# Patient Record
Sex: Female | Born: 1935 | Race: White | Hispanic: No | Smoking: Former smoker
Health system: Southern US, Community
[De-identification: ages and names within clinical notes are randomized; demographics above are authoritative.]

## PROBLEM LIST (undated history)

## (undated) DIAGNOSIS — K469 Unspecified abdominal hernia without obstruction or gangrene: Secondary | ICD-10-CM

## (undated) DIAGNOSIS — E785 Hyperlipidemia, unspecified: Secondary | ICD-10-CM

## (undated) DIAGNOSIS — Z8679 Personal history of other diseases of the circulatory system: Secondary | ICD-10-CM

## (undated) DIAGNOSIS — R0600 Dyspnea, unspecified: Secondary | ICD-10-CM

## (undated) DIAGNOSIS — D649 Anemia, unspecified: Secondary | ICD-10-CM

## (undated) DIAGNOSIS — M810 Age-related osteoporosis without current pathological fracture: Secondary | ICD-10-CM

## (undated) DIAGNOSIS — K219 Gastro-esophageal reflux disease without esophagitis: Secondary | ICD-10-CM

## (undated) DIAGNOSIS — M069 Rheumatoid arthritis, unspecified: Secondary | ICD-10-CM

## (undated) DIAGNOSIS — R269 Unspecified abnormalities of gait and mobility: Secondary | ICD-10-CM

## (undated) DIAGNOSIS — N3020 Other chronic cystitis without hematuria: Secondary | ICD-10-CM

## (undated) DIAGNOSIS — F17200 Nicotine dependence, unspecified, uncomplicated: Secondary | ICD-10-CM

## (undated) DIAGNOSIS — R251 Tremor, unspecified: Secondary | ICD-10-CM

## (undated) DIAGNOSIS — M25529 Pain in unspecified elbow: Secondary | ICD-10-CM

## (undated) DIAGNOSIS — G609 Hereditary and idiopathic neuropathy, unspecified: Secondary | ICD-10-CM

## (undated) DIAGNOSIS — M1990 Unspecified osteoarthritis, unspecified site: Secondary | ICD-10-CM

## (undated) DIAGNOSIS — M21371 Foot drop, right foot: Secondary | ICD-10-CM

## (undated) DIAGNOSIS — M549 Dorsalgia, unspecified: Secondary | ICD-10-CM

## (undated) DIAGNOSIS — I10 Essential (primary) hypertension: Secondary | ICD-10-CM

## (undated) DIAGNOSIS — J449 Chronic obstructive pulmonary disease, unspecified: Secondary | ICD-10-CM

## (undated) DIAGNOSIS — G309 Alzheimer's disease, unspecified: Secondary | ICD-10-CM

## (undated) DIAGNOSIS — G252 Other specified forms of tremor: Principal | ICD-10-CM

## (undated) DIAGNOSIS — I739 Peripheral vascular disease, unspecified: Secondary | ICD-10-CM

## (undated) DIAGNOSIS — J4489 Other specified chronic obstructive pulmonary disease: Secondary | ICD-10-CM

## (undated) DIAGNOSIS — E559 Vitamin D deficiency, unspecified: Secondary | ICD-10-CM

## (undated) DIAGNOSIS — R209 Unspecified disturbances of skin sensation: Secondary | ICD-10-CM

## (undated) DIAGNOSIS — F0280 Dementia in other diseases classified elsewhere without behavioral disturbance: Secondary | ICD-10-CM

## (undated) DIAGNOSIS — R6889 Other general symptoms and signs: Secondary | ICD-10-CM

## (undated) DIAGNOSIS — F419 Anxiety disorder, unspecified: Secondary | ICD-10-CM

## (undated) DIAGNOSIS — G250 Essential tremor: Secondary | ICD-10-CM

## (undated) DIAGNOSIS — F329 Major depressive disorder, single episode, unspecified: Secondary | ICD-10-CM

## (undated) DIAGNOSIS — K649 Unspecified hemorrhoids: Secondary | ICD-10-CM

## (undated) DIAGNOSIS — F32A Depression, unspecified: Secondary | ICD-10-CM

## (undated) DIAGNOSIS — G8929 Other chronic pain: Secondary | ICD-10-CM

## (undated) DIAGNOSIS — M21372 Foot drop, left foot: Secondary | ICD-10-CM

## (undated) HISTORY — DX: Rheumatoid arthritis, unspecified: M06.9

## (undated) HISTORY — DX: Pain in unspecified elbow: M25.529

## (undated) HISTORY — DX: Dorsalgia, unspecified: M54.9

## (undated) HISTORY — DX: Dementia in other diseases classified elsewhere without behavioral disturbance: F02.80

## (undated) HISTORY — PX: TONSILLECTOMY: SUR1361

## (undated) HISTORY — PX: APPENDECTOMY: SHX54

## (undated) HISTORY — DX: Other chronic cystitis without hematuria: N30.20

## (undated) HISTORY — DX: Chronic obstructive pulmonary disease, unspecified: J44.9

## (undated) HISTORY — DX: Unspecified disturbances of skin sensation: R20.9

## (undated) HISTORY — DX: Unspecified abnormalities of gait and mobility: R26.9

## (undated) HISTORY — DX: Foot drop, right foot: M21.371

## (undated) HISTORY — DX: Gastro-esophageal reflux disease without esophagitis: K21.9

## (undated) HISTORY — DX: Unspecified abdominal hernia without obstruction or gangrene: K46.9

## (undated) HISTORY — DX: Other specified forms of tremor: G25.2

## (undated) HISTORY — DX: Nicotine dependence, unspecified, uncomplicated: F17.200

## (undated) HISTORY — DX: Dyspnea, unspecified: R06.00

## (undated) HISTORY — DX: Other general symptoms and signs: R68.89

## (undated) HISTORY — DX: Age-related osteoporosis without current pathological fracture: M81.0

## (undated) HISTORY — DX: Alzheimer's disease, unspecified: G30.9

## (undated) HISTORY — DX: Unspecified osteoarthritis, unspecified site: M19.90

## (undated) HISTORY — PX: INGUINAL HERNIA REPAIR: SUR1180

## (undated) HISTORY — DX: Essential tremor: G25.0

## (undated) HISTORY — DX: Tremor, unspecified: R25.1

## (undated) HISTORY — DX: Foot drop, left foot: M21.372

## (undated) HISTORY — DX: Hereditary and idiopathic neuropathy, unspecified: G60.9

## (undated) HISTORY — DX: Major depressive disorder, single episode, unspecified: F32.9

## (undated) HISTORY — DX: Unspecified hemorrhoids: K64.9

## (undated) HISTORY — PX: BACK SURGERY: SHX140

## (undated) HISTORY — DX: Hyperlipidemia, unspecified: E78.5

## (undated) HISTORY — DX: Personal history of other diseases of the circulatory system: Z86.79

## (undated) HISTORY — PX: OTHER SURGICAL HISTORY: SHX169

## (undated) HISTORY — PX: CATARACT EXTRACTION: SUR2

## (undated) HISTORY — DX: Other specified chronic obstructive pulmonary disease: J44.89

## (undated) HISTORY — DX: Depression, unspecified: F32.A

## (undated) HISTORY — DX: Essential (primary) hypertension: I10

## (undated) HISTORY — DX: Anemia, unspecified: D64.9

## (undated) HISTORY — DX: Anxiety disorder, unspecified: F41.9

## (undated) HISTORY — DX: Other chronic pain: G89.29

## (undated) HISTORY — DX: Vitamin D deficiency, unspecified: E55.9

## (undated) HISTORY — DX: Peripheral vascular disease, unspecified: I73.9

---

## 1998-05-02 ENCOUNTER — Other Ambulatory Visit: Admission: RE | Admit: 1998-05-02 | Discharge: 1998-05-02 | Payer: Self-pay | Admitting: Obstetrics & Gynecology

## 1999-04-11 ENCOUNTER — Other Ambulatory Visit: Admission: RE | Admit: 1999-04-11 | Discharge: 1999-04-11 | Payer: Self-pay | Admitting: Obstetrics & Gynecology

## 1999-08-07 ENCOUNTER — Ambulatory Visit (HOSPITAL_COMMUNITY): Admission: RE | Admit: 1999-08-07 | Discharge: 1999-08-07 | Payer: Self-pay | Admitting: Gastroenterology

## 2000-04-21 ENCOUNTER — Other Ambulatory Visit: Admission: RE | Admit: 2000-04-21 | Discharge: 2000-04-21 | Payer: Self-pay | Admitting: Obstetrics & Gynecology

## 2002-01-08 ENCOUNTER — Encounter: Payer: Self-pay | Admitting: Family Medicine

## 2002-01-08 ENCOUNTER — Encounter: Admission: RE | Admit: 2002-01-08 | Discharge: 2002-01-08 | Payer: Self-pay | Admitting: Family Medicine

## 2002-04-22 ENCOUNTER — Other Ambulatory Visit: Admission: RE | Admit: 2002-04-22 | Discharge: 2002-04-22 | Payer: Self-pay | Admitting: Obstetrics & Gynecology

## 2002-10-26 ENCOUNTER — Encounter: Payer: Self-pay | Admitting: General Surgery

## 2002-10-29 ENCOUNTER — Inpatient Hospital Stay (HOSPITAL_COMMUNITY): Admission: RE | Admit: 2002-10-29 | Discharge: 2002-10-30 | Payer: Self-pay | Admitting: General Surgery

## 2003-10-11 ENCOUNTER — Ambulatory Visit: Admission: RE | Admit: 2003-10-11 | Discharge: 2003-10-11 | Payer: Self-pay

## 2004-05-02 ENCOUNTER — Encounter: Admission: RE | Admit: 2004-05-02 | Discharge: 2004-06-12 | Payer: Self-pay | Admitting: Family Medicine

## 2004-05-11 ENCOUNTER — Other Ambulatory Visit: Admission: RE | Admit: 2004-05-11 | Discharge: 2004-05-11 | Payer: Self-pay | Admitting: Obstetrics & Gynecology

## 2004-06-20 ENCOUNTER — Encounter: Admission: RE | Admit: 2004-06-20 | Discharge: 2004-06-20 | Payer: Self-pay | Admitting: Family Medicine

## 2006-12-28 ENCOUNTER — Other Ambulatory Visit: Admission: RE | Admit: 2006-12-28 | Discharge: 2006-12-28 | Payer: Self-pay | Admitting: Obstetrics and Gynecology

## 2007-07-14 ENCOUNTER — Inpatient Hospital Stay (HOSPITAL_COMMUNITY): Admission: AD | Admit: 2007-07-14 | Discharge: 2007-07-17 | Payer: Self-pay | Admitting: Internal Medicine

## 2007-07-15 ENCOUNTER — Encounter (INDEPENDENT_AMBULATORY_CARE_PROVIDER_SITE_OTHER): Payer: Self-pay | Admitting: Internal Medicine

## 2007-07-15 ENCOUNTER — Ambulatory Visit: Payer: Self-pay | Admitting: Surgery

## 2008-10-26 ENCOUNTER — Encounter: Admission: RE | Admit: 2008-10-26 | Discharge: 2008-10-26 | Payer: Self-pay | Admitting: Family Medicine

## 2008-12-02 ENCOUNTER — Encounter: Admission: RE | Admit: 2008-12-02 | Discharge: 2008-12-02 | Payer: Self-pay | Admitting: Rheumatology

## 2009-06-06 ENCOUNTER — Other Ambulatory Visit: Admission: RE | Admit: 2009-06-06 | Discharge: 2009-06-06 | Payer: Self-pay | Admitting: Obstetrics and Gynecology

## 2009-10-11 ENCOUNTER — Encounter: Payer: Self-pay | Admitting: Internal Medicine

## 2009-11-29 ENCOUNTER — Encounter: Payer: Self-pay | Admitting: Internal Medicine

## 2009-12-12 ENCOUNTER — Encounter: Payer: Self-pay | Admitting: Internal Medicine

## 2010-02-20 ENCOUNTER — Encounter: Payer: Self-pay | Admitting: Internal Medicine

## 2010-03-27 ENCOUNTER — Ambulatory Visit
Admission: RE | Admit: 2010-03-27 | Discharge: 2010-03-27 | Payer: Self-pay | Source: Home / Self Care | Attending: Internal Medicine | Admitting: Internal Medicine

## 2010-03-27 ENCOUNTER — Other Ambulatory Visit: Payer: Self-pay | Admitting: Internal Medicine

## 2010-03-27 DIAGNOSIS — R0602 Shortness of breath: Secondary | ICD-10-CM | POA: Insufficient documentation

## 2010-03-27 LAB — CBC WITH DIFFERENTIAL/PLATELET
Basophils Absolute: 0.1 10*3/uL (ref 0.0–0.1)
Basophils Relative: 0.7 % (ref 0.0–3.0)
Eosinophils Absolute: 0.2 10*3/uL (ref 0.0–0.7)
Eosinophils Relative: 2.4 % (ref 0.0–5.0)
HCT: 41.1 % (ref 36.0–46.0)
Hemoglobin: 14 g/dL (ref 12.0–15.0)
Lymphocytes Relative: 24.5 % (ref 12.0–46.0)
Lymphs Abs: 1.7 10*3/uL (ref 0.7–4.0)
MCHC: 34.1 g/dL (ref 30.0–36.0)
MCV: 91.1 fl (ref 78.0–100.0)
Monocytes Absolute: 0.7 10*3/uL (ref 0.1–1.0)
Monocytes Relative: 9.7 % (ref 3.0–12.0)
Neutro Abs: 4.3 10*3/uL (ref 1.4–7.7)
Neutrophils Relative %: 62.7 % (ref 43.0–77.0)
Platelets: 247 10*3/uL (ref 150.0–400.0)
RBC: 4.52 Mil/uL (ref 3.87–5.11)
RDW: 15.7 % — ABNORMAL HIGH (ref 11.5–14.6)
WBC: 6.8 10*3/uL (ref 4.5–10.5)

## 2010-03-30 ENCOUNTER — Ambulatory Visit
Admission: RE | Admit: 2010-03-30 | Discharge: 2010-03-30 | Payer: Self-pay | Source: Home / Self Care | Attending: Internal Medicine | Admitting: Internal Medicine

## 2010-03-30 ENCOUNTER — Encounter: Payer: Self-pay | Admitting: Internal Medicine

## 2010-03-30 DIAGNOSIS — I2789 Other specified pulmonary heart diseases: Secondary | ICD-10-CM | POA: Insufficient documentation

## 2010-04-05 ENCOUNTER — Telehealth (INDEPENDENT_AMBULATORY_CARE_PROVIDER_SITE_OTHER): Payer: Self-pay | Admitting: *Deleted

## 2010-04-10 ENCOUNTER — Telehealth (INDEPENDENT_AMBULATORY_CARE_PROVIDER_SITE_OTHER): Payer: Self-pay | Admitting: *Deleted

## 2010-04-19 NOTE — Miscellaneous (Signed)
Summary: Orders Update pft charges  Clinical Lists Changes  Orders: Added new Service order of Carbon Monoxide diffusing w/capacity (94720) - Signed Added new Service order of Lung Volumes (94240) - Signed Added new Service order of Spirometry (Pre & Post) (94060) - Signed 

## 2010-04-19 NOTE — Assessment & Plan Note (Signed)
Summary: discuss pft and cxr results//jrc   Visit Type:  Follow-up Copy to:  Dr Juluis Rainier Primary Provider/Referring Provider:  Dr. Juluis Rainier, [employer]  CC:  Pt here to discuss PFT results. .  History of Present Illness: March 27, 2010  IOV for this 77 year old lady. Heavy passive smoker till 2007. c/o dyspnea x since fall 2011. Insidious onset. Progressive initially but last 2-3 months been stable. Dyspnea brought on by "hurrying" or "rush to do anything". For example if she walks brisker than normal. In the office she felt dyspneic after walking 155 feet x 2 laps - she did not desaturate but got tachycardidc to tthe 120s (sinus by pulse).Marland Kitchen Dyspnea relieved by rest. Dyspnea is associated some fain wheeze early in the morning. It is also associated  with "rapid heart beat". She noticed rapid heart beat in Nov 2011.Marland KitchenDyspnea is also associatd with loss of 6 inches of height due to age-osteoprosis. Height was  5'4" but now only 4'8". Dyspnea is rated as mild - moderate. Denies associated chest pain, hemoptysis, weight loss, cough, runny nose, change in edema (known to have varicose veins), orthopnea, paroxysmal nocturnal dyspnea, nausea, vomit, diarrhea.     Review of outside records show that she had ekg reported as sinus rhythm. She  has seen Dr. Eldridge Dace of [facility] and echo reportedly showed some pulmonary hypertension reportedly. She [facility] had [facility] in 2004 at [facility] and per report it was normal. Outside records report a more recent normal CXR of unclear date. Pertinent las she had were BNP 88 in sept 2011 adn normal TSH and creatinine in July 2011. Of note, per echart in 2009 admitted with hgb 6.5gm% due to GI blood loss. Discharge hgb was 9s gm%. States she has not had repepat hgb since then but outside records show hgb 13.8gm% in July 2011; this pre-dated dyspnea onset. REC: CBC, CXR nd FULL PFT   March 30, 2010: here to reviewe test results. No interim new  complatins. Stil dyspneic. No change. Hgb is 14gm%. CXR shows HIATAL HERNIA and SEVERE KYPHOSIS with resultant small lung volumes and left diaphragm elevation. PFTs today are normal   Preventive Screening-Counseling & Management  Alcohol-Tobacco     Smoking Status: never     Passive Smoke Exposure: yes  Current Medications (verified): 1)  Tylenol Arthritis Pain 650 Mg Cr-Tabs (Acetaminophen) .... Take 1 Tablet By Mouth Once A Day 2)  Osteo Bi-Flex Adv Joint Shield  Tabs (Misc Natural Products) .... 2 Tablets Once Daily 3)  Prilosec 20 Mg Cpdr (Omeprazole) .... Take 1 Tablet By Mouth Once A Day 4)  Multivitamins  Tabs (Multiple Vitamin) .... Take 1 Tablet By Mouth Once A Day 5)  Vitamin D 1000 Unit Tabs (Cholecalciferol) .... Take 1 Tablet By Mouth Once A Day 6)  Fish Oil 1000 Mg Caps (Omega-3 Fatty Acids) .... Take 1 Tablet By Mouth Once A Day 7)  Caltrate 600+d Plus 600-400 Mg-Unit Tabs (Calcium Carbonate-Vit D-Min) .... Take 1 Tablet By Mouth Two Times A Day 8)  Hydrocortisone 2.5 % Crea (Hydrocortisone) .... Apply Once A Day 9)  Voltaren 1 % Gel (Diclofenac Sodium) .... As Directed Twice Daily As Needed 10)  Estrace 0.1 Mg/gm Crea (Estradiol) .... One Applicator Monday, Wed, Friday 11)  Hydrochlorothiazide 12.5 Mg Caps (Hydrochlorothiazide) .... Take 1 Tablet By Mouth Once A Day 12)  Zoloft 50 Mg Tabs (Sertraline Hcl) .... Take 1 Tablet By Mouth Once A Day 13)  Vicodin 5-500  Mg Tabs (Hydrocodone-Acetaminophen) .... One Tablet As Needed For Pain Every 6 Hrs  Allergies (verified): 1)  ! * Novocaine  Past History:  Past medical, surgical, family and social histories (including risk factors) reviewed, and no changes noted (except as noted below).  Past Medical History: Reviewed history from 03/27/2010 and no changes required. Hypertension Osteoarthritis Osteoporosis Depression GERD Hyperlipidemia Anemia Anxiety Peripheral vascular disease Hemorrhoids Cerebrovascular  disease esophageal reflux hernia Normal CXR by report 10/26/2002 Varicose vein  - August 2010 doppler negative for both SVT and DVT  Past Surgical History: Reviewed history from 03/26/2010 and no changes required. Appendectomy Total Abdominal Hysterectomy 1995 back surgery x2 has titanium rods in back, 2003, 2007 Right breast lumpectomy-benign 1964, 1975 hernia repair-2004  Past Pulmonary History:  Pulmonary History: DATE OF ADMISSION:  07/14/2007   DATE OF DISCHARGE:  07/17/2007     1. Severe iron-deficiency anemia likely secondary to large hiatal      hernia and Cameron erosions. admit hgb 6.5gm%. DC hgb 9s gm%  2. Right carotid bruit.   3. Hypertension.   4. Diverticulosis.   5. Depression.   6. Osteoporosis.   7. Hyperlipidemia.   8. Degenerative joint disease.   9. Ventral hernia status post repair August 2004.   10.Hemangioma.   11.Chronic back pain.   12.External hemorrhoids.      Family History: Reviewed history from 03/27/2010 and no changes required. Father-deceased 59 yrs MI Mother-deceased Leukemia Brother-1  COPD. Died 01/22/11Sister-1 deceased brain tumor  Social History: Reviewed history from 03/27/2010 and no changes required. Patient never smoked.  Pt lives alone widowed Pt has 3 children, 2 boys and one daughter  - Vivia Budge, [place] ; [place] and [place] Lives in retirement home -- independent living in an apartment  Review of Systems      See HPI       The patient complains of dyspnea on exertion.  The patient denies anorexia, fever, weight loss, weight gain, vision loss, decreased hearing, hoarseness, chest pain, syncope, peripheral edema, prolonged cough, headaches, hemoptysis, abdominal pain, melena, hematochezia, severe indigestion/heartburn, hematuria, incontinence, genital sores, muscle weakness, suspicious skin lesions, transient blindness, difficulty walking, depression, unusual weight change, abnormal bleeding, enlarged lymph  nodes, angioedema, breast masses, and testicular masses.    Vital Signs:  Patient profile:   77 year old female Height:      58.5 inches O2 Sat:      94 % on Room air Temp:     98.2 degrees F oral Pulse rate:   104 / minute BP sitting:   136 / 90  (right arm) Cuff size:   regular  Vitals Entered By: Carron Curie CMA (March 30, 2010 1:24 PM)  O2 Flow:  Room air CC: Pt here to discuss PFT results.  Comments Medications reviewed with patient Carron Curie CMA  March 30, 2010 1:25 PM Daytime phone number verified with patient.    Physical Exam  General:  short,  Head:  normocephalic and atraumatic Eyes:  PERRLA/EOM intact; conjunctiva and sclera clear Ears:  TMs intact and clear with normal canals Nose:  no deformity, discharge, inflammation, or lesions Mouth:  no deformity or lesions Neck:  no masses, thyromegaly, or abnormal cervical nodes Chest Wall:  kyphosis.   Lungs:  clear bilaterally to auscultation and percussion Heart:  regular rate and rhythm, S1, S2 without murmurs, rubs, gallops, or clicks Abdomen:  bowel sounds positive; abdomen soft and non-tender without masses, or organomegaly Msk:  no deformity or  scoliosis noted with normal posture osteoporotic Pulses:  pulses normal Extremities:  no clubbing, cyanosis, edema, or deformity noted Neurologic:  CN II-XII grossly intact with normal reflexes, coordination, muscle strength and tone Skin:  intact without lesions or rashes Cervical Nodes:  no significant adenopathy Axillary Nodes:  no significant adenopathy Psych:  alert and cooperative; normal mood and affect; normal attention span and concentration   MISC. Report  Procedure date:  03/30/2010  Findings:       Hgb is 14gm%. CXR shows HIATAL HERNIA and SEVERE KYPHOSIS with resultant small lung volumes and left diaphragm elevation. PFTs today are normal   Impression & Recommendations:  Problem # 1:  DYSPNEA (ICD-786.05) Assessment  Unchanged PFTs are normal. Walking desaturatin test is normal. Hgb is normal. CXR shows severe kyphosis and hiatral hernia. ECHO shows secondary puml htn. I suspect therefore dyspnea is due to deconditioning, kyphosis, and possible secondary pulmonary hypertension. THere is no specific Rx intervention for this. I strongly advised pulmonary rehab. She has accepted referral but will want to talk to rehab people before she decides to go through rehab or accept dyspnea. She will see me again in 3 months to report progres Orders: Est. Patient Level III (16109) Pulmonary Referral (Pulmonary)  Patient Instructions: 1)  please discuss with rehab when they call you 2)  if you can attend the program there would be great 3)  your shortness of breath is due to loss of height and deconditioning  4)  please d/w Dr. Zachery Dauer about medications for osteoporosis 5)  pleaes return in 3 months to report progress 6)  if any problems before next visit, come sooner

## 2010-04-19 NOTE — Progress Notes (Signed)
Summary: calling about referral for therapy  Phone Note Call from Patient Call back at Home Phone [phone]   Caller: Patient Call For: Dr. Marchelle Gearing Summary of Call: Patient was here on the 13th and our office was going to set up an appointment for therapy and she has not heard back yet. She received a letter from [facility] inquiring on how things went but she hasnt heard back from our office and doesnt know if it has been scheduled. Patient can be reached at [phone] Initial call taken by: Vedia Coffer,  April 10, 2010 9:38 AM  Follow-up for Phone Call        Spoke with pt.  She states that she has not heard anything from pulmonary rehab at [facility].  I advised that we faxed referal and pt records to them on 03/30/10.  I called and spoke with [facility] in the rehab dept and she states that she is unsure about this and will have Jamesetta So, who takes care of referals give the pt a call this pm.  Pt aware. Follow-up by: Vernie Murders,  April 10, 2010 10:10 AM

## 2010-04-19 NOTE — Letter (Signed)
Summary: [name] at [facility] at [facility]   Imported By: Lester [place] 04/02/2010 10:03:55  _____________________________________________________________________  External Attachment:    Type:   Image     Comment:   External Document

## 2010-04-19 NOTE — Letter (Signed)
Summary: Everette Rank MD/[facility]  Everette Rank MD/[facility]   Imported By: Lester [place] 04/02/2010 10:05:19  _____________________________________________________________________  External Attachment:    Type:   Image     Comment:   External Document

## 2010-04-19 NOTE — Assessment & Plan Note (Signed)
Summary: dysnea//jwr   Visit Type:  Initial Consult Copy to:  Dr Juluis Rainier Primary Provider/Referring Provider:  Dr. Juluis Rainier, [employer]  CC:   Consult for SOB with exertion x 1 year. Marland Kitchen  History of Present Illness: March 27, 2010  IOV for this 77 year old lady. Heavy passive smoker till Aug 13, 2005. c/o dyspnea x since fall Aug 13, 2009. Insidious onset. Progressive initially but last 2-3 months been stable. Dyspnea brought on by "hurrying" or "rush to do anything". For example if she walks brisker than normal. In the office she felt dyspneic after walking 155 feet x 2 laps - she did not desaturate but got tachycardidc to tthe 120s (sinus by pulse).Marland Kitchen Dyspnea relieved by rest. Dyspnea is associated some fain wheeze early in the morning. It is also associated  with "rapid heart beat". She noticed rapid heart beat in Nov 2011.Marland KitchenDyspnea is also associatd with loss of 6 inches of height due to age-osteoprosis. Height was  5'4" but now only 4'8". Dyspnea is rated as mild - moderate. Denies associated chest pain, hemoptysis, weight loss, cough, runny nose, change in edema (known to have varicose veins), orthopnea, paroxysmal nocturnal dyspnea, nausea, vomit, diarrhea.     Review of outside records show that she had ekg reported as sinus rhythm. She  has seen Dr. Eldridge Dace of [facility] and echo reportedly showed some pulmonary hypertension reportedly. She last had normal CXR in 2002-08-14 at [facility] and per report it was normal. Outside records report a more recent normal CXR of unclear date. Pertinent las she had were BNP 88 in sept 08/13/2009 adn normal TSH and creatinine in July 2011. Of note, per echart in 08/14/07 admitted with hgb 6.5gm% due to GI blood loss. Discharge hgb was 9s gm%. States she has not had repepat hgb since then but outside records show hgb 13.8gm% in July 2011; this pre-dated dyspnea onset.   Preventive Screening-Counseling & Management  Alcohol-Tobacco     Smoking Status: never     Passive  Smoke Exposure: yes  Comments: husband smoked. She was married to him for 62 years. He died in 08-13-2005.  Her father also smoked. She was exposed to his passive smoke 0 to age 84 years.   Current Medications (verified): 1)  Tylenol Arthritis Pain 650 Mg Cr-Tabs (Acetaminophen) .... Take 1 Tablet By Mouth Once A Day 2)  Osteo Bi-Flex Adv Joint Shield  Tabs (Misc Natural Products) .... 2 Tablets Once Daily 3)  Prilosec 20 Mg Cpdr (Omeprazole) .... Take 1 Tablet By Mouth Once A Day 4)  Multivitamins  Tabs (Multiple Vitamin) .... Take 1 Tablet By Mouth Once A Day 5)  Vitamin D 1000 Unit Tabs (Cholecalciferol) .... Take 1 Tablet By Mouth Once A Day 6)  Fish Oil 1000 Mg Caps (Omega-3 Fatty Acids) .... Take 1 Tablet By Mouth Once A Day 7)  Caltrate 600+d Plus 600-400 Mg-Unit Tabs (Calcium Carbonate-Vit D-Min) .... Take 1 Tablet By Mouth Two Times A Day 8)  Hydrocortisone 2.5 % Crea (Hydrocortisone) .... Apply Once A Day 9)  Voltaren 1 % Gel (Diclofenac Sodium) .... As Directed Twice Daily As Needed 10)  Estrace 0.1 Mg/gm Crea (Estradiol) .... One Applicator Monday, Wed, Friday 11)  Hydrochlorothiazide 12.5 Mg Caps (Hydrochlorothiazide) .... Take 1 Tablet By Mouth Once A Day 12)  Zoloft 50 Mg Tabs (Sertraline Hcl) .... Take 1 Tablet By Mouth Once A Day 13)  Vicodin 5-500 Mg Tabs (Hydrocodone-Acetaminophen) .... One Tablet As Needed For Pain Every 6 Hrs  Allergies (verified): 1)  ! * Novocaine  Past History:  Past Medical History: Hypertension Osteoarthritis Osteoporosis Depression GERD Hyperlipidemia Anemia Anxiety Peripheral vascular disease Hemorrhoids Cerebrovascular disease esophageal reflux hernia Normal CXR by report 10/26/2002 Varicose vein  - August 2010 doppler negative for both SVT and DVT  Past Pulmonary History:  Pulmonary History: DATE OF ADMISSION:  07/14/2007   DATE OF DISCHARGE:  07/17/2007     1. Severe iron-deficiency anemia likely secondary to large hiatal       hernia and Cameron erosions. admit hgb 6.5gm%. DC hgb 9s gm%  2. Right carotid bruit.   3. Hypertension.   4. Diverticulosis.   5. Depression.   6. Osteoporosis.   7. Hyperlipidemia.   8. Degenerative joint disease.   9. Ventral hernia status post repair August 2004.   10.Hemangioma.   11.Chronic back pain.   12.External hemorrhoids.      Family History: Father-deceased 59 yrs MI Mother-deceased Leukemia Brother-1  COPD. Died 01/20/11Sister-1 deceased brain tumor  Social History: Patient never smoked.  Pt lives alone widowed Pt has 3 children, 2 boys and one daughter  - Vivia Budge, [place] ; [place] and [place] Lives in retirement home -- independent living in an apartmentPassive Smoke Exposure:  yes  Review of Systems       The patient complains of shortness of breath with activity.  The patient denies shortness of breath at rest, productive cough, non-productive cough, coughing up blood, chest pain, irregular heartbeats, acid heartburn, indigestion, loss of appetite, weight change, abdominal pain, difficulty swallowing, sore throat, tooth/dental problems, headaches, nasal congestion/difficulty breathing through nose, sneezing, itching, ear ache, anxiety, depression, hand/feet swelling, joint stiffness or pain, rash, change in color of mucus, and fever.    Vital Signs:  Patient profile:   77 year old female Height:      58.5 inches Weight:      135.50 pounds BMI:     27.94 O2 Sat:      95 % on Room air Temp:     98.6 degrees F oral Pulse rate:   117 / minute BP sitting:   118 / 62  (right arm) Cuff size:   regular  Vitals Entered By: Carron Curie CMA (March 27, 2010 1:35 PM)  O2 Flow:  Room air CC:  Consult for SOB with exertion x 1 year.  Comments Medications reviewed with patient Carron Curie CMA  March 27, 2010 1:37 PM Daytime phone number verified with patient.    Physical Exam  General:  short,  Head:  normocephalic and atraumatic Eyes:   PERRLA/EOM intact; conjunctiva and sclera clear Ears:  TMs intact and clear with normal canals Nose:  no deformity, discharge, inflammation, or lesions Mouth:  no deformity or lesions Neck:  no masses, thyromegaly, or abnormal cervical nodes Chest Wall:  kyphosis.   Lungs:  clear bilaterally to auscultation and percussion Heart:  regular rate and rhythm, S1, S2 without murmurs, rubs, gallops, or clicks Abdomen:  bowel sounds positive; abdomen soft and non-tender without masses, or organomegaly Msk:  no deformity or scoliosis noted with normal posture osteoporotic Pulses:  pulses normal Extremities:  no clubbing, cyanosis, edema, or deformity noted Neurologic:  CN II-XII grossly intact with normal reflexes, coordination, muscle strength and tone Skin:  intact without lesions or rashes Cervical Nodes:  no significant adenopathy Axillary Nodes:  no significant adenopathy Psych:  alert and cooperative; normal mood and affect; normal attention span and concentration   MISC. Report  Procedure  date:  03/27/2010  Findings:      Outside records report a more recent normal CXR of unclear date. Pertinent las she had were BNP 88 in sept 2011 adn normal TSH and creatinine in July 2011. Of note, per echart in 2009 admitted with hgb 6.5gm% due to GI blood loss. Discharge hgb was 9s gm%. States she has not had repepat hgb since then but outside records show hgb 13.8gm% in July 2011; this pre-dated dyspnea onset.   Impression & Recommendations:  Problem # 1:  DYSPNEA (ICD-786.05) Assessment New Unclear why she is dyspneic. DDx includes loss of height (6 inches) due to osteoprosis, passive smoking related intrinsic lung disease like COPD, secondary pulmonary hypertension due to the above (as seen in ECHO), deconditioninng or Iron Def anemia (though last hgb pre-dating dyspnea was normal). The sinus tachycardia can be reflective of the above etiologies.   PLAN Get full PFT Get CXR recheck  hgb reassess after above via phone and decide next step  Orders: TLB-CBC Platelet - w/Differential (85025-CBCD) T-2 View CXR (71020TC) Pulmonary Referral (Pulmonary) Consultation Level V (42595)  Patient Instructions: 1)  unclear why you are short of breath 2)  it is possible you are short of breath due to loss of height 3)  I would recommend 4)   - CXR today because we do not know when last one ws 5)   - hemoglobin check today 6)   - full breathing test 7)  I will call you with results of these tests and then discuss next step   Immunization History:  Influenza Immunization History:    Influenza:  historical (01/16/2010)  Pneumovax Immunization History:    Pneumovax:  historical (01/21/2006)  Appended Document: dysnea//jwr Ambulatory Pulse Oximetry  Resting; HR__99___    02 Sat__96___  Lap1 (185 feet)   HR__125___   02 Sat__95___ Lap2 (185 feet)   HR__112___   02 Sat_94____    Lap3 (185 feet)   HR___126__   02 Sat__94___  __x_Test Completed without Difficulty ___Test Stopped due to:   Appended Document: dysnea//jwr Dr Zachery Dauer, Patient is wanting to know about specific Rx for osteoporosis. Have suggested she talk to yo uabout it. I can be reachd [phone]

## 2010-04-19 NOTE — Progress Notes (Signed)
Summary: re: pt's height  Phone Note From Other Clinic   Caller: brittany w/ dr Zachery Dauer Call For: [facility] Summary of Call: re: pt's height. pt had a bone density in april 2011. her height is 58.7". (the 48" was a typo apparently).  pt diagnosed w/ osteopenia per brittany. if anything else is needed call [phone] Initial call taken by: Tivis Ringer, CNA,  April 05, 2010 4:17 PM

## 2010-04-19 NOTE — Letter (Signed)
Summary: [name] at [facility] at [facility]   Imported By: Lester [place] 04/02/2010 10:02:50  _____________________________________________________________________  External Attachment:    Type:   Image     Comment:   External Document

## 2010-04-19 NOTE — Letter (Signed)
Summary: [facility]ier [facility] MD[facility]   Imported By: Lester [place] 04/02/2010 10:07:17  _____________________________________________________________________  External Attachment:    Type:   Image     Comment:   External Document

## 2010-05-29 ENCOUNTER — Encounter (HOSPITAL_COMMUNITY): Payer: Medicare Other | Attending: Internal Medicine

## 2010-05-29 DIAGNOSIS — I10 Essential (primary) hypertension: Secondary | ICD-10-CM | POA: Insufficient documentation

## 2010-05-29 DIAGNOSIS — I739 Peripheral vascular disease, unspecified: Secondary | ICD-10-CM | POA: Insufficient documentation

## 2010-05-29 DIAGNOSIS — R0609 Other forms of dyspnea: Secondary | ICD-10-CM | POA: Insufficient documentation

## 2010-05-29 DIAGNOSIS — R0989 Other specified symptoms and signs involving the circulatory and respiratory systems: Secondary | ICD-10-CM | POA: Insufficient documentation

## 2010-05-29 DIAGNOSIS — Z5189 Encounter for other specified aftercare: Secondary | ICD-10-CM | POA: Insufficient documentation

## 2010-05-29 DIAGNOSIS — E785 Hyperlipidemia, unspecified: Secondary | ICD-10-CM | POA: Insufficient documentation

## 2010-05-29 DIAGNOSIS — K219 Gastro-esophageal reflux disease without esophagitis: Secondary | ICD-10-CM | POA: Insufficient documentation

## 2010-06-05 ENCOUNTER — Encounter (HOSPITAL_COMMUNITY): Payer: Medicare Other

## 2010-06-07 ENCOUNTER — Encounter (HOSPITAL_COMMUNITY): Payer: Medicare Other

## 2010-06-12 ENCOUNTER — Encounter (HOSPITAL_COMMUNITY): Payer: Medicare Other

## 2010-06-14 ENCOUNTER — Encounter (HOSPITAL_COMMUNITY): Payer: Medicare Other

## 2010-06-19 ENCOUNTER — Encounter (HOSPITAL_COMMUNITY): Payer: Medicare Other | Attending: Internal Medicine

## 2010-06-19 DIAGNOSIS — I739 Peripheral vascular disease, unspecified: Secondary | ICD-10-CM | POA: Insufficient documentation

## 2010-06-19 DIAGNOSIS — I10 Essential (primary) hypertension: Secondary | ICD-10-CM | POA: Insufficient documentation

## 2010-06-19 DIAGNOSIS — Z5189 Encounter for other specified aftercare: Secondary | ICD-10-CM | POA: Insufficient documentation

## 2010-06-19 DIAGNOSIS — E785 Hyperlipidemia, unspecified: Secondary | ICD-10-CM | POA: Insufficient documentation

## 2010-06-19 DIAGNOSIS — R0989 Other specified symptoms and signs involving the circulatory and respiratory systems: Secondary | ICD-10-CM | POA: Insufficient documentation

## 2010-06-19 DIAGNOSIS — K219 Gastro-esophageal reflux disease without esophagitis: Secondary | ICD-10-CM | POA: Insufficient documentation

## 2010-06-19 DIAGNOSIS — R0609 Other forms of dyspnea: Secondary | ICD-10-CM | POA: Insufficient documentation

## 2010-06-21 ENCOUNTER — Encounter (HOSPITAL_COMMUNITY): Payer: Medicare Other

## 2010-06-26 ENCOUNTER — Encounter (HOSPITAL_COMMUNITY): Payer: Medicare Other

## 2010-06-28 ENCOUNTER — Encounter (HOSPITAL_COMMUNITY): Payer: Medicare Other

## 2010-07-03 ENCOUNTER — Encounter (HOSPITAL_COMMUNITY): Payer: Medicare Other

## 2010-07-05 ENCOUNTER — Encounter (HOSPITAL_COMMUNITY): Payer: Medicare Other

## 2010-07-10 ENCOUNTER — Encounter (HOSPITAL_COMMUNITY): Payer: Medicare Other

## 2010-07-12 ENCOUNTER — Encounter (HOSPITAL_COMMUNITY): Payer: Medicare Other

## 2010-07-17 ENCOUNTER — Encounter (HOSPITAL_COMMUNITY): Payer: Medicare Other

## 2010-07-19 ENCOUNTER — Encounter (HOSPITAL_COMMUNITY): Payer: Medicare Other

## 2010-07-24 ENCOUNTER — Encounter (HOSPITAL_COMMUNITY): Payer: Medicare Other

## 2010-07-26 ENCOUNTER — Encounter (HOSPITAL_COMMUNITY): Payer: Medicare Other

## 2010-07-31 ENCOUNTER — Encounter (HOSPITAL_COMMUNITY): Payer: Medicare Other

## 2010-07-31 NOTE — Consult Note (Signed)
[name], [name] NO.:  [id number]   MEDICAL RECORD NO.:  [id number]          PATIENT TYPE:  INP   LOCATION:  1436                         FACILITY:  [facility]   PHYSICIAN:  Graylin Shiver, M.D.   DATE OF BIRTH:  1925/01/19   DATE OF CONSULTATION:  DATE OF DISCHARGE:                                 CONSULTATION   We were asked to see [name] today in consultation for anemia by Dr.  Ramiro Harvest.   HISTORY OF PRESENT ILLNESS:  This is a very pleasant 77 year old female  who has a history of anemia many years ago.  She was admitted today with  a hemoglobin of 6.5 and an MCV value of 64.  She reports occasional  problems with constipation and with bright red blood on her toilet  tissue.  She is irritated by one external hemorrhoid now.  She denies  black stools.  She denies anorexia, weight loss.  Denies vomiting.  She  does tell me that she has regular heartburn that is somewhat better on  Prilosec.  She has been taking 4 Advil per day for increased back pain  for a while now.   The patient's PCP is Dr. Henrine Screws.  Her GI physician is Dr. Carman Ching.   PAST MEDICAL HISTORY:  Hypertension, hyperlipidemia, osteoporosis,  degenerative joint disease, varicose veins, depression, a liver cyst -  hemangioma?  Ventral hernia.   SURGERIES:  Hysterectomy, back surgery, appendectomy, ventral hernia  repair.   The patient has had 2 colonoscopies, the first in 2001 and the second in  2008 with Dr. Carman Ching.  Her second colonoscopy revealed sigmoid  diverticulosis and moderate-sized internal hemorrhoids.   CURRENT MEDICATIONS:  Include HCTZ, Zoloft, Vivelle, Vicodin, Robaxin,  Advil, Calcitrate, glucosamine, Prilosec, and a multivitamin.   ALLERGIES:  She has an allergy to NOVOCAIN.   FAMILY HISTORY:  Significant for ulcers in her mother.  Negative for  colon cancer.   SOCIAL HISTORY:  Negative for alcohol, tobacco and drug use.   REVIEW OF  SYSTEMS:  Positive for fatigue, dyspnea on exertion and  chronic back pain.   PHYSICAL EXAMINATION:  GENERAL:  She is alert and oriented, in no  apparent distress.  VITAL SIGNS:  Her temperature is 98.6, pulse 74, respirations 18, blood  pressure 143/74.  CARDIOVASCULAR:  Cardiovascular system has a regular rate and rhythm.  LUNGS:  Clear to auscultation anteriorly.  ABDOMEN:  Soft, nontender, nondistended with good bowel sounds.   On labs, she had a hemoglobin after 2 units of 9.3, hematocrit 24.9.  Yesterday she had a PT of 14.6, INR 1.1, BUN 19, creatinine 0.87.  Her  LFTs are normal.  Her iron is less than 10.  TIBC could not be  calculated.  Again, she is guaiac-positive.   ASSESSMENT:  Dr. Herbert Moors has seen and examined the patient,  collected a history and reviewed her chart.  His impression is that she  has heme-positive anemia, likely secondary to NSAID use.  She also has  hemorrhoids.   PLAN:  1. Upper endoscopy in  the a.m. to search for source of anemia.  Doubt      colonoscopy will be necessary.  2. PPI therapy.  3. Ongoing MiraLax stool softener.  4. Hemorrhoid suppositories and creams.      Stephani Police, PA    ______________________________  Graylin Shiver, M.D.    MLY/MEDQ  D:  07/15/2007  T:  07/15/2007  Job:  [id number]   cc:   Fayrene Fearing L. Malon Kindle., M.D.  Fax: [phone]   Graylin Shiver, M.D.  Fax: [phone]   Chales Salmon. Abigail Miyamoto, M.D.  Fax: [phone]

## 2010-07-31 NOTE — Op Note (Signed)
[name], [name] NO.:  [id number]   MEDICAL RECORD NO.:  [id number]          PATIENT TYPE:  INP   LOCATION:  1436                         FACILITY:  [facility]   PHYSICIAN:  Petra Kuba, M.D.    DATE OF BIRTH:  [birth year]   DATE OF PROCEDURE:  07/16/2007  DATE OF DISCHARGE:                               OPERATIVE REPORT   PROCEDURE:  EGD.   INDICATIONS:  Guaiac-positive iron deficiency, negative colonoscopy last  year by Dr. Randa Evens.   Consent was signed after the risks, benefits, methods and options were  thoroughly discussed by myself prior to the procedure, and yesterday by  Dr. Evette Cristal and Algis Downs, PA.   MEDICATIONS USED:  1. Fentanyl 50 mcg.  2. Versed 4 mg.   PROCEDURE:  The video endoscope was inserted by direct vision.  The  proximal and mid esophagus was normal. She had a large hiatal hernia,  just distal to that probably some linear erosions -- compatible with  some Sheria Lang illusions.  The scope passed through a normal antrum,  normal pylorus and into a normal duodenal bulb; and then around the  celiac to a normal second portion of the duodenum.  No blood was seen  distally.  The scope was withdrawn back to the bulb, and a good look  down ruled out abnormalities or malformations.  The scope was withdrawn  back into the stomach and retroflexed.  High in the cardia the large  hiatal hernia was confirmed, with probable Sheria Lang illusions were  confirmed.  The fundus, angularis, lesser and greater curve on  retroflexion was normal.  Otherwise, straight visualization of the  stomach did not reveal any additional findings.  There were no signs of  active bleeding or other procedure.  Air was suctioned and the scope was  slowly withdrawn.  Again, a good look at the hiatal hernia pouch and the  proximal and mid esophagus were normal.  The scope was removed.  The  patient tolerated the procedure well.  There was no obvious immediate   complication.   ENDOSCOPIC DIAGNOSES:  1. 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  2. No blood seen.  3. Otherwise normal EGD.   PLAN:  Add iron.  Minimize aspirin and nonsteroidals.  Probable b.i.d.  pump inhibitors.   FOLLOWUP:  James L. Edwards, M.D., in one month or p.r.n. to recheck  guaiac, CBC and decide any other workup and plans.   I think it is okay to hold off for now, since the patient is stable and  no worrisome symptoms, and a colonoscopy last year that was normal.           ______________________________  Petra Kuba, M.D.     MEM/MEDQ  D:  07/16/2007  T:  07/16/2007  Job:  [id number]   cc:   Ramiro Harvest, MD   Illa Level, MD   Llana Aliment. Malon Kindle., M.D.  Fax: [phone]

## 2010-07-31 NOTE — H&P (Signed)
[name], [name] NO.:  [id number]   MEDICAL RECORD NO.:  [id number]          PATIENT TYPE:  INP   LOCATION:  1436                         FACILITY:  [facility]   PHYSICIAN:  Hollice Espy, M.D.DATE OF BIRTH:  Jan 05, 1925   DATE OF ADMISSION:  07/14/2007  DATE OF DISCHARGE:                              HISTORY & PHYSICAL   PRIMARY CARE PHYSICIAN:  Chales Salmon. Abigail Miyamoto, M.D.   CHIEF COMPLAINT:  Anemia.   HISTORY OF PRESENT ILLNESS:  The patient is a very pleasant 77 year old  white female with past medical history of mild hypertension, arthritis  and depression who last had blood work done about a year ago.  She has  no previous history of anemia and blood work done a year ago was fine.  When Dr. Abigail Miyamoto evaluated her for her annual exam, he had blood work  done and he noted a right carotid bruit, but more concerning her blood  work came back for a severely low hemoglobin at 6.8.  The patient had no  previous history of bleeding or anemia.  He sent her back to reconfirm  labs today, and when this was confirmed he called myself on behalf of  the hospitalists to arrange for direct admission and further evaluation  as well as treatment with blood transfusion.  When the patient arrived  to the floor, she was doing well.  She denied any headaches, vision  changes.  No dysphagia, no chest pain, palpitations.  She did complain  of some increased dyspnea on exertion as of late but no wheezing or  coughing.  No abdominal pain.  No hematuria or dysuria.  She says  occasionally she has problems with constipation and some loose stool but  no severe diarrhea.  She denies any focal extremity numbness, weakness  or pain although she has noticed some lower extremity swelling in the  last couple of months.  In addition she has noted occasionally some  bright red blood per stool but not severe in the last couple of weeks  but noted no dark black tarry stools.  She has had no  hemoptysis or  hematemesis either.   REVIEW OF SYSTEMS:  Otherwise negative.   PAST MEDICAL HISTORY:  Includes hypertension, hyperlipidemia, history of  hiatal hernia, DJD, osteoporosis, varicose veins, hyperlipidemia,  depression.   MEDICATIONS:  The patient is on HCTZ 12.5, Zoloft 25, Vivelle patch  changed on Wednesdays and Saturdays, Vicodin 5/500 p.o. q.6 h p.r.n.,  Robaxin twice a day, Advil p.r.n., she takes rarely, Caltrate b.i.d.,  glucosamine b.i.d., multivitamin daily and Prilosec 20 mg daily.   ALLERGIES:  NOVOCAIN.   SOCIAL HISTORY:  No tobacco, alcohol or drug use.   FAMILY HISTORY:  Noncontributory.   PHYSICAL EXAMINATION:  VITAL SIGNS ON ADMISSION:  Temperature 98.4,  heart rate 100, blood pressure 167/79, respirations 22, O2 sat 97% on  room air.  GENERAL:  The patient is alert and oriented x 3, in no apparent  distress.  HEENT:  Normocephalic, atraumatic.  Her mucous membranes are moist.  She  has a right carotid bruit.  HEART:  Regular rate and rhythm.  S1, S2.  LUNGS:  Clear to auscultation bilaterally.  ABDOMEN:  Soft, nontender, nondistended.  Positive bowel sounds.  EXTREMITIES:  Show no clubbing, cyanosis.  Legs have about a 1 to 2+  pitting edema from the knees down.   LABORATORY WORK:  I have ordered a repeat CBC and CMET and coags, which  are pending.  In the meantime, the lab work from the office notes a  white count of 5.5 and H&H of 6.8 and 23.4 with an MCV of 65, platelet  count of 417.   ASSESSMENT/PLAN:  1. Microcytic anemia.  We will check iron studies and transfuse the      patient 2 units packed red blood cells.  It is of concern although      she has had a colonoscopy in the last year.  We will try to obtain      that report to confirm if indeed it was a clean colonoscopy as the      only way we can truly rule out malignancy.  If she has indeed had a      colonoscopy, we will check iron stores and replace, also look for      possible  other GI bleeding cause although again with her signs,      especially with her lower extremity swelling, progressive shortness      of breath, it appears to be more chronic in nature.  2. Right carotid bruit.  We will check carotid Dopplers.  3. Hypertension, currently stable.  4. Depression, holding her p.o. meds until we have ruled out a GI      bleed.      Hollice Espy, M.D.  Electronically Signed     SKK/MEDQ  D:  07/14/2007  T:  07/14/2007  Job:  [id number]   cc:   Chales Salmon. Abigail Miyamoto, M.D.  Fax: [phone]

## 2010-08-02 ENCOUNTER — Encounter (HOSPITAL_COMMUNITY): Payer: Medicare Other

## 2010-08-03 NOTE — Op Note (Signed)
NAME:  [name], [name]                          ACCOUNT NO.:  [id number]   MEDICAL RECORD NO.:  [id number]                   PATIENT TYPE:  INP   LOCATION:  0468                                 FACILITY:  [facility]   PHYSICIAN:  Timothy E. Earlene Plater, M.D.              DATE OF BIRTH:  [birth year]   DATE OF PROCEDURE:  10/29/2002  DATE OF DISCHARGE:                                 OPERATIVE REPORT   PREOPERATIVE DIAGNOSIS:  Primary ventral hernia.   POSTOPERATIVE DIAGNOSIS:  Primary ventral hernia.   PROCEDURE:  Laparoscopic repair of primary ventral hernia right lower  quadrant abdominal wall.   SURGEON:  Timothy E. Earlene Plater, M.D.   ANESTHESIA:  General.   INDICATIONS FOR PROCEDURE:  [name] has had a ventral hernia enlarging  over the past several years and because of the increasing size and symptoms,  she wishes to have it repaired. This has been carefully discussed and  explained and she is in agreement. She was evaluated by anesthesia,  laboratory data noted, identified and the permit signed.   DESCRIPTION OF PROCEDURE:  The patient was taken to the operating room,  placed supine, general endotracheal anesthesia administered. The left arm  was tucked, the right arm was outstretched, a Foley catheter inserted, PAS  hose in place. The abdomen was prepped and draped in the usual fashion. I  made an incision in the left lower quadrant, identified the fascia, grasped  the fascia, made a nick in the fascia and then introduced the Optiview  trocar and scope under direct vision into the abdominal cavity without  complications. The abdomen was insufflated, the Optiview removed and the  scope introduced. The abdomen insufflated nicely. There were some adhesions  to the left lower quadrant, simple omental adhesions. There was a distinct  approximately 4 x 6 cm hernia defect in the right lower quadrant. I placed  two 5 mm working trocars in the left upper quadrant. I took down the  adhesions  with the cautery and there were no complications. Then from the  right side of the table, I was able to palpate and draw the hernia defect on  the skin and then allowed for a 2 cm extension on all sides of the hernia  which was roughly a horizontal ovoid. A piece of Parietex composite mesh was  opened and then cut to fit the exact size of the patch for repair, this was  laid against the skin, carefully marked. Then eight sutures of #1 Novofil  were placed in the mesh with the appropriate side oriented and the  appropriate shape and lay of the mesh oriented. The mesh was then wet,  rolled and placed into the abdominal cavity through the 10 mm port. The mesh  was unrolled, properly oriented and then each of the two tails of the eight  sutures were pulled through the abdominal wall at the appropriately marked  places on the abdominal skin. When these were all accomplished and  everything was ascertained to be in correct orientation, the mesh was pulled  to the abdominal wall and the eight sutures were tied, cut and tucked under  the skin. The mesh lay nicely with a few wrinkles. Using the tacker then the  mesh was straightened out somewhat and the tacker was used to finish  approximating the mesh to the anterior abdominal wall. When this was  accomplished, the mesh looked fine and it was smooth against the anterior  abdominal wall. The area of adhesions taken down were noted and it was  without complication. The omentum was scooted over to the right side of the  abdomen. Then I removed the scope and with the abdomen still insufflated, I  repaired the fascial defect in the 10 mm site with a #0 Vicryl. Then we  allowed the air to escape from the abdomen slowly and the abdominal  cavity lay flat. The procedure was complete, the counts were correct, the  incisions were closed with 3-0 Monocryl and Steri-Strips applied to all  puncture marks and incision. Final counts correct, she tolerated it well  and  was awakened and taken to the recovery room in good condition.                                               Timothy E. Earlene Plater, M.D.    TED/MEDQ  D:  10/29/2002  T:  10/29/2002  Job:  [id number]   cc:   Chales Salmon. Abigail Miyamoto, M.D.  [address]  [place]  [place] [address]  Fax: [phone]

## 2010-08-03 NOTE — Op Note (Signed)
[place]  Patient:    [name], [name]                       MRN: [id number] Proc. Date: 08/07/99 Adm. Date:  [id number] Disc. Date: [id number] Attending:  Orland Mustard CC:         Chales Salmon. Abigail Miyamoto, M.D.                           Operative Report  PROCEDURE:  Colonoscopy.  MEDICATIONS: 1. Fentanyl 75 mcg. 2. Versed 7 mg IV.  INDICATION:  Persistent right-sided abdominal pain with diverticula on barium enema and an abnormal cecum was not well seen with a question of something possibly in the cecum.  DESCRIPTION OF PROCEDURE:  Procedure had been explained to the patient and consent obtained.  With the patient in the left lateral decubitus position, the Olympus pediatric video colonoscope was inserted ______ under direct visualization.  We were able to advance to the cecum using abdominal pressure. The terminal ileum was entered for just a short distance.  I could not advance far but it appeared normal.  The cecum was also endoscopically normal.  The patient was very well prepped.  We withdrew the scope in the cecum, and ascending colon, hepatic flexure, transverse colon, splenic flexure, descending, and sigmoid colon seen well.  Mild to moderate diverticulosis in the sigmoid colon.  No stricture and no difficulty passing.  No polyps seen throughout.  Scope was withdrawn in the rectum.  The rectum essentially was normal and no polyps or other lesions.  The patient tolerated the procedure well and maintained on low flow oxygen and pulse oximeter throughout the procedure with no obvious problem.  ASSESSMENT: 1. Abnormal barium enema but no abnormality clearly seen in the cecum or    terminal ileum on colonoscopy. 2. Moderate diverticulosis.  PLAN:  Will start her on a high fiber diet, diverticulosis information.  See back in the office in four to six weeks. DD:  08/07/99 TD:  08/11/99 Job: [id number]

## 2010-08-03 NOTE — Discharge Summary (Signed)
NAMECHERLYNN, [name] NO.:  [id number]   MEDICAL RECORD NO.:  [id number]          PATIENT TYPE:  INP   LOCATION:  1436                         FACILITY:  [facility]   PHYSICIAN:  Brooke Harvest, MD    DATE OF BIRTH:  12-05-1924   DATE OF ADMISSION:  07/14/2007  DATE OF DISCHARGE:  07/17/2007                               DISCHARGE SUMMARY   PRIMARY CARE PHYSICIAN:  [name]. [name], M.D.   DISCHARGE DIAGNOSIS:  1. Severe iron-deficiency anemia likely secondary to large hiatal      hernia and Cameron erosions.  2. Right carotid bruit.  3. Hypertension.  4. Diverticulosis.  5. Depression.  6. Osteoporosis.  7. Hyperlipidemia.  8. Degenerative joint disease.  9. Ventral hernia status post repair August 2004.  10.Hemangioma.  11.Chronic back pain.  12.External hemorrhoids.   DISCHARGE MEDICATIONS:  1. Iron sulfate 325 mg p.o. t.i.d.  2. Anusol HC suppositories q.a.m. and nightly.  3. Prilosec 40 mg p.o. b.i.d.  4. HCTZ 12.5 mg p.o. daily.  5. Zoloft 25 mg p.o. daily.  6. Vivelle  patch change or Wednesday and Saturday, as previously      taken.  7. Vicodin 5/500 p.o. q.4 h. p.r.n.  8. Robaxin b.i.d..  9. Caltrate b.i.d., as previously taken.  10.Glucosamine b.i.d., as previously taken.  11.Multivitamin daily.   DISPOSITION:  On followup the patient will be discharged home to follow  up with Dr. Randa Campbell in 2-4 weeks.  The patient is to minimize NSAID use  and will be started on iron sulfate and Prilosec twice daily.   CONSULTATIONS DONE:  1. A gastroenterology consult was done.  The patient was seen in      consultation by Dr. Evette Campbell on July 15, 2007.   PROCEDURES PERFORMED:  1. An upper endoscopy was performed on July 16, 2007 that showed a      large hiatal hernia with probable Sheria Lang erosions in the stomach      just underneath the hiatal hernia.  No blood seen, otherwise normal      EGD.  2. Bilateral carotid Dopplers were obtained which  showed vertebral      artery flow antegrade, bilaterally.  No significant right ICA      stenosis.  No significant left ICA stenosis.  3. Also the patient was status post 2 units packed red blood cell      transfusion on July 14, 2007.   BRIEF ADMISSION HISTORY AND PHYSICAL:  [name] is a pleasant  77 year old white female with past medical history of mild hypertension,  arthritis, and depression who last had blood work done about a year ago.  The patient has had no previous history of anemia, and blood work done a  year ago was okay.  When Dr. Abigail Campbell evaluated the patient for annual  exam, he had blood work blood work done and noted a right carotid bruit,  but more concerning, her blood work came back persistently low  hemoglobin at 6.8.  The patient had no previous history of bleeding or  anemia.  He sent her  back to reconfirm labs on the day of admission, and  was confirmed and called the admitting physician, on behalf of the  hospitalist, to arrange for direct admission; and further evaluation as  well as treatment with a blood transfusion.  When the patient arrived to  the floor she was doing okay.  She denied any headaches, no visual  changes, no dysplasia, no chest pain, nor palpitations.  She did  complain of some increased shortness of breath on exertion as of late;  but no wheezing or coughing, no abdominal pain, no hematuria, no  dysuria.  States occasionally she had problems with constipation with  some loose stools, but no severe diarrhea.  Denied any focal extremity  numbness, weakness, or pain; although she had noted some lower extremity  swelling the last couple months.  In addition she had noted,  occasionally, some bright red blood per stool but not severe in the last  couple weeks; and noted no dark black tarry stools.  The patient has had  no hemoptysis or hematemesis either.   PHYSICAL EXAM:  VITAL SIGNS:  On admission temperature 98.4, pulse of  100,  blood pressure 167/79, respirations 20, saturating 97% on room air.  GENERAL:  The patient was alert and oriented x3.  No apparent distress.  HEENT:  Normocephalic, atraumatic.  Moist mucous membranes.  NECK:  Right carotid bruit.  CARDIOVASCULAR:  Regular rate and rhythm, S1-S2.  RESPIRATORY:  Lungs were clear to auscultation bilaterally.  ABDOMEN:  Soft, nontender, nondistended, positive bowel sounds.  EXTREMITIES:  No clubbing or cyanosis, 1+ to 2+ pitting edema of the  lower extremities bilaterally from the knees down.   HOSPITAL COURSE:  Problem #1:  Severe iron-deficiency anemia.  Repeat  hemoglobin was done and showed a CBC with hemoglobin of 6.5.  The  patient was typed-and-crossed and transfused 2 units of packed red blood  cells.  Post transfusion the CBC obtained showed a hemoglobin of up to  9.3, an anemia panel was also obtained which showed that the patient had  an iron level of less than 10. and a TIBC was not calculated secondary  to that, and a ferritin of 2.  The patient's symptoms improved during  the hospitalization.  A GI consult was obtained.  The patient was seen  in consultation per Dr. Evette Campbell.  Upper endoscopy was done with results as  stated above, and it was felt the patient's severe iron-deficiency  anemia was likely secondary to a large hiatal hernia and Cameron  erosions.  The patient was placed on PPI twice daily, and iron  replacement was started.  It was felt that the patient had had a recent  colonoscopy which was negative, and as such, did not need a colonoscopy  during the hospitalization; and will be followed as an outpatient per  Dr. Randa Campbell in approximately 2-4 weeks post discharge to be reassessed,  and if a colonoscopy is needed, it will be done at that point in time.  The patient was told to minimize NSAID use.  The patient's diet was  advanced from clear liquids to a full diet.  The patient tolerated her  diet well, and by day of discharge the  patient was in stable and  improved condition.   Problem #2:  Right carotid bruit.  Carotid Dopplers were obtained to  assess the patient's right carotid bruit which showed no significant ICA  stenosis.   Problem #3:  Hypertension.  On initial presentation due to the  patient's  low hemoglobin, the patient's blood pressure medications were held.  After the patient had an endoscopy done.  The patient's blood pressure  medications were restarted,. and the patient will be discharged home on  her blood pressure medications.   Problem #4:  Depression.  The patient's Zoloft was held on initial  admission, but was restarted during the hospitalization.  The patient  showed no signs or symptoms of suicidal ideation or homicidal ideation.  The patient will be discharged home in stable and improved condition.   The rest of the patient's chronic medical issues were stable throughout  the hospitalization.  The patient was maintained on her regular home  medications, and the patient was discharged in stable and improved  condition.   On day of discharge vital signs temperature 98.6, pulse of 89,  respirations 20, blood pressure 141/73, saturating 96% on room air.   DISCHARGE LABS:  CBC:  White count 5.9, hemoglobin 10.0, hematocrit  32.1, platelet count 324.  A focal occult blood test was negative.  A  BMET on day of discharge:  Sodium 141, potassium 4.2, chloride 108,  bicarb 30, BUN 11, creatinine 0.80, glucose of 109, and a calcium of  8.9.  It was a pleasure taking care of [name].      Brooke Harvest, MD  Electronically Signed     DT/MEDQ  D:  08/13/2007  T:  08/13/2007  Job:  [id number]   cc:   [name]. [name], M.D.  Fax: [phone]   Llana Aliment. Malon Kindle., M.D.  Fax: [phone]

## 2010-08-07 ENCOUNTER — Encounter (HOSPITAL_COMMUNITY): Payer: Medicare Other

## 2010-08-09 ENCOUNTER — Encounter (HOSPITAL_COMMUNITY): Payer: Medicare Other

## 2010-08-14 ENCOUNTER — Encounter (HOSPITAL_COMMUNITY): Payer: Medicare Other

## 2010-08-16 ENCOUNTER — Encounter (HOSPITAL_COMMUNITY): Payer: Medicare Other

## 2010-08-21 ENCOUNTER — Encounter (HOSPITAL_COMMUNITY): Payer: Medicare Other

## 2010-08-23 ENCOUNTER — Encounter (HOSPITAL_COMMUNITY): Payer: Medicare Other

## 2010-08-28 ENCOUNTER — Encounter (HOSPITAL_COMMUNITY): Payer: Medicare Other

## 2010-08-30 ENCOUNTER — Encounter (HOSPITAL_COMMUNITY): Payer: Medicare Other

## 2010-09-04 ENCOUNTER — Encounter (HOSPITAL_COMMUNITY): Payer: Medicare Other

## 2010-09-06 ENCOUNTER — Encounter (HOSPITAL_COMMUNITY): Payer: Medicare Other

## 2010-09-11 ENCOUNTER — Encounter (HOSPITAL_COMMUNITY): Payer: Medicare Other

## 2010-09-13 ENCOUNTER — Encounter (HOSPITAL_COMMUNITY): Payer: Medicare Other

## 2010-12-11 LAB — CBC
HCT: 21.9 — ABNORMAL LOW
HCT: 30.6 — ABNORMAL LOW
Hemoglobin: 6.5 — CL
Hemoglobin: 9.6 — ABNORMAL LOW
Hemoglobin: 9.7 — ABNORMAL LOW
MCHC: 29.7 — ABNORMAL LOW
MCHC: 31.4
MCV: 64.4 — ABNORMAL LOW
Platelets: 389
RBC: 3.4 — ABNORMAL LOW
RBC: 4.33
RDW: 24.4 — ABNORMAL HIGH
RDW: 24.7 — ABNORMAL HIGH
WBC: 6.1

## 2010-12-11 LAB — IRON AND TIBC: UIBC: 385

## 2010-12-11 LAB — COMPREHENSIVE METABOLIC PANEL
ALT: 11
CO2: 29
Calcium: 8.9
GFR calc non Af Amer: >60
Glucose, Bld: 127 — ABNORMAL HIGH
Sodium: 141

## 2010-12-11 LAB — CROSSMATCH: ABO/RH(D): A POS

## 2010-12-11 LAB — BASIC METABOLIC PANEL
CO2: 30
GFR calc non Af Amer: >60
Glucose, Bld: 109 — ABNORMAL HIGH
Potassium: 4.2
Sodium: 141

## 2010-12-11 LAB — HEMOGLOBIN AND HEMATOCRIT, BLOOD
HCT: 29.4 — ABNORMAL LOW
Hemoglobin: 9.3 — ABNORMAL LOW

## 2010-12-11 LAB — PROTIME-INR: Prothrombin Time: 14.6

## 2010-12-12 LAB — CBC
HCT: 32.1 — ABNORMAL LOW
MCHC: 31
MCV: 72.1 — ABNORMAL LOW
RBC: 4.45

## 2010-12-25 ENCOUNTER — Ambulatory Visit: Payer: BC Managed Care – PPO | Admitting: Physical Therapy

## 2011-01-07 ENCOUNTER — Other Ambulatory Visit: Payer: Self-pay | Admitting: Neurology

## 2011-01-07 DIAGNOSIS — M47812 Spondylosis without myelopathy or radiculopathy, cervical region: Secondary | ICD-10-CM

## 2011-01-07 DIAGNOSIS — R269 Unspecified abnormalities of gait and mobility: Secondary | ICD-10-CM

## 2011-01-11 ENCOUNTER — Ambulatory Visit
Admission: RE | Admit: 2011-01-11 | Discharge: 2011-01-11 | Disposition: A | Discharge status: Home / Self Care | Payer: Medicare Other | Source: Ambulatory Visit | Attending: Neurology | Admitting: Neurology

## 2011-01-11 DIAGNOSIS — M47812 Spondylosis without myelopathy or radiculopathy, cervical region: Secondary | ICD-10-CM

## 2011-01-11 DIAGNOSIS — R269 Unspecified abnormalities of gait and mobility: Secondary | ICD-10-CM

## 2011-01-18 ENCOUNTER — Other Ambulatory Visit: Payer: Self-pay | Admitting: Neurology

## 2011-01-18 DIAGNOSIS — R269 Unspecified abnormalities of gait and mobility: Secondary | ICD-10-CM

## 2011-01-21 ENCOUNTER — Ambulatory Visit
Admission: RE | Admit: 2011-01-21 | Discharge: 2011-01-21 | Disposition: A | Discharge status: Home / Self Care | Payer: Medicare Other | Source: Ambulatory Visit | Attending: Neurology | Admitting: Neurology

## 2011-01-21 DIAGNOSIS — R269 Unspecified abnormalities of gait and mobility: Secondary | ICD-10-CM

## 2011-03-25 ENCOUNTER — Ambulatory Visit: Payer: Medicare Other | Admitting: Physical Therapy

## 2011-03-26 ENCOUNTER — Ambulatory Visit: Payer: Medicare Other | Attending: Family Medicine | Admitting: Physical Therapy

## 2011-03-26 DIAGNOSIS — R269 Unspecified abnormalities of gait and mobility: Secondary | ICD-10-CM | POA: Insufficient documentation

## 2011-03-26 DIAGNOSIS — IMO0001 Reserved for inherently not codable concepts without codable children: Secondary | ICD-10-CM | POA: Diagnosis not present

## 2011-04-05 ENCOUNTER — Ambulatory Visit: Payer: Medicare Other | Admitting: Physical Therapy

## 2011-04-09 ENCOUNTER — Other Ambulatory Visit: Payer: Self-pay | Admitting: Neurology

## 2011-04-09 ENCOUNTER — Ambulatory Visit: Payer: Medicare Other | Admitting: Physical Therapy

## 2011-04-09 ENCOUNTER — Ambulatory Visit
Admission: RE | Admit: 2011-04-09 | Discharge: 2011-04-09 | Disposition: A | Discharge status: Home / Self Care | Payer: Medicare Other | Source: Ambulatory Visit | Attending: Neurology | Admitting: Neurology

## 2011-04-09 DIAGNOSIS — M47817 Spondylosis without myelopathy or radiculopathy, lumbosacral region: Secondary | ICD-10-CM | POA: Diagnosis not present

## 2011-04-09 DIAGNOSIS — M8448XA Pathological fracture, other site, initial encounter for fracture: Secondary | ICD-10-CM | POA: Diagnosis not present

## 2011-04-09 DIAGNOSIS — M545 Low back pain: Secondary | ICD-10-CM

## 2011-04-09 DIAGNOSIS — R269 Unspecified abnormalities of gait and mobility: Secondary | ICD-10-CM | POA: Diagnosis not present

## 2011-04-09 DIAGNOSIS — IMO0001 Reserved for inherently not codable concepts without codable children: Secondary | ICD-10-CM | POA: Diagnosis not present

## 2011-04-10 DIAGNOSIS — M069 Rheumatoid arthritis, unspecified: Secondary | ICD-10-CM | POA: Diagnosis not present

## 2011-04-10 DIAGNOSIS — I10 Essential (primary) hypertension: Secondary | ICD-10-CM | POA: Diagnosis not present

## 2011-04-10 DIAGNOSIS — F411 Generalized anxiety disorder: Secondary | ICD-10-CM | POA: Diagnosis not present

## 2011-04-11 ENCOUNTER — Ambulatory Visit: Payer: Medicare Other | Admitting: Physical Therapy

## 2011-04-11 DIAGNOSIS — R269 Unspecified abnormalities of gait and mobility: Secondary | ICD-10-CM | POA: Diagnosis not present

## 2011-04-11 DIAGNOSIS — IMO0001 Reserved for inherently not codable concepts without codable children: Secondary | ICD-10-CM | POA: Diagnosis not present

## 2011-04-15 DIAGNOSIS — Z79899 Other long term (current) drug therapy: Secondary | ICD-10-CM | POA: Diagnosis not present

## 2011-04-15 DIAGNOSIS — M1990 Unspecified osteoarthritis, unspecified site: Secondary | ICD-10-CM | POA: Diagnosis not present

## 2011-04-15 DIAGNOSIS — M2550 Pain in unspecified joint: Secondary | ICD-10-CM | POA: Diagnosis not present

## 2011-04-17 ENCOUNTER — Ambulatory Visit: Payer: Medicare Other | Admitting: Physical Therapy

## 2011-04-18 ENCOUNTER — Ambulatory Visit: Payer: Medicare Other | Admitting: Physical Therapy

## 2011-04-18 DIAGNOSIS — R269 Unspecified abnormalities of gait and mobility: Secondary | ICD-10-CM | POA: Diagnosis not present

## 2011-04-18 DIAGNOSIS — IMO0001 Reserved for inherently not codable concepts without codable children: Secondary | ICD-10-CM | POA: Diagnosis not present

## 2011-04-19 ENCOUNTER — Ambulatory Visit: Payer: Medicare Other | Admitting: Physical Therapy

## 2011-04-23 ENCOUNTER — Ambulatory Visit: Payer: Medicare Other | Admitting: Physical Therapy

## 2011-04-23 ENCOUNTER — Ambulatory Visit: Payer: Medicare Other | Attending: Family Medicine | Admitting: Physical Therapy

## 2011-04-23 DIAGNOSIS — IMO0001 Reserved for inherently not codable concepts without codable children: Secondary | ICD-10-CM | POA: Diagnosis not present

## 2011-04-23 DIAGNOSIS — R269 Unspecified abnormalities of gait and mobility: Secondary | ICD-10-CM | POA: Insufficient documentation

## 2011-04-25 ENCOUNTER — Ambulatory Visit: Payer: Medicare Other | Admitting: Physical Therapy

## 2011-04-30 ENCOUNTER — Ambulatory Visit: Payer: Medicare Other | Admitting: Physical Therapy

## 2011-05-02 ENCOUNTER — Encounter: Payer: Medicare Other | Admitting: Physical Therapy

## 2011-05-02 ENCOUNTER — Ambulatory Visit: Payer: Medicare Other | Admitting: Physical Therapy

## 2011-05-03 ENCOUNTER — Other Ambulatory Visit (HOSPITAL_COMMUNITY)
Admission: RE | Admit: 2011-05-03 | Discharge: 2011-05-03 | Disposition: A | Discharge status: Home / Self Care | Payer: Medicare Other | Source: Ambulatory Visit | Attending: Obstetrics and Gynecology | Admitting: Obstetrics and Gynecology

## 2011-05-03 ENCOUNTER — Other Ambulatory Visit: Payer: Self-pay | Admitting: Obstetrics and Gynecology

## 2011-05-03 DIAGNOSIS — N393 Stress incontinence (female) (male): Secondary | ICD-10-CM | POA: Diagnosis not present

## 2011-05-03 DIAGNOSIS — N8111 Cystocele, midline: Secondary | ICD-10-CM | POA: Diagnosis not present

## 2011-05-03 DIAGNOSIS — Z01419 Encounter for gynecological examination (general) (routine) without abnormal findings: Secondary | ICD-10-CM | POA: Diagnosis not present

## 2011-05-03 DIAGNOSIS — R350 Frequency of micturition: Secondary | ICD-10-CM | POA: Diagnosis not present

## 2011-05-03 DIAGNOSIS — Z124 Encounter for screening for malignant neoplasm of cervix: Secondary | ICD-10-CM | POA: Insufficient documentation

## 2011-05-03 DIAGNOSIS — N952 Postmenopausal atrophic vaginitis: Secondary | ICD-10-CM | POA: Diagnosis not present

## 2011-05-07 ENCOUNTER — Ambulatory Visit: Payer: Medicare Other | Admitting: Physical Therapy

## 2011-05-09 ENCOUNTER — Encounter: Payer: Medicare Other | Admitting: Physical Therapy

## 2011-05-14 ENCOUNTER — Ambulatory Visit: Payer: Medicare Other | Admitting: Physical Therapy

## 2011-05-16 ENCOUNTER — Encounter: Payer: Medicare Other | Admitting: Physical Therapy

## 2011-05-17 DIAGNOSIS — R300 Dysuria: Secondary | ICD-10-CM | POA: Diagnosis not present

## 2011-05-21 ENCOUNTER — Encounter: Payer: Medicare Other | Admitting: Physical Therapy

## 2011-05-22 DIAGNOSIS — F329 Major depressive disorder, single episode, unspecified: Secondary | ICD-10-CM | POA: Diagnosis not present

## 2011-05-22 DIAGNOSIS — N390 Urinary tract infection, site not specified: Secondary | ICD-10-CM | POA: Diagnosis not present

## 2011-05-23 ENCOUNTER — Encounter: Payer: Medicare Other | Admitting: Physical Therapy

## 2011-05-28 DIAGNOSIS — N390 Urinary tract infection, site not specified: Secondary | ICD-10-CM | POA: Diagnosis not present

## 2011-07-17 DIAGNOSIS — H52209 Unspecified astigmatism, unspecified eye: Secondary | ICD-10-CM | POA: Diagnosis not present

## 2011-07-17 DIAGNOSIS — Z961 Presence of intraocular lens: Secondary | ICD-10-CM | POA: Diagnosis not present

## 2011-07-18 DIAGNOSIS — M2550 Pain in unspecified joint: Secondary | ICD-10-CM | POA: Diagnosis not present

## 2011-07-18 DIAGNOSIS — Z79899 Other long term (current) drug therapy: Secondary | ICD-10-CM | POA: Diagnosis not present

## 2011-07-18 DIAGNOSIS — M1990 Unspecified osteoarthritis, unspecified site: Secondary | ICD-10-CM | POA: Diagnosis not present

## 2011-07-24 DIAGNOSIS — D649 Anemia, unspecified: Secondary | ICD-10-CM | POA: Diagnosis not present

## 2011-08-06 DIAGNOSIS — D509 Iron deficiency anemia, unspecified: Secondary | ICD-10-CM | POA: Diagnosis not present

## 2011-08-06 DIAGNOSIS — Z1211 Encounter for screening for malignant neoplasm of colon: Secondary | ICD-10-CM | POA: Diagnosis not present

## 2011-08-13 DIAGNOSIS — D509 Iron deficiency anemia, unspecified: Secondary | ICD-10-CM | POA: Diagnosis not present

## 2011-08-21 DIAGNOSIS — L280 Lichen simplex chronicus: Secondary | ICD-10-CM | POA: Diagnosis not present

## 2011-08-21 DIAGNOSIS — L570 Actinic keratosis: Secondary | ICD-10-CM | POA: Diagnosis not present

## 2011-08-21 DIAGNOSIS — D235 Other benign neoplasm of skin of trunk: Secondary | ICD-10-CM | POA: Diagnosis not present

## 2011-09-18 DIAGNOSIS — N3020 Other chronic cystitis without hematuria: Secondary | ICD-10-CM | POA: Diagnosis not present

## 2011-10-08 ENCOUNTER — Other Ambulatory Visit: Payer: Self-pay | Admitting: Gastroenterology

## 2011-10-08 DIAGNOSIS — R1310 Dysphagia, unspecified: Secondary | ICD-10-CM

## 2011-10-08 DIAGNOSIS — D509 Iron deficiency anemia, unspecified: Secondary | ICD-10-CM | POA: Diagnosis not present

## 2011-10-17 ENCOUNTER — Ambulatory Visit
Admission: RE | Admit: 2011-10-17 | Discharge: 2011-10-17 | Disposition: A | Discharge status: Home / Self Care | Payer: Medicare Other | Source: Ambulatory Visit | Attending: Gastroenterology | Admitting: Gastroenterology

## 2011-10-17 DIAGNOSIS — K449 Diaphragmatic hernia without obstruction or gangrene: Secondary | ICD-10-CM | POA: Diagnosis not present

## 2011-10-17 DIAGNOSIS — R1310 Dysphagia, unspecified: Secondary | ICD-10-CM | POA: Diagnosis not present

## 2011-10-28 DIAGNOSIS — K219 Gastro-esophageal reflux disease without esophagitis: Secondary | ICD-10-CM | POA: Diagnosis not present

## 2011-10-28 DIAGNOSIS — K224 Dyskinesia of esophagus: Secondary | ICD-10-CM | POA: Diagnosis not present

## 2011-11-27 DIAGNOSIS — D699 Hemorrhagic condition, unspecified: Secondary | ICD-10-CM | POA: Diagnosis not present

## 2011-12-03 DIAGNOSIS — K224 Dyskinesia of esophagus: Secondary | ICD-10-CM | POA: Diagnosis not present

## 2011-12-03 DIAGNOSIS — K449 Diaphragmatic hernia without obstruction or gangrene: Secondary | ICD-10-CM | POA: Diagnosis not present

## 2011-12-11 DIAGNOSIS — N814 Uterovaginal prolapse, unspecified: Secondary | ICD-10-CM | POA: Diagnosis not present

## 2011-12-11 DIAGNOSIS — R0989 Other specified symptoms and signs involving the circulatory and respiratory systems: Secondary | ICD-10-CM | POA: Diagnosis not present

## 2011-12-11 DIAGNOSIS — K219 Gastro-esophageal reflux disease without esophagitis: Secondary | ICD-10-CM | POA: Diagnosis not present

## 2011-12-11 DIAGNOSIS — M4800 Spinal stenosis, site unspecified: Secondary | ICD-10-CM | POA: Diagnosis not present

## 2011-12-11 DIAGNOSIS — I10 Essential (primary) hypertension: Secondary | ICD-10-CM | POA: Diagnosis not present

## 2011-12-11 DIAGNOSIS — J984 Other disorders of lung: Secondary | ICD-10-CM | POA: Diagnosis not present

## 2011-12-11 DIAGNOSIS — IMO0002 Reserved for concepts with insufficient information to code with codable children: Secondary | ICD-10-CM | POA: Diagnosis not present

## 2011-12-11 DIAGNOSIS — R262 Difficulty in walking, not elsewhere classified: Secondary | ICD-10-CM | POA: Diagnosis not present

## 2011-12-13 DIAGNOSIS — K219 Gastro-esophageal reflux disease without esophagitis: Secondary | ICD-10-CM | POA: Diagnosis not present

## 2011-12-13 DIAGNOSIS — R0989 Other specified symptoms and signs involving the circulatory and respiratory systems: Secondary | ICD-10-CM | POA: Diagnosis not present

## 2011-12-13 DIAGNOSIS — J984 Other disorders of lung: Secondary | ICD-10-CM | POA: Diagnosis not present

## 2011-12-13 DIAGNOSIS — N814 Uterovaginal prolapse, unspecified: Secondary | ICD-10-CM | POA: Diagnosis not present

## 2011-12-13 DIAGNOSIS — I10 Essential (primary) hypertension: Secondary | ICD-10-CM | POA: Diagnosis not present

## 2011-12-13 DIAGNOSIS — R262 Difficulty in walking, not elsewhere classified: Secondary | ICD-10-CM | POA: Diagnosis not present

## 2011-12-17 DIAGNOSIS — J984 Other disorders of lung: Secondary | ICD-10-CM | POA: Diagnosis not present

## 2011-12-17 DIAGNOSIS — M4800 Spinal stenosis, site unspecified: Secondary | ICD-10-CM | POA: Diagnosis not present

## 2011-12-17 DIAGNOSIS — K219 Gastro-esophageal reflux disease without esophagitis: Secondary | ICD-10-CM | POA: Diagnosis not present

## 2011-12-17 DIAGNOSIS — R262 Difficulty in walking, not elsewhere classified: Secondary | ICD-10-CM | POA: Diagnosis not present

## 2011-12-17 DIAGNOSIS — I10 Essential (primary) hypertension: Secondary | ICD-10-CM | POA: Diagnosis not present

## 2011-12-17 DIAGNOSIS — R0989 Other specified symptoms and signs involving the circulatory and respiratory systems: Secondary | ICD-10-CM | POA: Diagnosis not present

## 2011-12-17 DIAGNOSIS — M549 Dorsalgia, unspecified: Secondary | ICD-10-CM | POA: Diagnosis not present

## 2011-12-17 DIAGNOSIS — IMO0002 Reserved for concepts with insufficient information to code with codable children: Secondary | ICD-10-CM | POA: Diagnosis not present

## 2011-12-17 DIAGNOSIS — N814 Uterovaginal prolapse, unspecified: Secondary | ICD-10-CM | POA: Diagnosis not present

## 2011-12-18 DIAGNOSIS — R0989 Other specified symptoms and signs involving the circulatory and respiratory systems: Secondary | ICD-10-CM | POA: Diagnosis not present

## 2011-12-18 DIAGNOSIS — N814 Uterovaginal prolapse, unspecified: Secondary | ICD-10-CM | POA: Diagnosis not present

## 2011-12-18 DIAGNOSIS — K219 Gastro-esophageal reflux disease without esophagitis: Secondary | ICD-10-CM | POA: Diagnosis not present

## 2011-12-18 DIAGNOSIS — R262 Difficulty in walking, not elsewhere classified: Secondary | ICD-10-CM | POA: Diagnosis not present

## 2011-12-18 DIAGNOSIS — J984 Other disorders of lung: Secondary | ICD-10-CM | POA: Diagnosis not present

## 2011-12-18 DIAGNOSIS — I10 Essential (primary) hypertension: Secondary | ICD-10-CM | POA: Diagnosis not present

## 2011-12-20 DIAGNOSIS — L570 Actinic keratosis: Secondary | ICD-10-CM | POA: Diagnosis not present

## 2011-12-20 DIAGNOSIS — L259 Unspecified contact dermatitis, unspecified cause: Secondary | ICD-10-CM | POA: Diagnosis not present

## 2011-12-24 DIAGNOSIS — N814 Uterovaginal prolapse, unspecified: Secondary | ICD-10-CM | POA: Diagnosis not present

## 2011-12-24 DIAGNOSIS — J984 Other disorders of lung: Secondary | ICD-10-CM | POA: Diagnosis not present

## 2011-12-24 DIAGNOSIS — R262 Difficulty in walking, not elsewhere classified: Secondary | ICD-10-CM | POA: Diagnosis not present

## 2011-12-24 DIAGNOSIS — K219 Gastro-esophageal reflux disease without esophagitis: Secondary | ICD-10-CM | POA: Diagnosis not present

## 2011-12-24 DIAGNOSIS — I10 Essential (primary) hypertension: Secondary | ICD-10-CM | POA: Diagnosis not present

## 2011-12-24 DIAGNOSIS — R0989 Other specified symptoms and signs involving the circulatory and respiratory systems: Secondary | ICD-10-CM | POA: Diagnosis not present

## 2011-12-25 DIAGNOSIS — K219 Gastro-esophageal reflux disease without esophagitis: Secondary | ICD-10-CM | POA: Diagnosis not present

## 2011-12-25 DIAGNOSIS — J984 Other disorders of lung: Secondary | ICD-10-CM | POA: Diagnosis not present

## 2011-12-25 DIAGNOSIS — R262 Difficulty in walking, not elsewhere classified: Secondary | ICD-10-CM | POA: Diagnosis not present

## 2011-12-25 DIAGNOSIS — I10 Essential (primary) hypertension: Secondary | ICD-10-CM | POA: Diagnosis not present

## 2011-12-25 DIAGNOSIS — N814 Uterovaginal prolapse, unspecified: Secondary | ICD-10-CM | POA: Diagnosis not present

## 2011-12-25 DIAGNOSIS — R0609 Other forms of dyspnea: Secondary | ICD-10-CM | POA: Diagnosis not present

## 2011-12-27 DIAGNOSIS — R0609 Other forms of dyspnea: Secondary | ICD-10-CM | POA: Diagnosis not present

## 2011-12-27 DIAGNOSIS — R0989 Other specified symptoms and signs involving the circulatory and respiratory systems: Secondary | ICD-10-CM | POA: Diagnosis not present

## 2011-12-27 DIAGNOSIS — I10 Essential (primary) hypertension: Secondary | ICD-10-CM | POA: Diagnosis not present

## 2011-12-27 DIAGNOSIS — R262 Difficulty in walking, not elsewhere classified: Secondary | ICD-10-CM | POA: Diagnosis not present

## 2011-12-27 DIAGNOSIS — K219 Gastro-esophageal reflux disease without esophagitis: Secondary | ICD-10-CM | POA: Diagnosis not present

## 2011-12-27 DIAGNOSIS — J984 Other disorders of lung: Secondary | ICD-10-CM | POA: Diagnosis not present

## 2011-12-27 DIAGNOSIS — N814 Uterovaginal prolapse, unspecified: Secondary | ICD-10-CM | POA: Diagnosis not present

## 2011-12-30 DIAGNOSIS — J984 Other disorders of lung: Secondary | ICD-10-CM | POA: Diagnosis not present

## 2011-12-30 DIAGNOSIS — R262 Difficulty in walking, not elsewhere classified: Secondary | ICD-10-CM | POA: Diagnosis not present

## 2011-12-30 DIAGNOSIS — R0609 Other forms of dyspnea: Secondary | ICD-10-CM | POA: Diagnosis not present

## 2011-12-30 DIAGNOSIS — N814 Uterovaginal prolapse, unspecified: Secondary | ICD-10-CM | POA: Diagnosis not present

## 2011-12-30 DIAGNOSIS — I10 Essential (primary) hypertension: Secondary | ICD-10-CM | POA: Diagnosis not present

## 2011-12-30 DIAGNOSIS — K219 Gastro-esophageal reflux disease without esophagitis: Secondary | ICD-10-CM | POA: Diagnosis not present

## 2012-01-01 DIAGNOSIS — Z23 Encounter for immunization: Secondary | ICD-10-CM | POA: Diagnosis not present

## 2012-01-01 DIAGNOSIS — I10 Essential (primary) hypertension: Secondary | ICD-10-CM | POA: Diagnosis not present

## 2012-01-01 DIAGNOSIS — F411 Generalized anxiety disorder: Secondary | ICD-10-CM | POA: Diagnosis not present

## 2012-01-01 DIAGNOSIS — K219 Gastro-esophageal reflux disease without esophagitis: Secondary | ICD-10-CM | POA: Diagnosis not present

## 2012-01-01 DIAGNOSIS — N951 Menopausal and female climacteric states: Secondary | ICD-10-CM | POA: Diagnosis not present

## 2012-01-01 DIAGNOSIS — R262 Difficulty in walking, not elsewhere classified: Secondary | ICD-10-CM | POA: Diagnosis not present

## 2012-01-01 DIAGNOSIS — J984 Other disorders of lung: Secondary | ICD-10-CM | POA: Diagnosis not present

## 2012-01-01 DIAGNOSIS — R238 Other skin changes: Secondary | ICD-10-CM | POA: Diagnosis not present

## 2012-01-01 DIAGNOSIS — R0609 Other forms of dyspnea: Secondary | ICD-10-CM | POA: Diagnosis not present

## 2012-01-01 DIAGNOSIS — N814 Uterovaginal prolapse, unspecified: Secondary | ICD-10-CM | POA: Diagnosis not present

## 2012-01-02 DIAGNOSIS — J984 Other disorders of lung: Secondary | ICD-10-CM | POA: Diagnosis not present

## 2012-01-02 DIAGNOSIS — I10 Essential (primary) hypertension: Secondary | ICD-10-CM | POA: Diagnosis not present

## 2012-01-02 DIAGNOSIS — R262 Difficulty in walking, not elsewhere classified: Secondary | ICD-10-CM | POA: Diagnosis not present

## 2012-01-02 DIAGNOSIS — N814 Uterovaginal prolapse, unspecified: Secondary | ICD-10-CM | POA: Diagnosis not present

## 2012-01-02 DIAGNOSIS — K219 Gastro-esophageal reflux disease without esophagitis: Secondary | ICD-10-CM | POA: Diagnosis not present

## 2012-01-02 DIAGNOSIS — R0989 Other specified symptoms and signs involving the circulatory and respiratory systems: Secondary | ICD-10-CM | POA: Diagnosis not present

## 2012-01-06 DIAGNOSIS — R262 Difficulty in walking, not elsewhere classified: Secondary | ICD-10-CM | POA: Diagnosis not present

## 2012-01-06 DIAGNOSIS — I10 Essential (primary) hypertension: Secondary | ICD-10-CM | POA: Diagnosis not present

## 2012-01-06 DIAGNOSIS — J984 Other disorders of lung: Secondary | ICD-10-CM | POA: Diagnosis not present

## 2012-01-06 DIAGNOSIS — R0989 Other specified symptoms and signs involving the circulatory and respiratory systems: Secondary | ICD-10-CM | POA: Diagnosis not present

## 2012-01-06 DIAGNOSIS — N814 Uterovaginal prolapse, unspecified: Secondary | ICD-10-CM | POA: Diagnosis not present

## 2012-01-06 DIAGNOSIS — K219 Gastro-esophageal reflux disease without esophagitis: Secondary | ICD-10-CM | POA: Diagnosis not present

## 2012-01-08 DIAGNOSIS — K219 Gastro-esophageal reflux disease without esophagitis: Secondary | ICD-10-CM | POA: Diagnosis not present

## 2012-01-08 DIAGNOSIS — R0609 Other forms of dyspnea: Secondary | ICD-10-CM | POA: Diagnosis not present

## 2012-01-08 DIAGNOSIS — J984 Other disorders of lung: Secondary | ICD-10-CM | POA: Diagnosis not present

## 2012-01-08 DIAGNOSIS — I10 Essential (primary) hypertension: Secondary | ICD-10-CM | POA: Diagnosis not present

## 2012-01-08 DIAGNOSIS — R262 Difficulty in walking, not elsewhere classified: Secondary | ICD-10-CM | POA: Diagnosis not present

## 2012-01-08 DIAGNOSIS — N814 Uterovaginal prolapse, unspecified: Secondary | ICD-10-CM | POA: Diagnosis not present

## 2012-01-10 DIAGNOSIS — I10 Essential (primary) hypertension: Secondary | ICD-10-CM | POA: Diagnosis not present

## 2012-01-10 DIAGNOSIS — N814 Uterovaginal prolapse, unspecified: Secondary | ICD-10-CM | POA: Diagnosis not present

## 2012-01-10 DIAGNOSIS — R262 Difficulty in walking, not elsewhere classified: Secondary | ICD-10-CM | POA: Diagnosis not present

## 2012-01-10 DIAGNOSIS — K219 Gastro-esophageal reflux disease without esophagitis: Secondary | ICD-10-CM | POA: Diagnosis not present

## 2012-01-10 DIAGNOSIS — J984 Other disorders of lung: Secondary | ICD-10-CM | POA: Diagnosis not present

## 2012-01-10 DIAGNOSIS — R0609 Other forms of dyspnea: Secondary | ICD-10-CM | POA: Diagnosis not present

## 2012-01-13 DIAGNOSIS — R0989 Other specified symptoms and signs involving the circulatory and respiratory systems: Secondary | ICD-10-CM | POA: Diagnosis not present

## 2012-01-13 DIAGNOSIS — N814 Uterovaginal prolapse, unspecified: Secondary | ICD-10-CM | POA: Diagnosis not present

## 2012-01-13 DIAGNOSIS — R262 Difficulty in walking, not elsewhere classified: Secondary | ICD-10-CM | POA: Diagnosis not present

## 2012-01-13 DIAGNOSIS — J984 Other disorders of lung: Secondary | ICD-10-CM | POA: Diagnosis not present

## 2012-01-13 DIAGNOSIS — K219 Gastro-esophageal reflux disease without esophagitis: Secondary | ICD-10-CM | POA: Diagnosis not present

## 2012-01-13 DIAGNOSIS — I10 Essential (primary) hypertension: Secondary | ICD-10-CM | POA: Diagnosis not present

## 2012-01-15 DIAGNOSIS — K219 Gastro-esophageal reflux disease without esophagitis: Secondary | ICD-10-CM | POA: Diagnosis not present

## 2012-01-15 DIAGNOSIS — J984 Other disorders of lung: Secondary | ICD-10-CM | POA: Diagnosis not present

## 2012-01-15 DIAGNOSIS — I10 Essential (primary) hypertension: Secondary | ICD-10-CM | POA: Diagnosis not present

## 2012-01-15 DIAGNOSIS — N814 Uterovaginal prolapse, unspecified: Secondary | ICD-10-CM | POA: Diagnosis not present

## 2012-01-15 DIAGNOSIS — R0989 Other specified symptoms and signs involving the circulatory and respiratory systems: Secondary | ICD-10-CM | POA: Diagnosis not present

## 2012-01-15 DIAGNOSIS — R262 Difficulty in walking, not elsewhere classified: Secondary | ICD-10-CM | POA: Diagnosis not present

## 2012-01-17 ENCOUNTER — Other Ambulatory Visit: Payer: Self-pay | Admitting: Dermatology

## 2012-01-17 DIAGNOSIS — D0460 Carcinoma in situ of skin of unspecified upper limb, including shoulder: Secondary | ICD-10-CM | POA: Diagnosis not present

## 2012-01-17 DIAGNOSIS — A499 Bacterial infection, unspecified: Secondary | ICD-10-CM | POA: Diagnosis not present

## 2012-01-17 DIAGNOSIS — L259 Unspecified contact dermatitis, unspecified cause: Secondary | ICD-10-CM | POA: Diagnosis not present

## 2012-01-17 DIAGNOSIS — C44621 Squamous cell carcinoma of skin of unspecified upper limb, including shoulder: Secondary | ICD-10-CM | POA: Diagnosis not present

## 2012-01-21 DIAGNOSIS — N3020 Other chronic cystitis without hematuria: Secondary | ICD-10-CM | POA: Diagnosis not present

## 2012-01-23 DIAGNOSIS — Z79899 Other long term (current) drug therapy: Secondary | ICD-10-CM | POA: Diagnosis not present

## 2012-01-23 DIAGNOSIS — M1990 Unspecified osteoarthritis, unspecified site: Secondary | ICD-10-CM | POA: Diagnosis not present

## 2012-01-23 DIAGNOSIS — M2550 Pain in unspecified joint: Secondary | ICD-10-CM | POA: Diagnosis not present

## 2012-01-27 DIAGNOSIS — D0460 Carcinoma in situ of skin of unspecified upper limb, including shoulder: Secondary | ICD-10-CM | POA: Diagnosis not present

## 2012-01-28 DIAGNOSIS — F411 Generalized anxiety disorder: Secondary | ICD-10-CM | POA: Diagnosis not present

## 2012-02-06 DIAGNOSIS — L259 Unspecified contact dermatitis, unspecified cause: Secondary | ICD-10-CM | POA: Diagnosis not present

## 2012-03-02 DIAGNOSIS — L570 Actinic keratosis: Secondary | ICD-10-CM | POA: Diagnosis not present

## 2012-03-02 DIAGNOSIS — L259 Unspecified contact dermatitis, unspecified cause: Secondary | ICD-10-CM | POA: Diagnosis not present

## 2012-03-04 DIAGNOSIS — F329 Major depressive disorder, single episode, unspecified: Secondary | ICD-10-CM | POA: Diagnosis not present

## 2012-03-04 DIAGNOSIS — D500 Iron deficiency anemia secondary to blood loss (chronic): Secondary | ICD-10-CM | POA: Diagnosis not present

## 2012-03-04 DIAGNOSIS — R259 Unspecified abnormal involuntary movements: Secondary | ICD-10-CM | POA: Diagnosis not present

## 2012-03-04 DIAGNOSIS — E785 Hyperlipidemia, unspecified: Secondary | ICD-10-CM | POA: Diagnosis not present

## 2012-03-04 DIAGNOSIS — Z79899 Other long term (current) drug therapy: Secondary | ICD-10-CM | POA: Diagnosis not present

## 2012-03-04 DIAGNOSIS — I10 Essential (primary) hypertension: Secondary | ICD-10-CM | POA: Diagnosis not present

## 2012-03-30 DIAGNOSIS — R0602 Shortness of breath: Secondary | ICD-10-CM | POA: Diagnosis not present

## 2012-04-03 DIAGNOSIS — R0602 Shortness of breath: Secondary | ICD-10-CM | POA: Diagnosis not present

## 2012-04-21 ENCOUNTER — Other Ambulatory Visit (HOSPITAL_COMMUNITY): Payer: Self-pay | Admitting: Interventional Cardiology

## 2012-04-21 DIAGNOSIS — R0602 Shortness of breath: Secondary | ICD-10-CM

## 2012-04-30 ENCOUNTER — Encounter (HOSPITAL_COMMUNITY): Payer: Medicare Other

## 2012-05-01 ENCOUNTER — Encounter (HOSPITAL_COMMUNITY): Payer: Medicare Other

## 2012-05-04 DIAGNOSIS — N8111 Cystocele, midline: Secondary | ICD-10-CM | POA: Diagnosis not present

## 2012-05-05 ENCOUNTER — Ambulatory Visit (HOSPITAL_COMMUNITY)
Admission: RE | Admit: 2012-05-05 | Discharge: 2012-05-05 | Disposition: A | Discharge status: Home / Self Care | Payer: Medicare Other | Source: Ambulatory Visit | Attending: Interventional Cardiology | Admitting: Interventional Cardiology

## 2012-05-05 DIAGNOSIS — R0602 Shortness of breath: Secondary | ICD-10-CM | POA: Diagnosis not present

## 2012-05-05 MED ORDER — ALBUTEROL SULFATE (5 MG/ML) 0.5% IN NEBU
2.5000 mg | INHALATION_SOLUTION | Freq: Once | RESPIRATORY_TRACT | Status: AC
  Administered 2012-05-05: 2.5 mg via RESPIRATORY_TRACT

## 2012-05-07 DIAGNOSIS — L570 Actinic keratosis: Secondary | ICD-10-CM | POA: Diagnosis not present

## 2012-06-24 DIAGNOSIS — R238 Other skin changes: Secondary | ICD-10-CM | POA: Diagnosis not present

## 2012-06-24 DIAGNOSIS — R5383 Other fatigue: Secondary | ICD-10-CM | POA: Diagnosis not present

## 2012-06-24 DIAGNOSIS — R5381 Other malaise: Secondary | ICD-10-CM | POA: Diagnosis not present

## 2012-07-07 DIAGNOSIS — R350 Frequency of micturition: Secondary | ICD-10-CM | POA: Diagnosis not present

## 2012-07-07 DIAGNOSIS — F411 Generalized anxiety disorder: Secondary | ICD-10-CM | POA: Diagnosis not present

## 2012-07-07 DIAGNOSIS — N390 Urinary tract infection, site not specified: Secondary | ICD-10-CM | POA: Diagnosis not present

## 2012-07-29 DIAGNOSIS — Z961 Presence of intraocular lens: Secondary | ICD-10-CM | POA: Diagnosis not present

## 2012-07-29 DIAGNOSIS — H52209 Unspecified astigmatism, unspecified eye: Secondary | ICD-10-CM | POA: Diagnosis not present

## 2012-08-05 DIAGNOSIS — M2550 Pain in unspecified joint: Secondary | ICD-10-CM | POA: Diagnosis not present

## 2012-08-05 DIAGNOSIS — R233 Spontaneous ecchymoses: Secondary | ICD-10-CM | POA: Diagnosis not present

## 2012-08-05 DIAGNOSIS — M1990 Unspecified osteoarthritis, unspecified site: Secondary | ICD-10-CM | POA: Diagnosis not present

## 2012-08-21 DIAGNOSIS — R293 Abnormal posture: Secondary | ICD-10-CM | POA: Diagnosis not present

## 2012-08-24 DIAGNOSIS — R293 Abnormal posture: Secondary | ICD-10-CM | POA: Diagnosis not present

## 2012-08-26 DIAGNOSIS — R293 Abnormal posture: Secondary | ICD-10-CM | POA: Diagnosis not present

## 2012-08-28 DIAGNOSIS — R293 Abnormal posture: Secondary | ICD-10-CM | POA: Diagnosis not present

## 2012-08-31 DIAGNOSIS — L570 Actinic keratosis: Secondary | ICD-10-CM | POA: Diagnosis not present

## 2012-08-31 DIAGNOSIS — L578 Other skin changes due to chronic exposure to nonionizing radiation: Secondary | ICD-10-CM | POA: Diagnosis not present

## 2012-08-31 DIAGNOSIS — R293 Abnormal posture: Secondary | ICD-10-CM | POA: Diagnosis not present

## 2012-09-02 DIAGNOSIS — R293 Abnormal posture: Secondary | ICD-10-CM | POA: Diagnosis not present

## 2012-09-04 DIAGNOSIS — R293 Abnormal posture: Secondary | ICD-10-CM | POA: Diagnosis not present

## 2012-09-07 DIAGNOSIS — R293 Abnormal posture: Secondary | ICD-10-CM | POA: Diagnosis not present

## 2012-09-09 DIAGNOSIS — R293 Abnormal posture: Secondary | ICD-10-CM | POA: Diagnosis not present

## 2012-09-10 DIAGNOSIS — F411 Generalized anxiety disorder: Secondary | ICD-10-CM | POA: Diagnosis not present

## 2012-09-11 DIAGNOSIS — R293 Abnormal posture: Secondary | ICD-10-CM | POA: Diagnosis not present

## 2012-09-14 DIAGNOSIS — R293 Abnormal posture: Secondary | ICD-10-CM | POA: Diagnosis not present

## 2012-09-16 DIAGNOSIS — M4800 Spinal stenosis, site unspecified: Secondary | ICD-10-CM | POA: Diagnosis not present

## 2012-09-16 DIAGNOSIS — N814 Uterovaginal prolapse, unspecified: Secondary | ICD-10-CM | POA: Diagnosis not present

## 2012-09-16 DIAGNOSIS — IMO0002 Reserved for concepts with insufficient information to code with codable children: Secondary | ICD-10-CM | POA: Diagnosis not present

## 2012-09-16 DIAGNOSIS — F411 Generalized anxiety disorder: Secondary | ICD-10-CM | POA: Diagnosis not present

## 2012-09-16 DIAGNOSIS — I10 Essential (primary) hypertension: Secondary | ICD-10-CM | POA: Diagnosis not present

## 2012-09-16 DIAGNOSIS — M6281 Muscle weakness (generalized): Secondary | ICD-10-CM | POA: Diagnosis not present

## 2012-09-16 DIAGNOSIS — K219 Gastro-esophageal reflux disease without esophagitis: Secondary | ICD-10-CM | POA: Diagnosis not present

## 2012-09-16 DIAGNOSIS — J984 Other disorders of lung: Secondary | ICD-10-CM | POA: Diagnosis not present

## 2012-09-16 DIAGNOSIS — R293 Abnormal posture: Secondary | ICD-10-CM | POA: Diagnosis not present

## 2012-09-21 DIAGNOSIS — M6281 Muscle weakness (generalized): Secondary | ICD-10-CM | POA: Diagnosis not present

## 2012-09-21 DIAGNOSIS — K219 Gastro-esophageal reflux disease without esophagitis: Secondary | ICD-10-CM | POA: Diagnosis not present

## 2012-09-21 DIAGNOSIS — I10 Essential (primary) hypertension: Secondary | ICD-10-CM | POA: Diagnosis not present

## 2012-09-21 DIAGNOSIS — J984 Other disorders of lung: Secondary | ICD-10-CM | POA: Diagnosis not present

## 2012-09-21 DIAGNOSIS — R293 Abnormal posture: Secondary | ICD-10-CM | POA: Diagnosis not present

## 2012-09-21 DIAGNOSIS — N814 Uterovaginal prolapse, unspecified: Secondary | ICD-10-CM | POA: Diagnosis not present

## 2012-09-23 DIAGNOSIS — R293 Abnormal posture: Secondary | ICD-10-CM | POA: Diagnosis not present

## 2012-09-23 DIAGNOSIS — M6281 Muscle weakness (generalized): Secondary | ICD-10-CM | POA: Diagnosis not present

## 2012-09-23 DIAGNOSIS — J984 Other disorders of lung: Secondary | ICD-10-CM | POA: Diagnosis not present

## 2012-09-23 DIAGNOSIS — N814 Uterovaginal prolapse, unspecified: Secondary | ICD-10-CM | POA: Diagnosis not present

## 2012-09-23 DIAGNOSIS — K219 Gastro-esophageal reflux disease without esophagitis: Secondary | ICD-10-CM | POA: Diagnosis not present

## 2012-09-23 DIAGNOSIS — I10 Essential (primary) hypertension: Secondary | ICD-10-CM | POA: Diagnosis not present

## 2012-09-25 DIAGNOSIS — M6281 Muscle weakness (generalized): Secondary | ICD-10-CM | POA: Diagnosis not present

## 2012-09-25 DIAGNOSIS — N814 Uterovaginal prolapse, unspecified: Secondary | ICD-10-CM | POA: Diagnosis not present

## 2012-09-25 DIAGNOSIS — J984 Other disorders of lung: Secondary | ICD-10-CM | POA: Diagnosis not present

## 2012-09-25 DIAGNOSIS — I10 Essential (primary) hypertension: Secondary | ICD-10-CM | POA: Diagnosis not present

## 2012-09-25 DIAGNOSIS — K219 Gastro-esophageal reflux disease without esophagitis: Secondary | ICD-10-CM | POA: Diagnosis not present

## 2012-09-25 DIAGNOSIS — R293 Abnormal posture: Secondary | ICD-10-CM | POA: Diagnosis not present

## 2012-09-28 DIAGNOSIS — R293 Abnormal posture: Secondary | ICD-10-CM | POA: Diagnosis not present

## 2012-09-28 DIAGNOSIS — M6281 Muscle weakness (generalized): Secondary | ICD-10-CM | POA: Diagnosis not present

## 2012-09-28 DIAGNOSIS — I10 Essential (primary) hypertension: Secondary | ICD-10-CM | POA: Diagnosis not present

## 2012-09-28 DIAGNOSIS — K219 Gastro-esophageal reflux disease without esophagitis: Secondary | ICD-10-CM | POA: Diagnosis not present

## 2012-09-28 DIAGNOSIS — J984 Other disorders of lung: Secondary | ICD-10-CM | POA: Diagnosis not present

## 2012-09-28 DIAGNOSIS — N814 Uterovaginal prolapse, unspecified: Secondary | ICD-10-CM | POA: Diagnosis not present

## 2012-09-30 DIAGNOSIS — J984 Other disorders of lung: Secondary | ICD-10-CM | POA: Diagnosis not present

## 2012-09-30 DIAGNOSIS — R293 Abnormal posture: Secondary | ICD-10-CM | POA: Diagnosis not present

## 2012-09-30 DIAGNOSIS — K219 Gastro-esophageal reflux disease without esophagitis: Secondary | ICD-10-CM | POA: Diagnosis not present

## 2012-09-30 DIAGNOSIS — N814 Uterovaginal prolapse, unspecified: Secondary | ICD-10-CM | POA: Diagnosis not present

## 2012-09-30 DIAGNOSIS — I10 Essential (primary) hypertension: Secondary | ICD-10-CM | POA: Diagnosis not present

## 2012-09-30 DIAGNOSIS — M6281 Muscle weakness (generalized): Secondary | ICD-10-CM | POA: Diagnosis not present

## 2012-10-02 DIAGNOSIS — M6281 Muscle weakness (generalized): Secondary | ICD-10-CM | POA: Diagnosis not present

## 2012-10-02 DIAGNOSIS — K219 Gastro-esophageal reflux disease without esophagitis: Secondary | ICD-10-CM | POA: Diagnosis not present

## 2012-10-02 DIAGNOSIS — J984 Other disorders of lung: Secondary | ICD-10-CM | POA: Diagnosis not present

## 2012-10-02 DIAGNOSIS — R293 Abnormal posture: Secondary | ICD-10-CM | POA: Diagnosis not present

## 2012-10-02 DIAGNOSIS — N814 Uterovaginal prolapse, unspecified: Secondary | ICD-10-CM | POA: Diagnosis not present

## 2012-10-02 DIAGNOSIS — I10 Essential (primary) hypertension: Secondary | ICD-10-CM | POA: Diagnosis not present

## 2012-10-05 DIAGNOSIS — I10 Essential (primary) hypertension: Secondary | ICD-10-CM | POA: Diagnosis not present

## 2012-10-05 DIAGNOSIS — R293 Abnormal posture: Secondary | ICD-10-CM | POA: Diagnosis not present

## 2012-10-05 DIAGNOSIS — K219 Gastro-esophageal reflux disease without esophagitis: Secondary | ICD-10-CM | POA: Diagnosis not present

## 2012-10-05 DIAGNOSIS — M6281 Muscle weakness (generalized): Secondary | ICD-10-CM | POA: Diagnosis not present

## 2012-10-05 DIAGNOSIS — J984 Other disorders of lung: Secondary | ICD-10-CM | POA: Diagnosis not present

## 2012-10-05 DIAGNOSIS — N814 Uterovaginal prolapse, unspecified: Secondary | ICD-10-CM | POA: Diagnosis not present

## 2012-10-07 DIAGNOSIS — R293 Abnormal posture: Secondary | ICD-10-CM | POA: Diagnosis not present

## 2012-10-07 DIAGNOSIS — M6281 Muscle weakness (generalized): Secondary | ICD-10-CM | POA: Diagnosis not present

## 2012-10-07 DIAGNOSIS — J984 Other disorders of lung: Secondary | ICD-10-CM | POA: Diagnosis not present

## 2012-10-07 DIAGNOSIS — I10 Essential (primary) hypertension: Secondary | ICD-10-CM | POA: Diagnosis not present

## 2012-10-07 DIAGNOSIS — K219 Gastro-esophageal reflux disease without esophagitis: Secondary | ICD-10-CM | POA: Diagnosis not present

## 2012-10-07 DIAGNOSIS — N814 Uterovaginal prolapse, unspecified: Secondary | ICD-10-CM | POA: Diagnosis not present

## 2012-10-09 DIAGNOSIS — K219 Gastro-esophageal reflux disease without esophagitis: Secondary | ICD-10-CM | POA: Diagnosis not present

## 2012-10-09 DIAGNOSIS — M6281 Muscle weakness (generalized): Secondary | ICD-10-CM | POA: Diagnosis not present

## 2012-10-09 DIAGNOSIS — I10 Essential (primary) hypertension: Secondary | ICD-10-CM | POA: Diagnosis not present

## 2012-10-09 DIAGNOSIS — J984 Other disorders of lung: Secondary | ICD-10-CM | POA: Diagnosis not present

## 2012-10-09 DIAGNOSIS — R293 Abnormal posture: Secondary | ICD-10-CM | POA: Diagnosis not present

## 2012-10-09 DIAGNOSIS — N814 Uterovaginal prolapse, unspecified: Secondary | ICD-10-CM | POA: Diagnosis not present

## 2012-10-12 DIAGNOSIS — K219 Gastro-esophageal reflux disease without esophagitis: Secondary | ICD-10-CM | POA: Diagnosis not present

## 2012-10-12 DIAGNOSIS — I10 Essential (primary) hypertension: Secondary | ICD-10-CM | POA: Diagnosis not present

## 2012-10-12 DIAGNOSIS — M6281 Muscle weakness (generalized): Secondary | ICD-10-CM | POA: Diagnosis not present

## 2012-10-12 DIAGNOSIS — R293 Abnormal posture: Secondary | ICD-10-CM | POA: Diagnosis not present

## 2012-10-12 DIAGNOSIS — J984 Other disorders of lung: Secondary | ICD-10-CM | POA: Diagnosis not present

## 2012-10-12 DIAGNOSIS — N814 Uterovaginal prolapse, unspecified: Secondary | ICD-10-CM | POA: Diagnosis not present

## 2012-10-13 DIAGNOSIS — R293 Abnormal posture: Secondary | ICD-10-CM | POA: Diagnosis not present

## 2012-10-13 DIAGNOSIS — I10 Essential (primary) hypertension: Secondary | ICD-10-CM | POA: Diagnosis not present

## 2012-10-13 DIAGNOSIS — N814 Uterovaginal prolapse, unspecified: Secondary | ICD-10-CM | POA: Diagnosis not present

## 2012-10-13 DIAGNOSIS — J984 Other disorders of lung: Secondary | ICD-10-CM | POA: Diagnosis not present

## 2012-10-13 DIAGNOSIS — M6281 Muscle weakness (generalized): Secondary | ICD-10-CM | POA: Diagnosis not present

## 2012-10-13 DIAGNOSIS — K219 Gastro-esophageal reflux disease without esophagitis: Secondary | ICD-10-CM | POA: Diagnosis not present

## 2012-10-14 DIAGNOSIS — K219 Gastro-esophageal reflux disease without esophagitis: Secondary | ICD-10-CM | POA: Diagnosis not present

## 2012-10-14 DIAGNOSIS — M6281 Muscle weakness (generalized): Secondary | ICD-10-CM | POA: Diagnosis not present

## 2012-10-14 DIAGNOSIS — R293 Abnormal posture: Secondary | ICD-10-CM | POA: Diagnosis not present

## 2012-10-14 DIAGNOSIS — J984 Other disorders of lung: Secondary | ICD-10-CM | POA: Diagnosis not present

## 2012-10-14 DIAGNOSIS — I10 Essential (primary) hypertension: Secondary | ICD-10-CM | POA: Diagnosis not present

## 2012-10-14 DIAGNOSIS — N814 Uterovaginal prolapse, unspecified: Secondary | ICD-10-CM | POA: Diagnosis not present

## 2012-10-28 DIAGNOSIS — R279 Unspecified lack of coordination: Secondary | ICD-10-CM | POA: Diagnosis not present

## 2012-10-28 DIAGNOSIS — R0609 Other forms of dyspnea: Secondary | ICD-10-CM | POA: Diagnosis not present

## 2012-10-28 DIAGNOSIS — R269 Unspecified abnormalities of gait and mobility: Secondary | ICD-10-CM | POA: Diagnosis not present

## 2012-10-28 DIAGNOSIS — R29818 Other symptoms and signs involving the nervous system: Secondary | ICD-10-CM | POA: Diagnosis not present

## 2012-10-30 DIAGNOSIS — R29818 Other symptoms and signs involving the nervous system: Secondary | ICD-10-CM | POA: Diagnosis not present

## 2012-10-30 DIAGNOSIS — R269 Unspecified abnormalities of gait and mobility: Secondary | ICD-10-CM | POA: Diagnosis not present

## 2012-10-30 DIAGNOSIS — R0989 Other specified symptoms and signs involving the circulatory and respiratory systems: Secondary | ICD-10-CM | POA: Diagnosis not present

## 2012-10-30 DIAGNOSIS — R279 Unspecified lack of coordination: Secondary | ICD-10-CM | POA: Diagnosis not present

## 2012-11-02 DIAGNOSIS — R0609 Other forms of dyspnea: Secondary | ICD-10-CM | POA: Diagnosis not present

## 2012-11-02 DIAGNOSIS — R29818 Other symptoms and signs involving the nervous system: Secondary | ICD-10-CM | POA: Diagnosis not present

## 2012-11-02 DIAGNOSIS — R269 Unspecified abnormalities of gait and mobility: Secondary | ICD-10-CM | POA: Diagnosis not present

## 2012-11-02 DIAGNOSIS — R279 Unspecified lack of coordination: Secondary | ICD-10-CM | POA: Diagnosis not present

## 2012-11-04 DIAGNOSIS — R269 Unspecified abnormalities of gait and mobility: Secondary | ICD-10-CM | POA: Diagnosis not present

## 2012-11-04 DIAGNOSIS — R279 Unspecified lack of coordination: Secondary | ICD-10-CM | POA: Diagnosis not present

## 2012-11-04 DIAGNOSIS — R0609 Other forms of dyspnea: Secondary | ICD-10-CM | POA: Diagnosis not present

## 2012-11-04 DIAGNOSIS — R29818 Other symptoms and signs involving the nervous system: Secondary | ICD-10-CM | POA: Diagnosis not present

## 2012-11-06 DIAGNOSIS — R29818 Other symptoms and signs involving the nervous system: Secondary | ICD-10-CM | POA: Diagnosis not present

## 2012-11-06 DIAGNOSIS — R269 Unspecified abnormalities of gait and mobility: Secondary | ICD-10-CM | POA: Diagnosis not present

## 2012-11-06 DIAGNOSIS — R279 Unspecified lack of coordination: Secondary | ICD-10-CM | POA: Diagnosis not present

## 2012-11-06 DIAGNOSIS — R0609 Other forms of dyspnea: Secondary | ICD-10-CM | POA: Diagnosis not present

## 2012-11-09 DIAGNOSIS — R29818 Other symptoms and signs involving the nervous system: Secondary | ICD-10-CM | POA: Diagnosis not present

## 2012-11-09 DIAGNOSIS — R279 Unspecified lack of coordination: Secondary | ICD-10-CM | POA: Diagnosis not present

## 2012-11-09 DIAGNOSIS — R269 Unspecified abnormalities of gait and mobility: Secondary | ICD-10-CM | POA: Diagnosis not present

## 2012-11-09 DIAGNOSIS — R0609 Other forms of dyspnea: Secondary | ICD-10-CM | POA: Diagnosis not present

## 2012-11-11 DIAGNOSIS — R0609 Other forms of dyspnea: Secondary | ICD-10-CM | POA: Diagnosis not present

## 2012-11-11 DIAGNOSIS — R269 Unspecified abnormalities of gait and mobility: Secondary | ICD-10-CM | POA: Diagnosis not present

## 2012-11-11 DIAGNOSIS — R29818 Other symptoms and signs involving the nervous system: Secondary | ICD-10-CM | POA: Diagnosis not present

## 2012-11-11 DIAGNOSIS — R279 Unspecified lack of coordination: Secondary | ICD-10-CM | POA: Diagnosis not present

## 2012-11-12 DIAGNOSIS — E785 Hyperlipidemia, unspecified: Secondary | ICD-10-CM | POA: Diagnosis not present

## 2012-11-12 DIAGNOSIS — F411 Generalized anxiety disorder: Secondary | ICD-10-CM | POA: Diagnosis not present

## 2012-11-12 DIAGNOSIS — R259 Unspecified abnormal involuntary movements: Secondary | ICD-10-CM | POA: Diagnosis not present

## 2012-11-12 DIAGNOSIS — R5381 Other malaise: Secondary | ICD-10-CM | POA: Diagnosis not present

## 2012-11-13 DIAGNOSIS — R269 Unspecified abnormalities of gait and mobility: Secondary | ICD-10-CM | POA: Diagnosis not present

## 2012-11-13 DIAGNOSIS — R279 Unspecified lack of coordination: Secondary | ICD-10-CM | POA: Diagnosis not present

## 2012-11-13 DIAGNOSIS — R29818 Other symptoms and signs involving the nervous system: Secondary | ICD-10-CM | POA: Diagnosis not present

## 2012-11-13 DIAGNOSIS — R0609 Other forms of dyspnea: Secondary | ICD-10-CM | POA: Diagnosis not present

## 2012-12-10 ENCOUNTER — Encounter: Payer: Self-pay | Admitting: Neurology

## 2012-12-11 ENCOUNTER — Ambulatory Visit (INDEPENDENT_AMBULATORY_CARE_PROVIDER_SITE_OTHER): Payer: Medicare Other | Admitting: Neurology

## 2012-12-11 ENCOUNTER — Encounter: Payer: Self-pay | Admitting: Neurology

## 2012-12-11 VITALS — BP 153/83 | HR 66 | Ht 59.0 in | Wt 119.0 lb

## 2012-12-11 DIAGNOSIS — G250 Essential tremor: Secondary | ICD-10-CM

## 2012-12-11 DIAGNOSIS — G252 Other specified forms of tremor: Secondary | ICD-10-CM

## 2012-12-11 HISTORY — DX: Essential tremor: G25.2

## 2012-12-11 HISTORY — DX: Essential tremor: G25.0

## 2012-12-11 MED ORDER — PROPRANOLOL HCL 10 MG PO TABS: 10.0000 mg | ORAL_TABLET | Freq: Two times a day (BID) | ORAL | Status: DC

## 2012-12-11 NOTE — Progress Notes (Signed)
Reason for visit: Tremor  [name] is a 79 y.o. female  History of present illness:  [name] is an 79 year old right-handed white female who has been seen through this office previously for a gait disorder. The patient continues to have some issues with balance, but she notes that if she uses a cane, she can get around fairly well. The patient does not report any episodes of falling. The patient has a moderate level small vessel disease by MRI of the brain done approximately 2 years ago. Within the last 6 months, the patient has noted onset of tremors affecting both arms equally. The tremors affect her mainly when she is doing something with her hands, not at rest. The patient has had difficulty with handwriting, but she denies any significant issues with trying to feed herself at this point. The patient indicates that she also feels quite nervous and jittery. Thyroid studies have been done recently and were unremarkable. The patient was placed on Celexa, but she indicates that this medication has not helped her at all. The patient is sent to this office for further evaluation. The patient reports no new numbness or weakness of the extremities, no significant change in balance, or problems with dizziness.  Past Medical History  Diagnosis Date  . Pain in joint, upper arm   . Disturbance of skin sensation   . Other abnormal clinical finding   . Disturbance of skin sensation   . Unspecified vitamin D deficiency   . Tobacco use disorder   . Chronic airway obstruction, not elsewhere classified   . Hypertension   . GERD (gastroesophageal reflux disease)   . Anemia   . Chronic back pain   . Chronic cystitis   . Hernia   . Depression   . Anxiety   . Essential and other specified forms of tremor 12/11/2012  . Osteoporosis   . Degenerative arthritis   . History of cerebrovascular disease     Moderate level small vessel disease  . Gait disorder   . Dyslipidemia   . Hemorrhoids      Past Surgical History  Procedure Laterality Date  . Back surgery Bilateral     X2  . Appendectomy    . Cataract extraction Bilateral   . Bladder resuspension procedure    . Tonsillectomy    . Inguinal hernia repair Bilateral     Family History  Problem Relation Age of Onset  . Heart Problems Mother   . Diabetes Father   . Heart attack Father   . Cancer Sister     Brain tumor  . Emphysema Brother     Social history:  reports that she has never smoked. She has never used smokeless tobacco. She reports that she does not drink alcohol or use illicit drugs.  Medications:  Current Outpatient Prescriptions on File Prior to Visit  Medication Sig Dispense Refill  . aspirin EC 81 MG tablet Take 81 mg by mouth daily.      . Calcium Carbonate-Vitamin D (CALTRATE 600+D) 600-400 MG-UNIT per tablet Take 1 tablet by mouth daily.      . cholecalciferol (VITAMIN D) 1000 UNITS tablet Take 1,000 Units by mouth daily.      . citalopram (CELEXA) 10 MG tablet Take 20 mg by mouth daily. Take 1 and 1/2 daily      . diclofenac sodium (VOLTAREN) 1 % GEL Apply 4 g topically 4 (four) times daily.      . Misc Natural Products (OSTEO  BI-FLEX JOINT SHIELD) TABS Take 1 tablet by mouth daily.      . Multiple Vitamin (MULTIVITAMIN) tablet Take 1 tablet by mouth daily.      Marland Kitchen omeprazole (PRILOSEC OTC) 20 MG tablet Take 20 mg by mouth daily.       No current facility-administered medications on file prior to visit.      Allergies  Allergen Reactions  . Limbrel [Flavocoxid]     Unknown effects  . Sertraline Anxiety    ROS:  Out of a complete 14 system review of symptoms, the patient complains only of the following symptoms, and all other reviewed systems are negative.  Joint pain Weakness Dizziness, tremor Anxiety Decreased energy  Blood pressure 153/83, pulse 66, height 4\' 11"  (1.499 m), weight 119 lb (53.978 kg).  Physical Exam  General: The patient is alert and cooperative at the time  of the examination.  Head: Pupils are equal, round, and reactive to light. Discs are flat bilaterally.  Neck: The neck is supple, no carotid bruits are noted.  Respiratory: The respiratory examination is clear.  Cardiovascular: The cardiovascular examination reveals a regular rate and rhythm, no obvious murmurs or rubs are noted.  Skin: Extremities are without significant edema.  Neurologic Exam  Mental status:  Cranial nerves: Facial symmetry is present. There is good sensation of the face to pinprick and soft touch bilaterally. The strength of the facial muscles and the muscles to head turning and shoulder shrug are normal bilaterally. Speech is well enunciated, no aphasia or dysarthria is noted. Extraocular movements are full. Visual fields are full.  Motor: The motor testing reveals 5 over 5 strength of all 4 extremities. Good symmetric motor tone is noted throughout.  Sensory: Sensory testing is intact to pinprick, soft touch, vibration sensation, and position sense on all 4 extremities. No evidence of extinction is noted.  Coordination: Cerebellar testing reveals good finger-nose-finger and heel-to-shin bilaterally. An intention tremor seen with finger-nose-finger bilaterally.  Gait and station: Gait is slightly wide-based, unsteady. The patient uses a cane for ambulation. Tandem gait was not attempted. Romberg is negative. No drift is seen.  Reflexes: Deep tendon reflexes are symmetric, but are depressed bilaterally. Toes are downgoing bilaterally.   Assessment/Plan:  1. Essential tremor  2. Anxiety  3. Gait disorder  4. Small vessel disease, moderate level  The patient appears to have an essential tremor. The patient reports a lot of anxiety which may be exacerbating the tremor as well. The patient will be given a trial on low-dose propranolol to see this helps. The patient will contact me if she has side effects on this medication. The patient will otherwise followup  in 4 months.  Brooke Palau MD 12/11/2012 7:52 PM  [facility] [address] [place], [place] [address]  Phone [phone] Fax [phone]

## 2012-12-11 NOTE — Patient Instructions (Signed)
We will start propranolol for the tremor. Watch for dizziness, fatigue or depression.

## 2012-12-17 DIAGNOSIS — Z23 Encounter for immunization: Secondary | ICD-10-CM | POA: Diagnosis not present

## 2012-12-31 DIAGNOSIS — L570 Actinic keratosis: Secondary | ICD-10-CM | POA: Diagnosis not present

## 2013-01-07 DIAGNOSIS — F411 Generalized anxiety disorder: Secondary | ICD-10-CM | POA: Diagnosis not present

## 2013-01-07 DIAGNOSIS — G250 Essential tremor: Secondary | ICD-10-CM | POA: Diagnosis not present

## 2013-01-07 DIAGNOSIS — M129 Arthropathy, unspecified: Secondary | ICD-10-CM | POA: Diagnosis not present

## 2013-02-08 DIAGNOSIS — W19XXXA Unspecified fall, initial encounter: Secondary | ICD-10-CM | POA: Diagnosis not present

## 2013-02-08 DIAGNOSIS — M2550 Pain in unspecified joint: Secondary | ICD-10-CM | POA: Diagnosis not present

## 2013-02-08 DIAGNOSIS — M1990 Unspecified osteoarthritis, unspecified site: Secondary | ICD-10-CM | POA: Diagnosis not present

## 2013-02-09 ENCOUNTER — Other Ambulatory Visit: Payer: Self-pay | Admitting: Neurology

## 2013-02-22 ENCOUNTER — Ambulatory Visit
Admission: RE | Admit: 2013-02-22 | Discharge: 2013-02-22 | Disposition: A | Discharge status: Home / Self Care | Payer: Medicare Other | Source: Ambulatory Visit | Attending: Family Medicine | Admitting: Family Medicine

## 2013-02-22 ENCOUNTER — Other Ambulatory Visit: Payer: Self-pay | Admitting: Family Medicine

## 2013-02-22 DIAGNOSIS — M546 Pain in thoracic spine: Secondary | ICD-10-CM | POA: Diagnosis not present

## 2013-02-22 DIAGNOSIS — R42 Dizziness and giddiness: Secondary | ICD-10-CM | POA: Diagnosis not present

## 2013-02-22 DIAGNOSIS — S0990XA Unspecified injury of head, initial encounter: Secondary | ICD-10-CM

## 2013-02-22 DIAGNOSIS — M549 Dorsalgia, unspecified: Secondary | ICD-10-CM

## 2013-02-22 DIAGNOSIS — IMO0002 Reserved for concepts with insufficient information to code with codable children: Secondary | ICD-10-CM | POA: Diagnosis not present

## 2013-02-22 DIAGNOSIS — R29818 Other symptoms and signs involving the nervous system: Secondary | ICD-10-CM | POA: Diagnosis not present

## 2013-02-22 DIAGNOSIS — W19XXXA Unspecified fall, initial encounter: Secondary | ICD-10-CM | POA: Diagnosis not present

## 2013-02-22 DIAGNOSIS — R5381 Other malaise: Secondary | ICD-10-CM | POA: Diagnosis not present

## 2013-03-01 DIAGNOSIS — N814 Uterovaginal prolapse, unspecified: Secondary | ICD-10-CM | POA: Diagnosis not present

## 2013-03-01 DIAGNOSIS — M4800 Spinal stenosis, site unspecified: Secondary | ICD-10-CM | POA: Diagnosis not present

## 2013-03-01 DIAGNOSIS — IMO0002 Reserved for concepts with insufficient information to code with codable children: Secondary | ICD-10-CM | POA: Diagnosis not present

## 2013-03-01 DIAGNOSIS — R262 Difficulty in walking, not elsewhere classified: Secondary | ICD-10-CM | POA: Diagnosis not present

## 2013-03-01 DIAGNOSIS — R0609 Other forms of dyspnea: Secondary | ICD-10-CM | POA: Diagnosis not present

## 2013-03-01 DIAGNOSIS — K219 Gastro-esophageal reflux disease without esophagitis: Secondary | ICD-10-CM | POA: Diagnosis not present

## 2013-03-01 DIAGNOSIS — J984 Other disorders of lung: Secondary | ICD-10-CM | POA: Diagnosis not present

## 2013-03-01 DIAGNOSIS — I10 Essential (primary) hypertension: Secondary | ICD-10-CM | POA: Diagnosis not present

## 2013-03-05 DIAGNOSIS — J984 Other disorders of lung: Secondary | ICD-10-CM | POA: Diagnosis not present

## 2013-03-05 DIAGNOSIS — R262 Difficulty in walking, not elsewhere classified: Secondary | ICD-10-CM | POA: Diagnosis not present

## 2013-03-05 DIAGNOSIS — R0609 Other forms of dyspnea: Secondary | ICD-10-CM | POA: Diagnosis not present

## 2013-03-05 DIAGNOSIS — I10 Essential (primary) hypertension: Secondary | ICD-10-CM | POA: Diagnosis not present

## 2013-03-05 DIAGNOSIS — K219 Gastro-esophageal reflux disease without esophagitis: Secondary | ICD-10-CM | POA: Diagnosis not present

## 2013-03-05 DIAGNOSIS — N814 Uterovaginal prolapse, unspecified: Secondary | ICD-10-CM | POA: Diagnosis not present

## 2013-03-09 DIAGNOSIS — J984 Other disorders of lung: Secondary | ICD-10-CM | POA: Diagnosis not present

## 2013-03-09 DIAGNOSIS — R0609 Other forms of dyspnea: Secondary | ICD-10-CM | POA: Diagnosis not present

## 2013-03-09 DIAGNOSIS — I10 Essential (primary) hypertension: Secondary | ICD-10-CM | POA: Diagnosis not present

## 2013-03-09 DIAGNOSIS — N814 Uterovaginal prolapse, unspecified: Secondary | ICD-10-CM | POA: Diagnosis not present

## 2013-03-09 DIAGNOSIS — K219 Gastro-esophageal reflux disease without esophagitis: Secondary | ICD-10-CM | POA: Diagnosis not present

## 2013-03-09 DIAGNOSIS — R262 Difficulty in walking, not elsewhere classified: Secondary | ICD-10-CM | POA: Diagnosis not present

## 2013-03-23 DIAGNOSIS — M4800 Spinal stenosis, site unspecified: Secondary | ICD-10-CM | POA: Diagnosis not present

## 2013-03-23 DIAGNOSIS — K219 Gastro-esophageal reflux disease without esophagitis: Secondary | ICD-10-CM | POA: Diagnosis not present

## 2013-03-23 DIAGNOSIS — M6281 Muscle weakness (generalized): Secondary | ICD-10-CM | POA: Diagnosis not present

## 2013-03-23 DIAGNOSIS — I10 Essential (primary) hypertension: Secondary | ICD-10-CM | POA: Diagnosis not present

## 2013-03-23 DIAGNOSIS — N814 Uterovaginal prolapse, unspecified: Secondary | ICD-10-CM | POA: Diagnosis not present

## 2013-03-23 DIAGNOSIS — IMO0002 Reserved for concepts with insufficient information to code with codable children: Secondary | ICD-10-CM | POA: Diagnosis not present

## 2013-03-23 DIAGNOSIS — R262 Difficulty in walking, not elsewhere classified: Secondary | ICD-10-CM | POA: Diagnosis not present

## 2013-03-23 DIAGNOSIS — R279 Unspecified lack of coordination: Secondary | ICD-10-CM | POA: Diagnosis not present

## 2013-04-01 DIAGNOSIS — H02409 Unspecified ptosis of unspecified eyelid: Secondary | ICD-10-CM | POA: Diagnosis not present

## 2013-04-01 DIAGNOSIS — R42 Dizziness and giddiness: Secondary | ICD-10-CM | POA: Diagnosis not present

## 2013-04-06 ENCOUNTER — Ambulatory Visit: Payer: Medicare Other | Admitting: Nurse Practitioner

## 2013-04-06 ENCOUNTER — Telehealth: Payer: Self-pay | Admitting: Nurse Practitioner

## 2013-04-06 NOTE — Telephone Encounter (Signed)
No show for scheduled appt 

## 2013-04-13 ENCOUNTER — Ambulatory Visit: Payer: BLUE CROSS/BLUE SHIELD | Admitting: Nurse Practitioner

## 2013-04-19 DIAGNOSIS — J984 Other disorders of lung: Secondary | ICD-10-CM | POA: Diagnosis not present

## 2013-04-19 DIAGNOSIS — N814 Uterovaginal prolapse, unspecified: Secondary | ICD-10-CM | POA: Diagnosis not present

## 2013-04-19 DIAGNOSIS — R0609 Other forms of dyspnea: Secondary | ICD-10-CM | POA: Diagnosis not present

## 2013-04-19 DIAGNOSIS — R262 Difficulty in walking, not elsewhere classified: Secondary | ICD-10-CM | POA: Diagnosis not present

## 2013-04-19 DIAGNOSIS — IMO0002 Reserved for concepts with insufficient information to code with codable children: Secondary | ICD-10-CM | POA: Diagnosis not present

## 2013-04-19 DIAGNOSIS — I10 Essential (primary) hypertension: Secondary | ICD-10-CM | POA: Diagnosis not present

## 2013-04-19 DIAGNOSIS — K219 Gastro-esophageal reflux disease without esophagitis: Secondary | ICD-10-CM | POA: Diagnosis not present

## 2013-04-19 DIAGNOSIS — M4800 Spinal stenosis, site unspecified: Secondary | ICD-10-CM | POA: Diagnosis not present

## 2013-04-20 DIAGNOSIS — H6590 Unspecified nonsuppurative otitis media, unspecified ear: Secondary | ICD-10-CM | POA: Diagnosis not present

## 2013-04-20 DIAGNOSIS — R42 Dizziness and giddiness: Secondary | ICD-10-CM | POA: Diagnosis not present

## 2013-04-22 DIAGNOSIS — I10 Essential (primary) hypertension: Secondary | ICD-10-CM | POA: Diagnosis not present

## 2013-04-22 DIAGNOSIS — R262 Difficulty in walking, not elsewhere classified: Secondary | ICD-10-CM | POA: Diagnosis not present

## 2013-04-22 DIAGNOSIS — N814 Uterovaginal prolapse, unspecified: Secondary | ICD-10-CM | POA: Diagnosis not present

## 2013-04-22 DIAGNOSIS — K219 Gastro-esophageal reflux disease without esophagitis: Secondary | ICD-10-CM | POA: Diagnosis not present

## 2013-04-22 DIAGNOSIS — R0989 Other specified symptoms and signs involving the circulatory and respiratory systems: Secondary | ICD-10-CM | POA: Diagnosis not present

## 2013-04-22 DIAGNOSIS — J984 Other disorders of lung: Secondary | ICD-10-CM | POA: Diagnosis not present

## 2013-04-22 DIAGNOSIS — R0609 Other forms of dyspnea: Secondary | ICD-10-CM | POA: Diagnosis not present

## 2013-04-27 DIAGNOSIS — R262 Difficulty in walking, not elsewhere classified: Secondary | ICD-10-CM | POA: Diagnosis not present

## 2013-04-27 DIAGNOSIS — K219 Gastro-esophageal reflux disease without esophagitis: Secondary | ICD-10-CM | POA: Diagnosis not present

## 2013-04-27 DIAGNOSIS — I10 Essential (primary) hypertension: Secondary | ICD-10-CM | POA: Diagnosis not present

## 2013-04-27 DIAGNOSIS — J984 Other disorders of lung: Secondary | ICD-10-CM | POA: Diagnosis not present

## 2013-04-27 DIAGNOSIS — N814 Uterovaginal prolapse, unspecified: Secondary | ICD-10-CM | POA: Diagnosis not present

## 2013-04-27 DIAGNOSIS — R0609 Other forms of dyspnea: Secondary | ICD-10-CM | POA: Diagnosis not present

## 2013-05-01 ENCOUNTER — Other Ambulatory Visit: Payer: Self-pay | Admitting: Neurology

## 2013-05-02 NOTE — Telephone Encounter (Signed)
Patient cancelled last appt and no showed the previous appt scheduled

## 2013-05-07 DIAGNOSIS — K219 Gastro-esophageal reflux disease without esophagitis: Secondary | ICD-10-CM | POA: Diagnosis not present

## 2013-05-07 DIAGNOSIS — R262 Difficulty in walking, not elsewhere classified: Secondary | ICD-10-CM | POA: Diagnosis not present

## 2013-05-07 DIAGNOSIS — N814 Uterovaginal prolapse, unspecified: Secondary | ICD-10-CM | POA: Diagnosis not present

## 2013-05-07 DIAGNOSIS — I10 Essential (primary) hypertension: Secondary | ICD-10-CM | POA: Diagnosis not present

## 2013-05-07 DIAGNOSIS — J984 Other disorders of lung: Secondary | ICD-10-CM | POA: Diagnosis not present

## 2013-05-07 DIAGNOSIS — R0609 Other forms of dyspnea: Secondary | ICD-10-CM | POA: Diagnosis not present

## 2013-05-11 DIAGNOSIS — K219 Gastro-esophageal reflux disease without esophagitis: Secondary | ICD-10-CM | POA: Diagnosis not present

## 2013-05-11 DIAGNOSIS — N814 Uterovaginal prolapse, unspecified: Secondary | ICD-10-CM | POA: Diagnosis not present

## 2013-05-11 DIAGNOSIS — R0609 Other forms of dyspnea: Secondary | ICD-10-CM | POA: Diagnosis not present

## 2013-05-11 DIAGNOSIS — I10 Essential (primary) hypertension: Secondary | ICD-10-CM | POA: Diagnosis not present

## 2013-05-11 DIAGNOSIS — R262 Difficulty in walking, not elsewhere classified: Secondary | ICD-10-CM | POA: Diagnosis not present

## 2013-05-11 DIAGNOSIS — J984 Other disorders of lung: Secondary | ICD-10-CM | POA: Diagnosis not present

## 2013-05-13 DIAGNOSIS — K219 Gastro-esophageal reflux disease without esophagitis: Secondary | ICD-10-CM | POA: Diagnosis not present

## 2013-05-13 DIAGNOSIS — I10 Essential (primary) hypertension: Secondary | ICD-10-CM | POA: Diagnosis not present

## 2013-05-13 DIAGNOSIS — J984 Other disorders of lung: Secondary | ICD-10-CM | POA: Diagnosis not present

## 2013-05-13 DIAGNOSIS — R0609 Other forms of dyspnea: Secondary | ICD-10-CM | POA: Diagnosis not present

## 2013-05-13 DIAGNOSIS — R0989 Other specified symptoms and signs involving the circulatory and respiratory systems: Secondary | ICD-10-CM | POA: Diagnosis not present

## 2013-05-13 DIAGNOSIS — N814 Uterovaginal prolapse, unspecified: Secondary | ICD-10-CM | POA: Diagnosis not present

## 2013-05-13 DIAGNOSIS — R262 Difficulty in walking, not elsewhere classified: Secondary | ICD-10-CM | POA: Diagnosis not present

## 2013-05-17 ENCOUNTER — Encounter: Payer: Self-pay | Admitting: General Surgery

## 2013-05-18 DIAGNOSIS — I10 Essential (primary) hypertension: Secondary | ICD-10-CM | POA: Diagnosis not present

## 2013-05-18 DIAGNOSIS — IMO0002 Reserved for concepts with insufficient information to code with codable children: Secondary | ICD-10-CM | POA: Diagnosis not present

## 2013-05-18 DIAGNOSIS — R0609 Other forms of dyspnea: Secondary | ICD-10-CM | POA: Diagnosis not present

## 2013-05-18 DIAGNOSIS — M4800 Spinal stenosis, site unspecified: Secondary | ICD-10-CM | POA: Diagnosis not present

## 2013-05-18 DIAGNOSIS — R262 Difficulty in walking, not elsewhere classified: Secondary | ICD-10-CM | POA: Diagnosis not present

## 2013-05-18 DIAGNOSIS — N814 Uterovaginal prolapse, unspecified: Secondary | ICD-10-CM | POA: Diagnosis not present

## 2013-05-18 DIAGNOSIS — R0989 Other specified symptoms and signs involving the circulatory and respiratory systems: Secondary | ICD-10-CM | POA: Diagnosis not present

## 2013-05-18 DIAGNOSIS — K219 Gastro-esophageal reflux disease without esophagitis: Secondary | ICD-10-CM | POA: Diagnosis not present

## 2013-05-18 DIAGNOSIS — J984 Other disorders of lung: Secondary | ICD-10-CM | POA: Diagnosis not present

## 2013-05-20 DIAGNOSIS — K219 Gastro-esophageal reflux disease without esophagitis: Secondary | ICD-10-CM | POA: Diagnosis not present

## 2013-05-20 DIAGNOSIS — R262 Difficulty in walking, not elsewhere classified: Secondary | ICD-10-CM | POA: Diagnosis not present

## 2013-05-20 DIAGNOSIS — R0609 Other forms of dyspnea: Secondary | ICD-10-CM | POA: Diagnosis not present

## 2013-05-20 DIAGNOSIS — J984 Other disorders of lung: Secondary | ICD-10-CM | POA: Diagnosis not present

## 2013-05-20 DIAGNOSIS — I10 Essential (primary) hypertension: Secondary | ICD-10-CM | POA: Diagnosis not present

## 2013-05-20 DIAGNOSIS — N814 Uterovaginal prolapse, unspecified: Secondary | ICD-10-CM | POA: Diagnosis not present

## 2013-07-30 DIAGNOSIS — Z961 Presence of intraocular lens: Secondary | ICD-10-CM | POA: Diagnosis not present

## 2013-07-30 DIAGNOSIS — H52209 Unspecified astigmatism, unspecified eye: Secondary | ICD-10-CM | POA: Diagnosis not present

## 2013-08-02 DIAGNOSIS — W19XXXA Unspecified fall, initial encounter: Secondary | ICD-10-CM | POA: Diagnosis not present

## 2013-08-02 DIAGNOSIS — M546 Pain in thoracic spine: Secondary | ICD-10-CM | POA: Diagnosis not present

## 2013-08-03 ENCOUNTER — Ambulatory Visit
Admission: RE | Admit: 2013-08-03 | Discharge: 2013-08-03 | Disposition: A | Discharge status: Home / Self Care | Payer: Medicare Other | Source: Ambulatory Visit | Attending: Family Medicine | Admitting: Family Medicine

## 2013-08-03 ENCOUNTER — Other Ambulatory Visit: Payer: Self-pay | Admitting: Family Medicine

## 2013-08-03 DIAGNOSIS — W19XXXA Unspecified fall, initial encounter: Secondary | ICD-10-CM

## 2013-08-03 DIAGNOSIS — R52 Pain, unspecified: Secondary | ICD-10-CM

## 2013-08-03 DIAGNOSIS — IMO0002 Reserved for concepts with insufficient information to code with codable children: Secondary | ICD-10-CM | POA: Diagnosis not present

## 2013-08-11 DIAGNOSIS — M1990 Unspecified osteoarthritis, unspecified site: Secondary | ICD-10-CM | POA: Diagnosis not present

## 2013-08-11 DIAGNOSIS — M2550 Pain in unspecified joint: Secondary | ICD-10-CM | POA: Diagnosis not present

## 2013-08-11 DIAGNOSIS — M064 Inflammatory polyarthropathy: Secondary | ICD-10-CM | POA: Diagnosis not present

## 2013-09-07 DIAGNOSIS — Z9181 History of falling: Secondary | ICD-10-CM | POA: Diagnosis not present

## 2013-09-07 DIAGNOSIS — S0990XA Unspecified injury of head, initial encounter: Secondary | ICD-10-CM | POA: Diagnosis not present

## 2013-09-07 DIAGNOSIS — R42 Dizziness and giddiness: Secondary | ICD-10-CM | POA: Diagnosis not present

## 2013-09-08 ENCOUNTER — Ambulatory Visit
Admission: RE | Admit: 2013-09-08 | Discharge: 2013-09-08 | Disposition: A | Discharge status: Home / Self Care | Payer: Medicare Other | Source: Ambulatory Visit | Attending: Family Medicine | Admitting: Family Medicine

## 2013-09-08 ENCOUNTER — Other Ambulatory Visit: Payer: Self-pay | Admitting: Family Medicine

## 2013-09-08 DIAGNOSIS — R42 Dizziness and giddiness: Secondary | ICD-10-CM | POA: Diagnosis not present

## 2013-09-08 DIAGNOSIS — R51 Headache: Secondary | ICD-10-CM | POA: Diagnosis not present

## 2013-09-13 DIAGNOSIS — R0609 Other forms of dyspnea: Secondary | ICD-10-CM | POA: Diagnosis not present

## 2013-09-13 DIAGNOSIS — I10 Essential (primary) hypertension: Secondary | ICD-10-CM | POA: Diagnosis not present

## 2013-09-13 DIAGNOSIS — M4800 Spinal stenosis, site unspecified: Secondary | ICD-10-CM | POA: Diagnosis not present

## 2013-09-13 DIAGNOSIS — K219 Gastro-esophageal reflux disease without esophagitis: Secondary | ICD-10-CM | POA: Diagnosis not present

## 2013-09-13 DIAGNOSIS — R262 Difficulty in walking, not elsewhere classified: Secondary | ICD-10-CM | POA: Diagnosis not present

## 2013-09-13 DIAGNOSIS — N814 Uterovaginal prolapse, unspecified: Secondary | ICD-10-CM | POA: Diagnosis not present

## 2013-09-13 DIAGNOSIS — IMO0002 Reserved for concepts with insufficient information to code with codable children: Secondary | ICD-10-CM | POA: Diagnosis not present

## 2013-09-13 DIAGNOSIS — R0989 Other specified symptoms and signs involving the circulatory and respiratory systems: Secondary | ICD-10-CM | POA: Diagnosis not present

## 2013-09-13 DIAGNOSIS — J984 Other disorders of lung: Secondary | ICD-10-CM | POA: Diagnosis not present

## 2013-09-15 DIAGNOSIS — Z9181 History of falling: Secondary | ICD-10-CM | POA: Diagnosis not present

## 2013-09-15 DIAGNOSIS — N814 Uterovaginal prolapse, unspecified: Secondary | ICD-10-CM | POA: Diagnosis not present

## 2013-09-15 DIAGNOSIS — K219 Gastro-esophageal reflux disease without esophagitis: Secondary | ICD-10-CM | POA: Diagnosis not present

## 2013-09-15 DIAGNOSIS — M4800 Spinal stenosis, site unspecified: Secondary | ICD-10-CM | POA: Diagnosis not present

## 2013-09-15 DIAGNOSIS — M6281 Muscle weakness (generalized): Secondary | ICD-10-CM | POA: Diagnosis not present

## 2013-09-15 DIAGNOSIS — IMO0002 Reserved for concepts with insufficient information to code with codable children: Secondary | ICD-10-CM | POA: Diagnosis not present

## 2013-09-15 DIAGNOSIS — I10 Essential (primary) hypertension: Secondary | ICD-10-CM | POA: Diagnosis not present

## 2013-09-15 DIAGNOSIS — R262 Difficulty in walking, not elsewhere classified: Secondary | ICD-10-CM | POA: Diagnosis not present

## 2013-09-29 DIAGNOSIS — M6281 Muscle weakness (generalized): Secondary | ICD-10-CM | POA: Diagnosis not present

## 2013-09-29 DIAGNOSIS — I10 Essential (primary) hypertension: Secondary | ICD-10-CM | POA: Diagnosis not present

## 2013-09-29 DIAGNOSIS — Z9181 History of falling: Secondary | ICD-10-CM | POA: Diagnosis not present

## 2013-09-29 DIAGNOSIS — N814 Uterovaginal prolapse, unspecified: Secondary | ICD-10-CM | POA: Diagnosis not present

## 2013-09-29 DIAGNOSIS — R262 Difficulty in walking, not elsewhere classified: Secondary | ICD-10-CM | POA: Diagnosis not present

## 2013-09-29 DIAGNOSIS — K219 Gastro-esophageal reflux disease without esophagitis: Secondary | ICD-10-CM | POA: Diagnosis not present

## 2013-10-04 DIAGNOSIS — N814 Uterovaginal prolapse, unspecified: Secondary | ICD-10-CM | POA: Diagnosis not present

## 2013-10-04 DIAGNOSIS — R262 Difficulty in walking, not elsewhere classified: Secondary | ICD-10-CM | POA: Diagnosis not present

## 2013-10-04 DIAGNOSIS — Z9181 History of falling: Secondary | ICD-10-CM | POA: Diagnosis not present

## 2013-10-04 DIAGNOSIS — I10 Essential (primary) hypertension: Secondary | ICD-10-CM | POA: Diagnosis not present

## 2013-10-04 DIAGNOSIS — K219 Gastro-esophageal reflux disease without esophagitis: Secondary | ICD-10-CM | POA: Diagnosis not present

## 2013-10-04 DIAGNOSIS — M6281 Muscle weakness (generalized): Secondary | ICD-10-CM | POA: Diagnosis not present

## 2013-10-05 DIAGNOSIS — I10 Essential (primary) hypertension: Secondary | ICD-10-CM | POA: Diagnosis not present

## 2013-10-05 DIAGNOSIS — F411 Generalized anxiety disorder: Secondary | ICD-10-CM | POA: Diagnosis not present

## 2013-10-05 DIAGNOSIS — Z9181 History of falling: Secondary | ICD-10-CM | POA: Diagnosis not present

## 2013-10-05 DIAGNOSIS — M2550 Pain in unspecified joint: Secondary | ICD-10-CM | POA: Diagnosis not present

## 2013-10-05 DIAGNOSIS — F329 Major depressive disorder, single episode, unspecified: Secondary | ICD-10-CM | POA: Diagnosis not present

## 2013-10-05 DIAGNOSIS — F3289 Other specified depressive episodes: Secondary | ICD-10-CM | POA: Diagnosis not present

## 2013-10-06 DIAGNOSIS — I10 Essential (primary) hypertension: Secondary | ICD-10-CM | POA: Diagnosis not present

## 2013-10-06 DIAGNOSIS — Z9181 History of falling: Secondary | ICD-10-CM | POA: Diagnosis not present

## 2013-10-06 DIAGNOSIS — N814 Uterovaginal prolapse, unspecified: Secondary | ICD-10-CM | POA: Diagnosis not present

## 2013-10-06 DIAGNOSIS — M6281 Muscle weakness (generalized): Secondary | ICD-10-CM | POA: Diagnosis not present

## 2013-10-06 DIAGNOSIS — R262 Difficulty in walking, not elsewhere classified: Secondary | ICD-10-CM | POA: Diagnosis not present

## 2013-10-06 DIAGNOSIS — K219 Gastro-esophageal reflux disease without esophagitis: Secondary | ICD-10-CM | POA: Diagnosis not present

## 2013-10-11 DIAGNOSIS — M6281 Muscle weakness (generalized): Secondary | ICD-10-CM | POA: Diagnosis not present

## 2013-10-11 DIAGNOSIS — N814 Uterovaginal prolapse, unspecified: Secondary | ICD-10-CM | POA: Diagnosis not present

## 2013-10-11 DIAGNOSIS — K219 Gastro-esophageal reflux disease without esophagitis: Secondary | ICD-10-CM | POA: Diagnosis not present

## 2013-10-11 DIAGNOSIS — Z9181 History of falling: Secondary | ICD-10-CM | POA: Diagnosis not present

## 2013-10-11 DIAGNOSIS — R262 Difficulty in walking, not elsewhere classified: Secondary | ICD-10-CM | POA: Diagnosis not present

## 2013-10-11 DIAGNOSIS — I10 Essential (primary) hypertension: Secondary | ICD-10-CM | POA: Diagnosis not present

## 2013-10-13 DIAGNOSIS — R262 Difficulty in walking, not elsewhere classified: Secondary | ICD-10-CM | POA: Diagnosis not present

## 2013-10-13 DIAGNOSIS — I10 Essential (primary) hypertension: Secondary | ICD-10-CM | POA: Diagnosis not present

## 2013-10-13 DIAGNOSIS — M6281 Muscle weakness (generalized): Secondary | ICD-10-CM | POA: Diagnosis not present

## 2013-10-13 DIAGNOSIS — N814 Uterovaginal prolapse, unspecified: Secondary | ICD-10-CM | POA: Diagnosis not present

## 2013-10-13 DIAGNOSIS — K219 Gastro-esophageal reflux disease without esophagitis: Secondary | ICD-10-CM | POA: Diagnosis not present

## 2013-10-13 DIAGNOSIS — Z9181 History of falling: Secondary | ICD-10-CM | POA: Diagnosis not present

## 2013-10-15 DIAGNOSIS — N814 Uterovaginal prolapse, unspecified: Secondary | ICD-10-CM | POA: Diagnosis not present

## 2013-10-15 DIAGNOSIS — R262 Difficulty in walking, not elsewhere classified: Secondary | ICD-10-CM | POA: Diagnosis not present

## 2013-10-15 DIAGNOSIS — M6281 Muscle weakness (generalized): Secondary | ICD-10-CM | POA: Diagnosis not present

## 2013-10-15 DIAGNOSIS — I10 Essential (primary) hypertension: Secondary | ICD-10-CM | POA: Diagnosis not present

## 2013-10-15 DIAGNOSIS — Z9181 History of falling: Secondary | ICD-10-CM | POA: Diagnosis not present

## 2013-10-15 DIAGNOSIS — K219 Gastro-esophageal reflux disease without esophagitis: Secondary | ICD-10-CM | POA: Diagnosis not present

## 2013-10-18 DIAGNOSIS — R262 Difficulty in walking, not elsewhere classified: Secondary | ICD-10-CM | POA: Diagnosis not present

## 2013-10-18 DIAGNOSIS — N814 Uterovaginal prolapse, unspecified: Secondary | ICD-10-CM | POA: Diagnosis not present

## 2013-10-18 DIAGNOSIS — R0989 Other specified symptoms and signs involving the circulatory and respiratory systems: Secondary | ICD-10-CM | POA: Diagnosis not present

## 2013-10-18 DIAGNOSIS — K219 Gastro-esophageal reflux disease without esophagitis: Secondary | ICD-10-CM | POA: Diagnosis not present

## 2013-10-18 DIAGNOSIS — IMO0002 Reserved for concepts with insufficient information to code with codable children: Secondary | ICD-10-CM | POA: Diagnosis not present

## 2013-10-18 DIAGNOSIS — M4800 Spinal stenosis, site unspecified: Secondary | ICD-10-CM | POA: Diagnosis not present

## 2013-10-18 DIAGNOSIS — J984 Other disorders of lung: Secondary | ICD-10-CM | POA: Diagnosis not present

## 2013-10-18 DIAGNOSIS — I10 Essential (primary) hypertension: Secondary | ICD-10-CM | POA: Diagnosis not present

## 2013-10-18 DIAGNOSIS — R0609 Other forms of dyspnea: Secondary | ICD-10-CM | POA: Diagnosis not present

## 2013-10-20 DIAGNOSIS — J984 Other disorders of lung: Secondary | ICD-10-CM | POA: Diagnosis not present

## 2013-10-20 DIAGNOSIS — R262 Difficulty in walking, not elsewhere classified: Secondary | ICD-10-CM | POA: Diagnosis not present

## 2013-10-20 DIAGNOSIS — N814 Uterovaginal prolapse, unspecified: Secondary | ICD-10-CM | POA: Diagnosis not present

## 2013-10-20 DIAGNOSIS — R0609 Other forms of dyspnea: Secondary | ICD-10-CM | POA: Diagnosis not present

## 2013-10-20 DIAGNOSIS — K219 Gastro-esophageal reflux disease without esophagitis: Secondary | ICD-10-CM | POA: Diagnosis not present

## 2013-10-20 DIAGNOSIS — I10 Essential (primary) hypertension: Secondary | ICD-10-CM | POA: Diagnosis not present

## 2013-10-22 DIAGNOSIS — J984 Other disorders of lung: Secondary | ICD-10-CM | POA: Diagnosis not present

## 2013-10-22 DIAGNOSIS — K219 Gastro-esophageal reflux disease without esophagitis: Secondary | ICD-10-CM | POA: Diagnosis not present

## 2013-10-22 DIAGNOSIS — R0609 Other forms of dyspnea: Secondary | ICD-10-CM | POA: Diagnosis not present

## 2013-10-22 DIAGNOSIS — N814 Uterovaginal prolapse, unspecified: Secondary | ICD-10-CM | POA: Diagnosis not present

## 2013-10-22 DIAGNOSIS — I10 Essential (primary) hypertension: Secondary | ICD-10-CM | POA: Diagnosis not present

## 2013-10-22 DIAGNOSIS — R262 Difficulty in walking, not elsewhere classified: Secondary | ICD-10-CM | POA: Diagnosis not present

## 2013-10-25 DIAGNOSIS — J984 Other disorders of lung: Secondary | ICD-10-CM | POA: Diagnosis not present

## 2013-10-25 DIAGNOSIS — R0609 Other forms of dyspnea: Secondary | ICD-10-CM | POA: Diagnosis not present

## 2013-10-25 DIAGNOSIS — N814 Uterovaginal prolapse, unspecified: Secondary | ICD-10-CM | POA: Diagnosis not present

## 2013-10-25 DIAGNOSIS — R0989 Other specified symptoms and signs involving the circulatory and respiratory systems: Secondary | ICD-10-CM | POA: Diagnosis not present

## 2013-10-25 DIAGNOSIS — K219 Gastro-esophageal reflux disease without esophagitis: Secondary | ICD-10-CM | POA: Diagnosis not present

## 2013-10-25 DIAGNOSIS — R262 Difficulty in walking, not elsewhere classified: Secondary | ICD-10-CM | POA: Diagnosis not present

## 2013-10-25 DIAGNOSIS — I10 Essential (primary) hypertension: Secondary | ICD-10-CM | POA: Diagnosis not present

## 2013-10-27 DIAGNOSIS — R0989 Other specified symptoms and signs involving the circulatory and respiratory systems: Secondary | ICD-10-CM | POA: Diagnosis not present

## 2013-10-27 DIAGNOSIS — N814 Uterovaginal prolapse, unspecified: Secondary | ICD-10-CM | POA: Diagnosis not present

## 2013-10-27 DIAGNOSIS — R0609 Other forms of dyspnea: Secondary | ICD-10-CM | POA: Diagnosis not present

## 2013-10-27 DIAGNOSIS — K219 Gastro-esophageal reflux disease without esophagitis: Secondary | ICD-10-CM | POA: Diagnosis not present

## 2013-10-27 DIAGNOSIS — R262 Difficulty in walking, not elsewhere classified: Secondary | ICD-10-CM | POA: Diagnosis not present

## 2013-10-27 DIAGNOSIS — J984 Other disorders of lung: Secondary | ICD-10-CM | POA: Diagnosis not present

## 2013-10-27 DIAGNOSIS — I10 Essential (primary) hypertension: Secondary | ICD-10-CM | POA: Diagnosis not present

## 2013-10-29 DIAGNOSIS — R262 Difficulty in walking, not elsewhere classified: Secondary | ICD-10-CM | POA: Diagnosis not present

## 2013-10-29 DIAGNOSIS — J984 Other disorders of lung: Secondary | ICD-10-CM | POA: Diagnosis not present

## 2013-10-29 DIAGNOSIS — R0609 Other forms of dyspnea: Secondary | ICD-10-CM | POA: Diagnosis not present

## 2013-10-29 DIAGNOSIS — N814 Uterovaginal prolapse, unspecified: Secondary | ICD-10-CM | POA: Diagnosis not present

## 2013-10-29 DIAGNOSIS — I10 Essential (primary) hypertension: Secondary | ICD-10-CM | POA: Diagnosis not present

## 2013-10-29 DIAGNOSIS — K219 Gastro-esophageal reflux disease without esophagitis: Secondary | ICD-10-CM | POA: Diagnosis not present

## 2013-11-02 DIAGNOSIS — R0609 Other forms of dyspnea: Secondary | ICD-10-CM | POA: Diagnosis not present

## 2013-11-02 DIAGNOSIS — J984 Other disorders of lung: Secondary | ICD-10-CM | POA: Diagnosis not present

## 2013-11-02 DIAGNOSIS — N814 Uterovaginal prolapse, unspecified: Secondary | ICD-10-CM | POA: Diagnosis not present

## 2013-11-02 DIAGNOSIS — K219 Gastro-esophageal reflux disease without esophagitis: Secondary | ICD-10-CM | POA: Diagnosis not present

## 2013-11-02 DIAGNOSIS — I10 Essential (primary) hypertension: Secondary | ICD-10-CM | POA: Diagnosis not present

## 2013-11-02 DIAGNOSIS — R262 Difficulty in walking, not elsewhere classified: Secondary | ICD-10-CM | POA: Diagnosis not present

## 2013-11-02 DIAGNOSIS — R0989 Other specified symptoms and signs involving the circulatory and respiratory systems: Secondary | ICD-10-CM | POA: Diagnosis not present

## 2013-11-03 DIAGNOSIS — R0989 Other specified symptoms and signs involving the circulatory and respiratory systems: Secondary | ICD-10-CM | POA: Diagnosis not present

## 2013-11-03 DIAGNOSIS — R262 Difficulty in walking, not elsewhere classified: Secondary | ICD-10-CM | POA: Diagnosis not present

## 2013-11-03 DIAGNOSIS — N814 Uterovaginal prolapse, unspecified: Secondary | ICD-10-CM | POA: Diagnosis not present

## 2013-11-03 DIAGNOSIS — R0609 Other forms of dyspnea: Secondary | ICD-10-CM | POA: Diagnosis not present

## 2013-11-03 DIAGNOSIS — J984 Other disorders of lung: Secondary | ICD-10-CM | POA: Diagnosis not present

## 2013-11-03 DIAGNOSIS — I10 Essential (primary) hypertension: Secondary | ICD-10-CM | POA: Diagnosis not present

## 2013-11-03 DIAGNOSIS — K219 Gastro-esophageal reflux disease without esophagitis: Secondary | ICD-10-CM | POA: Diagnosis not present

## 2013-11-05 DIAGNOSIS — R262 Difficulty in walking, not elsewhere classified: Secondary | ICD-10-CM | POA: Diagnosis not present

## 2013-11-05 DIAGNOSIS — J984 Other disorders of lung: Secondary | ICD-10-CM | POA: Diagnosis not present

## 2013-11-05 DIAGNOSIS — I10 Essential (primary) hypertension: Secondary | ICD-10-CM | POA: Diagnosis not present

## 2013-11-05 DIAGNOSIS — R0609 Other forms of dyspnea: Secondary | ICD-10-CM | POA: Diagnosis not present

## 2013-11-05 DIAGNOSIS — K219 Gastro-esophageal reflux disease without esophagitis: Secondary | ICD-10-CM | POA: Diagnosis not present

## 2013-11-05 DIAGNOSIS — N814 Uterovaginal prolapse, unspecified: Secondary | ICD-10-CM | POA: Diagnosis not present

## 2013-11-08 DIAGNOSIS — R262 Difficulty in walking, not elsewhere classified: Secondary | ICD-10-CM | POA: Diagnosis not present

## 2013-11-08 DIAGNOSIS — J984 Other disorders of lung: Secondary | ICD-10-CM | POA: Diagnosis not present

## 2013-11-08 DIAGNOSIS — N814 Uterovaginal prolapse, unspecified: Secondary | ICD-10-CM | POA: Diagnosis not present

## 2013-11-08 DIAGNOSIS — I10 Essential (primary) hypertension: Secondary | ICD-10-CM | POA: Diagnosis not present

## 2013-11-08 DIAGNOSIS — K219 Gastro-esophageal reflux disease without esophagitis: Secondary | ICD-10-CM | POA: Diagnosis not present

## 2013-11-08 DIAGNOSIS — R0609 Other forms of dyspnea: Secondary | ICD-10-CM | POA: Diagnosis not present

## 2013-11-08 DIAGNOSIS — R0989 Other specified symptoms and signs involving the circulatory and respiratory systems: Secondary | ICD-10-CM | POA: Diagnosis not present

## 2013-11-10 DIAGNOSIS — K219 Gastro-esophageal reflux disease without esophagitis: Secondary | ICD-10-CM | POA: Diagnosis not present

## 2013-11-10 DIAGNOSIS — N814 Uterovaginal prolapse, unspecified: Secondary | ICD-10-CM | POA: Diagnosis not present

## 2013-11-10 DIAGNOSIS — R0989 Other specified symptoms and signs involving the circulatory and respiratory systems: Secondary | ICD-10-CM | POA: Diagnosis not present

## 2013-11-10 DIAGNOSIS — I10 Essential (primary) hypertension: Secondary | ICD-10-CM | POA: Diagnosis not present

## 2013-11-10 DIAGNOSIS — R0609 Other forms of dyspnea: Secondary | ICD-10-CM | POA: Diagnosis not present

## 2013-11-10 DIAGNOSIS — J984 Other disorders of lung: Secondary | ICD-10-CM | POA: Diagnosis not present

## 2013-11-10 DIAGNOSIS — R262 Difficulty in walking, not elsewhere classified: Secondary | ICD-10-CM | POA: Diagnosis not present

## 2013-12-01 DIAGNOSIS — Z9181 History of falling: Secondary | ICD-10-CM | POA: Diagnosis not present

## 2013-12-01 DIAGNOSIS — R51 Headache: Secondary | ICD-10-CM | POA: Diagnosis not present

## 2013-12-01 DIAGNOSIS — F411 Generalized anxiety disorder: Secondary | ICD-10-CM | POA: Diagnosis not present

## 2013-12-03 ENCOUNTER — Institutional Professional Consult (permissible substitution): Payer: BLUE CROSS/BLUE SHIELD | Admitting: Neurology

## 2013-12-03 ENCOUNTER — Telehealth: Payer: Self-pay | Admitting: Neurology

## 2013-12-03 NOTE — Telephone Encounter (Signed)
This patient did not show for an appointment today, apparently got lost, called after her appointment time.

## 2013-12-09 DIAGNOSIS — K219 Gastro-esophageal reflux disease without esophagitis: Secondary | ICD-10-CM | POA: Diagnosis not present

## 2013-12-09 DIAGNOSIS — M4800 Spinal stenosis, site unspecified: Secondary | ICD-10-CM | POA: Diagnosis not present

## 2013-12-09 DIAGNOSIS — J984 Other disorders of lung: Secondary | ICD-10-CM | POA: Diagnosis not present

## 2013-12-09 DIAGNOSIS — R0609 Other forms of dyspnea: Secondary | ICD-10-CM | POA: Diagnosis not present

## 2013-12-09 DIAGNOSIS — I10 Essential (primary) hypertension: Secondary | ICD-10-CM | POA: Diagnosis not present

## 2013-12-09 DIAGNOSIS — IMO0002 Reserved for concepts with insufficient information to code with codable children: Secondary | ICD-10-CM | POA: Diagnosis not present

## 2013-12-09 DIAGNOSIS — R262 Difficulty in walking, not elsewhere classified: Secondary | ICD-10-CM | POA: Diagnosis not present

## 2013-12-09 DIAGNOSIS — N814 Uterovaginal prolapse, unspecified: Secondary | ICD-10-CM | POA: Diagnosis not present

## 2013-12-14 DIAGNOSIS — K219 Gastro-esophageal reflux disease without esophagitis: Secondary | ICD-10-CM | POA: Diagnosis not present

## 2013-12-14 DIAGNOSIS — R262 Difficulty in walking, not elsewhere classified: Secondary | ICD-10-CM | POA: Diagnosis not present

## 2013-12-14 DIAGNOSIS — I10 Essential (primary) hypertension: Secondary | ICD-10-CM | POA: Diagnosis not present

## 2013-12-14 DIAGNOSIS — N814 Uterovaginal prolapse, unspecified: Secondary | ICD-10-CM | POA: Diagnosis not present

## 2013-12-14 DIAGNOSIS — J984 Other disorders of lung: Secondary | ICD-10-CM | POA: Diagnosis not present

## 2013-12-14 DIAGNOSIS — R0609 Other forms of dyspnea: Secondary | ICD-10-CM | POA: Diagnosis not present

## 2013-12-16 DIAGNOSIS — R41841 Cognitive communication deficit: Secondary | ICD-10-CM | POA: Diagnosis not present

## 2013-12-20 DIAGNOSIS — R41841 Cognitive communication deficit: Secondary | ICD-10-CM | POA: Diagnosis not present

## 2013-12-21 ENCOUNTER — Ambulatory Visit (INDEPENDENT_AMBULATORY_CARE_PROVIDER_SITE_OTHER): Payer: Medicare Other | Admitting: Neurology

## 2013-12-21 ENCOUNTER — Encounter: Payer: Self-pay | Admitting: Neurology

## 2013-12-21 ENCOUNTER — Encounter (INDEPENDENT_AMBULATORY_CARE_PROVIDER_SITE_OTHER): Payer: Self-pay

## 2013-12-21 VITALS — BP 141/81 | HR 106 | Ht 62.0 in | Wt 117.0 lb

## 2013-12-21 DIAGNOSIS — E538 Deficiency of other specified B group vitamins: Secondary | ICD-10-CM

## 2013-12-21 DIAGNOSIS — R5381 Other malaise: Secondary | ICD-10-CM | POA: Diagnosis not present

## 2013-12-21 DIAGNOSIS — R269 Unspecified abnormalities of gait and mobility: Secondary | ICD-10-CM

## 2013-12-21 DIAGNOSIS — G252 Other specified forms of tremor: Principal | ICD-10-CM

## 2013-12-21 DIAGNOSIS — R251 Tremor, unspecified: Secondary | ICD-10-CM

## 2013-12-21 DIAGNOSIS — M21372 Foot drop, left foot: Secondary | ICD-10-CM

## 2013-12-21 DIAGNOSIS — R5383 Other fatigue: Secondary | ICD-10-CM | POA: Diagnosis not present

## 2013-12-21 DIAGNOSIS — M21371 Foot drop, right foot: Secondary | ICD-10-CM

## 2013-12-21 DIAGNOSIS — G250 Essential tremor: Secondary | ICD-10-CM

## 2013-12-21 HISTORY — DX: Foot drop, right foot: M21.371

## 2013-12-21 NOTE — Patient Instructions (Signed)

## 2013-12-21 NOTE — Progress Notes (Signed)
Reason for visit: Gait disorder  [name] is a 80 y.o. female  History of present illness:  [name] is an 80 year old right-handed white female with a history of an essential tremor and a gait disorder. The patient has had issues with her balance for number of years, but she indicates that this problem has gradually worsened over time. The patient has fallen on 2 occasions, one in December of 2014, and then again in June of 2015. CT scan evaluations of the brain showed no acute changes. The patient has not report any numbness of the hands or feet. The patient uses a cane for ambulation, but she has fallen even while using a cane. The patient indicates that she will have a tendency to fall backwards. The patient struck her head on the last fall, but she did not lose consciousness. The patient denies any blackouts with the falls. She has had some dizziness since the last fall in June of 2015. She denies any neck pain or low back pain or pain down arms or legs. She currently is in physical therapy for her balance. She is sent to this office for an evaluation.  Past Medical History  Diagnosis Date  . Pain in joint, upper arm   . Disturbance of skin sensation   . Other abnormal clinical finding   . Disturbance of skin sensation   . Unspecified vitamin D deficiency   . Tobacco use disorder   . Chronic airway obstruction, not elsewhere classified   . Hypertension   . GERD (gastroesophageal reflux disease)   . Anemia     iron deficient  . Chronic back pain   . Chronic cystitis   . Hernia   . Depression   . Anxiety   . Essential and other specified forms of tremor 12/11/2012  . Osteoporosis   . Degenerative arthritis   . History of cerebrovascular disease     Moderate level small vessel disease  . Gait disorder   . Dyslipidemia   . Osteoarthritis     right knee injection per Dr Trudie Reed  . Hyperlipidemia     mild  . Peripheral vascular disease   . Hemorrhoids   . Esophageal  reflux   . Dyspnea     Dr Chase Caller  . Rheumatoid arthritis     Dr Trudie Reed  . Cystitis, chronic     Dr Gaynelle Arabian  . Tremor     and gait disorder--Dr Jannifer Franklin  . Foot drop, bilateral 12/21/2013    Past Surgical History  Procedure Laterality Date  . Back surgery Bilateral     X2  . Appendectomy    . Cataract extraction Bilateral   . Bladder resuspension procedure    . Tonsillectomy    . Inguinal hernia repair Bilateral     Family History  Problem Relation Age of Onset  . Heart Problems Mother   . Diabetes Father   . Heart attack Father   . Cancer Sister     Brain tumor  . Emphysema Brother     Social history:  reports that she has never smoked. She has never used smokeless tobacco. She reports that she does not drink alcohol or use illicit drugs.  Medications:  Current Outpatient Prescriptions on File Prior to Visit  Medication Sig Dispense Refill  . acetaminophen (TYLENOL) 650 MG CR tablet Take 650 mg by mouth 2 (two) times daily.      . Calcium Carbonate-Vitamin D (CALTRATE 600+D) 600-400 MG-UNIT per tablet  Take 1 tablet by mouth daily.      . cholecalciferol (VITAMIN D) 1000 UNITS tablet Take 1,000 Units by mouth daily.      . citalopram (CELEXA) 10 MG tablet Take 10 mg by mouth daily.       . diclofenac sodium (VOLTAREN) 1 % GEL Apply 4 g topically 4 (four) times daily.      . ferrous sulfate 325 (65 FE) MG tablet Take 325 mg by mouth daily with breakfast.      . fish oil-omega-3 fatty acids 1000 MG capsule Take 2 g by mouth daily.      . hydrochlorothiazide (MICROZIDE) 12.5 MG capsule Take 12.5 mg by mouth daily.      Marland Kitchen HYDROcodone-acetaminophen (NORCO/VICODIN) 5-325 MG per tablet Take 1 tablet by mouth every 12 (twelve) hours as needed for pain.      . Misc Natural Products (OSTEO BI-FLEX JOINT SHIELD) TABS Take 1 tablet by mouth daily.      . Multiple Vitamin (MULTIVITAMIN) tablet Take 1 tablet by mouth daily.      Marland Kitchen omeprazole (PRILOSEC OTC) 20 MG tablet Take 20 mg  by mouth daily.       No current facility-administered medications on file prior to visit.      Allergies  Allergen Reactions  . Novocain [Procaine]     Passed  out  . Limbrel [Flavocoxid]     Dizziness   . Sertraline Anxiety    ROS:  Out of a complete 14 system review of symptoms, the patient complains only of the following symptoms, and all other reviewed systems are negative.  Fatigue Easy bruising Feeling hot Skin sensitivity Dizziness Decreased energy, gait instability  Blood pressure 141/81, pulse 106, height 5\' 2"  (1.575 m), weight 117 lb (53.071 kg).  Physical Exam  General: The patient is alert and cooperative at the time of the examination.  Eyes: Pupils are equal, round, and reactive to light. Discs are flat bilaterally.  Neck: The neck is supple, no carotid bruits are noted.  Respiratory: The respiratory examination is clear.  Cardiovascular: The cardiovascular examination reveals a regular rate and rhythm, no obvious murmurs or rubs are noted.  Skin: Extremities are without significant edema.  Neurologic Exam  Mental status: The patient is alert and oriented x 3 at the time of the examination. The patient has apparent normal recent and remote memory, with an apparently normal attention span and concentration ability.  Cranial nerves: Facial symmetry is present. There is good sensation of the face to pinprick and soft touch bilaterally. The strength of the facial muscles and the muscles to head turning and shoulder shrug are normal bilaterally. Speech is well enunciated, no aphasia or dysarthria is noted. Extraocular movements are full. Visual fields are full. The tongue is midline, and the patient has symmetric elevation of the soft palate. No obvious hearing deficits are noted.  Motor: The motor testing reveals 5 over 5 strength of all 4 extremities, with exception of bilateral foot drops. Good symmetric motor tone is noted throughout.  Sensory:  Sensory testing is intact to pinprick, soft touch, vibration sensation, and position sense on the upper extremities. With the lower extremities, there is a stocking pattern pinprick sensory deficit one half way up the legs bilaterally. The patient has good symmetry with vibration sensation in the feet, but she has prominent impairment of position sense in both feet. No evidence of extinction is noted.  Coordination: Cerebellar testing reveals good finger-nose-finger and heel-to-shin bilaterally.  Gait  and station: Gait is slightly wide-based, the patient uses a cane for ambulation. Gait is slightly unsteady. Tandem gait was not attempted. Romberg is negative. No drift is seen.  Reflexes: Deep tendon reflexes are symmetric and normal bilaterally, and the ankle jerk reflexes are well-maintained bilaterally. Toes are downgoing bilaterally.     CT head 09/08/2013:  IMPRESSION:  No acute intracranial abnormality. No acute traumatic injury  identified. Stable non contrast CT appearance of the brain since  2014.    Assessment/Plan:  1. Benign essential tremor  2. Gait instability  The patient does have an essential tremor. Individuals with essential tremors oftentimes do report balance issues. This patient however, appears to have significant impairment of position sense in both feet, and there are bilateral foot drops. The ankle jerk reflexes however, are well-maintained. We will check nerve conduction studies of both legs and one arm, and EMG evaluation of at least one leg. If no evidence of a neuropathy or a lumbosacral radiculopathy is seen, we may consider evaluation of the cervical spine to exclude a low-grade cervical myelopathy. The patient is in physical therapy for gait training. We will see the patient back for the EMG evaluation. Blood work will be done today.  Jill Alexanders MD 12/21/2013 7:44 PM  [facility] [address] [place], [place]  [address]  Phone [phone] Fax [phone]

## 2013-12-22 DIAGNOSIS — R41841 Cognitive communication deficit: Secondary | ICD-10-CM | POA: Diagnosis not present

## 2013-12-23 LAB — VITAMIN B12: VITAMIN B 12: 736 pg/mL (ref 211–946)

## 2013-12-23 LAB — TSH: TSH: 1.04 u[IU]/mL (ref 0.450–4.500)

## 2013-12-23 LAB — COPPER, SERUM: COPPER: 105 ug/dL (ref 72–166)

## 2013-12-23 LAB — RPR: SYPHILIS RPR SCR: NONREACTIVE

## 2013-12-23 NOTE — Progress Notes (Signed)
Quick Note:  Spoke to patient and relayed lab results unremarkable, per Dr. Jannifer Franklin. ______

## 2013-12-27 DIAGNOSIS — R41841 Cognitive communication deficit: Secondary | ICD-10-CM | POA: Diagnosis not present

## 2013-12-28 ENCOUNTER — Ambulatory Visit (INDEPENDENT_AMBULATORY_CARE_PROVIDER_SITE_OTHER): Payer: Medicare Other | Admitting: Neurology

## 2013-12-28 ENCOUNTER — Encounter (INDEPENDENT_AMBULATORY_CARE_PROVIDER_SITE_OTHER): Payer: Self-pay

## 2013-12-28 DIAGNOSIS — M21371 Foot drop, right foot: Secondary | ICD-10-CM

## 2013-12-28 DIAGNOSIS — G609 Hereditary and idiopathic neuropathy, unspecified: Secondary | ICD-10-CM | POA: Diagnosis not present

## 2013-12-28 DIAGNOSIS — R269 Unspecified abnormalities of gait and mobility: Secondary | ICD-10-CM

## 2013-12-28 DIAGNOSIS — M21372 Foot drop, left foot: Secondary | ICD-10-CM

## 2013-12-28 DIAGNOSIS — Z0289 Encounter for other administrative examinations: Secondary | ICD-10-CM

## 2013-12-28 NOTE — Progress Notes (Signed)
[name] is an 80 year old patient with a history of a gait disorder, essential tremor, prior lumbosacral spine surgery, and a history of bilateral foot drops. The patient is being evaluate an overlying peripheral neuropathy as well.  Nerve conduction studies done on both lower extremities and the right upper extremity confirm the presence of a primarily axonal peripheral neuropathy of moderate severity. EMG of the right lower extremity shows distal chronic and acute denervation consistent with the diagnosis of peripheral neuropathy with an overlying chronic L5 and possibly S1 radiculopathy as well.  The patient likely has a multifactorial gait disorder associated with moderate cerebrovascular disease, syndrome of essential tremor, prior lumbosacral spine surgery and chronic radiculopathies, and a peripheral neuropathy. The patient will be sent for further blood work. She will followup in about 3 months.

## 2013-12-28 NOTE — Procedures (Signed)
     HISTORY:  [name] is an 80 year old patient with a history of an essential tremor and a progressive gait disorder. The patient has a history of lumbosacral spine surgery on 2 occasions. She ambulates with a cane. The patient is being evaluated for possible peripheral neuropathy as a contributing factor to her gait disorder.  NERVE CONDUCTION STUDIES:  Nerve conduction studies were performed on right upper extremity. The distal motor latencies and motor amplitudes for the median and ulnar nerves were within normal limits. The F wave latencies and nerve conduction velocities for these nerves were also normal. The sensory latencies for the median and ulnar nerves were normal.  Nerve conduction studies were performed on both lower extremities. No response was seen for the right peroneal nerve, with a normal distal motor latency and low motor amplitude for the left peroneal nerve. The distal motor latencies for the posterior tibial nerves were normal, with a borderline normal motor amplitude on the left, normal on the right. Nerve conduction velocities for the left peroneal nerve and for the posterior tibial nerves bilaterally were normal. The H reflex latencies were absent bilaterally, and the peroneal sensory latencies were absent bilaterally.  EMG STUDIES:  EMG study was performed on the right lower extremity:  The tibialis anterior muscle reveals 2 to 8K motor units with moderately decreased recruitment. 2+ fibrillations and positive waves were seen. The peroneus tertius muscle reveals no voluntary motor units with no recruitment. No fibrillations or positive waves were seen. The medial gastrocnemius muscle reveals up to 10K motor units with decreased recruitment. 2+ fibrillations and positive waves were seen. The vastus lateralis muscle reveals 2 to 8K motor units with decreased recruitment. No fibrillations or positive waves were seen. The iliopsoas muscle reveals 2 to 4K motor units  with full recruitment. No fibrillations or positive waves were seen. Complex repetitive discharges were seen. The biceps femoris muscle (long head) reveals 2 to 8K motor units with decreased recruitment. No fibrillations or positive waves were seen. The lumbosacral paraspinal muscles were tested at 3 levels, and revealed no abnormalities of insertional activity at all 3 levels tested. There was fair relaxation.   IMPRESSION:  Nerve conduction studies done on the right upper extremity and both lower extremities shows evidence of a peripheral neuropathy of moderate severity, primarily axonal. EMG evaluation of the right lower extremity shows primarily distal chronic and acute denervation consistent with the diagnosis of peripheral neuropathy, likely with an overlying primarily chronic L5 and possibly S1 radiculopathy as well.  Jill Alexanders MD 12/28/2013 5:02 PM  [facility] [address] [place], [place] [address]  Phone [phone] Fax [phone]

## 2013-12-29 ENCOUNTER — Telehealth: Payer: Self-pay | Admitting: Neurology

## 2013-12-29 DIAGNOSIS — R41841 Cognitive communication deficit: Secondary | ICD-10-CM | POA: Diagnosis not present

## 2013-12-29 NOTE — Telephone Encounter (Signed)
Patient calling to request NCV/EMG results, please return call and advise.

## 2013-12-29 NOTE — Telephone Encounter (Signed)
I called the patient. We had already discussed the results of the study yesterday, I left a message indicating the results showing evidence of a peripheral neuropathy. This is a likely contributing factor to her walking problems.   IMPRESSION:  Nerve conduction studies done on the right upper extremity and both lower extremities shows evidence of a peripheral neuropathy of moderate severity, primarily axonal. EMG evaluation of the right lower extremity shows primarily distal chronic and acute denervation consistent with the diagnosis of peripheral neuropathy, likely with an overlying primarily chronic L5 and possibly S1 radiculopathy as well.

## 2013-12-30 NOTE — Telephone Encounter (Signed)
Patient calling to state that she hasn't heard back about her results, told patient that Dr. Jannifer Franklin had discussed it with her and had left her a message, patient states she never received the message. Please return call and advise.

## 2013-12-30 NOTE — Telephone Encounter (Signed)
I finally contacted the patient. I discussed the results of the nerve conduction studies and EMG. The patient is in physical therapy for balance training. This issue it is not curable, needs to be managed with behavior that improves safety.

## 2013-12-31 DIAGNOSIS — R41841 Cognitive communication deficit: Secondary | ICD-10-CM | POA: Diagnosis not present

## 2014-01-03 DIAGNOSIS — R41841 Cognitive communication deficit: Secondary | ICD-10-CM | POA: Diagnosis not present

## 2014-01-05 DIAGNOSIS — R41841 Cognitive communication deficit: Secondary | ICD-10-CM | POA: Diagnosis not present

## 2014-01-07 DIAGNOSIS — R41841 Cognitive communication deficit: Secondary | ICD-10-CM | POA: Diagnosis not present

## 2014-01-10 DIAGNOSIS — Z23 Encounter for immunization: Secondary | ICD-10-CM | POA: Diagnosis not present

## 2014-01-10 DIAGNOSIS — R2681 Unsteadiness on feet: Secondary | ICD-10-CM | POA: Diagnosis not present

## 2014-01-10 DIAGNOSIS — H04129 Dry eye syndrome of unspecified lacrimal gland: Secondary | ICD-10-CM | POA: Diagnosis not present

## 2014-01-10 DIAGNOSIS — G629 Polyneuropathy, unspecified: Secondary | ICD-10-CM | POA: Diagnosis not present

## 2014-01-10 DIAGNOSIS — R296 Repeated falls: Secondary | ICD-10-CM | POA: Diagnosis not present

## 2014-01-12 DIAGNOSIS — R41841 Cognitive communication deficit: Secondary | ICD-10-CM | POA: Diagnosis not present

## 2014-01-17 DIAGNOSIS — R41841 Cognitive communication deficit: Secondary | ICD-10-CM | POA: Diagnosis not present

## 2014-01-19 DIAGNOSIS — R41841 Cognitive communication deficit: Secondary | ICD-10-CM | POA: Diagnosis not present

## 2014-01-21 DIAGNOSIS — R41841 Cognitive communication deficit: Secondary | ICD-10-CM | POA: Diagnosis not present

## 2014-02-01 DIAGNOSIS — H25042 Posterior subcapsular polar age-related cataract, left eye: Secondary | ICD-10-CM | POA: Diagnosis not present

## 2014-02-01 DIAGNOSIS — H11823 Conjunctivochalasis, bilateral: Secondary | ICD-10-CM | POA: Diagnosis not present

## 2014-02-02 ENCOUNTER — Encounter: Payer: Self-pay | Admitting: Neurology

## 2014-02-08 ENCOUNTER — Encounter: Payer: Self-pay | Admitting: Neurology

## 2014-02-17 DIAGNOSIS — M79672 Pain in left foot: Secondary | ICD-10-CM | POA: Diagnosis not present

## 2014-02-17 DIAGNOSIS — M1712 Unilateral primary osteoarthritis, left knee: Secondary | ICD-10-CM | POA: Diagnosis not present

## 2014-02-17 DIAGNOSIS — M79671 Pain in right foot: Secondary | ICD-10-CM | POA: Diagnosis not present

## 2014-02-17 DIAGNOSIS — M19072 Primary osteoarthritis, left ankle and foot: Secondary | ICD-10-CM | POA: Diagnosis not present

## 2014-02-17 DIAGNOSIS — M159 Polyosteoarthritis, unspecified: Secondary | ICD-10-CM | POA: Diagnosis not present

## 2014-02-17 DIAGNOSIS — M1711 Unilateral primary osteoarthritis, right knee: Secondary | ICD-10-CM | POA: Diagnosis not present

## 2014-02-17 DIAGNOSIS — M064 Inflammatory polyarthropathy: Secondary | ICD-10-CM | POA: Diagnosis not present

## 2014-02-17 DIAGNOSIS — M2550 Pain in unspecified joint: Secondary | ICD-10-CM | POA: Diagnosis not present

## 2014-02-17 DIAGNOSIS — M19071 Primary osteoarthritis, right ankle and foot: Secondary | ICD-10-CM | POA: Diagnosis not present

## 2014-02-21 ENCOUNTER — Emergency Department (HOSPITAL_COMMUNITY): Payer: Medicare Other

## 2014-02-21 ENCOUNTER — Encounter (HOSPITAL_COMMUNITY): Payer: Self-pay | Admitting: Emergency Medicine

## 2014-02-21 ENCOUNTER — Inpatient Hospital Stay (HOSPITAL_COMMUNITY)
Admission: EM | Admit: 2014-02-21 | Discharge: 2014-02-24 | DRG: 481 | Disposition: A | Discharge status: Skilled Nursing Facility | Payer: Medicare Other | Attending: Internal Medicine | Admitting: Internal Medicine

## 2014-02-21 DIAGNOSIS — M21251 Flexion deformity, right hip: Secondary | ICD-10-CM | POA: Diagnosis not present

## 2014-02-21 DIAGNOSIS — M25551 Pain in right hip: Secondary | ICD-10-CM | POA: Diagnosis present

## 2014-02-21 DIAGNOSIS — K219 Gastro-esophageal reflux disease without esophagitis: Secondary | ICD-10-CM | POA: Diagnosis present

## 2014-02-21 DIAGNOSIS — Z9841 Cataract extraction status, right eye: Secondary | ICD-10-CM | POA: Diagnosis not present

## 2014-02-21 DIAGNOSIS — R42 Dizziness and giddiness: Secondary | ICD-10-CM | POA: Diagnosis not present

## 2014-02-21 DIAGNOSIS — J449 Chronic obstructive pulmonary disease, unspecified: Secondary | ICD-10-CM | POA: Diagnosis present

## 2014-02-21 DIAGNOSIS — S7290XA Unspecified fracture of unspecified femur, initial encounter for closed fracture: Secondary | ICD-10-CM

## 2014-02-21 DIAGNOSIS — M5134 Other intervertebral disc degeneration, thoracic region: Secondary | ICD-10-CM | POA: Diagnosis not present

## 2014-02-21 DIAGNOSIS — R41841 Cognitive communication deficit: Secondary | ICD-10-CM | POA: Diagnosis not present

## 2014-02-21 DIAGNOSIS — S0990XA Unspecified injury of head, initial encounter: Secondary | ICD-10-CM | POA: Diagnosis present

## 2014-02-21 DIAGNOSIS — S72001A Fracture of unspecified part of neck of right femur, initial encounter for closed fracture: Secondary | ICD-10-CM | POA: Diagnosis not present

## 2014-02-21 DIAGNOSIS — I679 Cerebrovascular disease, unspecified: Secondary | ICD-10-CM | POA: Diagnosis present

## 2014-02-21 DIAGNOSIS — Z79899 Other long term (current) drug therapy: Secondary | ICD-10-CM | POA: Diagnosis not present

## 2014-02-21 DIAGNOSIS — S72141A Displaced intertrochanteric fracture of right femur, initial encounter for closed fracture: Secondary | ICD-10-CM

## 2014-02-21 DIAGNOSIS — F339 Major depressive disorder, recurrent, unspecified: Secondary | ICD-10-CM | POA: Diagnosis present

## 2014-02-21 DIAGNOSIS — S7221XA Displaced subtrochanteric fracture of right femur, initial encounter for closed fracture: Secondary | ICD-10-CM | POA: Diagnosis not present

## 2014-02-21 DIAGNOSIS — F17200 Nicotine dependence, unspecified, uncomplicated: Secondary | ICD-10-CM | POA: Diagnosis present

## 2014-02-21 DIAGNOSIS — R52 Pain, unspecified: Secondary | ICD-10-CM | POA: Diagnosis not present

## 2014-02-21 DIAGNOSIS — E785 Hyperlipidemia, unspecified: Secondary | ICD-10-CM | POA: Diagnosis present

## 2014-02-21 DIAGNOSIS — D649 Anemia, unspecified: Secondary | ICD-10-CM | POA: Diagnosis not present

## 2014-02-21 DIAGNOSIS — M069 Rheumatoid arthritis, unspecified: Secondary | ICD-10-CM | POA: Diagnosis present

## 2014-02-21 DIAGNOSIS — R2681 Unsteadiness on feet: Secondary | ICD-10-CM | POA: Diagnosis not present

## 2014-02-21 DIAGNOSIS — D62 Acute posthemorrhagic anemia: Secondary | ICD-10-CM | POA: Diagnosis not present

## 2014-02-21 DIAGNOSIS — E876 Hypokalemia: Secondary | ICD-10-CM | POA: Diagnosis not present

## 2014-02-21 DIAGNOSIS — F419 Anxiety disorder, unspecified: Secondary | ICD-10-CM | POA: Diagnosis present

## 2014-02-21 DIAGNOSIS — Y92099 Unspecified place in other non-institutional residence as the place of occurrence of the external cause: Secondary | ICD-10-CM

## 2014-02-21 DIAGNOSIS — W11XXXA Fall on and from ladder, initial encounter: Secondary | ICD-10-CM | POA: Diagnosis present

## 2014-02-21 DIAGNOSIS — Z0389 Encounter for observation for other suspected diseases and conditions ruled out: Secondary | ICD-10-CM | POA: Diagnosis not present

## 2014-02-21 DIAGNOSIS — S299XXA Unspecified injury of thorax, initial encounter: Secondary | ICD-10-CM | POA: Diagnosis not present

## 2014-02-21 DIAGNOSIS — F329 Major depressive disorder, single episode, unspecified: Secondary | ICD-10-CM | POA: Diagnosis present

## 2014-02-21 DIAGNOSIS — S098XXA Other specified injuries of head, initial encounter: Secondary | ICD-10-CM | POA: Diagnosis not present

## 2014-02-21 DIAGNOSIS — J984 Other disorders of lung: Secondary | ICD-10-CM | POA: Diagnosis not present

## 2014-02-21 DIAGNOSIS — E559 Vitamin D deficiency, unspecified: Secondary | ICD-10-CM | POA: Diagnosis present

## 2014-02-21 DIAGNOSIS — R269 Unspecified abnormalities of gait and mobility: Secondary | ICD-10-CM | POA: Diagnosis not present

## 2014-02-21 DIAGNOSIS — S4991XA Unspecified injury of right shoulder and upper arm, initial encounter: Secondary | ICD-10-CM | POA: Diagnosis not present

## 2014-02-21 DIAGNOSIS — M4800 Spinal stenosis, site unspecified: Secondary | ICD-10-CM | POA: Diagnosis not present

## 2014-02-21 DIAGNOSIS — W19XXXA Unspecified fall, initial encounter: Secondary | ICD-10-CM | POA: Diagnosis not present

## 2014-02-21 DIAGNOSIS — Z9842 Cataract extraction status, left eye: Secondary | ICD-10-CM

## 2014-02-21 DIAGNOSIS — Z79891 Long term (current) use of opiate analgesic: Secondary | ICD-10-CM

## 2014-02-21 DIAGNOSIS — Z4889 Encounter for other specified surgical aftercare: Secondary | ICD-10-CM | POA: Diagnosis not present

## 2014-02-21 DIAGNOSIS — M810 Age-related osteoporosis without current pathological fracture: Secondary | ICD-10-CM | POA: Diagnosis present

## 2014-02-21 DIAGNOSIS — Y92009 Unspecified place in unspecified non-institutional (private) residence as the place of occurrence of the external cause: Secondary | ICD-10-CM | POA: Diagnosis not present

## 2014-02-21 DIAGNOSIS — Y93D9 Activity, other involving arts and handcrafts: Secondary | ICD-10-CM | POA: Diagnosis not present

## 2014-02-21 DIAGNOSIS — I10 Essential (primary) hypertension: Secondary | ICD-10-CM | POA: Diagnosis present

## 2014-02-21 DIAGNOSIS — R278 Other lack of coordination: Secondary | ICD-10-CM | POA: Diagnosis not present

## 2014-02-21 DIAGNOSIS — M1990 Unspecified osteoarthritis, unspecified site: Secondary | ICD-10-CM | POA: Diagnosis present

## 2014-02-21 DIAGNOSIS — S42101A Fracture of unspecified part of scapula, right shoulder, initial encounter for closed fracture: Secondary | ICD-10-CM | POA: Diagnosis not present

## 2014-02-21 DIAGNOSIS — M6281 Muscle weakness (generalized): Secondary | ICD-10-CM | POA: Diagnosis not present

## 2014-02-21 LAB — CBC WITH DIFFERENTIAL/PLATELET
Basophils Absolute: 0 10*3/uL (ref 0.0–0.1)
Basophils Relative: 0 % (ref 0–1)
EOS ABS: 0.1 10*3/uL (ref 0.0–0.7)
Eosinophils Relative: 1 % (ref 0–5)
HCT: 42.9 % (ref 36.0–46.0)
HEMOGLOBIN: 14 g/dL (ref 12.0–15.0)
Lymphocytes Relative: 17 % (ref 12–46)
Lymphs Abs: 1.3 10*3/uL (ref 0.7–4.0)
MCH: 33.6 pg (ref 26.0–34.0)
MCHC: 32.6 g/dL (ref 30.0–36.0)
MCV: 102.9 fL — ABNORMAL HIGH (ref 78.0–100.0)
MONO ABS: 0.7 10*3/uL (ref 0.1–1.0)
Monocytes Relative: 9 % (ref 3–12)
NEUTROS PCT: 73 % (ref 43–77)
Neutro Abs: 5.6 10*3/uL (ref 1.7–7.7)
Platelets: 209 10*3/uL (ref 150–400)
RBC: 4.17 MIL/uL (ref 3.87–5.11)
RDW: 13 % (ref 11.5–15.5)
WBC: 7.6 10*3/uL (ref 4.0–10.5)

## 2014-02-21 LAB — BASIC METABOLIC PANEL
Anion gap: 11 (ref 5–15)
BUN: 22 mg/dL (ref 6–23)
CALCIUM: 9.6 mg/dL (ref 8.4–10.5)
CO2: 29 mEq/L (ref 19–32)
CREATININE: 0.95 mg/dL (ref 0.50–1.10)
Chloride: 101 mEq/L (ref 96–112)
GFR calc non Af Amer: 52 mL/min — ABNORMAL LOW (ref >90)
GFR, EST AFRICAN AMERICAN: 61 mL/min — AB (ref >90)
Glucose, Bld: 128 mg/dL — ABNORMAL HIGH (ref 70–99)
Potassium: 3.8 mEq/L (ref 3.7–5.3)
Sodium: 141 mEq/L (ref 137–147)

## 2014-02-21 LAB — TYPE AND SCREEN
ABO/RH(D): A POS
Antibody Screen: NEGATIVE

## 2014-02-21 LAB — PROTIME-INR
INR: 1.12 (ref 0.00–1.49)
Prothrombin Time: 14.6 seconds (ref 11.6–15.2)

## 2014-02-21 MED ORDER — MORPHINE SULFATE 2 MG/ML IJ SOLN: 2.0000 mg | INTRAMUSCULAR | Status: DC | PRN

## 2014-02-21 MED ORDER — SODIUM CHLORIDE 0.9 % IJ SOLN
3.0000 mL | Freq: Two times a day (BID) | INTRAMUSCULAR | Status: DC
  Administered 2014-02-21: 3 mL via INTRAVENOUS

## 2014-02-21 MED ORDER — DOCUSATE SODIUM 100 MG PO CAPS
100.0000 mg | ORAL_CAPSULE | Freq: Two times a day (BID) | ORAL | Status: DC
  Administered 2014-02-21 – 2014-02-22 (×2): 100 mg via ORAL
  Filled 2014-02-21 (×3): qty 1

## 2014-02-21 MED ORDER — ACETAMINOPHEN 650 MG RE SUPP: 650.0000 mg | Freq: Four times a day (QID) | RECTAL | Status: DC | PRN

## 2014-02-21 MED ORDER — ONDANSETRON HCL 4 MG/2ML IJ SOLN: 4.0000 mg | Freq: Four times a day (QID) | INTRAMUSCULAR | Status: DC | PRN

## 2014-02-21 MED ORDER — ONDANSETRON HCL 4 MG PO TABS: 4.0000 mg | ORAL_TABLET | Freq: Four times a day (QID) | ORAL | Status: DC | PRN

## 2014-02-21 MED ORDER — ALUM & MAG HYDROXIDE-SIMETH 200-200-20 MG/5ML PO SUSP: 30.0000 mL | Freq: Four times a day (QID) | ORAL | Status: DC | PRN

## 2014-02-21 MED ORDER — SODIUM CHLORIDE 0.9 % IV SOLN
INTRAVENOUS | Status: DC
  Administered 2014-02-21: 23:00:00 via INTRAVENOUS

## 2014-02-21 MED ORDER — OXYCODONE HCL 5 MG PO TABS
5.0000 mg | ORAL_TABLET | ORAL | Status: DC | PRN
  Administered 2014-02-22: 5 mg via ORAL
  Filled 2014-02-21: qty 1

## 2014-02-21 MED ORDER — ACETAMINOPHEN 325 MG PO TABS
650.0000 mg | ORAL_TABLET | Freq: Four times a day (QID) | ORAL | Status: DC | PRN
  Administered 2014-02-22: 650 mg via ORAL
  Filled 2014-02-21: qty 2

## 2014-02-21 MED ORDER — CITALOPRAM HYDROBROMIDE 10 MG PO TABS
10.0000 mg | ORAL_TABLET | Freq: Every day | ORAL | Status: DC
  Administered 2014-02-21 – 2014-02-24 (×4): 10 mg via ORAL
  Filled 2014-02-21 (×4): qty 1

## 2014-02-21 MED ORDER — FENTANYL CITRATE 0.05 MG/ML IJ SOLN
50.0000 ug | INTRAMUSCULAR | Status: AC | PRN
  Administered 2014-02-21 (×3): 50 ug via INTRAVENOUS
  Filled 2014-02-21 (×3): qty 2

## 2014-02-21 NOTE — H&P (Addendum)
[facility] History and Physical  [name] HQI:[id number] DOB: [birth year] DOA: 02/21/2014  Referring physician:  PCP: Gerrit Heck, MD   Chief Complaint: Right hip pain/status post fall  HPI: [name] is a 80 y.o. female with a history of gait disorder, currently being seen by Dr. Jannifer Franklin of neurology with previous workups including MRI of brain performed on 01/21/2011 that showed moderate small vessel disease, gastroesophageal reflux disease, he was transferred to the emergency department from her independent living facility after sustaining a fall earlier in the evening. Patient reporting being her usual state health, was at home hanging a Christmas wreath losing her balance and falling backwards. There was no loss of consciousness. She experienced immediate pain to her right hip and was unable to bear weight. She activated life alert system with staff members responding and calling EMS. She denied any symptoms prior to the event and attributes her fall to losing her balance. Imaging studies performed in the emergency department revealed a comminuted intertrochanteric fracture with extension into the proximal femoral shaft. Dr Mardelle Matte of orthopedic surgery was consulted who recommended that patient be transferred to [facility] likely to undergo ORIF in a.m.                                                                                     Review of Systems:  Constitutional:  No weight loss, night sweats, Fevers, chills, fatigue.  HEENT:  No headaches, Difficulty swallowing,Tooth/dental problems,Sore throat,  No sneezing, itching, ear ache, nasal congestion, post nasal drip,  Cardio-vascular:  No chest pain, Orthopnea, PND, swelling in lower extremities, anasarca, dizziness, palpitations  GI:  No heartburn, indigestion, abdominal pain, nausea, vomiting, diarrhea, change in bowel habits, loss of appetite  Resp:  No shortness of breath with exertion or at  rest. No excess mucus, no productive cough, No non-productive cough, No coughing up of blood.No change in color of mucus.No wheezing.No chest wall deformity  Skin:  no rash or lesions.  GU:  no dysuria, change in color of urine, no urgency or frequency. No flank pain.  Musculoskeletal:  Positive for right hip pain. No decreased range of motion. No back pain.  Psych:  No change in mood or affect. No depression or anxiety. No memory loss.   Past Medical History  Diagnosis Date  . Pain in joint, upper arm   . Disturbance of skin sensation   . Other abnormal clinical finding   . Disturbance of skin sensation   . Unspecified vitamin D deficiency   . Tobacco use disorder   . Chronic airway obstruction, not elsewhere classified   . Hypertension   . GERD (gastroesophageal reflux disease)   . Anemia     iron deficient  . Chronic back pain   . Chronic cystitis   . Hernia   . Depression   . Anxiety   . Essential and other specified forms of tremor 12/11/2012  . Osteoporosis   . Degenerative arthritis   . History of cerebrovascular disease     Moderate level small vessel disease  . Gait disorder   . Dyslipidemia   . Osteoarthritis     right  knee injection per Dr Trudie Reed  . Hyperlipidemia     mild  . Peripheral vascular disease   . Hemorrhoids   . Esophageal reflux   . Dyspnea     Dr Chase Caller  . Rheumatoid arthritis     Dr Trudie Reed  . Cystitis, chronic     Dr Gaynelle Arabian  . Tremor     and gait disorder--Dr Jannifer Franklin  . Foot drop, bilateral 12/21/2013   Past Surgical History  Procedure Laterality Date  . Back surgery Bilateral     X2  . Appendectomy    . Cataract extraction Bilateral   . Bladder resuspension procedure    . Tonsillectomy    . Inguinal hernia repair Bilateral    Social History:  reports that she has never smoked. She has never used smokeless tobacco. She reports that she does not drink alcohol or use illicit drugs.  Allergies  Allergen Reactions  . Novocain  [Procaine]     Passed  out  . Limbrel [Flavocoxid]     Dizziness   . Sertraline Anxiety    Family History  Problem Relation Age of Onset  . Heart Problems Mother   . Diabetes Father   . Heart attack Father   . Cancer Sister     Brain tumor  . Emphysema Brother      Prior to Admission medications   Medication Sig Start Date End Date Taking? Authorizing Provider  acetaminophen (TYLENOL) 650 MG CR tablet Take 650 mg by mouth 2 (two) times daily.   Yes Historical Provider, MD  Calcium Carbonate-Vitamin D (CALTRATE 600+D) 600-400 MG-UNIT per tablet Take 1 tablet by mouth daily.   Yes Historical Provider, MD  cholecalciferol (VITAMIN D) 1000 UNITS tablet Take 1,000 Units by mouth daily.   Yes Historical Provider, MD  diclofenac sodium (VOLTAREN) 1 % GEL Apply 4 g topically 4 (four) times daily.   Yes Historical Provider, MD  fish oil-omega-3 fatty acids 1000 MG capsule Take 2 g by mouth daily.   Yes Historical Provider, MD  hydrochlorothiazide (MICROZIDE) 12.5 MG capsule Take 12.5 mg by mouth daily.   Yes Historical Provider, MD  HYDROcodone-acetaminophen (NORCO/VICODIN) 5-325 MG per tablet Take 1 tablet by mouth every 12 (twelve) hours as needed for pain.   Yes Historical Provider, MD  Misc Natural Products (OSTEO BI-FLEX JOINT SHIELD) TABS Take 1 tablet by mouth daily.   Yes Historical Provider, MD  Multiple Vitamin (MULTIVITAMIN) tablet Take 1 tablet by mouth daily.   Yes Historical Provider, MD  citalopram (CELEXA) 10 MG tablet Take 10 mg by mouth daily.     Historical Provider, MD  ferrous sulfate 325 (65 FE) MG tablet Take 325 mg by mouth daily with breakfast.    Historical Provider, MD  omeprazole (PRILOSEC OTC) 20 MG tablet Take 20 mg by mouth daily.    Historical Provider, MD   Physical Exam: Filed Vitals:   02/21/14 1705 02/21/14 1707 02/21/14 1742 02/21/14 1839  BP:  172/81 143/65 149/75  Pulse:  90 82 86  Temp:  98.2 F (36.8 C)    TempSrc:  Oral    Resp:  20 22 22     SpO2: 97% 96% 98% 96%    Wt Readings from Last 3 Encounters:  12/21/13 53.071 kg (117 lb)  12/11/12 53.978 kg (119 lb)  10/27/12 72.122 kg (159 lb)    General:  Appears calm in no acute distress, she is awake, alert, oriented 3 Eyes: PERRL, normal lids, irises & conjunctiva ENT:  grossly normal hearing, lips & tongue Neck: no LAD, masses or thyromegaly Cardiovascular: RRR, no m/r/g. No LE edema. Telemetry: SR, no arrhythmias  Respiratory: CTA bilaterally, no w/r/r. Normal respiratory effort. Abdomen: soft, ntnd Skin: no rash or induration seen on limited exam Musculoskeletal: Her right lower extremity is externally rotated, she is unable to move this extremity, there is pain with passive and active movement. No cyanosis clubbing or edema Psychiatric: grossly normal mood and affect, speech fluent and appropriate Neurologic: grossly non-focal.          Labs on Admission:  Basic Metabolic Panel:  Recent Labs Lab 02/21/14 1734  NA 141  K 3.8  CL 101  CO2 29  GLUCOSE 128*  BUN 22  CREATININE 0.95  CALCIUM 9.6   Liver Function Tests: No results for input(s): AST, ALT, ALKPHOS, BILITOT, PROT, ALBUMIN in the last 168 hours. No results for input(s): LIPASE, AMYLASE in the last 168 hours. No results for input(s): AMMONIA in the last 168 hours. CBC:  Recent Labs Lab 02/21/14 1734  WBC 7.6  NEUTROABS 5.6  HGB 14.0  HCT 42.9  MCV 102.9*  PLT 209   Cardiac Enzymes: No results for input(s): CKTOTAL, CKMB, CKMBINDEX, TROPONINI in the last 168 hours.  BNP (last 3 results) No results for input(s): PROBNP in the last 8760 hours. CBG: No results for input(s): GLUCAP in the last 168 hours.  Radiological Exams on Admission: Dg Chest 1 View  02/21/2014   CLINICAL DATA:  Fall.  Deformity to right hip.  EXAM: CHEST - 1 VIEW  COMPARISON:  03/27/2010  FINDINGS: Normal heart size. No pleural effusion or edema. Lung volumes are low. The patient is status post hardware fusion of  the lower thoracic and lumbar spine. There is a an age indeterminate deformity involving the scapula. This is a new abnormality when compared with previous exams.  IMPRESSION: 1. No active cardiopulmonary abnormalities. 2. Age indeterminate scapular fracture. Recommend dedicated images of the right shoulder and scapula.   Electronically Signed   By: Kerby Moors M.D.   On: 02/21/2014 18:48   Dg Hip Complete Right  02/21/2014   CLINICAL DATA:  Fall from standing position on to right hip  EXAM: RIGHT HIP - COMPLETE 2+ VIEW  COMPARISON:  None.  FINDINGS: There are changes consistent with a comminuted intratrochanteric fracture with extension into the proximal femoral shaft. Impaction and angulation is noted at the fracture site. The femoral head is well seated. The pelvic ring is intact. Postsurgical changes are noted in the lumbar spine as well as in the anterior abdominal wall.  IMPRESSION: Comminuted intertrochanteric fracture with extension into the proximal femoral shaft.   Electronically Signed   By: Inez Catalina M.D.   On: 02/21/2014 18:52   Dg Femur Right  02/21/2014   CLINICAL DATA:  Per EMS pt from [facility] hanging wreath on door resulting in fall and deformity to right hip with severe right hip pain.  EXAM: RIGHT FEMUR - 2 VIEW  COMPARISON:  None.  FINDINGS: There is comminuted displaced fracture of the proximal right femur. Fracture extends from the intertrochanteric portion to the proximal shaft. There is a butterfly fragment of the proximal shaft, displaced 19 mm medial to the distal shaft fracture component. There is a separate fracture the lesser trochanter, which is displaced more medially. The distal shaft fracture component is also displaced anteriorly in relation to the proximal shaft fracture component, by 18 mm. There is mild varus angulation between  the shaft and femoral neck.  No other fractures. Right hip joint is normally spaced and aligned. Knee joint is normally aligned.   IMPRESSION: Comminuted, displaced fracture of the proximal right femur as described. No dislocation.   Electronically Signed   By: Lajean Manes M.D.   On: 02/21/2014 18:48   Ct Head Wo Contrast  02/21/2014   CLINICAL DATA:  Gait disturbance.  Fall.  Dizziness.  EXAM: CT HEAD WITHOUT CONTRAST  TECHNIQUE: Contiguous axial images were obtained from the base of the skull through the vertex without intravenous contrast.  COMPARISON:  09/08/2013  FINDINGS: Low attenuation throughout the subcortical and periventricular white matter identified compatible with small vessel ischemic disease. Left occipital lobe encephalomalacia appears similar to previous exam. There is no evidence for acute brain infarct, hemorrhage or mass. The paranasal sinuses are clear. The mastoid air cells are also clear. The skull is intact.  IMPRESSION: 1. No acute intracranial abnormalities. 2. Advanced, chronic microvascular ischemic disease.   Electronically Signed   By: Kerby Moors M.D.   On: 02/21/2014 18:30    EKG: Independently reviewed. Sinus rhythm  Assessment/Plan Principal Problem:   Fracture, intertrochanteric, right femur Active Problems:   Gait disorder   Fall at home   Head injury   Depression   1. Right intertrochanteric hip fracture. Patient having a fall this evening which was likely mechanical. She reported being her usual state of health, was hanging a Christmas wreath on the door losing her balance falling backwards and injuring her right hip. She denied chest pain, shortness breath, dizziness palpitations or any other symptoms prior to this event. Plain film done in the emergency department revealed a comminuted displaced fracture of the proximal right femur. Dr.Landau of orthopedic surgery was consulted from the emergency department, recommended a transfer to [facility] where she will likely undergo ORIF tomorrow. Patient does not appear to have active cardiopulmonary issues, blood work performed in the  emergency department unremarkable, EKG showing sinus rhythm. She is highly functional, able to perform activities of daily living independently. I spoke with Dr Alcario Drought at [facility] who agreed to accept patient in transfer.   2. Pain management. Will provide as needed oxycodone for pain, as needed IV morphine 2 mg every 4 hours for severe breakthrough pain. Place patient on bowel regimen 3. Major depression. Stable, continue SSRI 4. DVT prophylaxis. SCDs    Code Status: Full code for now. She wishes to speak to her son further regarding CODE STATUS DVT Prophylaxis: SCDs Family Communication:  Disposition Plan: Will arrange for transfer to [facility], plan for ORIF in a.m., anticipate she'll require greater than 2 night hospitalization  Time spent: 65 min  Kelvin Cellar [facility] Pager [phone]

## 2014-02-21 NOTE — ED Notes (Signed)
Awake. Verbally responsive. A/O x4. Resp even and unlabored. No audible adventitious breath sounds noted. ABC's intact. SR on monitor at 99bpm. Noted deformity of rt femur with outer rotation of rt leg. (+)PMS, CRT brisk. Pt given bedpan and void moderate amt.

## 2014-02-21 NOTE — ED Notes (Signed)
[facility] arrived to transport pt to [facility]. 

## 2014-02-21 NOTE — ED Notes (Signed)
Rotation and shortening of right hip. Moderate right pedal pulse.

## 2014-02-21 NOTE — ED Provider Notes (Signed)
CSN: [id number]     Arrival date & time 02/21/14  1705 History   First MD Initiated Contact with Patient 02/21/14 1714     Chief Complaint  Patient presents with  . Fall  . Hip Pain      HPI Patient reports that she fell from a step stool this afternoon while putting up Christmas decorating.  She fell backwards and injured her right hip.  She also reports she hit her head.  No loss conscious.  Denies neck pain.  No weakness of her arms or legs.  She reports moderate to severe pain in the right hip with range of motion.  Presents with shortening and external rotation of her right lower extremity.  No recent illness.  No fevers or chills.  Denies abdominal pain.  No shortness of breath.   Past Medical History  Diagnosis Date  . Pain in joint, upper arm   . Disturbance of skin sensation   . Other abnormal clinical finding   . Disturbance of skin sensation   . Unspecified vitamin D deficiency   . Tobacco use disorder   . Chronic airway obstruction, not elsewhere classified   . Hypertension   . GERD (gastroesophageal reflux disease)   . Anemia     iron deficient  . Chronic back pain   . Chronic cystitis   . Hernia   . Depression   . Anxiety   . Essential and other specified forms of tremor 12/11/2012  . Osteoporosis   . Degenerative arthritis   . History of cerebrovascular disease     Moderate level small vessel disease  . Gait disorder   . Dyslipidemia   . Osteoarthritis     right knee injection per Dr Trudie Reed  . Hyperlipidemia     mild  . Peripheral vascular disease   . Hemorrhoids   . Esophageal reflux   . Dyspnea     Dr Chase Caller  . Rheumatoid arthritis     Dr Trudie Reed  . Cystitis, chronic     Dr Gaynelle Arabian  . Tremor     and gait disorder--Dr Jannifer Franklin  . Foot drop, bilateral 12/21/2013   Past Surgical History  Procedure Laterality Date  . Back surgery Bilateral     X2  . Appendectomy    . Cataract extraction Bilateral   . Bladder resuspension procedure    .  Tonsillectomy    . Inguinal hernia repair Bilateral    Family History  Problem Relation Age of Onset  . Heart Problems Mother   . Diabetes Father   . Heart attack Father   . Cancer Sister     Brain tumor  . Emphysema Brother    History  Substance Use Topics  . Smoking status: Never Smoker   . Smokeless tobacco: Never Used  . Alcohol Use: No   OB History    No data available     Review of Systems  All other systems reviewed and are negative.     Allergies  Novocain; Limbrel; and Sertraline  Home Medications   Prior to Admission medications   Medication Sig Start Date End Date Taking? Authorizing Provider  acetaminophen (TYLENOL) 650 MG CR tablet Take 650 mg by mouth 2 (two) times daily.   Yes Historical Provider, MD  Calcium Carbonate-Vitamin D (CALTRATE 600+D) 600-400 MG-UNIT per tablet Take 1 tablet by mouth daily.   Yes Historical Provider, MD  cholecalciferol (VITAMIN D) 1000 UNITS tablet Take 1,000 Units by mouth daily.   Yes  Historical Provider, MD  diclofenac sodium (VOLTAREN) 1 % GEL Apply 4 g topically 4 (four) times daily.   Yes Historical Provider, MD  fish oil-omega-3 fatty acids 1000 MG capsule Take 2 g by mouth daily.   Yes Historical Provider, MD  hydrochlorothiazide (MICROZIDE) 12.5 MG capsule Take 12.5 mg by mouth daily.   Yes Historical Provider, MD  HYDROcodone-acetaminophen (NORCO/VICODIN) 5-325 MG per tablet Take 1 tablet by mouth every 12 (twelve) hours as needed for pain.   Yes Historical Provider, MD  Misc Natural Products (OSTEO BI-FLEX JOINT SHIELD) TABS Take 1 tablet by mouth daily.   Yes Historical Provider, MD  Multiple Vitamin (MULTIVITAMIN) tablet Take 1 tablet by mouth daily.   Yes Historical Provider, MD  citalopram (CELEXA) 10 MG tablet Take 10 mg by mouth daily.     Historical Provider, MD  ferrous sulfate 325 (65 FE) MG tablet Take 325 mg by mouth daily with breakfast.    Historical Provider, MD  omeprazole (PRILOSEC OTC) 20 MG tablet  Take 20 mg by mouth daily.    Historical Provider, MD   BP 149/75 mmHg  Pulse 86  Temp(Src) 98.2 F (36.8 C) (Oral)  Resp 22  SpO2 96% Physical Exam  Constitutional: She is oriented to person, place, and time. She appears well-developed and well-nourished. No distress.  HENT:  Head: Normocephalic and atraumatic.  Eyes: EOM are normal.  Neck: Normal range of motion.  Cardiovascular: Normal rate, regular rhythm and normal heart sounds.   Pulmonary/Chest: Effort normal and breath sounds normal.  Abdominal: Soft. She exhibits no distension. There is no tenderness.  Musculoskeletal: Normal range of motion.  Pain with range of motion the right hip.  Patient's right lower extremity with shortening and external rotation.  Neurological: She is alert and oriented to person, place, and time.  Skin: Skin is warm and dry.  Psychiatric: She has a normal mood and affect. Judgment normal.  Nursing note and vitals reviewed.   ED Course  Procedures (including critical care time) Labs Review Labs Reviewed  BASIC METABOLIC PANEL - Abnormal; Notable for the following:    Glucose, Bld 128 (*)    GFR calc non Af Amer 52 (*)    GFR calc Af Amer 61 (*)    All other components within normal limits  CBC WITH DIFFERENTIAL - Abnormal; Notable for the following:    MCV 102.9 (*)    All other components within normal limits  PROTIME-INR  TYPE AND SCREEN    Imaging Review Dg Chest 1 View  02/21/2014   CLINICAL DATA:  Fall.  Deformity to right hip.  EXAM: CHEST - 1 VIEW  COMPARISON:  03/27/2010  FINDINGS: Normal heart size. No pleural effusion or edema. Lung volumes are low. The patient is status post hardware fusion of the lower thoracic and lumbar spine. There is a an age indeterminate deformity involving the scapula. This is a new abnormality when compared with previous exams.  IMPRESSION: 1. No active cardiopulmonary abnormalities. 2. Age indeterminate scapular fracture. Recommend dedicated images of  the right shoulder and scapula.   Electronically Signed   By: Kerby Moors M.D.   On: 02/21/2014 18:48   Dg Scapula Right  02/21/2014   CLINICAL DATA:  Fall from standing position on to right shoulder  EXAM: RIGHT SCAPULA - 2+ VIEWS  COMPARISON:  None.  FINDINGS: No acute fracture or dislocation is noted. The humeral head appears high-riding which may be related to an underlying rotator cuff injury. Additionally  increased sclerosis is noted in the midportion of the scapula. When compare with the recent shoulder film it appears there is a chronic fracture through the base of the scapular spine with nonunion. No other focal abnormality is seen.  IMPRESSION: No acute fracture noted.  There are changes consistent with an old fracture of the scapular spine.   Electronically Signed   By: Inez Catalina M.D.   On: 02/21/2014 19:46   Dg Shoulder Right  02/21/2014   CLINICAL DATA:  Per EMS pt from [facility] hanging wreath on door resulting in fall and deformity to right hip. 150 mcg of fentanyl given en route. Pt also having right shoulder and scapular pain from fall.  EXAM: RIGHT SHOULDER - 2+ VIEW  COMPARISON:  None.  FINDINGS: There is a fracture of the scapular spine which has associated sclerosis. This has the appearance of a subacute or chronic fracture. Fracture may also involve the base of the coracoid process, which is suggested on the Y scapular view.  No other evidence of a fracture. AC joint and glenohumeral joint are normally aligned. There is narrowing of the subacromial space consistent with a chronic full-thickness rotator cuff tear.  Bones are diffusely demineralized.  IMPRESSION: 1. Scapular fracture, which is across the scapular spine. This may also involve the base of the coracoid process. There is sclerosis along the fracture margins of the fracture scapular spine suggesting that this is chronic or subacute. 2. No other fractures. No dislocation. Chronic full-thickness rotator cuff tear.    Electronically Signed   By: Lajean Manes M.D.   On: 02/21/2014 19:41   Dg Hip Complete Right  02/21/2014   CLINICAL DATA:  Fall from standing position on to right hip  EXAM: RIGHT HIP - COMPLETE 2+ VIEW  COMPARISON:  None.  FINDINGS: There are changes consistent with a comminuted intratrochanteric fracture with extension into the proximal femoral shaft. Impaction and angulation is noted at the fracture site. The femoral head is well seated. The pelvic ring is intact. Postsurgical changes are noted in the lumbar spine as well as in the anterior abdominal wall.  IMPRESSION: Comminuted intertrochanteric fracture with extension into the proximal femoral shaft.   Electronically Signed   By: Inez Catalina M.D.   On: 02/21/2014 18:52   Dg Femur Right  02/21/2014   CLINICAL DATA:  Per EMS pt from [facility] hanging wreath on door resulting in fall and deformity to right hip with severe right hip pain.  EXAM: RIGHT FEMUR - 2 VIEW  COMPARISON:  None.  FINDINGS: There is comminuted displaced fracture of the proximal right femur. Fracture extends from the intertrochanteric portion to the proximal shaft. There is a butterfly fragment of the proximal shaft, displaced 19 mm medial to the distal shaft fracture component. There is a separate fracture the lesser trochanter, which is displaced more medially. The distal shaft fracture component is also displaced anteriorly in relation to the proximal shaft fracture component, by 18 mm. There is mild varus angulation between the shaft and femoral neck.  No other fractures. Right hip joint is normally spaced and aligned. Knee joint is normally aligned.  IMPRESSION: Comminuted, displaced fracture of the proximal right femur as described. No dislocation.   Electronically Signed   By: Lajean Manes M.D.   On: 02/21/2014 18:48   Ct Head Wo Contrast  02/21/2014   CLINICAL DATA:  Gait disturbance.  Fall.  Dizziness.  EXAM: CT HEAD WITHOUT CONTRAST  TECHNIQUE: Contiguous  axial  images were obtained from the base of the skull through the vertex without intravenous contrast.  COMPARISON:  09/08/2013  FINDINGS: Low attenuation throughout the subcortical and periventricular white matter identified compatible with small vessel ischemic disease. Left occipital lobe encephalomalacia appears similar to previous exam. There is no evidence for acute brain infarct, hemorrhage or mass. The paranasal sinuses are clear. The mastoid air cells are also clear. The skull is intact.  IMPRESSION: 1. No acute intracranial abnormalities. 2. Advanced, chronic microvascular ischemic disease.   Electronically Signed   By: Kerby Moors M.D.   On: 02/21/2014 18:30  I personally reviewed the imaging tests through PACS system I reviewed available ER/hospitalization records through the EMR    EKG Interpretation   Date/Time:  Monday February 21 2014 17:41:35 EST Ventricular Rate:  80 PR Interval:  111 QRS Duration: 84 QT Interval:  376 QTC Calculation: 434 R Axis:   66 Text Interpretation:  Sinus rhythm Atrial premature complex Borderline  short PR interval Probable anteroseptal infarct, old No significant change  was found Confirmed by Shayon Trompeter  MD, Lennette Bihari ([id number]) on 02/21/2014 7:50:30 PM      MDM   Final diagnoses:  Fall  Head injury  Pain   Patient with right hip and proximal femur fracture.  I spoke with Dr. Mardelle Matte who will plan on operating tomorrow.  Patient will be admitted to [facility].  Hospitalist admission.  Patient's family updated.   Hoy Morn, MD 02/21/14 [phone]

## 2014-02-21 NOTE — Consult Note (Signed)
ORTHOPAEDIC CONSULTATION  REQUESTING PHYSICIAN: Kelvin Cellar, MD  Chief Complaint: right hip pain  HPI: [name] is a 80 y.o. female who complains of  Right hip pain after a mechanical fall today hanging christmas lights.  Acute severe pain, worse with movement, better with rest. She has some memory loss and changes in mental function at baseline compared to years ago according to her son. She is however still fairly independent, lives [facility], uses a cane and occasionally a walker for ambulation. She does have a previous history of what sounds like spine compression fractures and may have a history of osteoporosis.  Past Medical History  Diagnosis Date  . Pain in joint, upper arm   . Disturbance of skin sensation   . Other abnormal clinical finding   . Disturbance of skin sensation   . Unspecified vitamin D deficiency   . Tobacco use disorder   . Chronic airway obstruction, not elsewhere classified   . Hypertension   . GERD (gastroesophageal reflux disease)   . Anemia     iron deficient  . Chronic back pain   . Chronic cystitis   . Hernia   . Depression   . Anxiety   . Essential and other specified forms of tremor 12/11/2012  . Osteoporosis   . Degenerative arthritis   . History of cerebrovascular disease     Moderate level small vessel disease  . Gait disorder   . Dyslipidemia   . Osteoarthritis     right knee injection per Dr Trudie Reed  . Hyperlipidemia     mild  . Peripheral vascular disease   . Hemorrhoids   . Esophageal reflux   . Dyspnea     Dr Chase Caller  . Rheumatoid arthritis     Dr Trudie Reed  . Cystitis, chronic     Dr Gaynelle Arabian  . Tremor     and gait disorder--Dr Jannifer Franklin  . Foot drop, bilateral 12/21/2013   Past Surgical History  Procedure Laterality Date  . Back surgery Bilateral     X2  . Appendectomy    . Cataract extraction Bilateral   . Bladder resuspension procedure    . Tonsillectomy    . Inguinal hernia repair Bilateral     History   Social History  . Marital Status: Widowed    Spouse Name: N/A    Number of Children: 3  . Years of Education: college   Occupational History  . SECRETARY   . Retired     Social History Main Topics  . Smoking status: Never Smoker   . Smokeless tobacco: Never Used  . Alcohol Use: No  . Drug Use: No  . Sexual Activity: None   Other Topics Concern  . None   Social History Narrative   Patient lives at [facility].    Patient is widowed.    Patient has 3 children.    Patient is right handed.    Patient has 2 years of Business school.    Family History  Problem Relation Age of Onset  . Heart Problems Mother   . Diabetes Father   . Heart attack Father   . Cancer Sister     Brain tumor  . Emphysema Brother    Allergies  Allergen Reactions  . Novocain [Procaine]     Passed  out  . Limbrel [Flavocoxid]     Dizziness   . Sertraline Anxiety   Prior to Admission medications   Medication Sig Start Date End  Date Taking? Authorizing Provider  acetaminophen (TYLENOL) 650 MG CR tablet Take 650 mg by mouth 2 (two) times daily.   Yes Historical Provider, MD  Calcium Carbonate-Vitamin D (CALTRATE 600+D) 600-400 MG-UNIT per tablet Take 1 tablet by mouth daily.   Yes Historical Provider, MD  cholecalciferol (VITAMIN D) 1000 UNITS tablet Take 1,000 Units by mouth daily.   Yes Historical Provider, MD  diclofenac sodium (VOLTAREN) 1 % GEL Apply 4 g topically 4 (four) times daily.   Yes Historical Provider, MD  fish oil-omega-3 fatty acids 1000 MG capsule Take 2 g by mouth daily.   Yes Historical Provider, MD  hydrochlorothiazide (MICROZIDE) 12.5 MG capsule Take 12.5 mg by mouth daily.   Yes Historical Provider, MD  HYDROcodone-acetaminophen (NORCO/VICODIN) 5-325 MG per tablet Take 1 tablet by mouth every 12 (twelve) hours as needed for pain.   Yes Historical Provider, MD  Misc Natural Products (OSTEO BI-FLEX JOINT SHIELD) TABS Take 1 tablet by mouth daily.   Yes  Historical Provider, MD  Multiple Vitamin (MULTIVITAMIN) tablet Take 1 tablet by mouth daily.   Yes Historical Provider, MD   Dg Chest 1 View  02/21/2014   CLINICAL DATA:  Fall.  Deformity to right hip.  EXAM: CHEST - 1 VIEW  COMPARISON:  03/27/2010  FINDINGS: Normal heart size. No pleural effusion or edema. Lung volumes are low. The patient is status post hardware fusion of the lower thoracic and lumbar spine. There is a an age indeterminate deformity involving the scapula. This is a new abnormality when compared with previous exams.  IMPRESSION: 1. No active cardiopulmonary abnormalities. 2. Age indeterminate scapular fracture. Recommend dedicated images of the right shoulder and scapula.   Electronically Signed   By: Kerby Moors M.D.   On: 02/21/2014 18:48   Dg Scapula Right  02/21/2014   CLINICAL DATA:  Fall from standing position on to right shoulder  EXAM: RIGHT SCAPULA - 2+ VIEWS  COMPARISON:  None.  FINDINGS: No acute fracture or dislocation is noted. The humeral head appears high-riding which may be related to an underlying rotator cuff injury. Additionally increased sclerosis is noted in the midportion of the scapula. When compare with the recent shoulder film it appears there is a chronic fracture through the base of the scapular spine with nonunion. No other focal abnormality is seen.  IMPRESSION: No acute fracture noted.  There are changes consistent with an old fracture of the scapular spine.   Electronically Signed   By: Inez Catalina M.D.   On: 02/21/2014 19:46   Dg Shoulder Right  02/21/2014   CLINICAL DATA:  Per EMS pt from [facility] hanging wreath on door resulting in fall and deformity to right hip. 150 mcg of fentanyl given en route. Pt also having right shoulder and scapular pain from fall.  EXAM: RIGHT SHOULDER - 2+ VIEW  COMPARISON:  None.  FINDINGS: There is a fracture of the scapular spine which has associated sclerosis. This has the appearance of a subacute or chronic  fracture. Fracture may also involve the base of the coracoid process, which is suggested on the Y scapular view.  No other evidence of a fracture. AC joint and glenohumeral joint are normally aligned. There is narrowing of the subacromial space consistent with a chronic full-thickness rotator cuff tear.  Bones are diffusely demineralized.  IMPRESSION: 1. Scapular fracture, which is across the scapular spine. This may also involve the base of the coracoid process. There is sclerosis along the fracture margins  of the fracture scapular spine suggesting that this is chronic or subacute. 2. No other fractures. No dislocation. Chronic full-thickness rotator cuff tear.   Electronically Signed   By: Lajean Manes M.D.   On: 02/21/2014 19:41   Dg Hip Complete Right  02/21/2014   CLINICAL DATA:  Fall from standing position on to right hip  EXAM: RIGHT HIP - COMPLETE 2+ VIEW  COMPARISON:  None.  FINDINGS: There are changes consistent with a comminuted intratrochanteric fracture with extension into the proximal femoral shaft. Impaction and angulation is noted at the fracture site. The femoral head is well seated. The pelvic ring is intact. Postsurgical changes are noted in the lumbar spine as well as in the anterior abdominal wall.  IMPRESSION: Comminuted intertrochanteric fracture with extension into the proximal femoral shaft.   Electronically Signed   By: Inez Catalina M.D.   On: 02/21/2014 18:52   Dg Femur Right  02/21/2014   CLINICAL DATA:  Per EMS pt from [facility] hanging wreath on door resulting in fall and deformity to right hip with severe right hip pain.  EXAM: RIGHT FEMUR - 2 VIEW  COMPARISON:  None.  FINDINGS: There is comminuted displaced fracture of the proximal right femur. Fracture extends from the intertrochanteric portion to the proximal shaft. There is a butterfly fragment of the proximal shaft, displaced 19 mm medial to the distal shaft fracture component. There is a separate fracture the  lesser trochanter, which is displaced more medially. The distal shaft fracture component is also displaced anteriorly in relation to the proximal shaft fracture component, by 18 mm. There is mild varus angulation between the shaft and femoral neck.  No other fractures. Right hip joint is normally spaced and aligned. Knee joint is normally aligned.  IMPRESSION: Comminuted, displaced fracture of the proximal right femur as described. No dislocation.   Electronically Signed   By: Lajean Manes M.D.   On: 02/21/2014 18:48   Ct Head Wo Contrast  02/21/2014   CLINICAL DATA:  Gait disturbance.  Fall.  Dizziness.  EXAM: CT HEAD WITHOUT CONTRAST  TECHNIQUE: Contiguous axial images were obtained from the base of the skull through the vertex without intravenous contrast.  COMPARISON:  09/08/2013  FINDINGS: Low attenuation throughout the subcortical and periventricular white matter identified compatible with small vessel ischemic disease. Left occipital lobe encephalomalacia appears similar to previous exam. There is no evidence for acute brain infarct, hemorrhage or mass. The paranasal sinuses are clear. The mastoid air cells are also clear. The skull is intact.  IMPRESSION: 1. No acute intracranial abnormalities. 2. Advanced, chronic microvascular ischemic disease.   Electronically Signed   By: Kerby Moors M.D.   On: 02/21/2014 18:30    Positive ROS: All other systems have been reviewed and were otherwise negative with the exception of those mentioned in the HPI and as above.  Physical Exam: General: Alert, no acute distress, she does have some repetitive questioning, and mild confusion. Cardiovascular: Trace pedal edema Respiratory: No cyanosis, no use of accessory musculature GI: No organomegaly, abdomen is soft and non-tender Skin: No lesions in the area of chief complaint with the exception of bruising Neurologic: Sensation intact distally Psychiatric: Patient interacts normally with me, her son is in  the room as well, she seems to understand the implications that I have discussed as far as surgery goes, and seems reasonable in ability to provide consent. Lymphatic: No axillary or cervical lymphadenopathy  MUSCULOSKELETAL: Right hip is shortened and externally rotated.  EHL and FHL are firing. I noted the foot drop in her history, however she does seem to be able to dorsiflex her toes. Positive logroll and I did not move her leg.  Right shoulder has full motion, without any significant pain, no pain to palpation over the scapula.  Assessment: Displaced right subtrochanteric femur fracture with coexisting medical comorbidities as listed above, and probable osteoporosis, and history of recent falls with reported footdrop and weakness.  Plan: This is an acute severe injury, and is comfortable threaten her long-term independence and function. I recommended surgical intervention for intramedullary nail fixation.  The risks benefits and alternatives were discussed with the patient including but not limited to the risks of nonoperative treatment, versus surgical intervention including infection, bleeding, nerve injury, malunion, nonunion, the need for revision surgery, hardware prominence, hardware failure, the need for hardware removal, blood clots, cardiopulmonary complications, morbidity, mortality, among others, and they were willing to proceed.    Plan for OR tomorrow depending on OR availability.      Johnny Bridge, MD Cell [phone]   02/21/2014 9:06 PM

## 2014-02-21 NOTE — ED Notes (Addendum)
Per EMS pt from [facility] hanging wreath on door resulting in fall and deformity to right hip. 150 mcg of fentanyl given en route.

## 2014-02-21 NOTE — ED Notes (Signed)
Bed: DQ55 Expected date:  Expected time:  Means of arrival:  Comments: EMS- fall, hip deformity

## 2014-02-22 ENCOUNTER — Inpatient Hospital Stay (HOSPITAL_COMMUNITY): Payer: Medicare Other | Admitting: Certified Registered Nurse Anesthetist

## 2014-02-22 ENCOUNTER — Encounter (HOSPITAL_COMMUNITY): Payer: Self-pay | Admitting: General Practice

## 2014-02-22 ENCOUNTER — Inpatient Hospital Stay (HOSPITAL_COMMUNITY): Payer: Medicare Other

## 2014-02-22 ENCOUNTER — Encounter (HOSPITAL_COMMUNITY)
Admission: EM | Disposition: A | Discharge status: Skilled Nursing Facility | Payer: Self-pay | Source: Home / Self Care | Attending: Internal Medicine

## 2014-02-22 HISTORY — PX: FEMUR IM NAIL: SHX1597

## 2014-02-22 LAB — URINE MICROSCOPIC-ADD ON

## 2014-02-22 LAB — CBC
HCT: 33.9 % — ABNORMAL LOW (ref 36.0–46.0)
HEMOGLOBIN: 11.1 g/dL — AB (ref 12.0–15.0)
MCH: 34 pg (ref 26.0–34.0)
MCHC: 32.7 g/dL (ref 30.0–36.0)
MCV: 104 fL — ABNORMAL HIGH (ref 78.0–100.0)
Platelets: 195 10*3/uL (ref 150–400)
RBC: 3.26 MIL/uL — ABNORMAL LOW (ref 3.87–5.11)
RDW: 13.3 % (ref 11.5–15.5)
WBC: 10.3 10*3/uL (ref 4.0–10.5)

## 2014-02-22 LAB — URINALYSIS, ROUTINE W REFLEX MICROSCOPIC
BILIRUBIN URINE: NEGATIVE
GLUCOSE, UA: NEGATIVE mg/dL
Ketones, ur: 15 mg/dL — AB
Nitrite: POSITIVE — AB
Protein, ur: NEGATIVE mg/dL
SPECIFIC GRAVITY, URINE: 1.024 (ref 1.005–1.030)
Urobilinogen, UA: 0.2 mg/dL (ref 0.0–1.0)
pH: 5.5 (ref 5.0–8.0)

## 2014-02-22 LAB — BASIC METABOLIC PANEL
ANION GAP: 10 (ref 5–15)
BUN: 23 mg/dL (ref 6–23)
CHLORIDE: 105 meq/L (ref 96–112)
CO2: 29 meq/L (ref 19–32)
CREATININE: 0.85 mg/dL (ref 0.50–1.10)
Calcium: 9.3 mg/dL (ref 8.4–10.5)
GFR calc Af Amer: 69 mL/min — ABNORMAL LOW (ref >90)
GFR calc non Af Amer: 60 mL/min — ABNORMAL LOW (ref >90)
GLUCOSE: 182 mg/dL — AB (ref 70–99)
Potassium: 4.1 mEq/L (ref 3.7–5.3)
Sodium: 144 mEq/L (ref 137–147)

## 2014-02-22 LAB — SURGICAL PCR SCREEN
MRSA, PCR: NEGATIVE
Staphylococcus aureus: NEGATIVE

## 2014-02-22 LAB — ABO/RH: ABO/RH(D): A POS

## 2014-02-22 SURGERY — INSERTION, INTRAMEDULLARY ROD, FEMUR
Anesthesia: General | Laterality: Right

## 2014-02-22 MED ORDER — HYDROCODONE-ACETAMINOPHEN 5-325 MG PO TABS: 1.0000 | ORAL_TABLET | Freq: Four times a day (QID) | ORAL | Status: DC | PRN

## 2014-02-22 MED ORDER — PROPOFOL 10 MG/ML IV BOLUS
INTRAVENOUS | Status: DC | PRN
  Administered 2014-02-22: 90 mg via INTRAVENOUS

## 2014-02-22 MED ORDER — SENNA 8.6 MG PO TABS
1.0000 | ORAL_TABLET | Freq: Two times a day (BID) | ORAL | Status: DC
  Administered 2014-02-22 – 2014-02-24 (×4): 8.6 mg via ORAL
  Filled 2014-02-22 (×5): qty 1

## 2014-02-22 MED ORDER — ROCURONIUM BROMIDE 100 MG/10ML IV SOLN
INTRAVENOUS | Status: DC | PRN
  Administered 2014-02-22: 30 mg via INTRAVENOUS

## 2014-02-22 MED ORDER — MORPHINE SULFATE 2 MG/ML IJ SOLN
0.5000 mg | INTRAMUSCULAR | Status: DC | PRN
  Administered 2014-02-22: 0.5 mg via INTRAVENOUS
  Filled 2014-02-22: qty 1

## 2014-02-22 MED ORDER — GLYCOPYRROLATE 0.2 MG/ML IJ SOLN
INTRAMUSCULAR | Status: DC | PRN
  Administered 2014-02-22: 0.4 mg via INTRAVENOUS

## 2014-02-22 MED ORDER — KCL IN DEXTROSE-NACL 20-5-0.45 MEQ/L-%-% IV SOLN
INTRAVENOUS | Status: AC
  Administered 2014-02-22: 1000 mL
  Filled 2014-02-22: qty 1000

## 2014-02-22 MED ORDER — PHENYLEPHRINE HCL 10 MG/ML IJ SOLN
INTRAMUSCULAR | Status: DC | PRN
Start: 2014-02-22 — End: 2014-02-22
  Administered 2014-02-22: 120 ug via INTRAVENOUS

## 2014-02-22 MED ORDER — POTASSIUM CHLORIDE IN NACL 20-0.45 MEQ/L-% IV SOLN
INTRAVENOUS | Status: DC
  Administered 2014-02-22: 22:00:00 via INTRAVENOUS
  Filled 2014-02-22 (×4): qty 1000

## 2014-02-22 MED ORDER — POLYETHYLENE GLYCOL 3350 17 G PO PACK: 17.0000 g | PACK | Freq: Every day | ORAL | Status: DC | PRN

## 2014-02-22 MED ORDER — PHENYLEPHRINE HCL 10 MG/ML IJ SOLN
INTRAMUSCULAR | Status: AC
  Filled 2014-02-22: qty 1

## 2014-02-22 MED ORDER — METOCLOPRAMIDE HCL 5 MG/ML IJ SOLN: 5.0000 mg | Freq: Three times a day (TID) | INTRAMUSCULAR | Status: DC | PRN

## 2014-02-22 MED ORDER — ACETAMINOPHEN 650 MG RE SUPP: 650.0000 mg | Freq: Four times a day (QID) | RECTAL | Status: DC | PRN

## 2014-02-22 MED ORDER — ACETAMINOPHEN 325 MG PO TABS
650.0000 mg | ORAL_TABLET | Freq: Four times a day (QID) | ORAL | Status: DC | PRN
Start: 2014-02-22 — End: 2014-02-24
  Filled 2014-02-22: qty 2

## 2014-02-22 MED ORDER — CEFAZOLIN SODIUM-DEXTROSE 2-3 GM-% IV SOLR
2.0000 g | INTRAVENOUS | Status: AC
  Administered 2014-02-22: 2 g via INTRAVENOUS
  Filled 2014-02-22 (×2): qty 50

## 2014-02-22 MED ORDER — 0.9 % SODIUM CHLORIDE (POUR BTL) OPTIME
TOPICAL | Status: DC | PRN
  Administered 2014-02-22: 1000 mL

## 2014-02-22 MED ORDER — SENNA-DOCUSATE SODIUM 8.6-50 MG PO TABS: 2.0000 | ORAL_TABLET | Freq: Every day | ORAL | Status: AC

## 2014-02-22 MED ORDER — HYDROMORPHONE HCL 1 MG/ML IJ SOLN
INTRAMUSCULAR | Status: AC
  Filled 2014-02-22: qty 1

## 2014-02-22 MED ORDER — ENOXAPARIN SODIUM 300 MG/3ML IJ SOLN: 30.0000 mg | Freq: Every day | INTRAMUSCULAR | Status: DC

## 2014-02-22 MED ORDER — NEOSTIGMINE METHYLSULFATE 10 MG/10ML IV SOLN
INTRAVENOUS | Status: DC | PRN
  Administered 2014-02-22: 3 mg via INTRAVENOUS

## 2014-02-22 MED ORDER — LIDOCAINE HCL (CARDIAC) 20 MG/ML IV SOLN
INTRAVENOUS | Status: DC | PRN
  Administered 2014-02-22: 100 mg via INTRAVENOUS

## 2014-02-22 MED ORDER — ONDANSETRON HCL 4 MG/2ML IJ SOLN
INTRAMUSCULAR | Status: DC | PRN
  Administered 2014-02-22: 4 mg via INTRAVENOUS

## 2014-02-22 MED ORDER — FENTANYL CITRATE 0.05 MG/ML IJ SOLN
INTRAMUSCULAR | Status: AC
  Filled 2014-02-22: qty 5

## 2014-02-22 MED ORDER — MENTHOL 3 MG MT LOZG: 1.0000 | LOZENGE | OROMUCOSAL | Status: DC | PRN

## 2014-02-22 MED ORDER — DOCUSATE SODIUM 100 MG PO CAPS
100.0000 mg | ORAL_CAPSULE | Freq: Two times a day (BID) | ORAL | Status: DC
  Administered 2014-02-22 – 2014-02-24 (×4): 100 mg via ORAL
  Filled 2014-02-22 (×4): qty 1

## 2014-02-22 MED ORDER — HYDROMORPHONE HCL 1 MG/ML IJ SOLN
0.2500 mg | INTRAMUSCULAR | Status: DC | PRN
  Administered 2014-02-22 (×3): 0.25 mg via INTRAVENOUS

## 2014-02-22 MED ORDER — ALUM & MAG HYDROXIDE-SIMETH 200-200-20 MG/5ML PO SUSP: 30.0000 mL | ORAL | Status: DC | PRN

## 2014-02-22 MED ORDER — BISACODYL 10 MG RE SUPP: 10.0000 mg | Freq: Every day | RECTAL | Status: DC | PRN

## 2014-02-22 MED ORDER — MAGNESIUM CITRATE PO SOLN: 1.0000 | Freq: Once | ORAL | Status: AC | PRN

## 2014-02-22 MED ORDER — HYDROCODONE-ACETAMINOPHEN 5-325 MG PO TABS
1.0000 | ORAL_TABLET | Freq: Four times a day (QID) | ORAL | Status: DC | PRN
  Administered 2014-02-23 – 2014-02-24 (×4): 1 via ORAL
  Filled 2014-02-22 (×4): qty 1

## 2014-02-22 MED ORDER — ENOXAPARIN SODIUM 40 MG/0.4ML ~~LOC~~ SOLN
40.0000 mg | SUBCUTANEOUS | Status: DC
  Administered 2014-02-23 – 2014-02-24 (×2): 40 mg via SUBCUTANEOUS
  Filled 2014-02-22 (×3): qty 0.4

## 2014-02-22 MED ORDER — FENTANYL CITRATE 0.05 MG/ML IJ SOLN
INTRAMUSCULAR | Status: DC | PRN
  Administered 2014-02-22: 150 ug via INTRAVENOUS
  Administered 2014-02-22: 25 ug via INTRAVENOUS
  Administered 2014-02-22: 50 ug via INTRAVENOUS
  Administered 2014-02-22: 25 ug via INTRAVENOUS

## 2014-02-22 MED ORDER — PHENOL 1.4 % MT LIQD: 1.0000 | OROMUCOSAL | Status: DC | PRN

## 2014-02-22 MED ORDER — ONDANSETRON HCL 4 MG PO TABS: 4.0000 mg | ORAL_TABLET | Freq: Four times a day (QID) | ORAL | Status: DC | PRN

## 2014-02-22 MED ORDER — METOCLOPRAMIDE HCL 10 MG PO TABS: 5.0000 mg | ORAL_TABLET | Freq: Three times a day (TID) | ORAL | Status: DC | PRN

## 2014-02-22 MED ORDER — ONDANSETRON HCL 4 MG/2ML IJ SOLN: 4.0000 mg | Freq: Four times a day (QID) | INTRAMUSCULAR | Status: DC | PRN

## 2014-02-22 MED ORDER — LACTATED RINGERS IV SOLN
INTRAVENOUS | Status: DC | PRN
  Administered 2014-02-22 (×2): via INTRAVENOUS

## 2014-02-22 MED ORDER — CEFAZOLIN SODIUM-DEXTROSE 2-3 GM-% IV SOLR
2.0000 g | Freq: Four times a day (QID) | INTRAVENOUS | Status: AC
  Administered 2014-02-22 – 2014-02-23 (×2): 2 g via INTRAVENOUS
  Filled 2014-02-22 (×2): qty 50

## 2014-02-22 MED FILL — Hydromorphone HCl Inj 2 MG/ML: INTRAMUSCULAR | Qty: 1 | Status: AC

## 2014-02-22 MED FILL — Ondansetron HCl Inj 4 MG/2ML (2 MG/ML): INTRAMUSCULAR | Qty: 2 | Status: AC

## 2014-02-22 SURGICAL SUPPLY — 48 items
BENZOIN TINCTURE PRP APPL 2/3 (GAUZE/BANDAGES/DRESSINGS) ×3 IMPLANT
BIT DRILL CANN 16 HIP (BIT) ×2 IMPLANT
BIT DRILL CANN 16MM HIP (BIT) ×1
BIT DRILL SHORT 4.2 (BIT) ×1 IMPLANT
BLADE HELICAL TI 85 STRL (Anchor) ×2 IMPLANT
BLADE HELICAL TI 85MM STRL (Anchor) ×1 IMPLANT
BOOTCOVER CLEANROOM LRG (PROTECTIVE WEAR) ×6 IMPLANT
CLOSURE STERI-STRIP 1/2X4 (GAUZE/BANDAGES/DRESSINGS) ×1
CLSR STERI-STRIP ANTIMIC 1/2X4 (GAUZE/BANDAGES/DRESSINGS) ×2 IMPLANT
COVER PERINEAL POST (MISCELLANEOUS) ×3 IMPLANT
COVER SURGICAL LIGHT HANDLE (MISCELLANEOUS) ×3 IMPLANT
DRAPE INCISE IOBAN 66X45 STRL (DRAPES) ×3 IMPLANT
DRAPE STERI IOBAN 125X83 (DRAPES) ×3 IMPLANT
DRILL BIT SHORT 4.2 (BIT) ×2
DRSG MEPILEX BORDER 4X4 (GAUZE/BANDAGES/DRESSINGS) ×6 IMPLANT
DRSG MEPILEX BORDER 4X8 (GAUZE/BANDAGES/DRESSINGS) ×3 IMPLANT
DURAPREP 26ML APPLICATOR (WOUND CARE) ×3 IMPLANT
ELECT CAUTERY BLADE 6.4 (BLADE) ×3 IMPLANT
ELECT REM PT RETURN 9FT ADLT (ELECTROSURGICAL) ×3
ELECTRODE REM PT RTRN 9FT ADLT (ELECTROSURGICAL) ×1 IMPLANT
EVACUATOR 1/8 PVC DRAIN (DRAIN) IMPLANT
GAUZE XEROFORM 5X9 LF (GAUZE/BANDAGES/DRESSINGS) ×3 IMPLANT
GLOVE BIOGEL PI ORTHO PRO SZ8 (GLOVE) ×4
GLOVE ORTHO TXT STRL SZ7.5 (GLOVE) ×6 IMPLANT
GLOVE PI ORTHO PRO STRL SZ8 (GLOVE) ×2 IMPLANT
GLOVE SURG ORTHO 8.0 STRL STRW (GLOVE) ×6 IMPLANT
GOWN STRL REUS W/ TWL XL LVL3 (GOWN DISPOSABLE) ×1 IMPLANT
GOWN STRL REUS W/TWL 2XL LVL3 (GOWN DISPOSABLE) IMPLANT
GOWN STRL REUS W/TWL XL LVL3 (GOWN DISPOSABLE) ×2
KIT ROOM TURNOVER OR (KITS) ×3 IMPLANT
LINER BOOT UNIVERSAL DISP (MISCELLANEOUS) ×3 IMPLANT
MANIFOLD NEPTUNE II (INSTRUMENTS) ×3 IMPLANT
NAIL TI CANN 11MM 130D HIP (Nail) ×3 IMPLANT
NS IRRIG 1000ML POUR BTL (IV SOLUTION) ×3 IMPLANT
PACK GENERAL/GYN (CUSTOM PROCEDURE TRAY) ×3 IMPLANT
PAD ARMBOARD 7.5X6 YLW CONV (MISCELLANEOUS) ×6 IMPLANT
REAMER ROD DEEP FLUTE 2.5X950 (INSTRUMENTS) ×3 IMPLANT
SCREW LOCK STAR 5X34 (Screw) ×3 IMPLANT
SCREW LOCK STAR 5X36 (Screw) ×3 IMPLANT
STAPLER VISISTAT 35W (STAPLE) ×3 IMPLANT
SUT VIC AB 0 CTB1 27 (SUTURE) ×3 IMPLANT
SUT VIC AB 2-0 FS1 27 (SUTURE) ×3 IMPLANT
SUT VIC AB 2-0 SH 27 (SUTURE)
SUT VIC AB 2-0 SH 27XBRD (SUTURE) IMPLANT
SUT VIC AB 3-0 SH 8-18 (SUTURE) ×3 IMPLANT
TOWEL OR 17X24 6PK STRL BLUE (TOWEL DISPOSABLE) ×3 IMPLANT
TOWEL OR 17X26 10 PK STRL BLUE (TOWEL DISPOSABLE) ×3 IMPLANT
WATER STERILE IRR 1000ML POUR (IV SOLUTION) ×3 IMPLANT

## 2014-02-22 NOTE — Plan of Care (Signed)
Problem: Discharge Progression Outcomes Goal: Pain controlled with appropriate interventions Outcome: Completed/Met Date Met:  02/22/14

## 2014-02-22 NOTE — Anesthesia Procedure Notes (Signed)
Procedure Name: Intubation Date/Time: 02/22/2014 6:51 PM Performed by: Manuela Schwartz B Pre-anesthesia Checklist: Patient identified, Emergency Drugs available, Suction available, Patient being monitored and Timeout performed Patient Re-evaluated:Patient Re-evaluated prior to inductionOxygen Delivery Method: Circle system utilized Preoxygenation: Pre-oxygenation with 100% oxygen Intubation Type: IV induction Ventilation: Mask ventilation without difficulty Laryngoscope Size: Mac and 3 Grade View: Grade I Tube type: Oral Tube size: 7.5 mm Number of attempts: 1 Airway Equipment and Method: Stylet Placement Confirmation: ETT inserted through vocal cords under direct vision,  positive ETCO2 and breath sounds checked- equal and bilateral Secured at: 20 cm Tube secured with: Tape Dental Injury: Teeth and Oropharynx as per pre-operative assessment

## 2014-02-22 NOTE — Anesthesia Postprocedure Evaluation (Signed)
Anesthesia Post Note  Patient: [name]  Procedure(s) Performed: Procedure(s) (LRB): INTRAMEDULLARY (IM) NAIL FEMORAL (Right)  Anesthesia type: general  Patient location: PACU  Post pain: Pain level controlled  Post assessment: Patient's Cardiovascular Status Stable  Last Vitals:  Filed Vitals:   02/22/14 2214  BP: 157/64  Pulse: 106  Temp: 36.4 C  Resp: 22    Post vital signs: Reviewed and stable  Level of consciousness: sedated  Complications: No apparent anesthesia complications

## 2014-02-22 NOTE — Anesthesia Preprocedure Evaluation (Addendum)
Anesthesia Evaluation  Patient identified by MRN, date of birth, ID band Patient awake    Reviewed: Allergy & Precautions, H&P , NPO status , Patient's Chart, lab work & pertinent test results  Airway Mallampati: II  TM Distance: >3 FB Neck ROM: Full    Dental  (+) Dental Advisory Given, Teeth Intact,    Pulmonary shortness of breath and with exertion, COPD         Cardiovascular hypertension, Pt. on medications + Peripheral Vascular Disease     Neuro/Psych PSYCHIATRIC DISORDERS Anxiety Depression    GI/Hepatic GERD-  Medicated,  Endo/Other    Renal/GU      Musculoskeletal  (+) Arthritis -, Osteoarthritis,    Abdominal   Peds  Hematology   Anesthesia Other Findings   Reproductive/Obstetrics                            Anesthesia Physical Anesthesia Plan  ASA: III  Anesthesia Plan: General   Post-op Pain Management:    Induction: Intravenous  Airway Management Planned: Oral ETT  Additional Equipment:   Intra-op Plan:   Post-operative Plan: Extubation in OR  Informed Consent: I have reviewed the patients History and Physical, chart, labs and discussed the procedure including the risks, benefits and alternatives for the proposed anesthesia with the patient or authorized representative who has indicated his/her understanding and acceptance.   Dental advisory given  Plan Discussed with: CRNA, Anesthesiologist and Surgeon  Anesthesia Plan Comments:         Anesthesia Quick Evaluation

## 2014-02-22 NOTE — Progress Notes (Signed)
Care link arrived with patient from [facility].

## 2014-02-22 NOTE — Progress Notes (Signed)
Pt transported to Short Stay at 1800. Paged Short stay X3, no response.

## 2014-02-22 NOTE — Transfer of Care (Signed)
Immediate Anesthesia Transfer of Care Note  Patient: [name]  Procedure(s) Performed: Procedure(s): INTRAMEDULLARY (IM) NAIL FEMORAL (Right)  Patient Location: PACU  Anesthesia Type:General  Level of Consciousness: awake, alert  and oriented  Airway & Oxygen Therapy: Patient Spontanous Breathing and Patient connected to nasal cannula oxygen  Post-op Assessment: Report given to PACU RN and Post -op Vital signs reviewed and stable  Post vital signs: Reviewed and stable  Complications: No apparent anesthesia complications

## 2014-02-22 NOTE — Op Note (Signed)
DATE OF SURGERY:  02/22/2014  TIME: 9:02 PM  PATIENT NAME:  [name]  AGE: 80 y.o.  PRE-OPERATIVE DIAGNOSIS:  RIGHT SUBTROCHANTERIC FEMUR FRACTURE  POST-OPERATIVE DIAGNOSIS:  SAME  PROCEDURE:  INTRAMEDULLARY (IM) NAIL FEMORAL  SURGEON:  Krimson Massmann P  ASSISTANT:  Joya Gaskins, OPA-C, present and scrubbed throughout the case, critical for assistance with exposure, retraction, instrumentation, and closure.  OPERATIVE IMPLANTS: Synthes trochanteric femoral nail with interlocking helical blade into the femoral head and two distal interlocking bolts  PREOPERATIVE INDICATIONS:  [name] is a 80 y.o. year old who fell and suffered a hip fracture. She was brought into the ER and then admitted and optimized and then elected for surgical intervention.    The risks benefits and alternatives were discussed with the patient including but not limited to the risks of nonoperative treatment, versus surgical intervention including infection, bleeding, nerve injury, malunion, nonunion, hardware prominence, hardware failure, need for hardware removal, blood clots, cardiopulmonary complications, morbidity, mortality, among others, and they were willing to proceed.    OPERATIVE PROCEDURE:  The patient was brought to the operating room and placed in the supine position. General anesthesia was administered, with a foley. She was placed on the fracture table.  Closed reduction was performed under C-arm guidance. The length of the femur was also measured using fluoroscopy. Time out was then performed after sterile prep and drape. She received preoperative antibiotics.  Incision was made over the fracture site because psatisfactory reduction was not achievable under closed means. There was a very large butterfly fragment, as well as a proximal comminuted subtrochanteric segment, and the butterfly fragment really represented a fairly large portion of the "tube".   After applying 2 clamps, across  each of the major fracture planes, and holding the fracture reduced, incision was made proximal to the greater trochanter. A guidewire was placed in the appropriate position. Confirmation was made on AP and lateral views. The above-named nail was opened. I opened the proximal femur with a reamer. I then had to ream the canal, because it was extremely tight, reaming up to a size 12.5 to accommodate an 11 nail.  Once the nail was completely seated, I placed a guidepin into the femoral head into the center center position. I measured the length, and then reamed the lateral cortex and up into the head. I then placed the helical blade. Slight compression was applied. Anatomic fixation achieved. Bone quality was mediocre.  I then secured the proximal interlocking bolt, and took off a half a turn, and then removed the instruments, and I locked the distal nail with 2 interlocking bolts using perfect circles technique. Then I took final C-arm pictures AP and lateral the entire length of the leg. Anatomic reconstruction was achieved, and the wounds were irrigated copiously and closed with Vicryl followed by Steri-Strips and sterile gauze for the skin. The patient was awakened and returned to PACU in stable and satisfactory condition. There no complications and the patient tolerated the procedure well.  She will be weightbearing as tolerated, and will be on Lovenox  for a period of three weeks after discharge.   Marchia Bond, M.D.

## 2014-02-22 NOTE — Progress Notes (Signed)
The patient has been re-examined, and the chart reviewed, and there have been no interval changes to the documented history and physical.    The risks, benefits, and alternatives have been discussed at length, and the patient is willing to proceed.    Santino Kinsella P, MD  

## 2014-02-22 NOTE — Progress Notes (Signed)
Patient IV came out. Nurse tried to put in a new IV but was unsuccessful. Skin is thin and fragile.

## 2014-02-22 NOTE — Progress Notes (Signed)
Utilization Review Completed.[name] T12/10/2013  

## 2014-02-22 NOTE — Discharge Instructions (Signed)
Diet: As you were doing prior to hospitalization  ° °Shower:  May shower but keep the wounds dry, use an occlusive plastic wrap, NO SOAKING IN TUB.  If the bandage gets wet, change with a clean dry gauze. ° °Dressing:  You may change your dressing 3-5 days after surgery.  Then change the dressing daily with sterile gauze dressing.   ° °There are sticky tapes (steri-strips) on your wounds and all the stitches are absorbable.  Leave the steri-strips in place when changing your dressings, they will peel off with time, usually 2-3 weeks. ° °Activity:  Increase activity slowly as tolerated, but follow the weight bearing instructions below.  No lifting or driving for 6 weeks. ° °Weight Bearing:   As tolerated.   ° °To prevent constipation: you may use a stool softener such as - ° °Colace (over the counter) 100 mg by mouth twice a day  °Drink plenty of fluids (prune juice may be helpful) and high fiber foods °Miralax (over the counter) for constipation as needed.   ° °Itching:  If you experience itching with your medications, try taking only a single pain pill, or even half a pain pill at a time.  You may take up to 10 pain pills per day, and you can also use benadryl over the counter for itching or also to help with sleep.  ° °Precautions:  If you experience chest pain or shortness of breath - call 911 immediately for transfer to the hospital emergency department!! ° °If you develop a fever greater that 101 F, purulent drainage from wound, increased redness or drainage from wound, or calf pain -- Call the office at [phone]                                                °Follow- Up Appointment:  Please call for an appointment to be seen in 2 weeks [place] - [phone] ° ° ° ° ° °

## 2014-02-22 NOTE — Progress Notes (Signed)
Chart reviewed   [facility] PROGRESS NOTE  PAISLEIGH MARONEY YYT:[id number] DOB: May 18, 1944 DOA: 02/21/2014 PCP: Gerrit Heck, MD  Assessment/Plan:  Principal Problem:   Fracture, intertrochanteric, right femur Active Problems:   Fall at home   Depression  Awaiting surgery. stable  HPI/Subjective: Pain controlled  Objective: Filed Vitals:   02/22/14 0539  BP: 111/60  Pulse: 92  Temp: 98 F (36.7 C)  Resp: 15    Intake/Output Summary (Last 24 hours) at 02/22/14 1623 Last data filed at 02/22/14 1300  Gross per 24 hour  Intake    300 ml  Output    150 ml  Net    150 ml   There were no vitals filed for this visit.  Exam:   General:  Alert, oriented, comfortable  Cardiovascular: RRR without MGR  Respiratory: CTA without WRR  Abdomen: S, NT, ND  Ext: no edema  Basic Metabolic Panel:  Recent Labs Lab 02/21/14 1734 02/22/14 0525  NA 141 144  K 3.8 4.1  CL 101 105  CO2 29 29  GLUCOSE 128* 182*  BUN 22 23  CREATININE 0.95 0.85  CALCIUM 9.6 9.3   Liver Function Tests: No results for input(s): AST, ALT, ALKPHOS, BILITOT, PROT, ALBUMIN in the last 168 hours. No results for input(s): LIPASE, AMYLASE in the last 168 hours. No results for input(s): AMMONIA in the last 168 hours. CBC:  Recent Labs Lab 02/21/14 1734 02/22/14 0525  WBC 7.6 10.3  NEUTROABS 5.6  --   HGB 14.0 11.1*  HCT 42.9 33.9*  MCV 102.9* 104.0*  PLT 209 195   Cardiac Enzymes: No results for input(s): CKTOTAL, CKMB, CKMBINDEX, TROPONINI in the last 168 hours. BNP (last 3 results) No results for input(s): PROBNP in the last 8760 hours. CBG: No results for input(s): GLUCAP in the last 168 hours.  Recent Results (from the past 240 hour(s))  Surgical pcr screen     Status: None   Collection Time: 02/22/14 12:40 AM  Result Value Ref Range Status   MRSA, PCR NEGATIVE NEGATIVE Final   Staphylococcus aureus NEGATIVE NEGATIVE Final    Comment:        The Xpert SA  Assay (FDA approved for NASAL specimens in patients over 8 years of age), is one component of a comprehensive surveillance program.  Test performance has been validated by [employer] for patients greater than or equal to 11 year old. It is not intended to diagnose infection nor to guide or monitor treatment.      Studies: Dg Chest 1 View  02/21/2014   CLINICAL DATA:  Fall.  Deformity to right hip.  EXAM: CHEST - 1 VIEW  COMPARISON:  03/27/2010  FINDINGS: Normal heart size. No pleural effusion or edema. Lung volumes are low. The patient is status post hardware fusion of the lower thoracic and lumbar spine. There is a an age indeterminate deformity involving the scapula. This is a new abnormality when compared with previous exams.  IMPRESSION: 1. No active cardiopulmonary abnormalities. 2. Age indeterminate scapular fracture. Recommend dedicated images of the right shoulder and scapula.   Electronically Signed   By: Kerby Moors M.D.   On: 02/21/2014 18:48   Dg Scapula Right  02/21/2014   CLINICAL DATA:  Fall from standing position on to right shoulder  EXAM: RIGHT SCAPULA - 2+ VIEWS  COMPARISON:  None.  FINDINGS: No acute fracture or dislocation is noted. The humeral head appears high-riding which may be related to an underlying rotator  cuff injury. Additionally increased sclerosis is noted in the midportion of the scapula. When compare with the recent shoulder film it appears there is a chronic fracture through the base of the scapular spine with nonunion. No other focal abnormality is seen.  IMPRESSION: No acute fracture noted.  There are changes consistent with an old fracture of the scapular spine.   Electronically Signed   By: Inez Catalina M.D.   On: 02/21/2014 19:46   Dg Shoulder Right  02/21/2014   CLINICAL DATA:  Per EMS pt from [facility] hanging wreath on door resulting in fall and deformity to right hip. 150 mcg of fentanyl given en route. Pt also having right shoulder and  scapular pain from fall.  EXAM: RIGHT SHOULDER - 2+ VIEW  COMPARISON:  None.  FINDINGS: There is a fracture of the scapular spine which has associated sclerosis. This has the appearance of a subacute or chronic fracture. Fracture may also involve the base of the coracoid process, which is suggested on the Y scapular view.  No other evidence of a fracture. AC joint and glenohumeral joint are normally aligned. There is narrowing of the subacromial space consistent with a chronic full-thickness rotator cuff tear.  Bones are diffusely demineralized.  IMPRESSION: 1. Scapular fracture, which is across the scapular spine. This may also involve the base of the coracoid process. There is sclerosis along the fracture margins of the fracture scapular spine suggesting that this is chronic or subacute. 2. No other fractures. No dislocation. Chronic full-thickness rotator cuff tear.   Electronically Signed   By: Lajean Manes M.D.   On: 02/21/2014 19:41   Dg Hip Complete Right  02/21/2014   CLINICAL DATA:  Fall from standing position on to right hip  EXAM: RIGHT HIP - COMPLETE 2+ VIEW  COMPARISON:  None.  FINDINGS: There are changes consistent with a comminuted intratrochanteric fracture with extension into the proximal femoral shaft. Impaction and angulation is noted at the fracture site. The femoral head is well seated. The pelvic ring is intact. Postsurgical changes are noted in the lumbar spine as well as in the anterior abdominal wall.  IMPRESSION: Comminuted intertrochanteric fracture with extension into the proximal femoral shaft.   Electronically Signed   By: Inez Catalina M.D.   On: 02/21/2014 18:52   Dg Femur Right  02/21/2014   CLINICAL DATA:  Per EMS pt from [facility] hanging wreath on door resulting in fall and deformity to right hip with severe right hip pain.  EXAM: RIGHT FEMUR - 2 VIEW  COMPARISON:  None.  FINDINGS: There is comminuted displaced fracture of the proximal right femur. Fracture extends  from the intertrochanteric portion to the proximal shaft. There is a butterfly fragment of the proximal shaft, displaced 19 mm medial to the distal shaft fracture component. There is a separate fracture the lesser trochanter, which is displaced more medially. The distal shaft fracture component is also displaced anteriorly in relation to the proximal shaft fracture component, by 18 mm. There is mild varus angulation between the shaft and femoral neck.  No other fractures. Right hip joint is normally spaced and aligned. Knee joint is normally aligned.  IMPRESSION: Comminuted, displaced fracture of the proximal right femur as described. No dislocation.   Electronically Signed   By: Lajean Manes M.D.   On: 02/21/2014 18:48   Ct Head Wo Contrast  02/21/2014   CLINICAL DATA:  Gait disturbance.  Fall.  Dizziness.  EXAM: CT HEAD WITHOUT  CONTRAST  TECHNIQUE: Contiguous axial images were obtained from the base of the skull through the vertex without intravenous contrast.  COMPARISON:  09/08/2013  FINDINGS: Low attenuation throughout the subcortical and periventricular white matter identified compatible with small vessel ischemic disease. Left occipital lobe encephalomalacia appears similar to previous exam. There is no evidence for acute brain infarct, hemorrhage or mass. The paranasal sinuses are clear. The mastoid air cells are also clear. The skull is intact.  IMPRESSION: 1. No acute intracranial abnormalities. 2. Advanced, chronic microvascular ischemic disease.   Electronically Signed   By: Kerby Moors M.D.   On: 02/21/2014 18:30    Scheduled Meds: . citalopram  10 mg Oral Daily  . docusate sodium  100 mg Oral BID  . sodium chloride  3 mL Intravenous Q12H   Continuous Infusions: . sodium chloride 75 mL/hr at 02/21/14 2311    Time spent: 15 minutes  [facility] [email or web address], password [facility] 02/22/2014, 4:23 PM  LOS: 1 day

## 2014-02-23 LAB — BASIC METABOLIC PANEL
Anion gap: 9 (ref 5–15)
BUN: 22 mg/dL (ref 6–23)
CO2: 30 mEq/L (ref 19–32)
Calcium: 8.8 mg/dL (ref 8.4–10.5)
Chloride: 105 mEq/L (ref 96–112)
Creatinine, Ser: 0.79 mg/dL (ref 0.50–1.10)
GFR calc Af Amer: 84 mL/min — ABNORMAL LOW (ref >90)
GFR calc non Af Amer: 73 mL/min — ABNORMAL LOW (ref >90)
Glucose, Bld: 128 mg/dL — ABNORMAL HIGH (ref 70–99)
POTASSIUM: 4 meq/L (ref 3.7–5.3)
SODIUM: 144 meq/L (ref 137–147)

## 2014-02-23 LAB — CBC
HCT: 26.2 % — ABNORMAL LOW (ref 36.0–46.0)
Hemoglobin: 8.6 g/dL — ABNORMAL LOW (ref 12.0–15.0)
MCH: 34 pg (ref 26.0–34.0)
MCHC: 32.8 g/dL (ref 30.0–36.0)
MCV: 103.6 fL — ABNORMAL HIGH (ref 78.0–100.0)
Platelets: 179 10*3/uL (ref 150–400)
RBC: 2.53 MIL/uL — ABNORMAL LOW (ref 3.87–5.11)
RDW: 13.5 % (ref 11.5–15.5)
WBC: 9.2 10*3/uL (ref 4.0–10.5)

## 2014-02-23 LAB — PREPARE RBC (CROSSMATCH)

## 2014-02-23 MED ORDER — ACETAMINOPHEN 325 MG PO TABS
650.0000 mg | ORAL_TABLET | Freq: Once | ORAL | Status: AC
  Administered 2014-02-23: 650 mg via ORAL

## 2014-02-23 MED ORDER — DIPHENHYDRAMINE HCL 25 MG PO CAPS
25.0000 mg | ORAL_CAPSULE | Freq: Once | ORAL | Status: AC
  Administered 2014-02-23: 25 mg via ORAL
  Filled 2014-02-23: qty 1

## 2014-02-23 MED ORDER — SODIUM CHLORIDE 0.9 % IV SOLN: Freq: Once | INTRAVENOUS | Status: DC

## 2014-02-23 NOTE — Progress Notes (Signed)
OT Cancellation Note  Patient Details Name: [name] MRN: [id number] DOB: 11-07-1924   Cancelled Treatment:    Reason Eval/Treat Not Completed: Medical issues which prohibited therapy. Pt planned for transfusion today. Will re-attempt OT as available.   Villa Herb M  Cyndie Chime, OTR/L Occupational Therapist [phone] (pager)  02/23/2014, 1:21 PM

## 2014-02-23 NOTE — Progress Notes (Signed)
[facility] PROGRESS NOTE  BENNETT VANSCYOC ZDG:[id number] DOB: 1924/05/04 DOA: 02/21/2014 PCP: Gerrit Heck, MD Summary 80 year old white female tripped and fell and fractured her right hip. Had IM nail placed by Dr. Mardelle Matte on 12/8  Assessment/Plan:  Principal Problem:   Fracture, intertrochanteric, right femur: Status post IM nail. Blood transfusion ordered by orthopedics. Has not yet worked with physical therapy.  Continue lovenox. To SNF after works with PT and h/h stable Active Problems:   Fall at home   Depression  HPI/Subjective: Pain controlled  Objective: Filed Vitals:   02/23/14 1532  BP: 114/47  Pulse: 78  Temp: 98.9 F (37.2 C)  Resp: 16    Intake/Output Summary (Last 24 hours) at 02/23/14 1547 Last data filed at 02/23/14 1517  Gross per 24 hour  Intake 3201.5 ml  Output    500 ml  Net 2701.5 ml   There were no vitals filed for this visit.  Exam:   General:  Alert, cooperative, no distress, mild confusion  Cardiovascular: RRR without MGR  Respiratory: CTA without WRR  Abdomen: S, NT, ND  Ext: no edema.   Basic Metabolic Panel:  Recent Labs Lab 02/21/14 1734 02/22/14 0525 02/23/14 0433  NA 141 144 144  K 3.8 4.1 4.0  CL 101 105 105  CO2 29 29 30   GLUCOSE 128* 182* 128*  BUN 22 23 22   CREATININE 0.95 0.85 0.79  CALCIUM 9.6 9.3 8.8   Liver Function Tests: No results for input(s): AST, ALT, ALKPHOS, BILITOT, PROT, ALBUMIN in the last 168 hours. No results for input(s): LIPASE, AMYLASE in the last 168 hours. No results for input(s): AMMONIA in the last 168 hours. CBC:  Recent Labs Lab 02/21/14 1734 02/22/14 0525 02/23/14 0433  WBC 7.6 10.3 9.2  NEUTROABS 5.6  --   --   HGB 14.0 11.1* 8.6*  HCT 42.9 33.9* 26.2*  MCV 102.9* 104.0* 103.6*  PLT 209 195 179   Cardiac Enzymes: No results for input(s): CKTOTAL, CKMB, CKMBINDEX, TROPONINI in the last 168 hours. BNP (last 3 results) No results for input(s): PROBNP  in the last 8760 hours. CBG: No results for input(s): GLUCAP in the last 168 hours.  Recent Results (from the past 240 hour(s))  Surgical pcr screen     Status: None   Collection Time: 02/22/14 12:40 AM  Result Value Ref Range Status   MRSA, PCR NEGATIVE NEGATIVE Final   Staphylococcus aureus NEGATIVE NEGATIVE Final    Comment:        The Xpert SA Assay (FDA approved for NASAL specimens in patients over 55 years of age), is one component of a comprehensive surveillance program.  Test performance has been validated by [employer] for patients greater than or equal to 46 year old. It is not intended to diagnose infection nor to guide or monitor treatment.      Studies: Dg Chest 1 View  02/21/2014   CLINICAL DATA:  Fall.  Deformity to right hip.  EXAM: CHEST - 1 VIEW  COMPARISON:  03/27/2010  FINDINGS: Normal heart size. No pleural effusion or edema. Lung volumes are low. The patient is status post hardware fusion of the lower thoracic and lumbar spine. There is a an age indeterminate deformity involving the scapula. This is a new abnormality when compared with previous exams.  IMPRESSION: 1. No active cardiopulmonary abnormalities. 2. Age indeterminate scapular fracture. Recommend dedicated images of the right shoulder and scapula.   Electronically Signed   By: Kerby Moors  M.D.   On: 02/21/2014 18:48   Dg Scapula Right  02/21/2014   CLINICAL DATA:  Fall from standing position on to right shoulder  EXAM: RIGHT SCAPULA - 2+ VIEWS  COMPARISON:  None.  FINDINGS: No acute fracture or dislocation is noted. The humeral head appears high-riding which may be related to an underlying rotator cuff injury. Additionally increased sclerosis is noted in the midportion of the scapula. When compare with the recent shoulder film it appears there is a chronic fracture through the base of the scapular spine with nonunion. No other focal abnormality is seen.  IMPRESSION: No acute fracture noted.  There  are changes consistent with an old fracture of the scapular spine.   Electronically Signed   By: Inez Catalina M.D.   On: 02/21/2014 19:46   Dg Shoulder Right  02/21/2014   CLINICAL DATA:  Per EMS pt from [facility] hanging wreath on door resulting in fall and deformity to right hip. 150 mcg of fentanyl given en route. Pt also having right shoulder and scapular pain from fall.  EXAM: RIGHT SHOULDER - 2+ VIEW  COMPARISON:  None.  FINDINGS: There is a fracture of the scapular spine which has associated sclerosis. This has the appearance of a subacute or chronic fracture. Fracture may also involve the base of the coracoid process, which is suggested on the Y scapular view.  No other evidence of a fracture. AC joint and glenohumeral joint are normally aligned. There is narrowing of the subacromial space consistent with a chronic full-thickness rotator cuff tear.  Bones are diffusely demineralized.  IMPRESSION: 1. Scapular fracture, which is across the scapular spine. This may also involve the base of the coracoid process. There is sclerosis along the fracture margins of the fracture scapular spine suggesting that this is chronic or subacute. 2. No other fractures. No dislocation. Chronic full-thickness rotator cuff tear.   Electronically Signed   By: Lajean Manes M.D.   On: 02/21/2014 19:41   Dg Hip Complete Right  02/21/2014   CLINICAL DATA:  Fall from standing position on to right hip  EXAM: RIGHT HIP - COMPLETE 2+ VIEW  COMPARISON:  None.  FINDINGS: There are changes consistent with a comminuted intratrochanteric fracture with extension into the proximal femoral shaft. Impaction and angulation is noted at the fracture site. The femoral head is well seated. The pelvic ring is intact. Postsurgical changes are noted in the lumbar spine as well as in the anterior abdominal wall.  IMPRESSION: Comminuted intertrochanteric fracture with extension into the proximal femoral shaft.   Electronically Signed   By:  Inez Catalina M.D.   On: 02/21/2014 18:52   Dg Femur Right  02/22/2014   CLINICAL DATA:  ORIF of right femur fracture  EXAM: DG C-ARM 61-120 MIN; RIGHT FEMUR - 2 VIEW  FLUOROSCOPY TIME:  2 min 45 seconds  COMPARISON:  02/21/2014  FINDINGS: Intraoperative fluoroscopic images during IM nail with dynamic hip screw fixation of a subtrochanteric right proximal femur fracture.  Dominant fracture fragments are in near anatomic alignment and position.  Lesser trochanteric fragment is mildly displaced.  IMPRESSION: Intraoperative fluoroscopic images during ORIF of a right femur fracture, as above.   Electronically Signed   By: Julian Hy M.D.   On: 02/22/2014 21:52   Dg Femur Right  02/21/2014   CLINICAL DATA:  Per EMS pt from [facility] hanging wreath on door resulting in fall and deformity to right hip with severe right hip pain.  EXAM: RIGHT FEMUR - 2 VIEW  COMPARISON:  None.  FINDINGS: There is comminuted displaced fracture of the proximal right femur. Fracture extends from the intertrochanteric portion to the proximal shaft. There is a butterfly fragment of the proximal shaft, displaced 19 mm medial to the distal shaft fracture component. There is a separate fracture the lesser trochanter, which is displaced more medially. The distal shaft fracture component is also displaced anteriorly in relation to the proximal shaft fracture component, by 18 mm. There is mild varus angulation between the shaft and femoral neck.  No other fractures. Right hip joint is normally spaced and aligned. Knee joint is normally aligned.  IMPRESSION: Comminuted, displaced fracture of the proximal right femur as described. No dislocation.   Electronically Signed   By: Lajean Manes M.D.   On: 02/21/2014 18:48   Ct Head Wo Contrast  02/21/2014   CLINICAL DATA:  Gait disturbance.  Fall.  Dizziness.  EXAM: CT HEAD WITHOUT CONTRAST  TECHNIQUE: Contiguous axial images were obtained from the base of the skull through the vertex  without intravenous contrast.  COMPARISON:  09/08/2013  FINDINGS: Low attenuation throughout the subcortical and periventricular white matter identified compatible with small vessel ischemic disease. Left occipital lobe encephalomalacia appears similar to previous exam. There is no evidence for acute brain infarct, hemorrhage or mass. The paranasal sinuses are clear. The mastoid air cells are also clear. The skull is intact.  IMPRESSION: 1. No acute intracranial abnormalities. 2. Advanced, chronic microvascular ischemic disease.   Electronically Signed   By: Kerby Moors M.D.   On: 02/21/2014 18:30   Pelvis Portable  02/22/2014   CLINICAL DATA:  Postop femur repair  EXAM: PORTABLE PELVIS 1-2 VIEWS  COMPARISON:  None.  FINDINGS: Status post ORIF of the right hip, better described on dedicated right hip/femur radiographs.  Visualized bony pelvis appears intact.  Postsurgical changes involving the lower lumbosacral spine.  Mesh overlying the right lower abdomen.  IMPRESSION: Status post ORIF of the right hip.  Visualized bony pelvis appears intact.  Postsurgical changes involving the lower lumbosacral spine.   Electronically Signed   By: Julian Hy M.D.   On: 02/22/2014 21:55   Hip Portable 1 View Right  02/22/2014   CLINICAL DATA:  Postop femur repair  EXAM: PORTABLE RIGHT HIP - 1 VIEW  COMPARISON:  None.  FINDINGS: IM nail with dynamic hip screw fixation of a subtrochanteric right hip fracture.  Dominant fracture fragments are in near anatomic alignment and position.  Lesser trochanteric fragment is displaced.  IMPRESSION: Status post ORIF of a right hip fracture, as above.   Electronically Signed   By: Julian Hy M.D.   On: 02/22/2014 21:54   Dg C-arm 61-120 Min  02/22/2014   CLINICAL DATA:  ORIF of right femur fracture  EXAM: DG C-ARM 61-120 MIN; RIGHT FEMUR - 2 VIEW  FLUOROSCOPY TIME:  2 min 45 seconds  COMPARISON:  02/21/2014  FINDINGS: Intraoperative fluoroscopic images during IM nail  with dynamic hip screw fixation of a subtrochanteric right proximal femur fracture.  Dominant fracture fragments are in near anatomic alignment and position.  Lesser trochanteric fragment is mildly displaced.  IMPRESSION: Intraoperative fluoroscopic images during ORIF of a right femur fracture, as above.   Electronically Signed   By: Julian Hy M.D.   On: 02/22/2014 21:52    Scheduled Meds: . sodium chloride   Intravenous Once  . sodium chloride   Intravenous Once  . citalopram  10 mg Oral  Daily  . docusate sodium  100 mg Oral BID  . enoxaparin (LOVENOX) injection  40 mg Subcutaneous Q24H  . senna  1 tablet Oral BID   Continuous Infusions: . 0.45 % NaCl with KCl 20 mEq / L 75 mL/hr at 02/22/14 2200    Time spent: 15 minutes  [facility] [email or web address], password [facility] 02/23/2014, 3:47 PM  LOS: 2 days

## 2014-02-23 NOTE — Progress Notes (Signed)
Patient ID: JEANEAN HOLLETT, female   DOB: 1925/01/04, 80 y.o.   MRN: [id number]     Subjective:  Patient reports pain as mild to moderate.  Patient doing okay just concerned that she can not get anyone to help when she calls  Objective:   VITALS:   Filed Vitals:   02/22/14 2214 02/22/14 2359 02/23/14 0400 02/23/14 0700  BP: 157/64   158/62  Pulse: 106   98  Temp: 97.5 F (36.4 C)   97.8 F (36.6 C)  TempSrc:      Resp: 22 16 16 18   SpO2: 97% 99% 93% 94%    ABD soft Sensation intact distally Dorsiflexion/Plantar flexion intact Incision: dressing C/D/I and moderate drainage Wound well approximated and no sign of infection Dry dressing reapplied   Lab Results  Component Value Date   WBC 9.2 02/23/2014   HGB 8.6* 02/23/2014   HCT 26.2* 02/23/2014   MCV 103.6* 02/23/2014   PLT 179 02/23/2014   BMET    Component Value Date/Time   NA 144 02/23/2014 0433   K 4.0 02/23/2014 0433   CL 105 02/23/2014 0433   CO2 30 02/23/2014 0433   GLUCOSE 128* 02/23/2014 0433   BUN 22 02/23/2014 0433   CREATININE 0.79 02/23/2014 0433   CALCIUM 8.8 02/23/2014 0433   GFRNONAA 73* 02/23/2014 0433   GFRAA 84* 02/23/2014 0433     Assessment/Plan: 1 Day Post-Op   Principal Problem:   Fracture, subtrochanteric, right femur, closed Active Problems:   Gait disorder   Fall at home   Head injury   Depression   Advance diet Up with therapy  ABLA Plan for two units PRBC WBAT Continue plan per medicine Dry dressing PRN   DOUGLAS PARRY, BRANDON 02/23/2014, 7:54 AM  Seen and agree. ABLA, expect larger drop, will plan transfusion.  Marchia Bond, MD Cell [phone]

## 2014-02-23 NOTE — Progress Notes (Signed)
PT Cancellation Note  Patient Details Name: [name] MRN: [id number] DOB: Mar 28, 1924   Cancelled Treatment:     PT attempted am and pm - deferred at request of RN 2* ongoing blood transfusion.  Will follow.   Valla Pacey 02/23/2014, 4:42 PM

## 2014-02-24 DIAGNOSIS — M79661 Pain in right lower leg: Secondary | ICD-10-CM | POA: Diagnosis not present

## 2014-02-24 DIAGNOSIS — S7221XD Displaced subtrochanteric fracture of right femur, subsequent encounter for closed fracture with routine healing: Secondary | ICD-10-CM | POA: Diagnosis not present

## 2014-02-24 DIAGNOSIS — M85852 Other specified disorders of bone density and structure, left thigh: Secondary | ICD-10-CM | POA: Diagnosis not present

## 2014-02-24 DIAGNOSIS — M25562 Pain in left knee: Secondary | ICD-10-CM | POA: Diagnosis not present

## 2014-02-24 DIAGNOSIS — S7221XA Displaced subtrochanteric fracture of right femur, initial encounter for closed fracture: Secondary | ICD-10-CM | POA: Diagnosis not present

## 2014-02-24 DIAGNOSIS — R011 Cardiac murmur, unspecified: Secondary | ICD-10-CM | POA: Diagnosis not present

## 2014-02-24 DIAGNOSIS — M4800 Spinal stenosis, site unspecified: Secondary | ICD-10-CM | POA: Diagnosis not present

## 2014-02-24 DIAGNOSIS — R278 Other lack of coordination: Secondary | ICD-10-CM | POA: Diagnosis not present

## 2014-02-24 DIAGNOSIS — E039 Hypothyroidism, unspecified: Secondary | ICD-10-CM | POA: Diagnosis not present

## 2014-02-24 DIAGNOSIS — I10 Essential (primary) hypertension: Secondary | ICD-10-CM | POA: Diagnosis not present

## 2014-02-24 DIAGNOSIS — M25561 Pain in right knee: Secondary | ICD-10-CM | POA: Diagnosis not present

## 2014-02-24 DIAGNOSIS — Z780 Asymptomatic menopausal state: Secondary | ICD-10-CM | POA: Diagnosis not present

## 2014-02-24 DIAGNOSIS — K5900 Constipation, unspecified: Secondary | ICD-10-CM | POA: Diagnosis not present

## 2014-02-24 DIAGNOSIS — M21251 Flexion deformity, right hip: Secondary | ICD-10-CM | POA: Diagnosis not present

## 2014-02-24 DIAGNOSIS — I679 Cerebrovascular disease, unspecified: Secondary | ICD-10-CM | POA: Diagnosis not present

## 2014-02-24 DIAGNOSIS — J984 Other disorders of lung: Secondary | ICD-10-CM | POA: Diagnosis not present

## 2014-02-24 DIAGNOSIS — R41841 Cognitive communication deficit: Secondary | ICD-10-CM | POA: Diagnosis not present

## 2014-02-24 DIAGNOSIS — M6281 Muscle weakness (generalized): Secondary | ICD-10-CM | POA: Diagnosis not present

## 2014-02-24 DIAGNOSIS — M25551 Pain in right hip: Secondary | ICD-10-CM | POA: Diagnosis not present

## 2014-02-24 DIAGNOSIS — K219 Gastro-esophageal reflux disease without esophagitis: Secondary | ICD-10-CM | POA: Diagnosis not present

## 2014-02-24 DIAGNOSIS — S7221XS Displaced subtrochanteric fracture of right femur, sequela: Secondary | ICD-10-CM | POA: Diagnosis not present

## 2014-02-24 DIAGNOSIS — D62 Acute posthemorrhagic anemia: Secondary | ICD-10-CM | POA: Diagnosis not present

## 2014-02-24 DIAGNOSIS — Y92099 Unspecified place in other non-institutional residence as the place of occurrence of the external cause: Secondary | ICD-10-CM | POA: Diagnosis not present

## 2014-02-24 DIAGNOSIS — L539 Erythematous condition, unspecified: Secondary | ICD-10-CM | POA: Diagnosis not present

## 2014-02-24 DIAGNOSIS — W19XXXA Unspecified fall, initial encounter: Secondary | ICD-10-CM | POA: Diagnosis not present

## 2014-02-24 DIAGNOSIS — M5134 Other intervertebral disc degeneration, thoracic region: Secondary | ICD-10-CM | POA: Diagnosis not present

## 2014-02-24 DIAGNOSIS — R2681 Unsteadiness on feet: Secondary | ICD-10-CM | POA: Diagnosis not present

## 2014-02-24 DIAGNOSIS — F329 Major depressive disorder, single episode, unspecified: Secondary | ICD-10-CM | POA: Diagnosis not present

## 2014-02-24 DIAGNOSIS — N390 Urinary tract infection, site not specified: Secondary | ICD-10-CM | POA: Diagnosis not present

## 2014-02-24 LAB — CBC
HCT: 31.2 % — ABNORMAL LOW (ref 36.0–46.0)
HEMOGLOBIN: 10.6 g/dL — AB (ref 12.0–15.0)
MCH: 33.1 pg (ref 26.0–34.0)
MCHC: 34 g/dL (ref 30.0–36.0)
MCV: 97.5 fL (ref 78.0–100.0)
Platelets: 134 10*3/uL — ABNORMAL LOW (ref 150–400)
RBC: 3.2 MIL/uL — ABNORMAL LOW (ref 3.87–5.11)
RDW: 16.1 % — ABNORMAL HIGH (ref 11.5–15.5)
WBC: 6.1 10*3/uL (ref 4.0–10.5)

## 2014-02-24 LAB — TYPE AND SCREEN
ABO/RH(D): A POS
Antibody Screen: NEGATIVE
UNIT DIVISION: 0
Unit division: 0

## 2014-02-24 LAB — BASIC METABOLIC PANEL
Anion gap: 10 (ref 5–15)
BUN: 16 mg/dL (ref 6–23)
CHLORIDE: 102 meq/L (ref 96–112)
CO2: 28 mEq/L (ref 19–32)
Calcium: 9.1 mg/dL (ref 8.4–10.5)
Creatinine, Ser: 0.62 mg/dL (ref 0.50–1.10)
GFR calc Af Amer: >90 mL/min (ref >90)
GFR, EST NON AFRICAN AMERICAN: 79 mL/min — AB (ref >90)
GLUCOSE: 107 mg/dL — AB (ref 70–99)
Potassium: 3.5 mEq/L — ABNORMAL LOW (ref 3.7–5.3)
SODIUM: 140 meq/L (ref 137–147)

## 2014-02-24 MED ORDER — POTASSIUM CHLORIDE CRYS ER 20 MEQ PO TBCR
40.0000 meq | EXTENDED_RELEASE_TABLET | Freq: Once | ORAL | Status: AC
  Administered 2014-02-24: 40 meq via ORAL
  Filled 2014-02-24: qty 2

## 2014-02-24 NOTE — Discharge Summary (Signed)
Physician Discharge Summary  [name]:[id number] DOB: 04-Oct-1924 DOA: 02/21/2014  PCP: Gerrit Heck, MD  Admit date: 02/21/2014 Discharge date: 02/24/2014  Time spent: 35 minutes  Recommendations for Outpatient Follow-up:  1. Cbc 1 week  Discharge Diagnoses:  Principal Problem:   Fracture, subtrochanteric, right femur, closed Active Problems:   Gait disorder   Fall at home   Head injury   Depression   Discharge Condition: improved  Diet recommendation: cardiac  There were no vitals filed for this visit.  History of present illness:  [name] is a 80 y.o. female with a history of gait disorder, currently being seen by Dr. Jannifer Franklin of neurology with previous workups including MRI of brain performed on 01/21/2011 that showed moderate small vessel disease, gastroesophageal reflux disease, he was transferred to the emergency department from her independent living facility after sustaining a fall earlier in the evening. Patient reporting being her usual state health, was at home hanging a Christmas wreath losing her balance and falling backwards. There was no loss of consciousness. She experienced immediate pain to her right hip and was unable to bear weight. She activated life alert system with staff members responding and calling EMS. She denied any symptoms prior to the event and attributes her fall to losing her balance. Imaging studies performed in the emergency department revealed a comminuted intertrochanteric fracture with extension into the proximal femoral shaft. Dr Mardelle Matte of orthopedic surgery was consulted who recommended that patient be transferred to [facility] likely to undergo ORIF in a.m.  Hospital Course:  Fracture, intertrochanteric, right femur: Status post IM nail. Blood transfusion ordered by orthopedics. Has not yet worked with physical therapy. Continue lovenox. To SNF  Anemia- s/p 2 units, ABLA post op  Fall at  home  Depression Hypokalemia- replete  Procedures:  ORIF  Consultations:  ortho  Discharge Exam: Filed Vitals:   02/24/14 1204  BP: 118/54  Pulse: 76  Temp: 98.5 F (36.9 C)  Resp: 16      Discharge Instructions You were cared for by a hospitalist during your hospital stay. If you have any questions about your discharge medications or the care you received while you were in the hospital after you are discharged, you can call the unit and asked to speak with the hospitalist on call if the hospitalist that took care of you is not available. Once you are discharged, your primary care physician will handle any further medical issues. Please note that NO REFILLS for any discharge medications will be authorized once you are discharged, as it is imperative that you return to your primary care physician (or establish a relationship with a primary care physician if you do not have one) for your aftercare needs so that they can reassess your need for medications and monitor your lab values.  Discharge Instructions    Discharge instructions    Complete by:  As directed   Cbc 1 week     Increase activity slowly    Complete by:  As directed      Weight bearing as tolerated    Complete by:  As directed           Current Discharge Medication List    START taking these medications   Details  enoxaparin (LOVENOX) 300 MG/3ML SOLN injection Inject 0.3 mLs (30 mg total) into the skin daily. Qty: 21 vial, Refills: 0    sennosides-docusate sodium (SENOKOT-S) 8.6-50 MG tablet Take 2 tablets by mouth daily. Qty: 30  tablet, Refills: 1      CONTINUE these medications which have CHANGED   Details  HYDROcodone-acetaminophen (NORCO) 5-325 MG per tablet Take 1-2 tablets by mouth every 6 (six) hours as needed for moderate pain. MAXIMUM TOTAL ACETAMINOPHEN DOSE IS 4000 MG PER DAY Qty: 50 tablet, Refills: 0      CONTINUE these medications which have NOT CHANGED   Details  acetaminophen  (TYLENOL) 650 MG CR tablet Take 650 mg by mouth 2 (two) times daily.    Calcium Carbonate-Vitamin D (CALTRATE 600+D) 600-400 MG-UNIT per tablet Take 1 tablet by mouth daily.    cholecalciferol (VITAMIN D) 1000 UNITS tablet Take 1,000 Units by mouth daily.    diclofenac sodium (VOLTAREN) 1 % GEL Apply 4 g topically 4 (four) times daily.    Misc Natural Products (OSTEO BI-FLEX JOINT SHIELD) TABS Take 1 tablet by mouth daily.    Multiple Vitamin (MULTIVITAMIN) tablet Take 1 tablet by mouth daily.      STOP taking these medications     fish oil-omega-3 fatty acids 1000 MG capsule      hydrochlorothiazide (MICROZIDE) 12.5 MG capsule      citalopram (CELEXA) 10 MG tablet        Allergies  Allergen Reactions  . Novocain [Procaine]     Passed  out  . Limbrel [Flavocoxid]     Dizziness   . Sertraline Anxiety   Follow-up Information    Follow up with Johnny Bridge, MD. Schedule an appointment as soon as possible for a visit in 2 weeks.   Specialty:  Orthopedic Surgery   Contact information:   [address] [place] [address] [phone]        The results of significant diagnostics from this hospitalization (including imaging, microbiology, ancillary and laboratory) are listed below for reference.    Significant Diagnostic Studies: Dg Chest 1 View  02/21/2014   CLINICAL DATA:  Fall.  Deformity to right hip.  EXAM: CHEST - 1 VIEW  COMPARISON:  03/27/2010  FINDINGS: Normal heart size. No pleural effusion or edema. Lung volumes are low. The patient is status post hardware fusion of the lower thoracic and lumbar spine. There is a an age indeterminate deformity involving the scapula. This is a new abnormality when compared with previous exams.  IMPRESSION: 1. No active cardiopulmonary abnormalities. 2. Age indeterminate scapular fracture. Recommend dedicated images of the right shoulder and scapula.   Electronically Signed   By: Kerby Moors M.D.   On:  02/21/2014 18:48   Dg Scapula Right  02/21/2014   CLINICAL DATA:  Fall from standing position on to right shoulder  EXAM: RIGHT SCAPULA - 2+ VIEWS  COMPARISON:  None.  FINDINGS: No acute fracture or dislocation is noted. The humeral head appears high-riding which may be related to an underlying rotator cuff injury. Additionally increased sclerosis is noted in the midportion of the scapula. When compare with the recent shoulder film it appears there is a chronic fracture through the base of the scapular spine with nonunion. No other focal abnormality is seen.  IMPRESSION: No acute fracture noted.  There are changes consistent with an old fracture of the scapular spine.   Electronically Signed   By: Inez Catalina M.D.   On: 02/21/2014 19:46   Dg Shoulder Right  02/21/2014   CLINICAL DATA:  Per EMS pt from [facility] hanging wreath on door resulting in fall and deformity to right hip. 150 mcg of fentanyl given en  route. Pt also having right shoulder and scapular pain from fall.  EXAM: RIGHT SHOULDER - 2+ VIEW  COMPARISON:  None.  FINDINGS: There is a fracture of the scapular spine which has associated sclerosis. This has the appearance of a subacute or chronic fracture. Fracture may also involve the base of the coracoid process, which is suggested on the Y scapular view.  No other evidence of a fracture. AC joint and glenohumeral joint are normally aligned. There is narrowing of the subacromial space consistent with a chronic full-thickness rotator cuff tear.  Bones are diffusely demineralized.  IMPRESSION: 1. Scapular fracture, which is across the scapular spine. This may also involve the base of the coracoid process. There is sclerosis along the fracture margins of the fracture scapular spine suggesting that this is chronic or subacute. 2. No other fractures. No dislocation. Chronic full-thickness rotator cuff tear.   Electronically Signed   By: Lajean Manes M.D.   On: 02/21/2014 19:41   Dg Hip  Complete Right  02/21/2014   CLINICAL DATA:  Fall from standing position on to right hip  EXAM: RIGHT HIP - COMPLETE 2+ VIEW  COMPARISON:  None.  FINDINGS: There are changes consistent with a comminuted intratrochanteric fracture with extension into the proximal femoral shaft. Impaction and angulation is noted at the fracture site. The femoral head is well seated. The pelvic ring is intact. Postsurgical changes are noted in the lumbar spine as well as in the anterior abdominal wall.  IMPRESSION: Comminuted intertrochanteric fracture with extension into the proximal femoral shaft.   Electronically Signed   By: Inez Catalina M.D.   On: 02/21/2014 18:52   Dg Femur Right  02/22/2014   CLINICAL DATA:  ORIF of right femur fracture  EXAM: DG C-ARM 61-120 MIN; RIGHT FEMUR - 2 VIEW  FLUOROSCOPY TIME:  2 min 45 seconds  COMPARISON:  02/21/2014  FINDINGS: Intraoperative fluoroscopic images during IM nail with dynamic hip screw fixation of a subtrochanteric right proximal femur fracture.  Dominant fracture fragments are in near anatomic alignment and position.  Lesser trochanteric fragment is mildly displaced.  IMPRESSION: Intraoperative fluoroscopic images during ORIF of a right femur fracture, as above.   Electronically Signed   By: Julian Hy M.D.   On: 02/22/2014 21:52   Dg Femur Right  02/21/2014   CLINICAL DATA:  Per EMS pt from [facility] hanging wreath on door resulting in fall and deformity to right hip with severe right hip pain.  EXAM: RIGHT FEMUR - 2 VIEW  COMPARISON:  None.  FINDINGS: There is comminuted displaced fracture of the proximal right femur. Fracture extends from the intertrochanteric portion to the proximal shaft. There is a butterfly fragment of the proximal shaft, displaced 19 mm medial to the distal shaft fracture component. There is a separate fracture the lesser trochanter, which is displaced more medially. The distal shaft fracture component is also displaced anteriorly in  relation to the proximal shaft fracture component, by 18 mm. There is mild varus angulation between the shaft and femoral neck.  No other fractures. Right hip joint is normally spaced and aligned. Knee joint is normally aligned.  IMPRESSION: Comminuted, displaced fracture of the proximal right femur as described. No dislocation.   Electronically Signed   By: Lajean Manes M.D.   On: 02/21/2014 18:48   Ct Head Wo Contrast  02/21/2014   CLINICAL DATA:  Gait disturbance.  Fall.  Dizziness.  EXAM: CT HEAD WITHOUT CONTRAST  TECHNIQUE: Contiguous axial images  were obtained from the base of the skull through the vertex without intravenous contrast.  COMPARISON:  09/08/2013  FINDINGS: Low attenuation throughout the subcortical and periventricular white matter identified compatible with small vessel ischemic disease. Left occipital lobe encephalomalacia appears similar to previous exam. There is no evidence for acute brain infarct, hemorrhage or mass. The paranasal sinuses are clear. The mastoid air cells are also clear. The skull is intact.  IMPRESSION: 1. No acute intracranial abnormalities. 2. Advanced, chronic microvascular ischemic disease.   Electronically Signed   By: Kerby Moors M.D.   On: 02/21/2014 18:30   Pelvis Portable  02/22/2014   CLINICAL DATA:  Postop femur repair  EXAM: PORTABLE PELVIS 1-2 VIEWS  COMPARISON:  None.  FINDINGS: Status post ORIF of the right hip, better described on dedicated right hip/femur radiographs.  Visualized bony pelvis appears intact.  Postsurgical changes involving the lower lumbosacral spine.  Mesh overlying the right lower abdomen.  IMPRESSION: Status post ORIF of the right hip.  Visualized bony pelvis appears intact.  Postsurgical changes involving the lower lumbosacral spine.   Electronically Signed   By: Julian Hy M.D.   On: 02/22/2014 21:55   Hip Portable 1 View Right  02/22/2014   CLINICAL DATA:  Postop femur repair  EXAM: PORTABLE RIGHT HIP - 1 VIEW   COMPARISON:  None.  FINDINGS: IM nail with dynamic hip screw fixation of a subtrochanteric right hip fracture.  Dominant fracture fragments are in near anatomic alignment and position.  Lesser trochanteric fragment is displaced.  IMPRESSION: Status post ORIF of a right hip fracture, as above.   Electronically Signed   By: Julian Hy M.D.   On: 02/22/2014 21:54   Dg C-arm 61-120 Min  02/22/2014   CLINICAL DATA:  ORIF of right femur fracture  EXAM: DG C-ARM 61-120 MIN; RIGHT FEMUR - 2 VIEW  FLUOROSCOPY TIME:  2 min 45 seconds  COMPARISON:  02/21/2014  FINDINGS: Intraoperative fluoroscopic images during IM nail with dynamic hip screw fixation of a subtrochanteric right proximal femur fracture.  Dominant fracture fragments are in near anatomic alignment and position.  Lesser trochanteric fragment is mildly displaced.  IMPRESSION: Intraoperative fluoroscopic images during ORIF of a right femur fracture, as above.   Electronically Signed   By: Julian Hy M.D.   On: 02/22/2014 21:52    Microbiology: Recent Results (from the past 240 hour(s))  Surgical pcr screen     Status: None   Collection Time: 02/22/14 12:40 AM  Result Value Ref Range Status   MRSA, PCR NEGATIVE NEGATIVE Final   Staphylococcus aureus NEGATIVE NEGATIVE Final    Comment:        The Xpert SA Assay (FDA approved for NASAL specimens in patients over 33 years of age), is one component of a comprehensive surveillance program.  Test performance has been validated by [employer] for patients greater than or equal to 69 year old. It is not intended to diagnose infection nor to guide or monitor treatment.      Labs: Basic Metabolic Panel:  Recent Labs Lab 02/21/14 1734 02/22/14 0525 02/23/14 0433 02/24/14 0520  NA 141 144 144 140  K 3.8 4.1 4.0 3.5*  CL 101 105 105 102  CO2 29 29 30 28   GLUCOSE 128* 182* 128* 107*  BUN 22 23 22 16   CREATININE 0.95 0.85 0.79 0.62  CALCIUM 9.6 9.3 8.8 9.1   Liver Function  Tests: No results for input(s): AST, ALT, ALKPHOS, BILITOT, PROT, ALBUMIN  in the last 168 hours. No results for input(s): LIPASE, AMYLASE in the last 168 hours. No results for input(s): AMMONIA in the last 168 hours. CBC:  Recent Labs Lab 02/21/14 1734 02/22/14 0525 02/23/14 0433 02/24/14 0520  WBC 7.6 10.3 9.2 6.1  NEUTROABS 5.6  --   --   --   HGB 14.0 11.1* 8.6* 10.6*  HCT 42.9 33.9* 26.2* 31.2*  MCV 102.9* 104.0* 103.6* 97.5  PLT 209 195 179 134*   Cardiac Enzymes: No results for input(s): CKTOTAL, CKMB, CKMBINDEX, TROPONINI in the last 168 hours. BNP: BNP (last 3 results) No results for input(s): PROBNP in the last 8760 hours. CBG: No results for input(s): GLUCAP in the last 168 hours.     SignedEulogio Bear  [facility] 02/24/2014, 2:04 PM

## 2014-02-24 NOTE — Progress Notes (Addendum)
[facility] PROGRESS NOTE  [name] LEX:[id number] DOB: January 05, 1925 DOA: 02/21/2014 PCP: Gerrit Heck, MD Summary 80 year old white female tripped and fell and fractured her right hip. Had IM nail placed by Dr. Mardelle Matte on 12/8  Assessment/Plan:     Fracture, intertrochanteric, right femur: Status post IM nail. Blood transfusion ordered by orthopedics. Has not yet worked with physical therapy.  Continue lovenox. To SNF after works with PT and h/h stable Anemia- s/p 2 units, ABLA post op   Fall at home   Depression Hypokalemia- replete  HPI/Subjective: Wants to go home  Objective: Filed Vitals:   02/24/14 0800  BP:   Pulse:   Temp:   Resp: 18    Intake/Output Summary (Last 24 hours) at 02/24/14 0932 Last data filed at 02/24/14 0857  Gross per 24 hour  Intake 1626.25 ml  Output      0 ml  Net 1626.25 ml   There were no vitals filed for this visit.  Exam:   General:  Alert, cooperative, no distress  Cardiovascular: RRR without MGR  Respiratory: CTA without WRR  Abdomen: S, NT, ND  Ext: no edema.   Basic Metabolic Panel:  Recent Labs Lab 02/21/14 1734 02/22/14 0525 02/23/14 0433 02/24/14 0520  NA 141 144 144 140  K 3.8 4.1 4.0 3.5*  CL 101 105 105 102  CO2 29 29 30 28   GLUCOSE 128* 182* 128* 107*  BUN 22 23 22 16   CREATININE 0.95 0.85 0.79 0.62  CALCIUM 9.6 9.3 8.8 9.1   Liver Function Tests: No results for input(s): AST, ALT, ALKPHOS, BILITOT, PROT, ALBUMIN in the last 168 hours. No results for input(s): LIPASE, AMYLASE in the last 168 hours. No results for input(s): AMMONIA in the last 168 hours. CBC:  Recent Labs Lab 02/21/14 1734 02/22/14 0525 02/23/14 0433 02/24/14 0520  WBC 7.6 10.3 9.2 6.1  NEUTROABS 5.6  --   --   --   HGB 14.0 11.1* 8.6* 10.6*  HCT 42.9 33.9* 26.2* 31.2*  MCV 102.9* 104.0* 103.6* 97.5  PLT 209 195 179 134*   Cardiac Enzymes: No results for input(s): CKTOTAL, CKMB, CKMBINDEX, TROPONINI in  the last 168 hours. BNP (last 3 results) No results for input(s): PROBNP in the last 8760 hours. CBG: No results for input(s): GLUCAP in the last 168 hours.  Recent Results (from the past 240 hour(s))  Surgical pcr screen     Status: None   Collection Time: 02/22/14 12:40 AM  Result Value Ref Range Status   MRSA, PCR NEGATIVE NEGATIVE Final   Staphylococcus aureus NEGATIVE NEGATIVE Final    Comment:        The Xpert SA Assay (FDA approved for NASAL specimens in patients over 75 years of age), is one component of a comprehensive surveillance program.  Test performance has been validated by [employer] for patients greater than or equal to 105 year old. It is not intended to diagnose infection nor to guide or monitor treatment.      Studies: Dg Femur Right  02/22/2014   CLINICAL DATA:  ORIF of right femur fracture  EXAM: DG C-ARM 61-120 MIN; RIGHT FEMUR - 2 VIEW  FLUOROSCOPY TIME:  2 min 45 seconds  COMPARISON:  02/21/2014  FINDINGS: Intraoperative fluoroscopic images during IM nail with dynamic hip screw fixation of a subtrochanteric right proximal femur fracture.  Dominant fracture fragments are in near anatomic alignment and position.  Lesser trochanteric fragment is mildly displaced.  IMPRESSION: Intraoperative fluoroscopic images  during ORIF of a right femur fracture, as above.   Electronically Signed   By: Julian Hy M.D.   On: 02/22/2014 21:52   Pelvis Portable  02/22/2014   CLINICAL DATA:  Postop femur repair  EXAM: PORTABLE PELVIS 1-2 VIEWS  COMPARISON:  None.  FINDINGS: Status post ORIF of the right hip, better described on dedicated right hip/femur radiographs.  Visualized bony pelvis appears intact.  Postsurgical changes involving the lower lumbosacral spine.  Mesh overlying the right lower abdomen.  IMPRESSION: Status post ORIF of the right hip.  Visualized bony pelvis appears intact.  Postsurgical changes involving the lower lumbosacral spine.   Electronically  Signed   By: Julian Hy M.D.   On: 02/22/2014 21:55   Hip Portable 1 View Right  02/22/2014   CLINICAL DATA:  Postop femur repair  EXAM: PORTABLE RIGHT HIP - 1 VIEW  COMPARISON:  None.  FINDINGS: IM nail with dynamic hip screw fixation of a subtrochanteric right hip fracture.  Dominant fracture fragments are in near anatomic alignment and position.  Lesser trochanteric fragment is displaced.  IMPRESSION: Status post ORIF of a right hip fracture, as above.   Electronically Signed   By: Julian Hy M.D.   On: 02/22/2014 21:54   Dg C-arm 61-120 Min  02/22/2014   CLINICAL DATA:  ORIF of right femur fracture  EXAM: DG C-ARM 61-120 MIN; RIGHT FEMUR - 2 VIEW  FLUOROSCOPY TIME:  2 min 45 seconds  COMPARISON:  02/21/2014  FINDINGS: Intraoperative fluoroscopic images during IM nail with dynamic hip screw fixation of a subtrochanteric right proximal femur fracture.  Dominant fracture fragments are in near anatomic alignment and position.  Lesser trochanteric fragment is mildly displaced.  IMPRESSION: Intraoperative fluoroscopic images during ORIF of a right femur fracture, as above.   Electronically Signed   By: Julian Hy M.D.   On: 02/22/2014 21:52    Scheduled Meds: . sodium chloride   Intravenous Once  . sodium chloride   Intravenous Once  . citalopram  10 mg Oral Daily  . docusate sodium  100 mg Oral BID  . enoxaparin (LOVENOX) injection  40 mg Subcutaneous Q24H  . senna  1 tablet Oral BID   Continuous Infusions: . 0.45 % NaCl with KCl 20 mEq / L 75 mL/hr at 02/22/14 2200    Time spent: 15 minutes  [name]  [facility] [email or web address], password [facility] 02/24/2014, 9:32 AM  LOS: 3 days

## 2014-02-24 NOTE — Clinical Social Work Psychosocial (Signed)
Clinical Social Work Department BRIEF PSYCHOSOCIAL ASSESSMENT 02/24/2014  Patient:  [name],[name]     Account Number:  [id number]     Admit date:  02/21/2014  Clinical Social Worker:  , S, LCSWA  Date/Time:  02/24/2014 04:23 PM  Referred by:  Physician  Date Referred:  02/24/2014 Referred for  SNF Placement   Other Referral:   none.   Interview type:  Patient Other interview type:   Pt's daugther and son also present at bedside.    PSYCHOSOCIAL DATA Living Status:  FACILITY Admitted from facility:  [facility] Level of care:  Assisted Living Primary support name:  Keith Brindley Primary support relationship to patient:  CHILD, ADULT Degree of support available:   Strong support system.    CURRENT CONCERNS Current Concerns  Post-Acute Placement   Other Concerns:   none.    SOCIAL WORK ASSESSMENT / PLAN CSW received referral for possible SNF placement at time of discharge. CSW met with pt at bedside to discuss discharge disposition. Pt informed CSW pt is from [facility] and would prefer to complete short-term rehabilitation at [facility]. CSW spoke with admissions liaison for [facility] who verified pt is able to return to [facility] SNF portion at time of discharge. CSW to continue to follow and assist with discharge planning needs.   Assessment/plan status:  Psychosocial Support/Ongoing Assessment of Needs Other assessment/ plan:   none.   Information/referral to community resources:   Pt to return to [facility].    PATIENT'S/FAMILY'S RESPONSE TO PLAN OF CARE: Pt and pt's family understanding and agreeable to CSW plan of care. Pt expressed no further questions or concerns at this time.        , LCSWA ([phone]) Licensed Clinical Social Worker Orthopedics ([id number]) and Surgical ([id number])  

## 2014-02-24 NOTE — Progress Notes (Signed)
Patient ID: [name], female   DOB: 1924/12/18, 80 y.o.   MRN: [id number]     Subjective:  Patient reports pain as mild to moderate.  Patient states that she feels better and did get more rest last night.  Denies any CP or SOB  Objective:   VITALS:   Filed Vitals:   02/23/14 2000 02/24/14 0000 02/24/14 0400 02/24/14 0514  BP:    151/78  Pulse:    95  Temp:    99 F (37.2 C)  TempSrc:      Resp: 16 16 14 16   SpO2: 98% 98% 98% 100%    ABD soft Sensation intact distally Dorsiflexion/Plantar flexion intact Incision: dressing C/D/I and scant drainage Good foot and ankle motion  Lab Results  Component Value Date   WBC 6.1 02/24/2014   HGB 10.6* 02/24/2014   HCT 31.2* 02/24/2014   MCV 97.5 02/24/2014   PLT 134* 02/24/2014   BMET    Component Value Date/Time   NA 140 02/24/2014 0520   K 3.5* 02/24/2014 0520   CL 102 02/24/2014 0520   CO2 28 02/24/2014 0520   GLUCOSE 107* 02/24/2014 0520   BUN 16 02/24/2014 0520   CREATININE 0.62 02/24/2014 0520   CALCIUM 9.1 02/24/2014 0520   GFRNONAA 79* 02/24/2014 0520   GFRAA >90 02/24/2014 0520     Assessment/Plan: 2 Days Post-Op   Principal Problem:   Fracture, subtrochanteric, right femur, closed Active Problems:   Gait disorder   Fall at home   Head injury   Depression   Advance diet Up with therapy WBAT Continue plan per medicine Dry dressing PRN Okay for SNF   Remonia Richter 02/24/2014, 7:19 AM  Seen and agree.  Marchia Bond, MD Cell [phone]

## 2014-02-24 NOTE — Plan of Care (Signed)
Problem: Phase II Progression Outcomes Goal: Tolerating diet Outcome: Completed/Met Date Met:  02/24/14     

## 2014-02-24 NOTE — Progress Notes (Signed)
Patient being discharged to [facility]. Report called to RN Danae Chen at [facility]. Patient being transported to facility via [facility]

## 2014-02-24 NOTE — Evaluation (Signed)
Physical Therapy Evaluation Patient Details Name: [name] MRN: [id number] DOB: [birth year] Today's Date: 02/24/2014   History of Present Illness  80 y.o. female admitted with R subtrochanteric femur fx, s/p IM nail 02/23/14. Pt has h/o approx. 4 falls this past year. She states she always falls backwards and that she's had several sessions of PT to work on her balance.   Clinical Impression  *Pt admitted with R subtrochanteric femur fx, s/p IM nail**. Pt currently with functional limitations due to the deficits listed below (see PT Problem List).  Pt will benefit from skilled PT to increase their independence and safety with mobility to allow discharge to the venue listed below.   Max assist for supine to sit and to pivot to recliner. Pain/fatigue limiting factors with mobility. Pt is pleasant and puts forth good effort.  SNF recommended.   **    Follow Up Recommendations SNF    Equipment Recommendations  None recommended by PT    Recommendations for Other Services OT consult     Precautions / Restrictions Precautions Precautions: Fall Precaution Comments: falls 3-4 this year Restrictions Weight Bearing Restrictions: No      Mobility  Bed Mobility Overal bed mobility: Needs Assistance Bed Mobility: Supine to Sit     Supine to sit: Max assist     General bed mobility comments: assist to advance RLE and to raise trunk, pt 50%  Transfers Overall transfer level: Needs assistance   Transfers: Sit to/from Stand;Stand Pivot Transfers Sit to Stand: Max assist Stand pivot transfers: Mod assist       General transfer comment: assist to rise, assist to pivot with RW, limited by pain  Ambulation/Gait                Stairs            Wheelchair Mobility    Modified Rankin (Stroke Patients Only)       Balance Overall balance assessment: Needs assistance Sitting-balance support: Feet supported;Single extremity supported Sitting balance-Leahy  Scale: Fair     Standing balance support: Bilateral upper extremity supported Standing balance-Leahy Scale: Poor                               Pertinent Vitals/Pain Pain Assessment: Faces Faces Pain Scale: Hurts even more Pain Location: R hip with activity Pain Intervention(s): Limited activity within patient's tolerance;Monitored during session;Premedicated before session;Ice applied    Home Living Family/patient expects to be discharged to:: Other (Comment) (from [place] at [facility]) Living Arrangements: Alone Available Help at Discharge: [place]: One level Home Equipment: Environmental consultant - 4 wheels;Cane - single point      Prior Function Level of Independence: Independent with assistive device(s)         Comments: Independent ADLs, used rollator to go to meals (used basket to carry her tray), otherwise used SPC     Hand Dominance        Extremity/Trunk Assessment   Upper Extremity Assessment: Overall WFL for tasks assessed           Lower Extremity Assessment: RLE deficits/detail RLE Deficits / Details: hip flexion and ABDuction AAROM decreased 50% limited by pain, ankle PF/DF [facility]    Cervical / Trunk Assessment: Kyphotic  Communication   Communication: HOH  Cognition Arousal/Alertness: Awake/alert Behavior During Therapy: WFL for tasks assessed/performed Overall Cognitive Status: Within  Functional Limits for tasks assessed                      General Comments      Exercises Total Joint Exercises Ankle Circles/Pumps: AROM;Both;10 reps;Supine Heel Slides: AAROM;Right;10 reps;Supine Hip ABduction/ADduction: AAROM;Right;10 reps;Supine      Assessment/Plan    PT Assessment Patient needs continued PT services  PT Diagnosis Difficulty walking;Acute pain;Generalized weakness   PT Problem List Decreased strength;Decreased activity tolerance;Decreased balance;Pain;Decreased  knowledge of use of DME;Decreased mobility  PT Treatment Interventions DME instruction;Gait training;Functional mobility training;Therapeutic activities;Patient/family education;Therapeutic exercise   PT Goals (Current goals can be found in the Care Plan section) Acute Rehab PT Goals Patient Stated Goal: to return to [facility] where she likes to play bridge PT Goal Formulation: With patient Time For Goal Achievement: 03/03/14 Potential to Achieve Goals: Good    Frequency Min 4X/week   Barriers to discharge        Co-evaluation               End of Session Equipment Utilized During Treatment: Gait belt Activity Tolerance: Patient limited by fatigue;Patient limited by pain Patient left: in chair;with call bell/phone within reach Nurse Communication: Mobility status         Time: 1031-2811 PT Time Calculation (min) (ACUTE ONLY): 32 min   Charges:   PT Evaluation $Initial PT Evaluation Tier I: 1 Procedure PT Treatments $Therapeutic Exercise: 8-22 mins $Therapeutic Activity: 8-22 mins   PT G Codes:          Philomena Doheny 02/24/2014, 11:03 AM [phone]

## 2014-02-24 NOTE — Clinical Social Work Note (Signed)
Pt to be discharged to [facility] Pt and pt's daughter updated at bedside.  Facility: [facility] Report number: [phone] Transportation: EMS ([place], [phone])  Lubertha Sayres, [place] ([phone]) Licensed Clinical Social Worker Orthopedics [phone]) and Surgical [phone])

## 2014-02-24 NOTE — Evaluation (Signed)
Occupational Therapy Evaluation Patient Details Name: [name] MRN: [id number] DOB: August 17, 1924 Today's Date: 02/24/2014    History of Present Illness 80 y.o. female admitted with R subtrochanteric femur fx, s/p IM nail 02/23/14. Pt has h/o approx. 4 falls this past year. She states she always falls backwards and that she's had several sessions of PT to work on her balance.    Clinical Impression   Pt was modified independent in self care prior to admission, assisted for some meals and housekeeping.  Pt presents with pain and impaired balance interfering with ability to perform ADL and ADL transfers.  She required max assist for stand pivot transfer and total assist for LB ADL.  Will defer further OT to SNF.    Follow Up Recommendations  SNF;Supervision/Assistance - 24 hour    Equipment Recommendations       Recommendations for Other Services       Precautions / Restrictions Precautions Precautions: Fall Precaution Comments: falls 3-4 this year Restrictions Weight Bearing Restrictions: No      Mobility Bed Mobility               General bed mobility comments: pt in chair, returned to chair  Transfers Overall transfer level: Needs assistance   Transfers: Stand Pivot Transfers Sit to Stand: Max assist Stand pivot transfers: Max assist       General transfer comment: assist to rise, assist to pivot, limited by pain    Balance                                            ADL Overall ADL's : Needs assistance/impaired Eating/Feeding: Independent;Sitting   Grooming: Wash/dry hands;Wash/dry face;Sitting;Set up   Upper Body Bathing: Minimal assitance;Sitting   Lower Body Bathing: Total assistance;Sit to/from stand   Upper Body Dressing : Minimal assistance;Sitting   Lower Body Dressing: Total assistance;Sit to/from stand   Toilet Transfer: Maximal assistance;Stand-pivot;BSC   Toileting- Clothing Manipulation and Hygiene: Total  assistance;Sit to/from stand               Vision                     Perception     Praxis      Pertinent Vitals/Pain Pain Assessment: Faces Faces Pain Scale: Hurts even more Pain Location: R hip with transfer Pain Descriptors / Indicators: Aching Pain Intervention(s): Limited activity within patient's tolerance;Premedicated before session;Monitored during session;Repositioned     Hand Dominance Right   Extremity/Trunk Assessment Upper Extremity Assessment Upper Extremity Assessment: Overall WFL for tasks assessed   Lower Extremity Assessment Lower Extremity Assessment: Defer to PT evaluation   Cervical / Trunk Assessment Cervical / Trunk Assessment: Kyphotic   Communication Communication Communication: HOH   Cognition Arousal/Alertness: Awake/alert Behavior During Therapy: WFL for tasks assessed/performed Overall Cognitive Status: Within Functional Limits for tasks assessed                     General Comments       Exercises       Shoulder Instructions      Home Living Family/patient expects to be discharged to:: Skilled nursing facility Living Arrangements: Alone Available Help at Discharge: [place]: One level  Home Equipment: [place] - 4 wheels;Cane - single point          Prior Functioning/Environment Level of Independence: Independent with assistive device(s)        Comments: Independent ADLs, used rollator to go to meals (used basket to carry her tray), otherwise used SPC    OT Diagnosis: Generalized weakness;Acute pain   OT Problem List: Decreased strength;Impaired balance (sitting and/or standing);Decreased activity tolerance;Impaired UE functional use;Decreased knowledge of use of DME or AE   OT Treatment/Interventions:      OT Goals(Current goals can be found in the care plan section) Acute Rehab OT Goals Patient Stated Goal: to return to [facility]  where she likes to play bridge  OT Frequency:     Barriers to D/C:            Co-evaluation              End of Session Equipment Utilized During Treatment: Gait belt  Activity Tolerance: Patient limited by pain Patient left: in chair;with call bell/phone within reach   Time: 1330-1350 OT Time Calculation (min): 20 min Charges:  OT General Charges $OT Visit: 1 Procedure OT Evaluation $Initial OT Evaluation Tier I: 1 Procedure OT Treatments $Self Care/Home Management : 8-22 mins G-Codes:    Malka So 02/24/2014, 4:24 PM

## 2014-02-25 ENCOUNTER — Encounter: Payer: Self-pay | Admitting: Nurse Practitioner

## 2014-02-25 ENCOUNTER — Non-Acute Institutional Stay (SKILLED_NURSING_FACILITY): Payer: Medicare Other | Admitting: Nurse Practitioner

## 2014-02-25 DIAGNOSIS — S7221XS Displaced subtrochanteric fracture of right femur, sequela: Secondary | ICD-10-CM

## 2014-02-25 DIAGNOSIS — D62 Acute posthemorrhagic anemia: Secondary | ICD-10-CM | POA: Diagnosis not present

## 2014-02-25 DIAGNOSIS — R011 Cardiac murmur, unspecified: Secondary | ICD-10-CM

## 2014-02-25 DIAGNOSIS — K219 Gastro-esophageal reflux disease without esophagitis: Secondary | ICD-10-CM

## 2014-02-25 DIAGNOSIS — K5900 Constipation, unspecified: Secondary | ICD-10-CM | POA: Insufficient documentation

## 2014-02-25 DIAGNOSIS — F32A Depression, unspecified: Secondary | ICD-10-CM

## 2014-02-25 DIAGNOSIS — I679 Cerebrovascular disease, unspecified: Secondary | ICD-10-CM

## 2014-02-25 DIAGNOSIS — F329 Major depressive disorder, single episode, unspecified: Secondary | ICD-10-CM | POA: Diagnosis not present

## 2014-02-25 NOTE — Assessment & Plan Note (Signed)
Resume home PPI-Omeprazole.

## 2014-02-25 NOTE — Assessment & Plan Note (Signed)
Post op,s/p 2 units, Hgb 10s upon hospital discharge, will update CBC, CMP, TSH

## 2014-02-25 NOTE — Assessment & Plan Note (Signed)
Echocardiogram to evaluate further.

## 2014-02-25 NOTE — Assessment & Plan Note (Signed)
Takes Senokot II hs,

## 2014-02-25 NOTE — Progress Notes (Signed)
Patient ID: [name], female   DOB: 29-Jan-1925, 80 y.o.   MRN: [id number]   Code Status: DNR  Allergies  Allergen Reactions  . Novocain [Procaine]     Passed  out  . Limbrel [Flavocoxid]     Dizziness   . Sertraline Anxiety    Chief Complaint  Patient presents with  . Medical Management of Chronic Issues  . Acute Visit    HPI: Patient is a 80 y.o. female seen in the SNF at [facility] today for evaluation of R hip pain-s/p ORIF for fx  and other chronic medical conditions.    Hospitalized from 02/21/2014-02/24/2014 for R hip fx sustained from mechanical fall at home,  s/p ORIF(IM nail). 2 units RRBC transfused, Hgb 10s upon discharge. Lovenox duration will be determined by Ortho.    Problem List Items Addressed This Visit    Small vessel disease, cerebrovascular    F/u Dr. Jannifer Franklin, previous MRI. Continue ASA    Heart murmur on physical examination    Echocardiogram to evaluate further.     GERD (gastroesophageal reflux disease)    Resume home PPI-Omeprazole.     Fracture, subtrochanteric, right femur, closed - Primary    Fracture, subtrochanteric, right femur, closed, s/p ORIF, Lovenox 30mg  daily-Ortho will determine the duration. Takes Tylenol 650mg  bid, Norco 1-2 q6 prn for pain.       Depression    Hx of depression-current not taking antidepressant-will resume home Celexa    Constipation    Takes Senokot II hs,     Acute blood loss anemia    Post op,s/p 2 units, Hgb 10s upon hospital discharge, will update CBC, CMP, TSH       Review of Systems:  Review of Systems  Constitutional: Positive for malaise/fatigue. Negative for fever, chills, weight loss and diaphoresis.       Generalized weakness post op  HENT: Positive for hearing loss. Negative for congestion, ear discharge, ear pain, nosebleeds, sore throat and tinnitus.   Eyes: Negative for blurred vision, double vision, photophobia, pain, discharge and redness.  Respiratory: Negative for cough,  hemoptysis, sputum production, shortness of breath, wheezing and stridor.   Cardiovascular: Negative for chest pain, palpitations, orthopnea, claudication, leg swelling and PND.       Mild in the RLE  Gastrointestinal: Negative for heartburn, nausea, vomiting, abdominal pain, diarrhea, constipation, blood in stool and melena.  Genitourinary: Negative for dysuria, urgency, frequency, hematuria and flank pain.  Musculoskeletal: Positive for joint pain and falls. Negative for myalgias, back pain and neck pain.       Right hip s/p ORIF-pain with ROM and weight bearing.   Skin: Negative for itching and rash.       R hip surgical incisions intact and no peri-wound cellulitis except mild inflammation.   Neurological: Negative for dizziness, tingling, tremors, sensory change, speech change, focal weakness, seizures, loss of consciousness, weakness and headaches.  Endo/Heme/Allergies: Negative for environmental allergies and polydipsia. Does not bruise/bleed easily.  Psychiatric/Behavioral: Positive for depression. Negative for suicidal ideas, hallucinations, memory loss and substance abuse. The patient is not nervous/anxious and does not have insomnia.      Past Medical History  Diagnosis Date  . Pain in joint, upper arm   . Disturbance of skin sensation   . Other abnormal clinical finding   . Disturbance of skin sensation   . Unspecified vitamin D deficiency   . Tobacco use disorder   . Chronic airway obstruction, not elsewhere classified   .  Hypertension   . GERD (gastroesophageal reflux disease)   . Anemia     iron deficient  . Chronic back pain   . Chronic cystitis   . Hernia   . Depression   . Anxiety   . Essential and other specified forms of tremor 12/11/2012  . Osteoporosis   . Degenerative arthritis   . History of cerebrovascular disease     Moderate level small vessel disease  . Gait disorder   . Dyslipidemia   . Osteoarthritis     right knee injection per Dr Trudie Reed  .  Hyperlipidemia     mild  . Peripheral vascular disease   . Hemorrhoids   . Esophageal reflux   . Dyspnea     Dr Chase Caller  . Rheumatoid arthritis     Dr Trudie Reed  . Cystitis, chronic     Dr Gaynelle Arabian  . Tremor     and gait disorder--Dr Jannifer Franklin  . Foot drop, bilateral 12/21/2013   Past Surgical History  Procedure Laterality Date  . Back surgery Bilateral     X2  . Appendectomy    . Cataract extraction Bilateral   . Bladder resuspension procedure    . Tonsillectomy    . Inguinal hernia repair Bilateral    Social History:   reports that she has never smoked. She has never used smokeless tobacco. She reports that she does not drink alcohol or use illicit drugs.  Family History  Problem Relation Age of Onset  . Heart Problems Mother   . Diabetes Father   . Heart attack Father   . Cancer Sister     Brain tumor  . Emphysema Brother     Medications: Patient's Medications  New Prescriptions   No medications on file  Previous Medications   ACETAMINOPHEN (TYLENOL) 650 MG CR TABLET    Take 650 mg by mouth 2 (two) times daily.   CALCIUM CARBONATE-VITAMIN D (CALTRATE 600+D) 600-400 MG-UNIT PER TABLET    Take 1 tablet by mouth daily.   CHOLECALCIFEROL (VITAMIN D) 1000 UNITS TABLET    Take 1,000 Units by mouth daily.   DICLOFENAC SODIUM (VOLTAREN) 1 % GEL    Apply 4 g topically 4 (four) times daily.   ENOXAPARIN (LOVENOX) 300 MG/3ML SOLN INJECTION    Inject 0.3 mLs (30 mg total) into the skin daily.   HYDROCODONE-ACETAMINOPHEN (NORCO) 5-325 MG PER TABLET    Take 1-2 tablets by mouth every 6 (six) hours as needed for moderate pain. MAXIMUM TOTAL ACETAMINOPHEN DOSE IS 4000 MG PER DAY   MISC NATURAL PRODUCTS (OSTEO BI-FLEX JOINT SHIELD) TABS    Take 1 tablet by mouth daily.   MULTIPLE VITAMIN (MULTIVITAMIN) TABLET    Take 1 tablet by mouth daily.   SENNOSIDES-DOCUSATE SODIUM (SENOKOT-S) 8.6-50 MG TABLET    Take 2 tablets by mouth daily.  Modified Medications   No medications on file    Discontinued Medications   No medications on file     Physical Exam: Physical Exam  Constitutional: She is oriented to person, place, and time. She appears well-developed and well-nourished.  HENT:  Head: Normocephalic and atraumatic.  Right Ear: External ear normal.  Left Ear: External ear normal.  Nose: Nose normal.  Mouth/Throat: Oropharynx is clear and moist. No oropharyngeal exudate.  Eyes: Conjunctivae and EOM are normal. Pupils are equal, round, and reactive to light. Right eye exhibits no discharge. Left eye exhibits no discharge. No scleral icterus.  Neck: Normal range of motion. Neck supple. No  JVD present. No tracheal deviation present. No thyromegaly present.  Cardiovascular: Normal rate and regular rhythm.  Exam reveals no friction rub.   Murmur heard.  Systolic murmur is present with a grade of 2/6  Pulmonary/Chest: Effort normal and breath sounds normal. No stridor. No respiratory distress. She has no wheezes. She has no rales. She exhibits no tenderness.  Abdominal: Soft. She exhibits no distension and no mass. There is no tenderness. There is no rebound and no guarding.  Musculoskeletal: She exhibits edema and tenderness.  RLE trace. Pain in the R hip with ROM and weight bearing/  Lymphadenopathy:    She has no cervical adenopathy.  Neurological: She is alert and oriented to person, place, and time. She has normal reflexes. She displays normal reflexes. No cranial nerve deficit. She exhibits normal muscle tone. Coordination normal.  Skin: Skin is warm and dry. No rash noted. No erythema. No pallor.  Surgical wounds intact above the great trochanter and upper R femur.   Psychiatric: She has a normal mood and affect. Her behavior is normal. Judgment and thought content normal.   (type .physexam) Filed Vitals:   02/25/14 1350  BP: 136/60  Pulse: 86  Temp: 98.1 F (36.7 C)  TempSrc: Tympanic  Resp: 20      Labs reviewed: Basic Metabolic Panel:  Recent  Labs  12/21/13 1440  02/22/14 0525 02/23/14 0433 02/24/14 0520 02/28/14  NA  --   < > 144 144 140 140  K  --   < > 4.1 4.0 3.5* 4.4  CL  --   < > 105 105 102  --   CO2  --   < > 29 30 28   --   GLUCOSE  --   < > 182* 128* 107*  --   BUN  --   < > 23 22 16 18   CREATININE  --   < > 0.85 0.79 0.62 0.7  CALCIUM  --   < > 9.3 8.8 9.1  --   TSH 1.040  --   --   --   --  2.13  < > = values in this interval not displayed. Liver Function Tests:  Recent Labs  02/28/14  AST 38*  ALT 17  ALKPHOS 44   No results for input(s): LIPASE, AMYLASE in the last 8760 hours. No results for input(s): AMMONIA in the last 8760 hours. CBC:  Recent Labs  02/21/14 1734 02/22/14 0525 02/23/14 0433 02/24/14 0520 02/28/14  WBC 7.6 10.3 9.2 6.1 9.6  NEUTROABS 5.6  --   --   --   --   HGB 14.0 11.1* 8.6* 10.6* 11.8*  HCT 42.9 33.9* 26.2* 31.2* 36  MCV 102.9* 104.0* 103.6* 97.5  --   PLT 209 195 179 134* 368   Lipid Panel: No results for input(s): CHOL, HDL, LDLCALC, TRIG, CHOLHDL, LDLDIRECT in the last 8760 hours.  Past Procedures:  02/21/14 X-ray R shoulder:  IMPRESSION: 1. Scapular fracture, which is across the scapular spine. This may also involve the base of the coracoid process. There is sclerosis along the fracture margins of the fracture scapular spine suggesting that this is chronic or subacute. 2. No other fractures. No dislocation. Chronic full-thickness rotator cuff tear.   02/21/14 R hip  IMPRESSION: Comminuted intertrochanteric fracture with extension into the proximal femoral shaft.   02/21/14 R femur:  IMPRESSION: Comminuted, displaced fracture of the proximal right femur as described. No dislocation.  02/21/14 CXR   IMPRESSION: 1. No active  cardiopulmonary abnormalities. 2. Age indeterminate scapular fracture. Recommend dedicated images of the right shoulder and scapula.   02/21/14 CT head:  IMPRESSION: 1. No acute intracranial abnormalities. 2. Advanced,  chronic microvascular ischemic disease.    Assessment/Plan Fracture, subtrochanteric, right femur, closed Fracture, subtrochanteric, right femur, closed, s/p ORIF, Lovenox 30mg  daily-Ortho will determine the duration. Takes Tylenol 650mg  bid, Norco 1-2 q6 prn for pain.     Constipation Takes Senokot II hs,   Depression Hx of depression-current not taking antidepressant-will resume home Celexa  Acute blood loss anemia Post op,s/p 2 units, Hgb 10s upon hospital discharge, will update CBC, CMP, TSH  Small vessel disease, cerebrovascular F/u Dr. Jannifer Franklin, previous MRI. Continue ASA  GERD (gastroesophageal reflux disease) Resume home PPI-Omeprazole.   Heart murmur on physical examination Echocardiogram to evaluate further.     Family/ Staff Communication: observe the patient.   Goals of Care: SNF  Labs/tests ordered: CBC, CMP, TSH, echocardiogram.

## 2014-02-25 NOTE — Assessment & Plan Note (Addendum)
Hx of depression-current not taking antidepressant-will resume home Celexa

## 2014-02-25 NOTE — Assessment & Plan Note (Addendum)
F/u Dr. Jannifer Franklin, previous MRI. Continue ASA

## 2014-02-25 NOTE — Assessment & Plan Note (Addendum)
Fracture, subtrochanteric, right femur, closed, s/p ORIF, Lovenox 30mg  daily-Ortho will determine the duration. Takes Tylenol 650mg  bid, Norco 1-2 q6 prn for pain.

## 2014-02-28 LAB — CBC AND DIFFERENTIAL
HEMATOCRIT: 36 % (ref 36–46)
HEMOGLOBIN: 11.8 g/dL — AB (ref 12.0–16.0)
Platelets: 368 10*3/uL (ref 150–399)
WBC: 9.6 10^3/mL

## 2014-02-28 LAB — BASIC METABOLIC PANEL
BUN: 18 mg/dL (ref 4–21)
Creatinine: 0.7 mg/dL (ref 0.5–1.1)
Potassium: 4.4 mmol/L (ref 3.4–5.3)
Sodium: 140 mmol/L (ref 137–147)

## 2014-02-28 LAB — HEPATIC FUNCTION PANEL
ALT: 17 U/L (ref 7–35)
AST: 38 U/L — AB (ref 13–35)
Alkaline Phosphatase: 44 U/L (ref 25–125)
BILIRUBIN, TOTAL: 1.2 mg/dL

## 2014-02-28 LAB — TSH: TSH: 2.13 u[IU]/mL (ref 0.41–5.90)

## 2014-03-01 ENCOUNTER — Encounter (HOSPITAL_COMMUNITY): Payer: Self-pay | Admitting: Orthopedic Surgery

## 2014-03-01 DIAGNOSIS — S7221XD Displaced subtrochanteric fracture of right femur, subsequent encounter for closed fracture with routine healing: Secondary | ICD-10-CM | POA: Diagnosis not present

## 2014-03-03 LAB — CBC AND DIFFERENTIAL
HCT: 36 % (ref 36–46)
Hemoglobin: 12.1 g/dL (ref 12.0–16.0)
PLATELETS: 436 10*3/uL — AB (ref 150–399)
WBC: 9.5 10*3/mL

## 2014-03-04 ENCOUNTER — Other Ambulatory Visit: Payer: Self-pay | Admitting: Nurse Practitioner

## 2014-03-04 DIAGNOSIS — D62 Acute posthemorrhagic anemia: Secondary | ICD-10-CM

## 2014-03-22 ENCOUNTER — Non-Acute Institutional Stay (SKILLED_NURSING_FACILITY): Payer: Medicare Other | Admitting: Nurse Practitioner

## 2014-03-22 DIAGNOSIS — K219 Gastro-esophageal reflux disease without esophagitis: Secondary | ICD-10-CM

## 2014-03-22 DIAGNOSIS — M79661 Pain in right lower leg: Secondary | ICD-10-CM

## 2014-03-22 DIAGNOSIS — F329 Major depressive disorder, single episode, unspecified: Secondary | ICD-10-CM

## 2014-03-22 DIAGNOSIS — R011 Cardiac murmur, unspecified: Secondary | ICD-10-CM | POA: Diagnosis not present

## 2014-03-22 DIAGNOSIS — K5900 Constipation, unspecified: Secondary | ICD-10-CM

## 2014-03-22 DIAGNOSIS — D62 Acute posthemorrhagic anemia: Secondary | ICD-10-CM | POA: Diagnosis not present

## 2014-03-22 DIAGNOSIS — M25561 Pain in right knee: Secondary | ICD-10-CM

## 2014-03-22 DIAGNOSIS — S7221XS Displaced subtrochanteric fracture of right femur, sequela: Secondary | ICD-10-CM | POA: Diagnosis not present

## 2014-03-22 DIAGNOSIS — F32A Depression, unspecified: Secondary | ICD-10-CM

## 2014-03-22 DIAGNOSIS — M25551 Pain in right hip: Secondary | ICD-10-CM | POA: Insufficient documentation

## 2014-03-22 NOTE — Assessment & Plan Note (Addendum)
02/21/14 R femur: right subtrochanteric femur fracture s/p IM nail.   IMPRESSION: Comminuted, displaced fracture of the proximal right femur as described. No dislocation.  03/01/14 Ortho: looks good RTC 4 wks. WBAT.   03/22/14 ambulates with walker  03/08/14 bone density test. Taking Ca and Vit D presently.   03/18/13 RLE Venous Doppler: negative for deep venous thrombosis at major veins of right lower extremity.   03/22/13 noticeable RLE edema. BLE scaly and mild erythema. C/o soreness in the right ankle-able to bear weight and ambulates w/o excessive pain. The right forefoot ecchymosis-better per the patient-able to wiggle toes/pulling up/push down w/o pain. Voltaren, Norco prn. Tylenol 650mg  bid

## 2014-03-22 NOTE — Assessment & Plan Note (Signed)
Hx of depression, stable, takes Celexa 10mg 

## 2014-03-22 NOTE — Assessment & Plan Note (Signed)
02/28/14 echocardiogram: left atrial enlargement, trivial mitral valve regurgitation, moderate tricuspid valve regurgitation, mild pulmonic valve insufficiency.

## 2014-03-22 NOTE — Assessment & Plan Note (Signed)
Resume home PPI-Omeprazole. Stable.

## 2014-03-22 NOTE — Progress Notes (Signed)
Patient ID: [name], female   DOB: 02-04-1925, 81 y.o.   MRN: [id number]   Code Status: DNR  Allergies  Allergen Reactions  . Novocain [Procaine]     Passed  out  . Limbrel [Flavocoxid]     Dizziness   . Sertraline Anxiety    Chief Complaint  Patient presents with  . Medical Management of Chronic Issues  . Acute Visit    swelling RLE and bruised right fore foot.     HPI: Patient is a 81 y.o. female seen in the SNF at [facility] today for evaluation of RLE edema/R ankle soreness/R forefoot bruise and other chronic medical conditions.    Hospitalized from 02/21/2014-02/24/2014 for R hip fx sustained from mechanical fall at home,  s/p ORIF(IM nail). 2 units RRBC transfused, Hgb 10s upon discharge. Lovenox duration will be determined by Ortho.    Problem List Items Addressed This Visit    Pain of right knee and lower leg    Right knee/ankle soreness, swelling in from knee down, forefoot ecchymosis but no pain with weight bearing or pulling up/push down the R foot. Negative for DVT per venous doppler. Voltaren gel and Norco prn for pain. Tylenol bid for now.     Heart murmur on physical examination    02/28/14 echocardiogram: left atrial enlargement, trivial mitral valve regurgitation, moderate tricuspid valve regurgitation, mild pulmonic valve insufficiency.      GERD (gastroesophageal reflux disease)    Resume home PPI-Omeprazole. Stable.      Relevant Medications      omeprazole (PRILOSEC) 20 MG capsule   Fracture, subtrochanteric, right femur, closed - Primary    02/21/14 R femur: right subtrochanteric femur fracture s/p IM nail.   IMPRESSION: Comminuted, displaced fracture of the proximal right femur as described. No dislocation.  03/01/14 Ortho: looks good RTC 4 wks. WBAT.   03/22/14 ambulates with walker  03/08/14 bone density test. Taking Ca and Vit D presently.   03/18/13 RLE Venous Doppler: negative for deep venous thrombosis at major veins of right  lower extremity.   03/22/13 noticeable RLE edema. BLE scaly and mild erythema. C/o soreness in the right ankle-able to bear weight and ambulates w/o excessive pain. The right forefoot ecchymosis-better per the patient-able to wiggle toes/pulling up/push down w/o pain. Voltaren, Norco prn. Tylenol 650mg  bid        Depression    Hx of depression, stable, takes Celexa 10mg         Relevant Medications      citalopram (CELEXA) 10 MG tablet   Constipation    Takes Senokot II hs     Acute blood loss anemia    02/28/14 Hgb 11.8.  03/03/14 Hgb 12.1        Review of Systems:  Review of Systems  Constitutional: Negative for fever, chills, weight loss, malaise/fatigue and diaphoresis.       Generalized weakness post op  HENT: Positive for hearing loss. Negative for congestion, ear discharge, ear pain, nosebleeds, sore throat and tinnitus.   Eyes: Negative for blurred vision, double vision, photophobia, pain, discharge and redness.  Respiratory: Negative for cough, hemoptysis, sputum production, shortness of breath, wheezing and stridor.   Cardiovascular: Negative for chest pain, palpitations, orthopnea, claudication, leg swelling and PND.       Mild in the RLE  Gastrointestinal: Negative for heartburn, nausea, vomiting, abdominal pain, diarrhea, constipation, blood in stool and melena.  Genitourinary: Negative for dysuria, urgency, frequency, hematuria and flank pain.  Musculoskeletal:  Positive for joint pain and falls. Negative for myalgias, back pain and neck pain.       Right hip s/p ORIF-pain with ROM and weight bearing.   Skin: Negative for itching and rash.       R hip surgical incisions healed, noticeable RLE edema. BLE scaly and mild erythema. C/o soreness in the right ankle-able to bear weight and ambulates w/o excessive pain. The right forefoot ecchymosis-better per the patient-able to wiggle toes/pulling up/push down w/o pain.    Neurological: Negative for dizziness, tingling,  tremors, sensory change, speech change, focal weakness, seizures, loss of consciousness, weakness and headaches.  Endo/Heme/Allergies: Negative for environmental allergies and polydipsia. Does not bruise/bleed easily.  Psychiatric/Behavioral: Positive for depression. Negative for suicidal ideas, hallucinations, memory loss and substance abuse. The patient is not nervous/anxious and does not have insomnia.      Past Medical History  Diagnosis Date  . Pain in joint, upper arm   . Disturbance of skin sensation   . Other abnormal clinical finding   . Disturbance of skin sensation   . Unspecified vitamin D deficiency   . Tobacco use disorder   . Chronic airway obstruction, not elsewhere classified   . Hypertension   . GERD (gastroesophageal reflux disease)   . Anemia     iron deficient  . Chronic back pain   . Chronic cystitis   . Hernia   . Depression   . Anxiety   . Essential and other specified forms of tremor 12/11/2012  . Osteoporosis   . Degenerative arthritis   . History of cerebrovascular disease     Moderate level small vessel disease  . Gait disorder   . Dyslipidemia   . Osteoarthritis     right knee injection per Dr Trudie Reed  . Hyperlipidemia     mild  . Peripheral vascular disease   . Hemorrhoids   . Esophageal reflux   . Dyspnea     Dr Chase Caller  . Rheumatoid arthritis     Dr Trudie Reed  . Cystitis, chronic     Dr Gaynelle Arabian  . Tremor     and gait disorder--Dr Jannifer Franklin  . Foot drop, bilateral 12/21/2013   Past Surgical History  Procedure Laterality Date  . Back surgery Bilateral     X2  . Appendectomy    . Cataract extraction Bilateral   . Bladder resuspension procedure    . Tonsillectomy    . Inguinal hernia repair Bilateral   . Femur im nail Right 02/22/2014    Procedure: INTRAMEDULLARY (IM) NAIL FEMORAL;  Surgeon: Johnny Bridge, MD;  Location: [place];  Service: Orthopedics;  Laterality: Right;   Social History:   reports that she has never smoked. She has  never used smokeless tobacco. She reports that she does not drink alcohol or use illicit drugs.  Family History  Problem Relation Age of Onset  . Heart Problems Mother   . Diabetes Father   . Heart attack Father   . Cancer Sister     Brain tumor  . Emphysema Brother     Medications: Patient's Medications  New Prescriptions   No medications on file  Previous Medications   ACETAMINOPHEN (TYLENOL) 650 MG CR TABLET    Take 650 mg by mouth 2 (two) times daily.   CALCIUM CARBONATE-VITAMIN D (CALTRATE 600+D) 600-400 MG-UNIT PER TABLET    Take 1 tablet by mouth daily.   CHOLECALCIFEROL (VITAMIN D) 1000 UNITS TABLET    Take 1,000 Units by mouth  daily.   CITALOPRAM (CELEXA) 10 MG TABLET    Take 10 mg by mouth daily.   DICLOFENAC SODIUM (VOLTAREN) 1 % GEL    Apply 4 g topically 4 (four) times daily.   ENOXAPARIN (LOVENOX) 300 MG/3ML SOLN INJECTION    Inject 0.3 mLs (30 mg total) into the skin daily.   HYDROCODONE-ACETAMINOPHEN (NORCO) 5-325 MG PER TABLET    Take 1-2 tablets by mouth every 6 (six) hours as needed for moderate pain. MAXIMUM TOTAL ACETAMINOPHEN DOSE IS 4000 MG PER DAY   MISC NATURAL PRODUCTS (OSTEO BI-FLEX JOINT SHIELD) TABS    Take 1 tablet by mouth daily.   MULTIPLE VITAMIN (MULTIVITAMIN) TABLET    Take 1 tablet by mouth daily.   OMEPRAZOLE (PRILOSEC) 20 MG CAPSULE    Take 20 mg by mouth daily.   SENNOSIDES-DOCUSATE SODIUM (SENOKOT-S) 8.6-50 MG TABLET    Take 2 tablets by mouth daily.  Modified Medications   No medications on file  Discontinued Medications   No medications on file     Physical Exam: Physical Exam  Constitutional: She is oriented to person, place, and time. She appears well-developed and well-nourished.  HENT:  Head: Normocephalic and atraumatic.  Right Ear: External ear normal.  Left Ear: External ear normal.  Nose: Nose normal.  Mouth/Throat: Oropharynx is clear and moist. No oropharyngeal exudate.  Eyes: Conjunctivae and EOM are normal. Pupils are  equal, round, and reactive to light. Right eye exhibits no discharge. Left eye exhibits no discharge. No scleral icterus.  Neck: Normal range of motion. Neck supple. No JVD present. No tracheal deviation present. No thyromegaly present.  Cardiovascular: Normal rate and regular rhythm.  Exam reveals no friction rub.   Murmur heard.  Systolic murmur is present with a grade of 2/6  Pulmonary/Chest: Effort normal and breath sounds normal. No stridor. No respiratory distress. She has no wheezes. She has no rales. She exhibits no tenderness.  Abdominal: Soft. She exhibits no distension and no mass. There is no tenderness. There is no rebound and no guarding.  Musculoskeletal: She exhibits edema and tenderness.  RLE trace. Pain in the R hip with ROM and weight bearing/  Lymphadenopathy:    She has no cervical adenopathy.  Neurological: She is alert and oriented to person, place, and time. She has normal reflexes. No cranial nerve deficit. She exhibits normal muscle tone. Coordination normal.  Skin: Skin is warm and dry. No rash noted. No erythema. No pallor.  Surgical wounds healed, noticeable RLE edema. BLE scaly and mild erythema. C/o soreness in the right ankle-able to bear weight and ambulates w/o excessive pain. The right forefoot ecchymosis-better per the patient-able to wiggle toes/pulling up/push down w/o pain.    Psychiatric: She has a normal mood and affect. Her behavior is normal. Judgment and thought content normal.   (type .physexam) Filed Vitals:   03/22/14 1029  BP: 142/70  Pulse: 96  Temp: 98.5 F (36.9 C)  TempSrc: Tympanic  Resp: 20      Labs reviewed: Basic Metabolic Panel:  Recent Labs  12/21/13 1440  02/22/14 0525 02/23/14 0433 02/24/14 0520 02/28/14  NA  --   < > 144 144 140 140  K  --   < > 4.1 4.0 3.5* 4.4  CL  --   < > 105 105 102  --   CO2  --   < > 29 30 28   --   GLUCOSE  --   < > 182* 128* 107*  --  BUN  --   < > 23 22 16 18   CREATININE  --   < >  0.85 0.79 0.62 0.7  CALCIUM  --   < > 9.3 8.8 9.1  --   TSH 1.040  --   --   --   --  2.13  < > = values in this interval not displayed. Liver Function Tests:  Recent Labs  02/28/14  AST 38*  ALT 17  ALKPHOS 44   No results for input(s): LIPASE, AMYLASE in the last 8760 hours. No results for input(s): AMMONIA in the last 8760 hours. CBC:  Recent Labs  02/21/14 1734 02/22/14 0525 02/23/14 0433 02/24/14 0520 02/28/14 03/03/14  WBC 7.6 10.3 9.2 6.1 9.6 9.5  NEUTROABS 5.6  --   --   --   --   --   HGB 14.0 11.1* 8.6* 10.6* 11.8* 12.1  HCT 42.9 33.9* 26.2* 31.2* 36 36  MCV 102.9* 104.0* 103.6* 97.5  --   --   PLT 209 195 179 134* 368 436*   Lipid Panel: No results for input(s): CHOL, HDL, LDLCALC, TRIG, CHOLHDL, LDLDIRECT in the last 8760 hours.  Past Procedures:  02/21/14 X-ray R shoulder:  IMPRESSION: 1. Scapular fracture, which is across the scapular spine. This may also involve the base of the coracoid process. There is sclerosis along the fracture margins of the fracture scapular spine suggesting that this is chronic or subacute. 2. No other fractures. No dislocation. Chronic full-thickness rotator cuff tear.   02/21/14 R hip  IMPRESSION: Comminuted intertrochanteric fracture with extension into the proximal femoral shaft.   02/21/14 R femur:  IMPRESSION: Comminuted, displaced fracture of the proximal right femur as described. No dislocation.  02/21/14 CXR   IMPRESSION: 1. No active cardiopulmonary abnormalities. 2. Age indeterminate scapular fracture. Recommend dedicated images of the right shoulder and scapula.   02/21/14 CT head:  IMPRESSION: 1. No acute intracranial abnormalities. 2. Advanced, chronic microvascular ischemic disease.    Assessment/Plan Fracture, subtrochanteric, right femur, closed 02/21/14 R femur: right subtrochanteric femur fracture s/p IM nail.   IMPRESSION: Comminuted, displaced fracture of the proximal right femur  as described. No dislocation.  03/01/14 Ortho: looks good RTC 4 wks. WBAT.   03/22/14 ambulates with walker  03/08/14 bone density test. Taking Ca and Vit D presently.   03/18/13 RLE Venous Doppler: negative for deep venous thrombosis at major veins of right lower extremity.   03/22/13 noticeable RLE edema. BLE scaly and mild erythema. C/o soreness in the right ankle-able to bear weight and ambulates w/o excessive pain. The right forefoot ecchymosis-better per the patient-able to wiggle toes/pulling up/push down w/o pain. Voltaren, Norco prn. Tylenol 650mg  bid      Heart murmur on physical examination 02/28/14 echocardiogram: left atrial enlargement, trivial mitral valve regurgitation, moderate tricuspid valve regurgitation, mild pulmonic valve insufficiency.    GERD (gastroesophageal reflux disease) Resume home PPI-Omeprazole. Stable.    Depression Hx of depression, stable, takes Celexa 10mg       Constipation Takes Senokot II hs   Acute blood loss anemia 02/28/14 Hgb 11.8.  03/03/14 Hgb 12.1   Pain of right knee and lower leg Right knee/ankle soreness, swelling in from knee down, forefoot ecchymosis but no pain with weight bearing or pulling up/push down the R foot. Negative for DVT per venous doppler. Voltaren gel and Norco prn for pain. Tylenol bid for now.     Family/ Staff Communication: observe the patient.   Goals of Care: SNF  Labs/tests ordered: none

## 2014-03-22 NOTE — Assessment & Plan Note (Signed)
02/28/14 Hgb 11.8.  03/03/14 Hgb 12.1

## 2014-03-22 NOTE — Assessment & Plan Note (Signed)
Right knee/ankle soreness, swelling in from knee down, forefoot ecchymosis but no pain with weight bearing or pulling up/push down the R foot. Negative for DVT per venous doppler. Voltaren gel and Norco prn for pain. Tylenol bid for now.

## 2014-03-22 NOTE — Assessment & Plan Note (Signed)
Takes Senokot II hs

## 2014-03-28 DIAGNOSIS — M25562 Pain in left knee: Secondary | ICD-10-CM | POA: Diagnosis not present

## 2014-03-28 DIAGNOSIS — S7221XD Displaced subtrochanteric fracture of right femur, subsequent encounter for closed fracture with routine healing: Secondary | ICD-10-CM | POA: Diagnosis not present

## 2014-03-29 ENCOUNTER — Other Ambulatory Visit: Payer: Self-pay | Admitting: Internal Medicine

## 2014-03-29 DIAGNOSIS — M810 Age-related osteoporosis without current pathological fracture: Secondary | ICD-10-CM

## 2014-04-01 ENCOUNTER — Non-Acute Institutional Stay (SKILLED_NURSING_FACILITY): Payer: Medicare Other | Admitting: Nurse Practitioner

## 2014-04-01 DIAGNOSIS — K5900 Constipation, unspecified: Secondary | ICD-10-CM | POA: Diagnosis not present

## 2014-04-01 DIAGNOSIS — M25561 Pain in right knee: Secondary | ICD-10-CM

## 2014-04-01 DIAGNOSIS — N390 Urinary tract infection, site not specified: Secondary | ICD-10-CM

## 2014-04-01 DIAGNOSIS — K219 Gastro-esophageal reflux disease without esophagitis: Secondary | ICD-10-CM

## 2014-04-01 DIAGNOSIS — F329 Major depressive disorder, single episode, unspecified: Secondary | ICD-10-CM

## 2014-04-01 DIAGNOSIS — M79661 Pain in right lower leg: Secondary | ICD-10-CM

## 2014-04-01 DIAGNOSIS — F32A Depression, unspecified: Secondary | ICD-10-CM

## 2014-04-01 NOTE — Assessment & Plan Note (Signed)
Resume home PPI-Omeprazole. Stable.

## 2014-04-01 NOTE — Assessment & Plan Note (Signed)
04/01/14 urine culture E. Coli-Septra DS bid x 7 days.

## 2014-04-01 NOTE — Assessment & Plan Note (Signed)
Hx of depression, stable, takes Celexa 10mg 

## 2014-04-01 NOTE — Assessment & Plan Note (Signed)
Takes Senokot II hs

## 2014-04-01 NOTE — Assessment & Plan Note (Addendum)
03/28/14 Ortho: continue PT/OT, R knee ice pack. F/u 4 wks.  03/01/14 Ortho: the hardware has settled a little but is overall in satisfactory position. WBAT. F/u 4 weeks and X-ray R hip. Address osteoporosis by PCP 03/31/14 dc Lovenox.  04/01/14 Bone Density 04/06/14

## 2014-04-01 NOTE — Progress Notes (Signed)
Patient ID: [name], female   DOB: 06/10/1924, 81 y.o.   MRN: [id number]   Code Status: DNR  Allergies  Allergen Reactions  . Novocain [Procaine]     Passed  out  . Limbrel [Flavocoxid]     Dizziness   . Sertraline Anxiety    Chief Complaint  Patient presents with  . Medical Management of Chronic Issues  . Acute Visit    UTI, osteoporosis    HPI: Patient is a 81 y.o. female seen in the SNF at [facility] today for evaluation of UTI and other chronic medical conditions.    Hospitalized from 02/21/2014-02/24/2014 for R hip fx sustained from mechanical fall at home,  s/p ORIF(IM nail). 2 units RRBC transfused, Hgb 10s upon discharge. Lovenox duration will be determined by Ortho.    Problem List Items Addressed This Visit    UTI (urinary tract infection) - Primary    04/01/14 urine culture E. Coli-Septra DS bid x 7 days.        Pain of right knee and lower leg    03/28/14 Ortho: continue PT/OT, R knee ice pack. F/u 4 wks.  03/01/14 Ortho: the hardware has settled a little but is overall in satisfactory position. WBAT. F/u 4 weeks and X-ray R hip. Address osteoporosis by PCP 03/31/14 dc Lovenox.  04/01/14 Bone Density 04/06/14       GERD (gastroesophageal reflux disease)    Resume home PPI-Omeprazole. Stable.         Depression    Hx of depression, stable, takes Celexa 10mg          Constipation    Takes Senokot II hs           Review of Systems:  Review of Systems  Constitutional: Negative for fever, chills, weight loss, malaise/fatigue and diaphoresis.  HENT: Positive for hearing loss. Negative for congestion, ear discharge, ear pain, nosebleeds, sore throat and tinnitus.   Eyes: Negative for blurred vision, double vision, photophobia, pain, discharge and redness.  Respiratory: Negative for cough, hemoptysis, sputum production, shortness of breath, wheezing and stridor.   Cardiovascular: Positive for leg swelling. Negative for chest pain,  palpitations, orthopnea, claudication and PND.       RLE edma  Gastrointestinal: Negative for heartburn, nausea, vomiting, abdominal pain, diarrhea, constipation, blood in stool and melena.  Genitourinary: Positive for frequency. Negative for dysuria, urgency, hematuria and flank pain.  Musculoskeletal: Positive for joint pain. Negative for myalgias, back pain, falls and neck pain.       Right hip/knee pain  Skin: Negative for itching and rash.       Right knee mild erythema. R hip healed from surgery.   Neurological: Negative for dizziness, tingling, tremors, sensory change, speech change, focal weakness, seizures, loss of consciousness, weakness and headaches.  Endo/Heme/Allergies: Negative for environmental allergies and polydipsia. Does not bruise/bleed easily.  Psychiatric/Behavioral: Positive for depression. Negative for suicidal ideas, hallucinations, memory loss and substance abuse. The patient is nervous/anxious. The patient does not have insomnia.      Past Medical History  Diagnosis Date  . Pain in joint, upper arm   . Disturbance of skin sensation   . Other abnormal clinical finding   . Disturbance of skin sensation   . Unspecified vitamin D deficiency   . Tobacco use disorder   . Chronic airway obstruction, not elsewhere classified   . Hypertension   . GERD (gastroesophageal reflux disease)   . Anemia     iron deficient  .  Chronic back pain   . Chronic cystitis   . Hernia   . Depression   . Anxiety   . Essential and other specified forms of tremor 12/11/2012  . Osteoporosis   . Degenerative arthritis   . History of cerebrovascular disease     Moderate level small vessel disease  . Gait disorder   . Dyslipidemia   . Osteoarthritis     right knee injection per Dr Trudie Reed  . Hyperlipidemia     mild  . Peripheral vascular disease   . Hemorrhoids   . Esophageal reflux   . Dyspnea     Dr Chase Caller  . Rheumatoid arthritis     Dr Trudie Reed  . Cystitis, chronic      Dr Gaynelle Arabian  . Tremor     and gait disorder--Dr Jannifer Franklin  . Foot drop, bilateral 12/21/2013   Past Surgical History  Procedure Laterality Date  . Back surgery Bilateral     X2  . Appendectomy    . Cataract extraction Bilateral   . Bladder resuspension procedure    . Tonsillectomy    . Inguinal hernia repair Bilateral   . Femur im nail Right 02/22/2014    Procedure: INTRAMEDULLARY (IM) NAIL FEMORAL;  Surgeon: Johnny Bridge, MD;  Location: [place];  Service: Orthopedics;  Laterality: Right;   Social History:   reports that she has never smoked. She has never used smokeless tobacco. She reports that she does not drink alcohol or use illicit drugs.  Family History  Problem Relation Age of Onset  . Heart Problems Mother   . Diabetes Father   . Heart attack Father   . Cancer Sister     Brain tumor  . Emphysema Brother     Medications: Patient's Medications  New Prescriptions   No medications on file  Previous Medications   ACETAMINOPHEN (TYLENOL) 650 MG CR TABLET    Take 650 mg by mouth 2 (two) times daily.   CALCIUM CARBONATE-VITAMIN D (CALTRATE 600+D) 600-400 MG-UNIT PER TABLET    Take 1 tablet by mouth daily.   CHOLECALCIFEROL (VITAMIN D) 1000 UNITS TABLET    Take 1,000 Units by mouth daily.   CITALOPRAM (CELEXA) 10 MG TABLET    Take 10 mg by mouth daily.   DICLOFENAC SODIUM (VOLTAREN) 1 % GEL    Apply 4 g topically 4 (four) times daily.   ENOXAPARIN (LOVENOX) 300 MG/3ML SOLN INJECTION    Inject 0.3 mLs (30 mg total) into the skin daily.   HYDROCODONE-ACETAMINOPHEN (NORCO) 5-325 MG PER TABLET    Take 1-2 tablets by mouth every 6 (six) hours as needed for moderate pain. MAXIMUM TOTAL ACETAMINOPHEN DOSE IS 4000 MG PER DAY   MISC NATURAL PRODUCTS (OSTEO BI-FLEX JOINT SHIELD) TABS    Take 1 tablet by mouth daily.   MULTIPLE VITAMIN (MULTIVITAMIN) TABLET    Take 1 tablet by mouth daily.   OMEPRAZOLE (PRILOSEC) 20 MG CAPSULE    Take 20 mg by mouth daily.   SENNOSIDES-DOCUSATE SODIUM  (SENOKOT-S) 8.6-50 MG TABLET    Take 2 tablets by mouth daily.  Modified Medications   No medications on file  Discontinued Medications   No medications on file     Physical Exam: Physical Exam  Constitutional: She is oriented to person, place, and time. She appears well-developed and well-nourished.  HENT:  Head: Normocephalic and atraumatic.  Right Ear: External ear normal.  Left Ear: External ear normal.  Nose: Nose normal.  Mouth/Throat: Oropharynx is clear  and moist. No oropharyngeal exudate.  Eyes: Conjunctivae and EOM are normal. Pupils are equal, round, and reactive to light. Right eye exhibits no discharge. Left eye exhibits no discharge. No scleral icterus.  Neck: Normal range of motion. Neck supple. No JVD present. No tracheal deviation present. No thyromegaly present.  Cardiovascular: Normal rate and regular rhythm.  Exam reveals no friction rub.   Murmur heard.  Systolic murmur is present with a grade of 2/6  Pulmonary/Chest: Effort normal and breath sounds normal. No stridor. No respiratory distress. She has no wheezes. She has no rales. She exhibits no tenderness.  Abdominal: Soft. She exhibits no distension and no mass. There is no tenderness. There is no rebound and no guarding.  Musculoskeletal: She exhibits edema and tenderness.  RLE trace. Pain in the R hip with ROM and weight bearing/  Lymphadenopathy:    She has no cervical adenopathy.  Neurological: She is alert and oriented to person, place, and time. She has normal reflexes. No cranial nerve deficit. She exhibits normal muscle tone. Coordination normal.  Skin: Skin is warm and dry. No rash noted. No erythema. No pallor.  Surgical wounds healed, noticeable RLE edema. BLE scaly and mild erythema. C/o soreness in the right ankle-able to bear weight and ambulates w/o excessive pain. The right forefoot ecchymosis-better per the patient-able to wiggle toes/pulling up/push down w/o pain.    Psychiatric: She has a  normal mood and affect. Her behavior is normal. Judgment and thought content normal.   (type .physexam) Filed Vitals:   04/01/14 1335  BP: 142/76  Pulse: 90  Temp: 98.6 F (37 C)  TempSrc: Tympanic  Resp: 20      Labs reviewed: Basic Metabolic Panel:  Recent Labs  12/21/13 1440  02/22/14 0525 02/23/14 0433 02/24/14 0520 02/28/14  NA  --   < > 144 144 140 140  K  --   < > 4.1 4.0 3.5* 4.4  CL  --   < > 105 105 102  --   CO2  --   < > 29 30 28   --   GLUCOSE  --   < > 182* 128* 107*  --   BUN  --   < > 23 22 16 18   CREATININE  --   < > 0.85 0.79 0.62 0.7  CALCIUM  --   < > 9.3 8.8 9.1  --   TSH 1.040  --   --   --   --  2.13  < > = values in this interval not displayed. Liver Function Tests:  Recent Labs  02/28/14  AST 38*  ALT 17  ALKPHOS 44   No results for input(s): LIPASE, AMYLASE in the last 8760 hours. No results for input(s): AMMONIA in the last 8760 hours. CBC:  Recent Labs  02/21/14 1734 02/22/14 0525 02/23/14 0433 02/24/14 0520 02/28/14 03/03/14  WBC 7.6 10.3 9.2 6.1 9.6 9.5  NEUTROABS 5.6  --   --   --   --   --   HGB 14.0 11.1* 8.6* 10.6* 11.8* 12.1  HCT 42.9 33.9* 26.2* 31.2* 36 36  MCV 102.9* 104.0* 103.6* 97.5  --   --   PLT 209 195 179 134* 368 436*   Lipid Panel: No results for input(s): CHOL, HDL, LDLCALC, TRIG, CHOLHDL, LDLDIRECT in the last 8760 hours.  Past Procedures:  02/21/14 X-ray R shoulder:  IMPRESSION: 1. Scapular fracture, which is across the scapular spine. This may also involve the base of  the coracoid process. There is sclerosis along the fracture margins of the fracture scapular spine suggesting that this is chronic or subacute. 2. No other fractures. No dislocation. Chronic full-thickness rotator cuff tear.   02/21/14 R hip  IMPRESSION: Comminuted intertrochanteric fracture with extension into the proximal femoral shaft.   02/21/14 R femur:  IMPRESSION: Comminuted, displaced fracture of the proximal right  femur as described. No dislocation.  02/21/14 CXR   IMPRESSION: 1. No active cardiopulmonary abnormalities. 2. Age indeterminate scapular fracture. Recommend dedicated images of the right shoulder and scapula.   02/21/14 CT head:  IMPRESSION: 1. No acute intracranial abnormalities. 2. Advanced, chronic microvascular ischemic disease.    Assessment/Plan UTI (urinary tract infection) 04/01/14 urine culture E. Coli-Septra DS bid x 7 days.     Pain of right knee and lower leg 03/28/14 Ortho: continue PT/OT, R knee ice pack. F/u 4 wks.  03/01/14 Ortho: the hardware has settled a little but is overall in satisfactory position. WBAT. F/u 4 weeks and X-ray R hip. Address osteoporosis by PCP 03/31/14 dc Lovenox.  04/01/14 Bone Density 04/06/14    GERD (gastroesophageal reflux disease) Resume home PPI-Omeprazole. Stable.      Depression Hx of depression, stable, takes Celexa 10mg       Constipation Takes Senokot II hs       Family/ Staff Communication: observe the patient.   Goals of Care: SNF  Labs/tests ordered: urine culture available 04/01/14

## 2014-04-06 ENCOUNTER — Ambulatory Visit
Admission: RE | Admit: 2014-04-06 | Discharge: 2014-04-06 | Disposition: A | Discharge status: Home / Self Care | Payer: Medicare Other | Source: Ambulatory Visit | Attending: Internal Medicine | Admitting: Internal Medicine

## 2014-04-06 DIAGNOSIS — Z780 Asymptomatic menopausal state: Secondary | ICD-10-CM | POA: Diagnosis not present

## 2014-04-06 DIAGNOSIS — M85852 Other specified disorders of bone density and structure, left thigh: Secondary | ICD-10-CM | POA: Diagnosis not present

## 2014-04-06 DIAGNOSIS — M810 Age-related osteoporosis without current pathological fracture: Secondary | ICD-10-CM

## 2014-04-12 DIAGNOSIS — R262 Difficulty in walking, not elsewhere classified: Secondary | ICD-10-CM | POA: Diagnosis not present

## 2014-04-12 DIAGNOSIS — J984 Other disorders of lung: Secondary | ICD-10-CM | POA: Diagnosis not present

## 2014-04-12 DIAGNOSIS — K219 Gastro-esophageal reflux disease without esophagitis: Secondary | ICD-10-CM | POA: Diagnosis not present

## 2014-04-12 DIAGNOSIS — R0600 Dyspnea, unspecified: Secondary | ICD-10-CM | POA: Diagnosis not present

## 2014-04-12 DIAGNOSIS — Z9181 History of falling: Secondary | ICD-10-CM | POA: Diagnosis not present

## 2014-04-12 DIAGNOSIS — R279 Unspecified lack of coordination: Secondary | ICD-10-CM | POA: Diagnosis not present

## 2014-04-12 DIAGNOSIS — I10 Essential (primary) hypertension: Secondary | ICD-10-CM | POA: Diagnosis not present

## 2014-04-12 DIAGNOSIS — N814 Uterovaginal prolapse, unspecified: Secondary | ICD-10-CM | POA: Diagnosis not present

## 2014-04-12 DIAGNOSIS — M6281 Muscle weakness (generalized): Secondary | ICD-10-CM | POA: Diagnosis not present

## 2014-04-12 DIAGNOSIS — R270 Ataxia, unspecified: Secondary | ICD-10-CM | POA: Diagnosis not present

## 2014-04-12 DIAGNOSIS — M4800 Spinal stenosis, site unspecified: Secondary | ICD-10-CM | POA: Diagnosis not present

## 2014-04-12 DIAGNOSIS — Z4889 Encounter for other specified surgical aftercare: Secondary | ICD-10-CM | POA: Diagnosis not present

## 2014-04-12 DIAGNOSIS — R2990 Unspecified symptoms and signs involving the nervous system: Secondary | ICD-10-CM | POA: Diagnosis not present

## 2014-04-12 DIAGNOSIS — M5134 Other intervertebral disc degeneration, thoracic region: Secondary | ICD-10-CM | POA: Diagnosis not present

## 2014-04-13 DIAGNOSIS — M5134 Other intervertebral disc degeneration, thoracic region: Secondary | ICD-10-CM | POA: Diagnosis not present

## 2014-04-13 DIAGNOSIS — M4800 Spinal stenosis, site unspecified: Secondary | ICD-10-CM | POA: Diagnosis not present

## 2014-04-13 DIAGNOSIS — M6281 Muscle weakness (generalized): Secondary | ICD-10-CM | POA: Diagnosis not present

## 2014-04-13 DIAGNOSIS — K219 Gastro-esophageal reflux disease without esophagitis: Secondary | ICD-10-CM | POA: Diagnosis not present

## 2014-04-13 DIAGNOSIS — I10 Essential (primary) hypertension: Secondary | ICD-10-CM | POA: Diagnosis not present

## 2014-04-13 DIAGNOSIS — J984 Other disorders of lung: Secondary | ICD-10-CM | POA: Diagnosis not present

## 2014-04-14 DIAGNOSIS — M5134 Other intervertebral disc degeneration, thoracic region: Secondary | ICD-10-CM | POA: Diagnosis not present

## 2014-04-14 DIAGNOSIS — M4800 Spinal stenosis, site unspecified: Secondary | ICD-10-CM | POA: Diagnosis not present

## 2014-04-14 DIAGNOSIS — J984 Other disorders of lung: Secondary | ICD-10-CM | POA: Diagnosis not present

## 2014-04-14 DIAGNOSIS — M6281 Muscle weakness (generalized): Secondary | ICD-10-CM | POA: Diagnosis not present

## 2014-04-14 DIAGNOSIS — K219 Gastro-esophageal reflux disease without esophagitis: Secondary | ICD-10-CM | POA: Diagnosis not present

## 2014-04-14 DIAGNOSIS — I10 Essential (primary) hypertension: Secondary | ICD-10-CM | POA: Diagnosis not present

## 2014-04-15 ENCOUNTER — Non-Acute Institutional Stay (SKILLED_NURSING_FACILITY): Payer: Medicare Other | Admitting: Nurse Practitioner

## 2014-04-15 DIAGNOSIS — M899 Disorder of bone, unspecified: Secondary | ICD-10-CM

## 2014-04-15 DIAGNOSIS — J984 Other disorders of lung: Secondary | ICD-10-CM | POA: Diagnosis not present

## 2014-04-15 DIAGNOSIS — M8580 Other specified disorders of bone density and structure, unspecified site: Secondary | ICD-10-CM | POA: Insufficient documentation

## 2014-04-15 DIAGNOSIS — K219 Gastro-esophageal reflux disease without esophagitis: Secondary | ICD-10-CM | POA: Diagnosis not present

## 2014-04-15 DIAGNOSIS — R4189 Other symptoms and signs involving cognitive functions and awareness: Secondary | ICD-10-CM

## 2014-04-15 DIAGNOSIS — K5900 Constipation, unspecified: Secondary | ICD-10-CM | POA: Diagnosis not present

## 2014-04-15 DIAGNOSIS — I10 Essential (primary) hypertension: Secondary | ICD-10-CM | POA: Diagnosis not present

## 2014-04-15 DIAGNOSIS — F329 Major depressive disorder, single episode, unspecified: Secondary | ICD-10-CM | POA: Diagnosis not present

## 2014-04-15 DIAGNOSIS — M5134 Other intervertebral disc degeneration, thoracic region: Secondary | ICD-10-CM | POA: Diagnosis not present

## 2014-04-15 DIAGNOSIS — M4800 Spinal stenosis, site unspecified: Secondary | ICD-10-CM | POA: Diagnosis not present

## 2014-04-15 DIAGNOSIS — F32A Depression, unspecified: Secondary | ICD-10-CM

## 2014-04-15 DIAGNOSIS — M6281 Muscle weakness (generalized): Secondary | ICD-10-CM | POA: Diagnosis not present

## 2014-04-15 NOTE — Assessment & Plan Note (Signed)
Takes Senokot II hs

## 2014-04-15 NOTE — Assessment & Plan Note (Signed)
04/15/14 SLUMS 19/30. Will continue to observe for her cognition at age of 80 instead of memory preserving medications.

## 2014-04-15 NOTE — Assessment & Plan Note (Signed)
Resume home PPI-Omeprazole. Stable.

## 2014-04-15 NOTE — Assessment & Plan Note (Signed)
Hx of depression, stable, takes Celexa 10mg 

## 2014-04-15 NOTE — Progress Notes (Signed)
Patient ID: [name], female   DOB: May 06, 1924, 81 y.o.   MRN: [id number]   Code Status: DNR  Allergies  Allergen Reactions  . Novocain [Procaine]     Passed  out  . Limbrel [Flavocoxid]     Dizziness   . Sertraline Anxiety    Chief Complaint  Patient presents with  . Medical Management of Chronic Issues  . Acute Visit    osteopenia, mild cognitive deficit.     HPI: Patient is a 81 y.o. female seen in the SNF at [facility] today for evaluation of osteopenia, mild cognitive deficit, and other chronic medical conditions.    Hospitalized from 02/21/2014-02/24/2014 for R hip fx sustained from mechanical fall at home,  s/p ORIF(IM nail). 2 units RRBC transfused, Hgb 10s upon discharge. Lovenox duration will be determined by Ortho.    Problem List Items Addressed This Visit    Osteopenia, senile - Primary    04/13/14 DXA-neck T -2.0--osteopenia. Continue Ca and Vit D       GERD (gastroesophageal reflux disease)    Resume home PPI-Omeprazole. Stable.         Depression    Hx of depression, stable, takes Celexa 10mg          Constipation    Takes Senokot II hs       Cognitive deficits    04/15/14 SLUMS 19/30. Will continue to observe for her cognition at age of 52 instead of memory preserving medications.           Review of Systems:  Review of Systems  Constitutional: Negative for fever, chills, weight loss, malaise/fatigue and diaphoresis.  HENT: Positive for hearing loss. Negative for congestion, ear discharge, ear pain, nosebleeds, sore throat and tinnitus.   Eyes: Negative for blurred vision, double vision, photophobia, discharge and redness.  Respiratory: Negative for cough, hemoptysis, sputum production, shortness of breath, wheezing and stridor.   Cardiovascular: Positive for leg swelling. Negative for chest pain, palpitations, orthopnea, claudication and PND.       Right leg  Gastrointestinal: Negative for heartburn, nausea, vomiting, abdominal  pain, diarrhea, constipation, blood in stool and melena.  Genitourinary: Positive for frequency. Negative for dysuria, urgency, hematuria and flank pain.  Musculoskeletal: Positive for joint pain. Negative for myalgias, falls and neck pain.       Right hip pain-ambulates with walker.   Skin: Negative for itching and rash.  Neurological: Negative for dizziness, tingling, tremors, sensory change, speech change, focal weakness, seizures, loss of consciousness, weakness and headaches.  Endo/Heme/Allergies: Negative for environmental allergies and polydipsia. Does not bruise/bleed easily.  Psychiatric/Behavioral: Positive for depression and memory loss. Negative for suicidal ideas, hallucinations and substance abuse. The patient is nervous/anxious. The patient does not have insomnia.      Past Medical History  Diagnosis Date  . Pain in joint, upper arm   . Disturbance of skin sensation   . Other abnormal clinical finding   . Disturbance of skin sensation   . Unspecified vitamin D deficiency   . Tobacco use disorder   . Chronic airway obstruction, not elsewhere classified   . Hypertension   . GERD (gastroesophageal reflux disease)   . Anemia     iron deficient  . Chronic back pain   . Chronic cystitis   . Hernia   . Depression   . Anxiety   . Essential and other specified forms of tremor 12/11/2012  . Osteoporosis   . Degenerative arthritis   . History of  cerebrovascular disease     Moderate level small vessel disease  . Gait disorder   . Dyslipidemia   . Osteoarthritis     right knee injection per Dr Trudie Reed  . Hyperlipidemia     mild  . Peripheral vascular disease   . Hemorrhoids   . Esophageal reflux   . Dyspnea     Dr Chase Caller  . Rheumatoid arthritis     Dr Trudie Reed  . Cystitis, chronic     Dr Gaynelle Arabian  . Tremor     and gait disorder--Dr Jannifer Franklin  . Foot drop, bilateral 12/21/2013   Past Surgical History  Procedure Laterality Date  . Back surgery Bilateral     X2  .  Appendectomy    . Cataract extraction Bilateral   . Bladder resuspension procedure    . Tonsillectomy    . Inguinal hernia repair Bilateral   . Femur im nail Right 02/22/2014    Procedure: INTRAMEDULLARY (IM) NAIL FEMORAL;  Surgeon: Johnny Bridge, MD;  Location: [place];  Service: Orthopedics;  Laterality: Right;   Social History:   reports that she has never smoked. She has never used smokeless tobacco. She reports that she does not drink alcohol or use illicit drugs.  Family History  Problem Relation Age of Onset  . Heart Problems Mother   . Diabetes Father   . Heart attack Father   . Cancer Sister     Brain tumor  . Emphysema Brother     Medications: Patient's Medications  New Prescriptions   No medications on file  Previous Medications   ACETAMINOPHEN (TYLENOL) 650 MG CR TABLET    Take 650 mg by mouth 2 (two) times daily.   CALCIUM CARBONATE-VITAMIN D (CALTRATE 600+D) 600-400 MG-UNIT PER TABLET    Take 1 tablet by mouth daily.   CHOLECALCIFEROL (VITAMIN D) 1000 UNITS TABLET    Take 1,000 Units by mouth daily.   CITALOPRAM (CELEXA) 10 MG TABLET    Take 10 mg by mouth daily.   DICLOFENAC SODIUM (VOLTAREN) 1 % GEL    Apply 4 g topically 4 (four) times daily.   ENOXAPARIN (LOVENOX) 300 MG/3ML SOLN INJECTION    Inject 0.3 mLs (30 mg total) into the skin daily.   HYDROCODONE-ACETAMINOPHEN (NORCO) 5-325 MG PER TABLET    Take 1-2 tablets by mouth every 6 (six) hours as needed for moderate pain. MAXIMUM TOTAL ACETAMINOPHEN DOSE IS 4000 MG PER DAY   MISC NATURAL PRODUCTS (OSTEO BI-FLEX JOINT SHIELD) TABS    Take 1 tablet by mouth daily.   MULTIPLE VITAMIN (MULTIVITAMIN) TABLET    Take 1 tablet by mouth daily.   OMEPRAZOLE (PRILOSEC) 20 MG CAPSULE    Take 20 mg by mouth daily.   SENNOSIDES-DOCUSATE SODIUM (SENOKOT-S) 8.6-50 MG TABLET    Take 2 tablets by mouth daily.  Modified Medications   No medications on file  Discontinued Medications   No medications on file     Physical  Exam: Physical Exam  Constitutional: She is oriented to person, place, and time. She appears well-developed and well-nourished.  HENT:  Head: Normocephalic and atraumatic.  Right Ear: External ear normal.  Left Ear: External ear normal.  Nose: Nose normal.  Mouth/Throat: Oropharynx is clear and moist. No oropharyngeal exudate.  Eyes: Conjunctivae and EOM are normal. Pupils are equal, round, and reactive to light. Right eye exhibits no discharge. Left eye exhibits no discharge. No scleral icterus.  Neck: Normal range of motion. Neck supple. No JVD present.  No tracheal deviation present. No thyromegaly present.  Cardiovascular: Normal rate and regular rhythm.  Exam reveals no friction rub.   Murmur heard.  Systolic murmur is present with a grade of 2/6  Pulmonary/Chest: Effort normal and breath sounds normal. No stridor. No respiratory distress. She has no wheezes. She has no rales. She exhibits no tenderness.  Abdominal: Soft. She exhibits no distension and no mass. There is no tenderness. There is no rebound and no guarding.  Musculoskeletal: She exhibits edema and tenderness.  RLE trace. Pain in the R hip with ROM and weight bearing/  Lymphadenopathy:    She has no cervical adenopathy.  Neurological: She is alert and oriented to person, place, and time. She has normal reflexes. No cranial nerve deficit. She exhibits normal muscle tone. Coordination normal.  Skin: Skin is warm and dry. No rash noted. No erythema. No pallor.  Surgical wounds healed, noticeable RLE edema. BLE scaly and mild erythema. C/o soreness in the right ankle-able to bear weight and ambulates w/o excessive pain. The right forefoot ecchymosis-better per the patient-able to wiggle toes/pulling up/push down w/o pain.    Psychiatric: She has a normal mood and affect. Her behavior is normal. Judgment and thought content normal.   (type .physexam) Filed Vitals:   04/15/14 1441  BP: 121/64  Pulse: 64  Temp: 99.1 F (37.3  C)  TempSrc: Tympanic  Resp: 20      Labs reviewed: Basic Metabolic Panel:  Recent Labs  12/21/13 1440  02/22/14 0525 02/23/14 0433 02/24/14 0520 02/28/14  NA  --   < > 144 144 140 140  K  --   < > 4.1 4.0 3.5* 4.4  CL  --   < > 105 105 102  --   CO2  --   < > 29 30 28   --   GLUCOSE  --   < > 182* 128* 107*  --   BUN  --   < > 23 22 16 18   CREATININE  --   < > 0.85 0.79 0.62 0.7  CALCIUM  --   < > 9.3 8.8 9.1  --   TSH 1.040  --   --   --   --  2.13  < > = values in this interval not displayed. Liver Function Tests:  Recent Labs  02/28/14  AST 38*  ALT 17  ALKPHOS 44   No results for input(s): LIPASE, AMYLASE in the last 8760 hours. No results for input(s): AMMONIA in the last 8760 hours. CBC:  Recent Labs  02/21/14 1734 02/22/14 0525 02/23/14 0433 02/24/14 0520 02/28/14 03/03/14  WBC 7.6 10.3 9.2 6.1 9.6 9.5  NEUTROABS 5.6  --   --   --   --   --   HGB 14.0 11.1* 8.6* 10.6* 11.8* 12.1  HCT 42.9 33.9* 26.2* 31.2* 36 36  MCV 102.9* 104.0* 103.6* 97.5  --   --   PLT 209 195 179 134* 368 436*   Lipid Panel: No results for input(s): CHOL, HDL, LDLCALC, TRIG, CHOLHDL, LDLDIRECT in the last 8760 hours.  Past Procedures:  02/21/14 X-ray R shoulder:  IMPRESSION: 1. Scapular fracture, which is across the scapular spine. This may also involve the base of the coracoid process. There is sclerosis along the fracture margins of the fracture scapular spine suggesting that this is chronic or subacute. 2. No other fractures. No dislocation. Chronic full-thickness rotator cuff tear.   02/21/14 R hip  IMPRESSION: Comminuted intertrochanteric fracture with  extension into the proximal femoral shaft.   02/21/14 R femur:  IMPRESSION: Comminuted, displaced fracture of the proximal right femur as described. No dislocation.  02/21/14 CXR   IMPRESSION: 1. No active cardiopulmonary abnormalities. 2. Age indeterminate scapular fracture. Recommend dedicated  images of the right shoulder and scapula.   02/21/14 CT head:  IMPRESSION: 1. No acute intracranial abnormalities. 2. Advanced, chronic microvascular ischemic disease.    Assessment/Plan Osteopenia, senile 04/13/14 DXA-neck T -2.0--osteopenia. Continue Ca and Vit D    Cognitive deficits 04/15/14 SLUMS 19/30. Will continue to observe for her cognition at age of 56 instead of memory preserving medications.     GERD (gastroesophageal reflux disease) Resume home PPI-Omeprazole. Stable.      Constipation Takes Senokot II hs    Depression Hx of depression, stable, takes Celexa 10mg         Family/ Staff Communication: observe the patient.   Goals of Care: [place]  Labs/tests ordered: none

## 2014-04-15 NOTE — Assessment & Plan Note (Signed)
04/13/14 DXA-neck T -2.0--osteopenia. Continue Ca and Vit D

## 2014-04-25 DIAGNOSIS — M25562 Pain in left knee: Secondary | ICD-10-CM | POA: Diagnosis not present

## 2014-04-25 DIAGNOSIS — S7221XD Displaced subtrochanteric fracture of right femur, subsequent encounter for closed fracture with routine healing: Secondary | ICD-10-CM | POA: Diagnosis not present

## 2014-05-13 DIAGNOSIS — L03031 Cellulitis of right toe: Secondary | ICD-10-CM | POA: Diagnosis not present

## 2014-05-13 DIAGNOSIS — L600 Ingrowing nail: Secondary | ICD-10-CM | POA: Diagnosis not present

## 2014-05-13 DIAGNOSIS — L602 Onychogryphosis: Secondary | ICD-10-CM | POA: Diagnosis not present

## 2014-05-13 DIAGNOSIS — L03032 Cellulitis of left toe: Secondary | ICD-10-CM | POA: Diagnosis not present

## 2014-05-19 DIAGNOSIS — Z8781 Personal history of (healed) traumatic fracture: Secondary | ICD-10-CM | POA: Diagnosis not present

## 2014-05-19 DIAGNOSIS — I10 Essential (primary) hypertension: Secondary | ICD-10-CM | POA: Diagnosis not present

## 2014-05-19 DIAGNOSIS — F418 Other specified anxiety disorders: Secondary | ICD-10-CM | POA: Diagnosis not present

## 2014-05-19 DIAGNOSIS — E785 Hyperlipidemia, unspecified: Secondary | ICD-10-CM | POA: Diagnosis not present

## 2014-05-23 DIAGNOSIS — S7221XD Displaced subtrochanteric fracture of right femur, subsequent encounter for closed fracture with routine healing: Secondary | ICD-10-CM | POA: Diagnosis not present

## 2014-07-04 ENCOUNTER — Encounter (HOSPITAL_BASED_OUTPATIENT_CLINIC_OR_DEPARTMENT_OTHER): Payer: Medicare Other

## 2014-10-04 ENCOUNTER — Ambulatory Visit (INDEPENDENT_AMBULATORY_CARE_PROVIDER_SITE_OTHER): Payer: Medicare Other | Admitting: Adult Health

## 2014-10-04 ENCOUNTER — Encounter: Payer: Self-pay | Admitting: Adult Health

## 2014-10-04 VITALS — BP 132/77 | HR 90 | Ht 61.0 in | Wt 122.0 lb

## 2014-10-04 DIAGNOSIS — G609 Hereditary and idiopathic neuropathy, unspecified: Secondary | ICD-10-CM | POA: Diagnosis not present

## 2014-10-04 DIAGNOSIS — G250 Essential tremor: Secondary | ICD-10-CM | POA: Diagnosis not present

## 2014-10-04 DIAGNOSIS — R269 Unspecified abnormalities of gait and mobility: Secondary | ICD-10-CM | POA: Diagnosis not present

## 2014-10-04 NOTE — Patient Instructions (Signed)
Use walker at all times. If your symptoms worsen or you develop new symptoms please let us know.

## 2014-10-04 NOTE — Progress Notes (Signed)
I have read the note, and I agree with the clinical assessment and plan.  Audre Cenci KEITH   

## 2014-10-04 NOTE — Progress Notes (Signed)
PATIENT: [name] DOB: 1924-10-30  REASON FOR VISIT: follow up- essential tremor, gait disorder HISTORY FROM: patient  HISTORY OF PRESENT ILLNESS:  [name] is an 81 year old female with a history of essential tremor and gait disorder. She returns today for follow-up. The patient does not require any medications for her tremor. Her tremor has been located in both hands primarily. His last visit the patient did fall around Christmas and broke her right hip. [facility] [place] in the [facility]-living portion. She is been completing physical therapy daily. She uses a walker now to ambulate. Denies any additional falls. Denies any discomfort in the feet. She is able to complete all ADLs independently. Overall she feels that she has been doing well since her fall. She returns today for an evaluation.  HISTORY 12/21/13 ([name]): [name] is an 81 year old right-handed white female with a history of an essential tremor and a gait disorder. The patient has had issues with her balance for number of years, but she indicates that this problem has gradually worsened over time. The patient has fallen on 2 occasions, one in December of 2014, and then again in June of 2015. CT scan evaluations of the brain showed no acute changes. The patient has not report any numbness of the hands or feet. The patient uses a cane for ambulation, but she has fallen even while using a cane. The patient indicates that she will have a tendency to fall backwards. The patient struck her head on the last fall, but she did not lose consciousness. The patient denies any blackouts with the falls. She has had some dizziness since the last fall in June of 2015. She denies any neck pain or low back pain or pain down arms or legs. She currently is in physical therapy for her balance. She is sent to this office for an evaluation.   REVIEW OF SYSTEMS: Out of a complete 14 system review of symptoms, the patient  complains only of the following symptoms, and all other reviewed systems are negative.  See history of present illness  ALLERGIES: Allergies  Allergen Reactions  . Novocain [Procaine]     Passed  out  . Limbrel [Flavocoxid]     Dizziness   . Sertraline Anxiety    HOME MEDICATIONS: Outpatient Prescriptions Prior to Visit  Medication Sig Dispense Refill  . acetaminophen (TYLENOL) 650 MG CR tablet Take 650 mg by mouth 2 (two) times daily.    . Calcium Carbonate-Vitamin D (CALTRATE 600+D) 600-400 MG-UNIT per tablet Take 1 tablet by mouth daily.    . cholecalciferol (VITAMIN D) 1000 UNITS tablet Take 1,000 Units by mouth daily.    . citalopram (CELEXA) 10 MG tablet Take 10 mg by mouth daily.    . diclofenac sodium (VOLTAREN) 1 % GEL Apply 4 g topically 4 (four) times daily.    Marland Kitchen enoxaparin (LOVENOX) 300 MG/3ML SOLN injection Inject 0.3 mLs (30 mg total) into the skin daily. 21 vial 0  . HYDROcodone-acetaminophen (NORCO) 5-325 MG per tablet Take 1-2 tablets by mouth every 6 (six) hours as needed for moderate pain. MAXIMUM TOTAL ACETAMINOPHEN DOSE IS 4000 MG PER DAY 50 tablet 0  . Misc Natural Products (OSTEO BI-FLEX JOINT SHIELD) TABS Take 1 tablet by mouth daily.    . Multiple Vitamin (MULTIVITAMIN) tablet Take 1 tablet by mouth daily.    Marland Kitchen omeprazole (PRILOSEC) 20 MG capsule Take 20 mg by mouth daily.    . sennosides-docusate sodium (  SENOKOT-S) 8.6-50 MG tablet Take 2 tablets by mouth daily. 30 tablet 1   No facility-administered medications prior to visit.    PAST MEDICAL HISTORY: Past Medical History  Diagnosis Date  . Pain in joint, upper arm   . Disturbance of skin sensation   . Other abnormal clinical finding   . Disturbance of skin sensation   . Unspecified vitamin D deficiency   . Tobacco use disorder   . Chronic airway obstruction, not elsewhere classified   . Hypertension   . GERD (gastroesophageal reflux disease)   . Anemia     iron deficient  . Chronic back pain    . Chronic cystitis   . Hernia   . Depression   . Anxiety   . Essential and other specified forms of tremor 12/11/2012  . Osteoporosis   . Degenerative arthritis   . History of cerebrovascular disease     Moderate level small vessel disease  . Gait disorder   . Dyslipidemia   . Osteoarthritis     right knee injection per Dr Trudie Reed  . Hyperlipidemia     mild  . Peripheral vascular disease   . Hemorrhoids   . Esophageal reflux   . Dyspnea     Dr Chase Caller  . Rheumatoid arthritis     Dr Trudie Reed  . Cystitis, chronic     Dr Gaynelle Arabian  . Tremor     and gait disorder--Dr Jannifer Franklin  . Foot drop, bilateral 12/21/2013    PAST SURGICAL HISTORY: Past Surgical History  Procedure Laterality Date  . Back surgery Bilateral     X2  . Appendectomy    . Cataract extraction Bilateral   . Bladder resuspension procedure    . Tonsillectomy    . Inguinal hernia repair Bilateral   . Femur im nail Right 02/22/2014    Procedure: INTRAMEDULLARY (IM) NAIL FEMORAL;  Surgeon: Johnny Bridge, MD;  Location: [facility];  Service: Orthopedics;  Laterality: Right;    FAMILY HISTORY: Family History  Problem Relation Age of Onset  . Heart Problems Mother   . Diabetes Father   . Heart attack Father   . Cancer Sister     Brain tumor  . Emphysema Brother     SOCIAL HISTORY: History   Social History  . Marital Status: Widowed    Spouse Name: N/A  . Number of Children: 3  . Years of Education: college   Occupational History  . SECRETARY   . Retired     Social History Main Topics  . Smoking status: Never Smoker   . Smokeless tobacco: Never Used  . Alcohol Use: No  . Drug Use: No  . Sexual Activity: Not on file   Other Topics Concern  . Not on file   Social History Narrative   Patient lives at [facility].    Patient is widowed.    Patient has 3 children.    Patient is right handed.    Patient has 2 years of Business school.       PHYSICAL EXAM  Filed Vitals:   10/04/14  1413  BP: 132/77  Pulse: 90  Height: 5\' 1"  (1.549 m)  Weight: 122 lb (55.339 kg)   Body mass index is 23.06 kg/(m^2).  Generalized: Well developed, in no acute distress   Neurological examination  Mentation: Alert oriented to time, place, history taking. Follows all commands speech and language fluent Cranial nerve II-XII: Pupils were equal round reactive to light. Extraocular movements were  full, visual field were full on confrontational test. Facial sensation and strength were normal. Uvula tongue midline. Head turning and shoulder shrug  were normal and symmetric. Motor: The motor testing reveals 5 over 5 strength of all 4 extremities. Good symmetric motor tone is noted throughout.  Sensory: Sensory testing is intact to soft touch on all 4 extremities. No evidence of extinction is noted.  Coordination: Cerebellar testing reveals good finger-nose-finger and heel-to-shin bilaterally.  Gait and station: Gait is normal, uses a walker when ambulating Reflexes: Deep tendon reflexes are symmetric and normal bilaterally.   DIAGNOSTIC DATA (LABS, IMAGING, TESTING) - I reviewed patient records, labs, notes, testing and imaging myself where available.     ASSESSMENT AND PLAN 81 y.o. year old female  has a past medical history of Pain in joint, upper arm; Disturbance of skin sensation; Other abnormal clinical finding; Disturbance of skin sensation; Unspecified vitamin D deficiency; Tobacco use disorder; Chronic airway obstruction, not elsewhere classified; Hypertension; GERD (gastroesophageal reflux disease); Anemia; Chronic back pain; Chronic cystitis; Hernia; Depression; Anxiety; Essential and other specified forms of tremor (12/11/2012); Osteoporosis; Degenerative arthritis; History of cerebrovascular disease; Gait disorder; Dyslipidemia; Osteoarthritis; Hyperlipidemia; Peripheral vascular disease; Hemorrhoids; Esophageal reflux; Dyspnea; Rheumatoid arthritis; Cystitis, chronic; Tremor; and Foot  drop, bilateral (12/21/2013). here with:  1. Essential tremor 2. Abnormality of gait 3. Peripheral neuropathy  The patient's tremor has remained stable. She does not require any medications. The patient did suffer a fall around Christmas and broke her hip. I have encouraged the patient to continue with physical therapy and to use her walker at all times. She verbalized understanding. Patient advised that if her symptoms worsen or she develops new symptoms she should let us know. Otherwise she will follow-up in 6 months or sooner if needed.  Ward Givens, MSN, NP-C 10/04/2014, 2:24 PM [facility] [address] [place], [place] [address] [phone]  Note: This document was prepared with digital dictation and possible smart phrase technology. Any transcriptional errors that result from this process are unintentional.

## 2014-12-21 DIAGNOSIS — H52203 Unspecified astigmatism, bilateral: Secondary | ICD-10-CM | POA: Diagnosis not present

## 2014-12-21 DIAGNOSIS — Z961 Presence of intraocular lens: Secondary | ICD-10-CM | POA: Diagnosis not present

## 2015-04-06 ENCOUNTER — Ambulatory Visit (INDEPENDENT_AMBULATORY_CARE_PROVIDER_SITE_OTHER): Payer: Medicare Other | Admitting: Adult Health

## 2015-04-06 ENCOUNTER — Encounter: Payer: Self-pay | Admitting: Adult Health

## 2015-04-06 VITALS — BP 141/75 | HR 93 | Ht 61.0 in | Wt 127.0 lb

## 2015-04-06 DIAGNOSIS — G250 Essential tremor: Secondary | ICD-10-CM

## 2015-04-06 DIAGNOSIS — R269 Unspecified abnormalities of gait and mobility: Secondary | ICD-10-CM | POA: Diagnosis not present

## 2015-04-06 NOTE — Progress Notes (Signed)
I have read the note, and I agree with the clinical assessment and plan.  Karleen Seebeck KEITH   

## 2015-04-06 NOTE — Patient Instructions (Signed)
If your tremor worsens let me know Continue to you a walker when walking If your symptoms worsen or you develop new symptoms please let us know.

## 2015-04-06 NOTE — Progress Notes (Signed)
PATIENT: [name] DOB: 1924/06/09  REASON FOR VISIT: follow up-  Tremor, gait disorder HISTORY FROM: patient  HISTORY OF PRESENT ILLNESS:  [name] is an 82 year old female with a history of essential tremor and gait disorder.  She returns today for follow-up. She is currently not on any medication or her tremor. She reports that her tremor has been stable. She is able to feed herself address herself. She uses a walker when ambulating. She is not had any additional falls. She continues to  Live at [facility]  [place] in assisted living. She is able to complete all ADLs independently. She denies any new neurological symptoms. She returns today for an evaluation.  HISTORY 10/04/14: [name] is an 82 year old female with a history of essential tremor and gait disorder. She returns today for follow-up. The patient does not require any medications for her tremor. Her tremor has been located in both hands primarily. His last visit the patient did fall around Christmas and broke her right hip. [facility] is [facility] [place] in the [facility]-living portion. She is been completing physical therapy daily. She uses a walker now to ambulate. Denies any additional falls. Denies any discomfort in the feet. She is able to complete all ADLs independently. Overall she feels that she has been doing well since her fall. She returns today for an evaluation.  HISTORY 12/21/13 ([name]): [name] is an 82 year old right-handed white female with a history of an essential tremor and a gait disorder. The patient has had issues with her balance for number of years, but she indicates that this problem has gradually worsened over time. The patient has fallen on 2 occasions, one in December of 2014, and then again in June of 2015. CT scan evaluations of the brain showed no acute changes. The patient has not report any numbness of the hands or feet. The patient uses a cane for ambulation, but she has fallen even  while using a cane. The patient indicates that she will have a tendency to fall backwards. The patient struck her head on the last fall, but she did not lose consciousness. The patient denies any blackouts with the falls. She has had some dizziness since the last fall in June of 2015. She denies any neck pain or low back pain or pain down arms or legs. She currently is in physical therapy for her balance. She is sent to this office for an evaluation.    REVIEW OF SYSTEMS: Out of a complete 14 system review of symptoms, the patient complains only of the following symptoms, and all other reviewed systems are negative.   see history of present illness  ALLERGIES: Allergies  Allergen Reactions  . Novocain [Procaine]     Passed  out  . Limbrel [Flavocoxid]     Dizziness   . Sertraline Anxiety    HOME MEDICATIONS: Outpatient Prescriptions Prior to Visit  Medication Sig Dispense Refill  . acetaminophen (TYLENOL) 650 MG CR tablet Take 650 mg by mouth 2 (two) times daily.    . Calcium Carbonate-Vitamin D (CALTRATE 600+D) 600-400 MG-UNIT per tablet Take 1 tablet by mouth daily.    . cholecalciferol (VITAMIN D) 1000 UNITS tablet Take 1,000 Units by mouth daily.    . citalopram (CELEXA) 10 MG tablet Take 10 mg by mouth daily.    Marland Kitchen enoxaparin (LOVENOX) 300 MG/3ML SOLN injection Inject 0.3 mLs (30 mg total) into the skin daily. 21 vial 0  . Misc Natural Products (  OSTEO BI-FLEX JOINT SHIELD) TABS Take 1 tablet by mouth daily.    . Multiple Vitamin (MULTIVITAMIN) tablet Take 1 tablet by mouth daily.    Marland Kitchen omeprazole (PRILOSEC) 20 MG capsule Take 20 mg by mouth daily.    . sennosides-docusate sodium (SENOKOT-S) 8.6-50 MG tablet Take 2 tablets by mouth daily. 30 tablet 1  . diclofenac sodium (VOLTAREN) 1 % GEL Apply 4 g topically 4 (four) times daily.    Marland Kitchen HYDROcodone-acetaminophen (NORCO) 5-325 MG per tablet Take 1-2 tablets by mouth every 6 (six) hours as needed for moderate pain. MAXIMUM TOTAL  ACETAMINOPHEN DOSE IS 4000 MG PER DAY 50 tablet 0   No facility-administered medications prior to visit.    PAST MEDICAL HISTORY: Past Medical History  Diagnosis Date  . Pain in joint, upper arm   . Disturbance of skin sensation   . Other abnormal clinical finding   . Disturbance of skin sensation   . Unspecified vitamin D deficiency   . Tobacco use disorder   . Chronic airway obstruction, not elsewhere classified   . Hypertension   . GERD (gastroesophageal reflux disease)   . Anemia     iron deficient  . Chronic back pain   . Chronic cystitis   . Hernia   . Depression   . Anxiety   . Essential and other specified forms of tremor 12/11/2012  . Osteoporosis   . Degenerative arthritis   . History of cerebrovascular disease     Moderate level small vessel disease  . Gait disorder   . Dyslipidemia   . Osteoarthritis     right knee injection per Dr Trudie Reed  . Hyperlipidemia     mild  . Peripheral vascular disease ([place])   . Hemorrhoids   . Esophageal reflux   . Dyspnea     Dr Chase Caller  . Rheumatoid arthritis ([place])     Dr Trudie Reed  . Cystitis, chronic     Dr Gaynelle Arabian  . Tremor     and gait disorder--Dr Jannifer Franklin  . Foot drop, bilateral 12/21/2013    PAST SURGICAL HISTORY: Past Surgical History  Procedure Laterality Date  . Back surgery Bilateral     X2  . Appendectomy    . Cataract extraction Bilateral   . Bladder resuspension procedure    . Tonsillectomy    . Inguinal hernia repair Bilateral   . Femur im nail Right 02/22/2014    Procedure: INTRAMEDULLARY (IM) NAIL FEMORAL;  Surgeon: Johnny Bridge, MD;  Location: [facility];  Service: Orthopedics;  Laterality: Right;    FAMILY HISTORY: Family History  Problem Relation Age of Onset  . Heart Problems Mother   . Diabetes Father   . Heart attack Father   . Cancer Sister     Brain tumor  . Emphysema Brother     SOCIAL HISTORY: Social History   Social History  . Marital Status: Widowed    Spouse Name: N/A  .  Number of Children: 3  . Years of Education: college   Occupational History  . SECRETARY   . Retired     Social History Main Topics  . Smoking status: Never Smoker   . Smokeless tobacco: Never Used  . Alcohol Use: No  . Drug Use: No  . Sexual Activity: Not on file   Other Topics Concern  . Not on file   Social History Narrative   Patient lives at [facility].    Patient is widowed.    Patient has  3 children.    Patient is right handed.    Patient has 2 years of Business school.       PHYSICAL EXAM  Filed Vitals:   04/06/15 1400  BP: 141/75  Pulse: 93  Height: 5\' 1"  (1.549 m)  Weight: 127 lb (57.607 kg)   Body mass index is 24.01 kg/(m^2).  Generalized: Well developed, in no acute distress   Neurological examination  Mentation: Alert oriented to time, place, history taking. Follows all commands speech and language fluent Cranial nerve II-XII: Pupils were equal round reactive to light. Extraocular movements were full, visual field were full on confrontational test. Facial sensation and strength were normal. Uvula tongue midline. Head turning and shoulder shrug  were normal and symmetric. Motor: The motor testing reveals 5 over 5 strength of all 4 extremities. Good symmetric motor tone is noted throughout.  Mild intention tremor noted in the upper extremities bilaterally Sensory: Sensory testing is intact to soft touch on all 4 extremities. No evidence of extinction is noted.  Coordination: Cerebellar testing reveals good finger-nose-finger and heel-to-shin bilaterally.  Gait and station:  Patient uses a walker when ambulating. Reflexes: Deep tendon reflexes are symmetric and normal bilaterally.   DIAGNOSTIC DATA (LABS, IMAGING, TESTING) - I reviewed patient records, labs, notes, testing and imaging myself where available.  Lab Results  Component Value Date   WBC 9.5 03/03/2014   HGB 12.1 03/03/2014   HCT 36 03/03/2014   MCV 97.5 02/24/2014   PLT 436*  03/03/2014      Component Value Date/Time   NA 140 02/28/2014   NA 140 02/24/2014 0520   K 4.4 02/28/2014   CL 102 02/24/2014 0520   CO2 28 02/24/2014 0520   GLUCOSE 107* 02/24/2014 0520   BUN 18 02/28/2014   BUN 16 02/24/2014 0520   CREATININE 0.7 02/28/2014   CREATININE 0.62 02/24/2014 0520   CALCIUM 9.1 02/24/2014 0520   PROT 6.5 07/14/2007 1842   ALBUMIN 3.6 07/14/2007 1842   AST 38* 02/28/2014   ALT 17 02/28/2014   ALKPHOS 44 02/28/2014   BILITOT 0.8 07/14/2007 1842   GFRNONAA 79* 02/24/2014 0520   GFRAA >90 02/24/2014 0520      ASSESSMENT AND PLAN 82 y.o. year old female  has a past medical history of Pain in joint, upper arm; Disturbance of skin sensation; Other abnormal clinical finding; Disturbance of skin sensation; Unspecified vitamin D deficiency; Tobacco use disorder; Chronic airway obstruction, not elsewhere classified; Hypertension; GERD (gastroesophageal reflux disease); Anemia; Chronic back pain; Chronic cystitis; Hernia; Depression; Anxiety; Essential and other specified forms of tremor (12/11/2012); Osteoporosis; Degenerative arthritis; History of cerebrovascular disease; Gait disorder; Dyslipidemia; Osteoarthritis; Hyperlipidemia; Peripheral vascular disease ([place]); Hemorrhoids; Esophageal reflux; Dyspnea; Rheumatoid arthritis ([place]); Cystitis, chronic; Tremor; and Foot drop, bilateral (12/21/2013). here with:   1. Essential tremor 2. Abnormality of gait   Overall the patient has remained stable. Her tremor has remained the same.  We will not initiate any medication at this time. She is encouraged to use her walker when ambulating. Patient advised that if her symptoms worsen or she develops any new symptoms she should let us know. She will follow-up in 6 months with Dr. Jannifer Franklin.     Ward Givens, MSN, NP-C 04/06/2015, 2:29 PM [facility] [address], [place], [place] [address] [phone]

## 2015-06-20 ENCOUNTER — Encounter: Payer: Self-pay | Admitting: Internal Medicine

## 2015-06-20 ENCOUNTER — Non-Acute Institutional Stay: Payer: Medicare Other | Admitting: Internal Medicine

## 2015-06-20 VITALS — BP 124/82 | HR 91 | Temp 97.9°F | Resp 20 | Ht 61.0 in | Wt 126.8 lb

## 2015-06-20 DIAGNOSIS — G309 Alzheimer's disease, unspecified: Secondary | ICD-10-CM

## 2015-06-20 DIAGNOSIS — S7221XS Displaced subtrochanteric fracture of right femur, sequela: Secondary | ICD-10-CM | POA: Diagnosis not present

## 2015-06-20 DIAGNOSIS — F0280 Dementia in other diseases classified elsewhere without behavioral disturbance: Secondary | ICD-10-CM | POA: Diagnosis not present

## 2015-06-20 NOTE — Progress Notes (Signed)
Patient ID: [name], female   DOB: Jul 16, 1924, 82 y.o.   MRN: [id number]   History and physical   Location:  [place] [facility]  Provider: Jeanmarie Hubert, M.D.  Code Status: Full Goals of Care:  Advanced Directives 06/20/2015  Does patient have an advance directive? Yes  Type of Paramedic of [place];Living will  Does patient want to make changes to advanced directive? No - Patient declined  Copy of advanced directive(s) in chart? No - copy requested     Chief Complaint  Patient presents with  . Establish Care    HPI: Patient is a 82 y.o. female seen today for medical management of chronic diseases.  Patient was being followed by Dr. Leighton Ruff. She is changing to our group as her primary care. She has been seen by [facility] in the past, during her rehabilitation stay in the skilled nursing facility area [facility].  Patient says that she is feeling relatively well today except for some discomfort in the right hip. This is an area of discomfort that has been present ever since she fractured her femur in December 2015. She does use a walker for gait instability.  There are problems with memory. MMSE on 09/05/2014 was only 20/30.  She has mild edema. She denies dyspnea.  Hypertension is well controlled.  Previous issues with dyspnea were evaluated by Dr. Chase Caller, pulmonologist. Pulmonary function tests were normal. He suspected her complaints of dyspnea were due to deconditioning, kyphosis, and possible secondary pulmonary hypertension. He recommended pulmonary therapy . She no longer feels short of breath except with exertion.   Past Medical History  Diagnosis Date  . Pain in joint, upper arm   . Disturbance of skin sensation   . Other abnormal clinical finding   . Disturbance of skin sensation   . Unspecified vitamin D deficiency   . Tobacco use disorder   . Chronic airway obstruction, not elsewhere classified   . Hypertension   .  GERD (gastroesophageal reflux disease)   . Anemia     iron deficient  . Chronic back pain   . Chronic cystitis   . Hernia   . Depression   . Anxiety   . Essential and other specified forms of tremor 12/11/2012  . Osteoporosis   . Degenerative arthritis   . History of cerebrovascular disease     Moderate level small vessel disease  . Gait disorder   . Dyslipidemia   . Osteoarthritis     right knee injection per Dr Trudie Reed  . Hyperlipidemia     mild  . Peripheral vascular disease ([place])   . Hemorrhoids   . Esophageal reflux   . Dyspnea     Dr Chase Caller  . Rheumatoid arthritis ([place])     Dr Trudie Reed  . Cystitis, chronic     Dr Gaynelle Arabian  . Tremor     and gait disorder--Dr Jannifer Franklin  . Foot drop, bilateral 12/21/2013  . Anemia     Past Surgical History  Procedure Laterality Date  . Back surgery Bilateral     X2  . Appendectomy    . Cataract extraction Bilateral   . Bladder resuspension procedure    . Tonsillectomy    . Inguinal hernia repair Bilateral   . Femur im nail Right 02/22/2014    Procedure: INTRAMEDULLARY (IM) NAIL FEMORAL;  Surgeon: Johnny Bridge, MD;  Location: [place];  Service: Orthopedics;  Laterality: Right;    Allergies  Allergen Reactions  .  Novocain [Procaine]     Passed  out  . Limbrel [Flavocoxid]     Dizziness   . Sertraline Anxiety      Medication List       This list is accurate as of: 06/20/15 10:21 AM.  Always use your most recent med list.               acetaminophen 650 MG CR tablet  Commonly known as:  TYLENOL  Take 650 mg by mouth 2 (two) times daily.     CALTRATE 600+D 600-400 MG-UNIT tablet  Generic drug:  Calcium Carbonate-Vitamin D  Take 1 tablet by mouth daily.     cholecalciferol 1000 units tablet  Commonly known as:  VITAMIN D  Take 1,000 Units by mouth daily.     citalopram 10 MG tablet  Commonly known as:  CELEXA  Take 10 mg by mouth daily.     multivitamin tablet  Take 1 tablet by mouth daily.     omeprazole  20 MG capsule  Commonly known as:  PRILOSEC  Take 20 mg by mouth daily.     OSTEO BI-FLEX JOINT SHIELD Tabs  Take 1 tablet by mouth daily.     sennosides-docusate sodium 8.6-50 MG tablet  Commonly known as:  SENOKOT-S  Take 2 tablets by mouth daily.        Review of Systems:  Review of Systems  Constitutional: Negative for fever, chills, diaphoresis, activity change, appetite change, fatigue and unexpected weight change.  HENT: Positive for hearing loss. Negative for congestion, ear discharge, ear pain, nosebleeds, postnasal drip, rhinorrhea, sore throat, tinnitus, trouble swallowing and voice change.   Eyes: Negative for photophobia, pain, discharge, redness, itching and visual disturbance.  Respiratory: Negative for cough, choking, shortness of breath, wheezing and stridor.   Cardiovascular: Positive for leg swelling. Negative for chest pain and palpitations.       Right leg  Gastrointestinal: Negative for nausea, vomiting, abdominal pain, diarrhea, constipation, blood in stool and abdominal distention.  Endocrine: Negative for cold intolerance, heat intolerance, polydipsia, polyphagia and polyuria.  Genitourinary: Positive for frequency. Negative for dysuria, urgency, hematuria, flank pain, vaginal discharge, difficulty urinating and pelvic pain.  Musculoskeletal: Negative for myalgias, back pain, arthralgias, gait problem, neck pain and neck stiffness.       Right hip pain-ambulates with walker. Fx Dec 2015.  Skin: Negative for color change, pallor and rash.  Allergic/Immunologic: Negative.  Negative for environmental allergies.  Neurological: Negative for dizziness, tremors, seizures, syncope, weakness, numbness and headaches.       Memory deficit  Hematological: Negative for adenopathy. Does not bruise/bleed easily.  Psychiatric/Behavioral: Negative for suicidal ideas, hallucinations, behavioral problems, confusion, sleep disturbance, dysphoric mood and agitation. The patient is  nervous/anxious. The patient is not hyperactive.     Health Maintenance  Topic Date Due  . ZOSTAVAX  04/13/1986  . PNA vac Low Risk Adult (2 of 2 - PCV13) 01/22/2007  . TETANUS/TDAP  03/18/2008  . INFLUENZA VACCINE  10/17/2015  . DEXA SCAN  Completed    Physical Exam: Filed Vitals:   06/20/15 0956  BP: 124/82  Pulse: 91  Temp: 97.9 F (36.6 C)  TempSrc: Oral  Resp: 20  Height: 5\' 1"  (1.549 m)  Weight: 126 lb 12.8 oz (57.516 kg)  SpO2: 95%   Body mass index is 23.97 kg/(m^2). Physical Exam  Constitutional: She appears well-developed and well-nourished. No distress.  HENT:  Head: Normocephalic and atraumatic.  Right Ear: External ear normal.  Left Ear: External ear normal.  Nose: Nose normal.  Mouth/Throat: Oropharynx is clear and moist. No oropharyngeal exudate.  Eyes: Conjunctivae and EOM are normal. Pupils are equal, round, and reactive to light. Right eye exhibits no discharge. Left eye exhibits no discharge. No scleral icterus.  Neck: Normal range of motion. Neck supple. No JVD present. No tracheal deviation present. No thyromegaly present.  Cardiovascular: Normal rate, regular rhythm and intact distal pulses.  Exam reveals no gallop and no friction rub.   Murmur heard.  Systolic murmur is present with a grade of 2/6  Pulmonary/Chest: Effort normal and breath sounds normal. No stridor. No respiratory distress. She has no wheezes. She has no rales. She exhibits no tenderness.  Abdominal: Soft. She exhibits no distension and no mass. There is no tenderness. There is no rebound and no guarding.  Musculoskeletal: Normal range of motion. She exhibits edema and tenderness.  RLE trace. Pain in the R hip with ROM and weight bearing. Using walker.  Kyphosis.   Lymphadenopathy:    She has no cervical adenopathy.  Neurological: She is alert. She has normal reflexes. No cranial nerve deficit. She exhibits normal muscle tone. Coordination normal.  09/05/14 MMSE 20/30  Skin:  Skin is warm and dry. No rash noted. She is not diaphoretic. No erythema. No pallor.  Psychiatric: She has a normal mood and affect. Her behavior is normal. Judgment and thought content normal.    Labs reviewed: Basic Metabolic Panel: No results for input(s): NA, K, CL, CO2, GLUCOSE, BUN, CREATININE, CALCIUM, MG, PHOS, TSH in the last 8760 hours. Liver Function Tests: No results for input(s): AST, ALT, ALKPHOS, BILITOT, PROT, ALBUMIN in the last 8760 hours. No results for input(s): LIPASE, AMYLASE in the last 8760 hours. No results for input(s): AMMONIA in the last 8760 hours. CBC: No results for input(s): WBC, NEUTROABS, HGB, HCT, MCV, PLT in the last 8760 hours. Lipid Panel: No results for input(s): CHOL, HDL, LDLCALC, TRIG, CHOLHDL, LDLDIRECT in the last 8760 hours. No results found for: HGBA1C  Procedures since last visit: No results found.  Assessment/Plan   1. Alzheimer's dementia without behavioral disturbance, unspecified timing of dementia onset Etiology of her dementia is not 100% clear. She does have evidence of small vessel disease and prior brain scans. There has been slow decline in her abilities over the last 2 years. I think Alzheimer's is the dominant issue although there may be a cerebrovascular problems as well.  2. Fracture, subtrochanteric, right femur, closed, sequela Fractured H back to December 2015 . Residual discomfort in the right hip area when she walks.   Patient is currently in the assisted-living area of [facility]. This is the appropriate area for her to be. She is not capable of safely independent living. She does need assistance with medications, medial provision, and personal care at times. She appears to been medically stable over the last several months.

## 2015-06-22 ENCOUNTER — Encounter: Payer: Self-pay | Admitting: Internal Medicine

## 2015-06-22 DIAGNOSIS — F0281 Dementia in other diseases classified elsewhere with behavioral disturbance: Secondary | ICD-10-CM | POA: Insufficient documentation

## 2015-06-22 DIAGNOSIS — G309 Alzheimer's disease, unspecified: Secondary | ICD-10-CM

## 2015-06-22 DIAGNOSIS — F0280 Dementia in other diseases classified elsewhere without behavioral disturbance: Secondary | ICD-10-CM

## 2015-06-22 DIAGNOSIS — F02818 Dementia in other diseases classified elsewhere, unspecified severity, with other behavioral disturbance: Secondary | ICD-10-CM | POA: Insufficient documentation

## 2015-06-22 HISTORY — DX: Dementia in other diseases classified elsewhere, unspecified severity, without behavioral disturbance, psychotic disturbance, mood disturbance, and anxiety: F02.80

## 2015-10-11 ENCOUNTER — Ambulatory Visit (INDEPENDENT_AMBULATORY_CARE_PROVIDER_SITE_OTHER): Payer: Medicare Other | Admitting: Neurology

## 2015-10-11 ENCOUNTER — Encounter: Payer: Self-pay | Admitting: Neurology

## 2015-10-11 VITALS — BP 157/82 | HR 85 | Ht 61.0 in | Wt 126.0 lb

## 2015-10-11 DIAGNOSIS — G250 Essential tremor: Secondary | ICD-10-CM | POA: Diagnosis not present

## 2015-10-11 DIAGNOSIS — G609 Hereditary and idiopathic neuropathy, unspecified: Secondary | ICD-10-CM | POA: Insufficient documentation

## 2015-10-11 DIAGNOSIS — R269 Unspecified abnormalities of gait and mobility: Secondary | ICD-10-CM

## 2015-10-11 HISTORY — DX: Hereditary and idiopathic neuropathy, unspecified: G60.9

## 2015-10-11 NOTE — Patient Instructions (Addendum)
Tremor °A tremor is trembling or shaking that you cannot control. Most tremors affect the hands or arms. Tremors can also affect the head, vocal cords, face, and other parts of the body. There are many types of tremors. Common types include:  °· Essential tremor. These usually occur in people over the age of 40. It may run in families and can happen in otherwise healthy people.   °· Resting tremor. These occur when the muscles are at rest, such as when your hands are resting in your lap. People with Parkinson disease often have resting tremors.   °· Postural tremor. These occur when you try to hold a pose, such as keeping your hands outstretched.   °· Kinetic tremor. These occur during purposeful movement, such as trying to touch a finger to your nose.   °· Task-specific tremor. These may occur when you perform tasks such as handwriting, speaking, or standing.   °· Psychogenic tremor. These dramatically lessen or disappear when you are distracted. They can happen in people of all ages.   °Some types of tremors have no known cause. Tremors can also be a symptom of nervous system problems (neurological disorders) that may occur with aging. Some tremors go away with treatment while others do not.  °HOME CARE INSTRUCTIONS °Watch your tremor for any changes. The following actions may help to lessen any discomfort you are feeling: °· Take medicines only as directed by your health care provider.   °· Limit alcohol intake to no more than 1 drink per day for nonpregnant women and 2 drinks per day for men. One drink equals 12 oz of beer, 5 oz of wine, or 1½ oz of hard liquor. °· Do not use any tobacco products, including cigarettes, chewing tobacco, or electronic cigarettes. If you need help quitting, ask your health care provider.   °· Avoid extreme heat or cold.    °· Limit the amount of caffeine you consume as directed by your health care provider.   °· Try to get 8 hours of sleep each night. °· Find ways to manage your  stress, such as meditation or yoga. °· Keep all follow-up visits as directed by your health care provider. This is important. °SEEK MEDICAL CARE IF: °· You start having a tremor after starting a new medicine. °· You have tremor with other symptoms such as: °¨ Numbness. °¨ Tingling. °¨ Pain. °¨ Weakness. °· Your tremor gets worse. °· Your tremor interferes with your day-to-day life. °  °This information is not intended to replace advice given to you by your health care provider. Make sure you discuss any questions you have with your health care provider. °  °Document Released: 02/22/2002 Document Revised: 03/25/2014 Document Reviewed: 08/30/2013 °Elsevier Interactive Patient Education ©2016 [employer] ° °

## 2015-10-11 NOTE — Progress Notes (Signed)
Reason for visit: Tremor, gait disorder  [name] is an 82 y.o. female  History of present illness:  [name] is an 82 year old right-handed white female with a history of an essential tremor affecting the upper extremities. The patient indicates that she really does not notice the tremor, the tremor does not affect her abilities to perform her activities of daily living. The patient has a chronic gait disorder, she has been found to have a peripheral neuropathy, she indicates that she does not have any discomfort or pain in the legs associated with the neuropathy. She reports one fall since last seen in January 2017. The patient has had some mid back pain following a fall. The patient has not fallen recently. She fell when she tried to get to the bathroom at nighttime and she believes that she may have fallen backwards. The patient returns to this office for an evaluation. She is on no medications for the tremor.  Past Medical History:  Diagnosis Date  . Alzheimer's disease 06/22/2015  . Anemia    iron deficient  . Anemia   . Anxiety   . Chronic airway obstruction, not elsewhere classified   . Chronic back pain   . Chronic cystitis   . Cystitis, chronic    Dr Gaynelle Arabian  . Degenerative arthritis   . Depression   . Disturbance of skin sensation   . Disturbance of skin sensation   . Dyslipidemia   . Dyspnea    Dr Chase Caller  . Esophageal reflux   . Essential and other specified forms of tremor 12/11/2012  . Foot drop, bilateral 12/21/2013  . Gait disorder   . GERD (gastroesophageal reflux disease)   . Hemorrhoids   . Hernia   . History of cerebrovascular disease    Moderate level small vessel disease  . Hyperlipidemia    mild  . Hypertension   . Osteoarthritis    right knee injection per Dr Trudie Reed  . Osteoporosis   . Other abnormal clinical finding   . Pain in joint, upper arm   . Peripheral vascular disease ([place])   . Rheumatoid arthritis ([place])    Dr Trudie Reed  .  Tobacco use disorder   . Tremor    and gait disorder--Dr Jannifer Franklin  . Unspecified vitamin D deficiency     Past Surgical History:  Procedure Laterality Date  . APPENDECTOMY    . BACK SURGERY Bilateral    X2  . Bladder resuspension procedure    . CATARACT EXTRACTION Bilateral   . FEMUR IM NAIL Right 02/22/2014   Procedure: INTRAMEDULLARY (IM) NAIL FEMORAL;  Surgeon: Johnny Bridge, MD;  Location: [place];  Service: Orthopedics;  Laterality: Right;  . INGUINAL HERNIA REPAIR Bilateral   . TONSILLECTOMY      Family History  Problem Relation Age of Onset  . Heart Problems Mother   . Diabetes Father   . Heart attack Father   . Cancer Sister     Brain tumor  . Emphysema Brother   . COPD Son     Social history:  reports that she has never smoked. She has never used smokeless tobacco. She reports that she does not drink alcohol or use drugs.    Allergies  Allergen Reactions  . Novocain [Procaine]     Passed  out  . Limbrel [Flavocoxid]     Dizziness   . Sertraline Anxiety    Medications:  Prior to Admission medications   Medication Sig Start Date End  Date Taking? Authorizing Provider  acetaminophen (TYLENOL) 650 MG CR tablet Take 650 mg by mouth 2 (two) times daily.    Historical Provider, MD  Calcium Carbonate-Vitamin D (CALTRATE 600+D) 600-400 MG-UNIT per tablet Take 1 tablet by mouth daily.    Historical Provider, MD  cholecalciferol (VITAMIN D) 1000 UNITS tablet Take 1,000 Units by mouth daily.    Historical Provider, MD  citalopram (CELEXA) 10 MG tablet Take 10 mg by mouth daily.    Historical Provider, MD  Misc Natural Products (OSTEO BI-FLEX JOINT SHIELD) TABS Take 1 tablet by mouth daily.    Historical Provider, MD  Multiple Vitamin (MULTIVITAMIN) tablet Take 1 tablet by mouth daily.    Historical Provider, MD  omeprazole (PRILOSEC) 20 MG capsule Take 20 mg by mouth daily.    Historical Provider, MD  sennosides-docusate sodium (SENOKOT-S) 8.6-50 MG tablet Take 2 tablets  by mouth daily. 02/22/14   Marchia Bond, MD    ROS:  Out of a complete 14 system review of symptoms, the patient complains only of the following symptoms, and all other reviewed systems are negative.  Tremors Walking problems  There were no vitals taken for this visit.  Physical Exam  General: The patient is alert and cooperative at the time of the examination.  Skin: 1+ edema of ankles is noted bilaterally.   Neurologic Exam  Mental status: The patient is alert and oriented x 3 at the time of the examination. The patient has apparent normal recent and remote memory, with an apparently normal attention span and concentration ability.   Cranial nerves: Facial symmetry is present. Speech is normal, no aphasia or dysarthria is noted. Extraocular movements are full. Visual fields are full.  Motor: The patient has good strength in all 4 extremities.  Sensory examination: Soft touch sensation is symmetric on the face, arms, and legs.  Coordination: The patient has good finger-nose-finger and heel-to-shin bilaterally. Mild-to-moderate intention tremors are seen with finger-nose-finger bilaterally.  Gait and station: The patient has a slightly wide-based gait, the patient uses a walker for ambulation. Tandem gait was not attempted. Romberg is negative.  Reflexes: Deep tendon reflexes are symmetric.   Assessment/Plan:  1. Essential tremor  2. Gait disorder  3. Peripheral neuropathy  The patient seems to be doing fairly well. She requires no medication for tremor or for the peripheral neuropathy. She is using a walker, she is relatively safe with her ability to ambulate. At this point, we will have the patient come back to this office if needed. If there are any concerns, she is to call our office.  Jill Alexanders MD 10/11/2015 11:15 AM  [facility] [address] [place], [place] [address]  Phone [phone] Fax [phone]

## 2015-11-16 ENCOUNTER — Encounter: Payer: Self-pay | Admitting: Nurse Practitioner

## 2015-11-16 ENCOUNTER — Non-Acute Institutional Stay: Payer: Medicare Other | Admitting: Nurse Practitioner

## 2015-11-16 DIAGNOSIS — F329 Major depressive disorder, single episode, unspecified: Secondary | ICD-10-CM | POA: Diagnosis not present

## 2015-11-16 DIAGNOSIS — F0280 Dementia in other diseases classified elsewhere without behavioral disturbance: Secondary | ICD-10-CM

## 2015-11-16 DIAGNOSIS — K649 Unspecified hemorrhoids: Secondary | ICD-10-CM | POA: Diagnosis not present

## 2015-11-16 DIAGNOSIS — R609 Edema, unspecified: Secondary | ICD-10-CM

## 2015-11-16 DIAGNOSIS — G308 Other Alzheimer's disease: Secondary | ICD-10-CM

## 2015-11-16 DIAGNOSIS — K5900 Constipation, unspecified: Secondary | ICD-10-CM

## 2015-11-16 DIAGNOSIS — N390 Urinary tract infection, site not specified: Secondary | ICD-10-CM | POA: Diagnosis not present

## 2015-11-16 DIAGNOSIS — K219 Gastro-esophageal reflux disease without esophagitis: Secondary | ICD-10-CM | POA: Diagnosis not present

## 2015-11-16 DIAGNOSIS — K648 Other hemorrhoids: Secondary | ICD-10-CM | POA: Insufficient documentation

## 2015-11-16 DIAGNOSIS — F32A Depression, unspecified: Secondary | ICD-10-CM

## 2015-11-16 NOTE — Progress Notes (Signed)
Location:   [facility] Room Number: 18 Place of Service:  ALF [phone]) Provider:  Dustina Scoggin, Manxie  NP  Jeanmarie Hubert, MD  Patient Care Team: Estill Dooms, MD as PCP - General (Internal Medicine) Jettie Booze, MD as Consulting Physician (Cardiology) Marchia Bond, MD as Consulting Physician (Orthopedic Surgery) Kathrynn Ducking, MD as Consulting Physician (Neurology) Brand Males, MD as Consulting Physician (Pulmonary Disease) Clema Skousen Otho Darner, NP as Nurse Practitioner (Internal Medicine)  Extended Emergency Contact Information Primary Emergency Contact: [facility] Address: [address]          [place], [place] [address] [place] Phone: [phone] Mobile Phone: [phone] Relation: Son Secondary Emergency Contact: [place] Mobile Phone: [phone] Relation: Son  Code Status:  DNR Goals of care: Advanced Directive information Advanced Directives 11/16/2015  Does patient have an advance directive? Yes  Type of Advance Directive Out of facility DNR (pink MOST or yellow form);Living will;Healthcare Power of Attorney  Does patient want to make changes to advanced directive? No - Patient declined  Copy of advanced directive(s) in chart? Yes     Chief Complaint  Patient presents with  . Acute Visit    Increased stomache pain, legs swollen and area around rectum swollen, blood seen in the toilet after BM    HPI:  Pt is a 82 y.o. female seen today for an acute visit for c/o stomach pain, swelling in her legs, denied vomiting, diarrhea, or constipation, denied chest pain, palpitation, or SOB, staff reported observed blood in toilet when having BM, no vaginal or rectal bleed noted upon my exam.    Hx of depression/anxiety stable taking Celexa 10mg , GERD stable on Omeprazole, constipation is managed with Senokot II tabs daily Past Medical History:  Diagnosis Date  . Alzheimer's disease  06/22/2015  . Anemia    iron deficient  . Anemia   . Anxiety   . Chronic airway obstruction, not elsewhere classified   . Chronic back pain   . Chronic cystitis   . Cystitis, chronic    Dr Gaynelle Arabian  . Degenerative arthritis   . Depression   . Disturbance of skin sensation   . Disturbance of skin sensation   . Dyslipidemia   . Dyspnea    Dr Chase Caller  . Esophageal reflux   . Essential and other specified forms of tremor 12/11/2012  . Foot drop, bilateral 12/21/2013  . Gait disorder   . GERD (gastroesophageal reflux disease)   . Hemorrhoids   . Hereditary and idiopathic peripheral neuropathy 10/11/2015  . Hernia   . History of cerebrovascular disease    Moderate level small vessel disease  . Hyperlipidemia    mild  . Hypertension   . Osteoarthritis    right knee injection per Dr Trudie Reed  . Osteoporosis   . Other abnormal clinical finding   . Pain in joint, upper arm   . Peripheral vascular disease ([place])   . Rheumatoid arthritis ([place])    Dr Trudie Reed  . Tobacco use disorder   . Tremor    and gait disorder--Dr Jannifer Franklin  . Unspecified vitamin D deficiency    Past Surgical History:  Procedure Laterality Date  . APPENDECTOMY    . BACK SURGERY Bilateral    X2  . Bladder resuspension procedure    . CATARACT EXTRACTION Bilateral   . FEMUR IM NAIL Right 02/22/2014   Procedure: INTRAMEDULLARY (IM) NAIL FEMORAL;  Surgeon: Johnny Bridge, MD;  Location: [facility];  Service: Orthopedics;  Laterality: Right;  . INGUINAL HERNIA REPAIR Bilateral   . TONSILLECTOMY      Allergies  Allergen Reactions  . Novocain [Procaine]     Passed  out  . Limbrel [Flavocoxid]     Dizziness   . Sertraline Anxiety      Medication List       Accurate as of 11/16/15  4:46 PM. Always use your most recent med list.          acetaminophen 650 MG CR tablet Commonly known as:  TYLENOL Take 650 mg by mouth 2 (two) times daily.   CALTRATE 600+D 600-400 MG-UNIT tablet Generic drug:  Calcium  Carbonate-Vitamin D Take 1 tablet by mouth daily.   citalopram 10 MG tablet Commonly known as:  CELEXA Take 10 mg by mouth daily.   multivitamin tablet Take 1 tablet by mouth daily.   omeprazole 20 MG capsule Commonly known as:  PRILOSEC Take 20 mg by mouth daily.   OSTEO BI-FLEX JOINT SHIELD Tabs Take 1 tablet by mouth daily.   sennosides-docusate sodium 8.6-50 MG tablet Commonly known as:  SENOKOT-S Take 2 tablets by mouth daily.   Vitamin D3 10000 units Tabs Take 1 tablet by mouth daily.       Review of Systems  Constitutional: Negative for activity change, appetite change, chills, diaphoresis, fatigue, fever and unexpected weight change.  HENT: Positive for hearing loss. Negative for congestion, ear discharge, ear pain, nosebleeds, postnasal drip, rhinorrhea, sore throat, tinnitus, trouble swallowing and voice change.   Eyes: Negative for photophobia, pain, discharge, redness, itching and visual disturbance.  Respiratory: Negative for cough, choking, shortness of breath, wheezing and stridor.   Cardiovascular: Positive for leg swelling. Negative for chest pain and palpitations.       Right leg  Gastrointestinal: Positive for abdominal pain and anal bleeding. Negative for abdominal distention, blood in stool, constipation, diarrhea, nausea and vomiting.       Stomach aches in am. Blood seen in toilet with BM  Endocrine: Negative for cold intolerance, heat intolerance, polydipsia, polyphagia and polyuria.  Genitourinary: Positive for frequency. Negative for difficulty urinating, dysuria, flank pain, hematuria, pelvic pain, urgency and vaginal discharge.  Musculoskeletal: Negative for arthralgias, back pain, gait problem, myalgias, neck pain and neck stiffness.       Right hip pain-ambulates with walker. Fx Dec 2015.  Skin: Negative for color change, pallor and rash.  Allergic/Immunologic: Negative.  Negative for environmental allergies.  Neurological: Negative for  dizziness, tremors, seizures, syncope, weakness, numbness and headaches.       Memory deficit  Hematological: Negative for adenopathy. Does not bruise/bleed easily.  Psychiatric/Behavioral: Negative for agitation, behavioral problems, confusion, dysphoric mood, hallucinations, sleep disturbance and suicidal ideas. The patient is nervous/anxious. The patient is not hyperactive.     Immunization History  Administered Date(s) Administered  . Influenza Whole 01/16/2010  . Influenza-Unspecified 12/22/2014  . Pneumococcal Polysaccharide-23 01/21/2006  . Pneumococcal-Unspecified 12/19/1998  . Td 03/18/1998   Pertinent  Health Maintenance Due  Topic Date Due  . PNA vac Low Risk Adult (2 of 2 - PCV13) 01/22/2007  . INFLUENZA VACCINE  10/17/2015  . DEXA SCAN  Completed   Fall Risk  06/20/2015  Falls in the past year? No  Risk for fall due to : Impaired balance/gait   Functional Status Survey:    Vitals:   11/16/15 1003  BP: 137/82  Pulse: 99  Resp: 17  Temp: 99.1 F (37.3 C)  Weight: 127 lb 3.2 oz (57.7 kg)  Height: 5\' 1"  (1.549 m)   Body mass index is 24.03 kg/m. Physical Exam  Constitutional: She appears well-developed and well-nourished. No distress.  HENT:  Head: Normocephalic and atraumatic.  Right Ear: External ear normal.  Left Ear: External ear normal.  Nose: Nose normal.  Mouth/Throat: Oropharynx is clear and moist. No oropharyngeal exudate.  Eyes: Conjunctivae and EOM are normal. Pupils are equal, round, and reactive to light. Right eye exhibits no discharge. Left eye exhibits no discharge. No scleral icterus.  Neck: Normal range of motion. Neck supple. No JVD present. No tracheal deviation present. No thyromegaly present.  Cardiovascular: Normal rate, regular rhythm and intact distal pulses.  Exam reveals no gallop and no friction rub.   Murmur heard.  Systolic murmur is present with a grade of 2/6  Pulmonary/Chest: Effort normal and breath sounds normal. No  stridor. No respiratory distress. She has no wheezes. She has no rales. She exhibits no tenderness.  Abdominal: Soft. She exhibits no distension and no mass. There is no tenderness. There is no rebound and no guarding.  Stomach aches in am. Blood seen in toilet with BM, internal hemorrhoids-no blood seen on digital rectal check. UA pending  Genitourinary: Vagina normal.  Musculoskeletal: Normal range of motion. She exhibits edema and tenderness.  RLE trace. Pain in the R hip with ROM and weight bearing. Using walker.  Kyphosis.   Lymphadenopathy:    She has no cervical adenopathy.  Neurological: She is alert. She has normal reflexes. No cranial nerve deficit. She exhibits normal muscle tone. Coordination normal.  09/05/14 MMSE 20/30  Skin: Skin is warm and dry. No rash noted. She is not diaphoretic. No erythema. No pallor.  Psychiatric: She has a normal mood and affect. Her behavior is normal. Judgment and thought content normal.    Labs reviewed: No results for input(s): NA, K, CL, CO2, GLUCOSE, BUN, CREATININE, CALCIUM, MG, PHOS in the last 8760 hours. No results for input(s): AST, ALT, ALKPHOS, BILITOT, PROT, ALBUMIN in the last 8760 hours. No results for input(s): WBC, NEUTROABS, HGB, HCT, MCV, PLT in the last 8760 hours. Lab Results  Component Value Date   TSH 2.13 02/28/2014   No results found for: HGBA1C No results found for: CHOL, HDL, LDLCALC, LDLDIRECT, TRIG, CHOLHDL  Significant Diagnostic Results in last 30 days:  No results found.  Assessment/Plan There are no diagnoses linked to this encounter.GERD (gastroesophageal reflux disease) C/o stomach aches in am, Mylanta not effective, will increase Omeprazole to 20mg  bid for now, may consider GI consult if no better, obtain CBC, CMP, UA C/S  Constipation Takes Senokot II hs  Depression Hx of depression, stable, continue Celexa 10mg       Alzheimer's disease Functioning well in AL  Internal hemorrhoids Avoid  constipation, 1% hydrocortisone cream bid to rectal/annal area, update CBC  Edema BLE/feet, will obtain BNP to evaluate further.      Family/ staff Communication: continue AL for care assistance.   Labs/tests ordered:  CBC, CMP, BNP, UA C/S

## 2015-11-16 NOTE — Assessment & Plan Note (Signed)
Avoid constipation, 1% hydrocortisone cream bid to rectal/annal area, update CBC

## 2015-11-16 NOTE — Assessment & Plan Note (Signed)
Hx of depression, stable, continue Celexa 10mg 

## 2015-11-16 NOTE — Assessment & Plan Note (Signed)
BLE/feet, will obtain BNP to evaluate further.

## 2015-11-16 NOTE — Assessment & Plan Note (Signed)
Takes Senokot II hs   

## 2015-11-16 NOTE — Assessment & Plan Note (Addendum)
C/o stomach aches in am, Mylanta not effective, will increase Omeprazole to 20mg  bid for now, may consider GI consult if no better, obtain CBC, CMP, UA C/S

## 2015-11-16 NOTE — Assessment & Plan Note (Signed)
Functioning well in AL

## 2015-11-17 DIAGNOSIS — I10 Essential (primary) hypertension: Secondary | ICD-10-CM | POA: Diagnosis not present

## 2015-11-17 DIAGNOSIS — D649 Anemia, unspecified: Secondary | ICD-10-CM | POA: Diagnosis not present

## 2015-11-17 DIAGNOSIS — I5023 Acute on chronic systolic (congestive) heart failure: Secondary | ICD-10-CM | POA: Diagnosis not present

## 2015-11-17 DIAGNOSIS — R609 Edema, unspecified: Secondary | ICD-10-CM | POA: Diagnosis not present

## 2015-11-17 LAB — HEPATIC FUNCTION PANEL
ALT: 11 U/L (ref 7–35)
AST: 17 U/L (ref 13–35)
Alkaline Phosphatase: 71 U/L (ref 25–125)
Bilirubin, Total: 0.8 mg/dL

## 2015-11-17 LAB — BASIC METABOLIC PANEL
BUN: 17 mg/dL (ref 4–21)
Creatinine: 0.8 mg/dL (ref <=1.1)
Glucose: 111 mg/dL
Potassium: 4 mmol/L (ref 3.4–5.3)
Sodium: 141 mmol/L (ref 137–147)

## 2015-11-17 LAB — CBC AND DIFFERENTIAL
HEMATOCRIT: 42 % (ref 36–46)
HEMOGLOBIN: 14 g/dL (ref 12.0–16.0)
PLATELETS: 293 10*3/uL (ref 150–399)
WBC: 9.3 10*3/mL

## 2015-11-21 ENCOUNTER — Other Ambulatory Visit: Payer: Self-pay | Admitting: *Deleted

## 2015-11-23 ENCOUNTER — Encounter: Payer: Self-pay | Admitting: Nurse Practitioner

## 2015-11-23 ENCOUNTER — Non-Acute Institutional Stay: Payer: Medicare Other | Admitting: Nurse Practitioner

## 2015-11-23 DIAGNOSIS — F329 Major depressive disorder, single episode, unspecified: Secondary | ICD-10-CM

## 2015-11-23 DIAGNOSIS — N390 Urinary tract infection, site not specified: Secondary | ICD-10-CM

## 2015-11-23 DIAGNOSIS — R609 Edema, unspecified: Secondary | ICD-10-CM | POA: Diagnosis not present

## 2015-11-23 DIAGNOSIS — G308 Other Alzheimer's disease: Secondary | ICD-10-CM

## 2015-11-23 DIAGNOSIS — K219 Gastro-esophageal reflux disease without esophagitis: Secondary | ICD-10-CM

## 2015-11-23 DIAGNOSIS — F0280 Dementia in other diseases classified elsewhere without behavioral disturbance: Secondary | ICD-10-CM

## 2015-11-23 DIAGNOSIS — G609 Hereditary and idiopathic neuropathy, unspecified: Secondary | ICD-10-CM | POA: Diagnosis not present

## 2015-11-23 DIAGNOSIS — K5900 Constipation, unspecified: Secondary | ICD-10-CM | POA: Diagnosis not present

## 2015-11-23 DIAGNOSIS — G250 Essential tremor: Secondary | ICD-10-CM | POA: Diagnosis not present

## 2015-11-23 DIAGNOSIS — F32A Depression, unspecified: Secondary | ICD-10-CM

## 2015-11-23 DIAGNOSIS — R319 Hematuria, unspecified: Secondary | ICD-10-CM | POA: Diagnosis not present

## 2015-11-23 NOTE — Assessment & Plan Note (Signed)
Not disabling.   

## 2015-11-23 NOTE — Assessment & Plan Note (Signed)
chronic

## 2015-11-23 NOTE — Assessment & Plan Note (Signed)
Hx of depression, stable, continue Celexa 10mg 

## 2015-11-23 NOTE — Assessment & Plan Note (Signed)
Functioning well in AL

## 2015-11-23 NOTE — Assessment & Plan Note (Signed)
C/o stomach aches in am, Mylanta not effective, increased Omeprazole to 20mg  bid has little effect, may consider GI consult if no better, GI cocktail prn, Carafate 28ml bid

## 2015-11-23 NOTE — Assessment & Plan Note (Signed)
Fully treated, still c/o stomach pain, but no further hematuria.

## 2015-11-23 NOTE — Progress Notes (Signed)
Location:   [facility] Room Number: 18 Place of Service:  ALF [phone]) Provider:  Mast, Manxie  NP Jeanmarie Hubert, MD  Patient Care Team: Estill Dooms, MD as PCP - General (Internal Medicine) Jettie Booze, MD as Consulting Physician (Cardiology) Marchia Bond, MD as Consulting Physician (Orthopedic Surgery) Kathrynn Ducking, MD as Consulting Physician (Neurology) Brand Males, MD as Consulting Physician (Pulmonary Disease) Man Otho Darner, NP as Nurse Practitioner (Internal Medicine)  Extended Emergency Contact Information Primary Emergency Contact: [facility] Address: [address]          [place], [place] [address] [place] Phone: [phone] Mobile Phone: [phone] Relation: Son Secondary Emergency Contact: [place] of [place] Mobile Phone: [phone] Relation: Son  Code Status:  DNR Goals of care: Advanced Directive information Advanced Directives 11/23/2015  Does patient have an advance directive? Yes  Type of Advance Directive Living will;Healthcare Power of [place];Out of facility DNR (pink MOST or yellow form)  Does patient want to make changes to advanced directive? No - Patient declined  Copy of advanced directive(s) in chart? Yes     Chief Complaint  Patient presents with  . Acute Visit    Still having stomach pains, nauseated in the morning.    HPI:  Pt is a 82 y.o. female seen today for an acute visit for persisted stomach in morning, no pain upon my examination   Resolved hematuria after UTI is fully treated.     Hx of depression/anxiety stable taking Celexa 10mg , GERD stable on Omeprazole, constipation is managed with Senokot II tabs daily. Chronic BLE edema, last BNP 11/18/15 103.1  Past Medical History:  Diagnosis Date  . Alzheimer's disease 06/22/2015  . Anemia    iron deficient  . Anemia   . Anxiety   . Chronic airway obstruction, not elsewhere classified   .  Chronic back pain   . Chronic cystitis   . Cystitis, chronic    Dr Gaynelle Arabian  . Degenerative arthritis   . Depression   . Disturbance of skin sensation   . Disturbance of skin sensation   . Dyslipidemia   . Dyspnea    Dr Chase Caller  . Esophageal reflux   . Essential and other specified forms of tremor 12/11/2012  . Foot drop, bilateral 12/21/2013  . Gait disorder   . GERD (gastroesophageal reflux disease)   . Hemorrhoids   . Hereditary and idiopathic peripheral neuropathy 10/11/2015  . Hernia   . History of cerebrovascular disease    Moderate level small vessel disease  . Hyperlipidemia    mild  . Hypertension   . Osteoarthritis    right knee injection per Dr Trudie Reed  . Osteoporosis   . Other abnormal clinical finding   . Pain in joint, upper arm   . Peripheral vascular disease ([place])   . Rheumatoid arthritis ([place])    Dr Trudie Reed  . Tobacco use disorder   . Tremor    and gait disorder--Dr Jannifer Franklin  . Unspecified vitamin D deficiency    Past Surgical History:  Procedure Laterality Date  . APPENDECTOMY    . BACK SURGERY Bilateral    X2  . Bladder resuspension procedure    . CATARACT EXTRACTION Bilateral   . FEMUR IM NAIL Right 02/22/2014   Procedure: INTRAMEDULLARY (IM) NAIL FEMORAL;  Surgeon: Johnny Bridge, MD;  Location: [facility];  Service: Orthopedics;  Laterality: Right;  . INGUINAL HERNIA REPAIR Bilateral   .  TONSILLECTOMY      Allergies  Allergen Reactions  . Novocain [Procaine]     Passed  out  . Limbrel [Flavocoxid]     Dizziness   . Sertraline Anxiety      Medication List       Accurate as of 11/23/15 12:40 PM. Always use your most recent med list.          acetaminophen 650 MG CR tablet Commonly known as:  TYLENOL Take 650 mg by mouth 2 (two) times daily.   CALTRATE 600+D 600-400 MG-UNIT tablet Generic drug:  Calcium Carbonate-Vitamin D Take 1 tablet by mouth daily.   multivitamin tablet Take 1 tablet by mouth daily.   nitrofurantoin  (macrocrystal-monohydrate) 100 MG capsule Commonly known as:  MACROBID Take 100 mg by mouth 2 (two) times daily.   omeprazole 20 MG capsule Commonly known as:  PRILOSEC Take 20 mg by mouth 2 (two) times daily before a meal.   OSTEO BI-FLEX JOINT SHIELD Tabs Take 1 tablet by mouth daily.   saccharomyces boulardii 250 MG capsule Commonly known as:  FLORASTOR Take 250 mg by mouth 2 (two) times daily.   sennosides-docusate sodium 8.6-50 MG tablet Commonly known as:  SENOKOT-S Take 2 tablets by mouth daily.   Vitamin D3 10000 units Tabs Take 1 tablet by mouth daily.       Review of Systems  Constitutional: Negative for activity change, appetite change, chills, diaphoresis, fatigue, fever and unexpected weight change.  HENT: Positive for hearing loss. Negative for congestion, ear discharge, ear pain, nosebleeds, postnasal drip, rhinorrhea, sore throat, tinnitus, trouble swallowing and voice change.   Eyes: Negative for photophobia, pain, discharge, redness, itching and visual disturbance.  Respiratory: Negative for cough, choking, shortness of breath, wheezing and stridor.   Cardiovascular: Positive for leg swelling. Negative for chest pain and palpitations.       Trace edema BLE  Gastrointestinal: Positive for abdominal pain. Negative for abdominal distention, anal bleeding, blood in stool, constipation, diarrhea, nausea and vomiting.       Stomach aches in am. Blood seen in toilet with BM  Endocrine: Negative for cold intolerance, heat intolerance, polydipsia, polyphagia and polyuria.  Genitourinary: Positive for frequency. Negative for difficulty urinating, dysuria, flank pain, hematuria, pelvic pain, urgency and vaginal discharge.  Musculoskeletal: Negative for arthralgias, back pain, gait problem, myalgias, neck pain and neck stiffness.       Right hip pain-ambulates with walker. Fx Dec 2015.  Skin: Negative for color change, pallor and rash.       Chronic venous dermatitis    Allergic/Immunologic: Negative.  Negative for environmental allergies.  Neurological: Negative for dizziness, tremors, seizures, syncope, weakness, numbness and headaches.       Memory deficit  Hematological: Negative for adenopathy. Does not bruise/bleed easily.  Psychiatric/Behavioral: Negative for agitation, behavioral problems, confusion, dysphoric mood, hallucinations, sleep disturbance and suicidal ideas. The patient is nervous/anxious. The patient is not hyperactive.     Immunization History  Administered Date(s) Administered  . Influenza Whole 01/16/2010  . Influenza-Unspecified 12/22/2014  . Pneumococcal Polysaccharide-23 01/21/2006  . Pneumococcal-Unspecified 12/19/1998  . Td 03/18/1998   Pertinent  Health Maintenance Due  Topic Date Due  . PNA vac Low Risk Adult (2 of 2 - PCV13) 01/22/2007  . INFLUENZA VACCINE  10/17/2015  . DEXA SCAN  Completed   Fall Risk  06/20/2015  Falls in the past year? No  Risk for fall due to : Impaired balance/gait   Functional Status Survey:  Vitals:   11/23/15 0954  BP: 140/79  Pulse: 95  Resp: 20  Temp: 99.2 F (37.3 C)  Weight: 126 lb 8 oz (57.4 kg)  Height: 5\' 1"  (1.549 m)   Body mass index is 23.9 kg/m. Physical Exam  Constitutional: She appears well-developed and well-nourished. No distress.  HENT:  Head: Normocephalic and atraumatic.  Right Ear: External ear normal.  Left Ear: External ear normal.  Nose: Nose normal.  Mouth/Throat: Oropharynx is clear and moist. No oropharyngeal exudate.  Eyes: Conjunctivae and EOM are normal. Pupils are equal, round, and reactive to light. Right eye exhibits no discharge. Left eye exhibits no discharge. No scleral icterus.  Neck: Normal range of motion. Neck supple. No JVD present. No tracheal deviation present. No thyromegaly present.  Cardiovascular: Normal rate, regular rhythm and intact distal pulses.  Exam reveals no gallop and no friction rub.   Murmur heard.  Systolic murmur  is present with a grade of 2/6  Pulmonary/Chest: Effort normal and breath sounds normal. No stridor. No respiratory distress. She has no wheezes. She has no rales. She exhibits no tenderness.  Abdominal: Soft. She exhibits no distension and no mass. There is no tenderness. There is no rebound and no guarding.  Stomach aches in am. Blood seen in toilet with BM, internal hemorrhoids-no blood seen on digital rectal check. UA pending  Genitourinary: Vagina normal.  Musculoskeletal: Normal range of motion. She exhibits edema. She exhibits no tenderness.  BLE trace edema. Pain in the R hip with ROM and weight bearing. Using walker.  Kyphosis.   Lymphadenopathy:    She has no cervical adenopathy.  Neurological: She is alert. She has normal reflexes. No cranial nerve deficit. She exhibits normal muscle tone. Coordination normal.  09/05/14 MMSE 20/30  Skin: Skin is warm and dry. No rash noted. She is not diaphoretic. No erythema. No pallor.  Chronic venous dermatitis BLE  Psychiatric: She has a normal mood and affect. Her behavior is normal. Judgment and thought content normal.    Labs reviewed:  Recent Labs  11/17/15  NA 141  K 4.0  BUN 17  CREATININE 0.8    Recent Labs  11/17/15  AST 17  ALT 11  ALKPHOS 71    Recent Labs  11/17/15  WBC 9.3  HGB 14.0  HCT 42  PLT 293   Lab Results  Component Value Date   TSH 2.13 02/28/2014   No results found for: HGBA1C No results found for: CHOL, HDL, LDLCALC, LDLDIRECT, TRIG, CHOLHDL  Significant Diagnostic Results in last 30 days:  No results found.  Assessment/Plan There are no diagnoses linked to this encounter.Constipation Takes Senokot II hs   GERD (gastroesophageal reflux disease) C/o stomach aches in am, Mylanta not effective, increased Omeprazole to 20mg  bid has little effect, may consider GI consult if no better, GI cocktail prn, Carafate 64ml bid   Alzheimer's disease Functioning well in AL   Tremor,  essential Not disabling.   Hereditary and idiopathic peripheral neuropathy chronic  UTI (urinary tract infection) Fully treated, still c/o stomach pain, but no further hematuria.   Depression Hx of depression, stable, continue Celexa 10mg       Edema BNP 103.1     Family/ staff Communication: continue AL for care assistance  Labs/tests ordered:  none

## 2015-11-23 NOTE — Assessment & Plan Note (Signed)
BNP 103.1

## 2015-11-23 NOTE — Assessment & Plan Note (Signed)
Takes Senokot II hs   

## 2015-11-27 ENCOUNTER — Non-Acute Institutional Stay: Payer: Medicare Other | Admitting: Internal Medicine

## 2015-11-27 ENCOUNTER — Encounter: Payer: Self-pay | Admitting: Internal Medicine

## 2015-11-27 DIAGNOSIS — R1013 Epigastric pain: Secondary | ICD-10-CM

## 2015-11-27 DIAGNOSIS — R1010 Upper abdominal pain, unspecified: Secondary | ICD-10-CM | POA: Diagnosis not present

## 2015-11-27 NOTE — Progress Notes (Signed)
Progress Note    Location:    [facility] Room Number: [place] of Service:  ALF [phone]) Provider:  Jeanmarie Hubert, MD  Patient Care Team: Estill Dooms, MD as PCP - General (Internal Medicine) Jettie Booze, MD as Consulting Physician (Cardiology) Marchia Bond, MD as Consulting Physician (Orthopedic Surgery) Kathrynn Ducking, MD as Consulting Physician (Neurology) Brand Males, MD as Consulting Physician (Pulmonary Disease) Man Otho Darner, NP as Nurse Practitioner (Internal Medicine)  Extended Emergency Contact Information Primary Emergency Contact: [facility] Address: [address]          [place], [place] [address] [place] Phone: [phone] Mobile Phone: [phone] Relation: Son Secondary Emergency Contact: [place] Mobile Phone: [phone] Relation: Son  Code Status:  DNR Goals of care: Advanced Directive information Advanced Directives 11/27/2015  Does patient have an advance directive? Yes  Type of Paramedic of [place];Living will;Out of facility DNR (pink MOST or yellow form)  Does patient want to make changes to advanced directive? -  Copy of advanced directive(s) in chart? Yes     Chief Complaint  Patient presents with  . Acute Visit    upper abd pain    HPI:  Pt is a 82 y.o. female seen today for Evaluation of an intermittent but persistent upper abdominal pain. Previously seen Manxie Mast, NP. Patient says meal seemed to make the discomfort worse although she does wake up discomfort. She has had normal CBC and a normal CMP except for glucose of 111 mg percent. She is already on Prilosec 20 mg twice daily. She has prior history of GERD. She denies much acid reflux at this time. She denies a change in bowel habits. On a previous visit, nurse practitioner noted that there was blood in the toilet. Patient denies noting any recent problems  with blood.   Past Medical History:  Diagnosis Date  . Alzheimer's disease 06/22/2015  . Anemia    iron deficient  . Anemia   . Anxiety   . Chronic airway obstruction, not elsewhere classified   . Chronic back pain   . Chronic cystitis   . Cystitis, chronic    Dr Gaynelle Arabian  . Degenerative arthritis   . Depression   . Disturbance of skin sensation   . Disturbance of skin sensation   . Dyslipidemia   . Dyspnea    Dr Chase Caller  . Esophageal reflux   . Essential and other specified forms of tremor 12/11/2012  . Foot drop, bilateral 12/21/2013  . Gait disorder   . GERD (gastroesophageal reflux disease)   . Hemorrhoids   . Hereditary and idiopathic peripheral neuropathy 10/11/2015  . Hernia   . History of cerebrovascular disease    Moderate level small vessel disease  . Hyperlipidemia    mild  . Hypertension   . Osteoarthritis    right knee injection per Dr Trudie Reed  . Osteoporosis   . Other abnormal clinical finding   . Pain in joint, upper arm   . Peripheral vascular disease ([place])   . Rheumatoid arthritis ([place])    Dr Trudie Reed  . Tobacco use disorder   . Tremor    and gait disorder--Dr Jannifer Franklin  . Unspecified vitamin D deficiency    Past Surgical History:  Procedure Laterality Date  . APPENDECTOMY    . BACK SURGERY Bilateral    X2  . Bladder resuspension procedure    . CATARACT EXTRACTION  Bilateral   . FEMUR IM NAIL Right 02/22/2014   Procedure: INTRAMEDULLARY (IM) NAIL FEMORAL;  Surgeon: Johnny Bridge, MD;  Location: [facility];  Service: Orthopedics;  Laterality: Right;  . INGUINAL HERNIA REPAIR Bilateral   . TONSILLECTOMY      Allergies  Allergen Reactions  . Novocain [Procaine]     Passed  out  . Limbrel [Flavocoxid]     Dizziness   . Sertraline Anxiety      Medication List       Accurate as of 11/27/15  9:42 AM. Always use your most recent med list.          acetaminophen 650 MG CR tablet Commonly known as:  TYLENOL Take 650 mg by mouth 2 (two) times  daily.   CALTRATE 600+D 600-400 MG-UNIT tablet Generic drug:  Calcium Carbonate-Vitamin D Take 1 tablet by mouth daily.   CARAFATE PO Take by mouth. 10mg  twice daily   hydrocortisone cream 1 % Apply 1 application topically. Apply to rectal area twice daily   multivitamin tablet Take 1 tablet by mouth daily.   omeprazole 20 MG capsule Commonly known as:  PRILOSEC Take 20 mg by mouth 2 (two) times daily before a meal.   OSTEO BI-FLEX JOINT SHIELD Tabs Take 1 tablet by mouth daily.   sennosides-docusate sodium 8.6-50 MG tablet Commonly known as:  SENOKOT-S Take 2 tablets by mouth daily.   Vitamin D3 10000 units Tabs Take 1 tablet by mouth daily.   zinc oxide 20 % ointment Apply 1 application topically. Apply to perineum and buttocks as needed for redness and iritation       Review of Systems  Constitutional: Negative for activity change, appetite change, chills, diaphoresis, fatigue, fever and unexpected weight change.  HENT: Positive for hearing loss. Negative for congestion, ear discharge, ear pain, nosebleeds, postnasal drip, rhinorrhea, sore throat, tinnitus, trouble swallowing and voice change.   Eyes: Negative for photophobia, pain, discharge, redness, itching and visual disturbance.  Respiratory: Negative for cough, choking, shortness of breath, wheezing and stridor.   Cardiovascular: Positive for leg swelling. Negative for chest pain and palpitations.       Trace edema BLE  Gastrointestinal: Positive for abdominal pain (tender tp palpation in the epigastric area.). Negative for abdominal distention, anal bleeding, blood in stool, constipation, diarrhea, nausea and vomiting.       Stomach aches in am. Blood seen in toilet with BM on 11/16/15.  Endocrine: Negative for cold intolerance, heat intolerance, polydipsia, polyphagia and polyuria.       Hyperglycemia to 111 mg percent on 11/17/2015  Genitourinary: Positive for frequency. Negative for difficulty urinating,  dysuria, flank pain, hematuria, pelvic pain, urgency and vaginal discharge.  Musculoskeletal: Negative for arthralgias, back pain, gait problem, myalgias, neck pain and neck stiffness.       Right hip pain-ambulates with walker. Fx Dec 2015.  Skin: Negative for color change, pallor and rash.       Chronic venous dermatitis  Allergic/Immunologic: Negative.  Negative for environmental allergies.  Neurological: Negative for dizziness, tremors, seizures, syncope, weakness, numbness and headaches.       Memory deficit  Hematological: Negative for adenopathy. Does not bruise/bleed easily.  Psychiatric/Behavioral: Negative for agitation, behavioral problems, confusion, dysphoric mood, hallucinations, sleep disturbance and suicidal ideas. The patient is nervous/anxious. The patient is not hyperactive.     Immunization History  Administered Date(s) Administered  . Influenza Whole 01/16/2010  . Influenza-Unspecified 12/22/2014  . Pneumococcal Polysaccharide-23 01/21/2006  . Pneumococcal-Unspecified 12/19/1998  .  Td 03/18/1998   Pertinent  Health Maintenance Due  Topic Date Due  . PNA vac Low Risk Adult (2 of 2 - PCV13) 01/22/2007  . INFLUENZA VACCINE  10/17/2015  . DEXA SCAN  Completed   Fall Risk  06/20/2015  Falls in the past year? No  Risk for fall due to : Impaired balance/gait   Functional Status Survey:    Vitals:   11/27/15 0937  BP: 137/87  Pulse: 96  Resp: 18  Temp: 99 F (37.2 C)  SpO2: 94%  Weight: 126 lb (57.2 kg)  Height: 5\' 1"  (1.549 m)   Body mass index is 23.81 kg/m. Physical Exam  Constitutional: She appears well-developed and well-nourished. No distress.  HENT:  Head: Normocephalic and atraumatic.  Right Ear: External ear normal.  Left Ear: External ear normal.  Nose: Nose normal.  Mouth/Throat: Oropharynx is clear and moist. No oropharyngeal exudate.  Eyes: Conjunctivae and EOM are normal. Pupils are equal, round, and reactive to light. Right eye exhibits  no discharge. Left eye exhibits no discharge. No scleral icterus.  Neck: Normal range of motion. Neck supple. No JVD present. No tracheal deviation present. No thyromegaly present.  Cardiovascular: Normal rate, regular rhythm and intact distal pulses.  Exam reveals no gallop and no friction rub.   Murmur heard.  Systolic murmur is present with a grade of 2/6  Pulmonary/Chest: Effort normal and breath sounds normal. No stridor. No respiratory distress. She has no wheezes. She has no rales. She exhibits no tenderness.  Abdominal: Soft. She exhibits no distension and no mass. There is tenderness (epigastric area with palpation). There is no rebound and no guarding.  Stomach aches in am. Blood seen in toilet on 11/16/15 with BM, internal hemorrhoids-no blood seen on digital rectal check.  Genitourinary: Vagina normal.  Genitourinary Comments: History of internal hemorrhoids  Musculoskeletal: Normal range of motion. She exhibits edema. She exhibits no tenderness.  BLE trace edema. Pain in the R hip with ROM and weight bearing. Using walker.  Kyphosis.  Lymphadenopathy:    She has no cervical adenopathy.  Neurological: She is alert. She has normal reflexes. No cranial nerve deficit. She exhibits normal muscle tone. Coordination normal.  09/05/14 MMSE 20/30  Skin: Skin is warm and dry. No rash noted. She is not diaphoretic. No erythema. No pallor.  Chronic venous dermatitis BLE  Psychiatric: She has a normal mood and affect. Her behavior is normal. Judgment and thought content normal.    Labs reviewed:  Recent Labs  11/17/15  NA 141  K 4.0  BUN 17  CREATININE 0.8    Recent Labs  11/17/15  AST 17  ALT 11  ALKPHOS 71    Recent Labs  11/17/15  WBC 9.3  HGB 14.0  HCT 42  PLT 293   Lab Results  Component Value Date   TSH 2.13 02/28/2014    Assessment/Plan  1. Abdominal pain, epigastric -Obtain serum amylase and lipase -Schedule portable ultrasound of the gallbladder and  pancreas -Start Levsin 0.125 mg twice daily

## 2015-11-29 DIAGNOSIS — M542 Cervicalgia: Secondary | ICD-10-CM | POA: Diagnosis not present

## 2015-11-29 DIAGNOSIS — M545 Low back pain: Secondary | ICD-10-CM | POA: Diagnosis not present

## 2015-11-29 DIAGNOSIS — M25551 Pain in right hip: Secondary | ICD-10-CM | POA: Diagnosis not present

## 2015-12-05 ENCOUNTER — Encounter: Payer: Self-pay | Admitting: Nurse Practitioner

## 2015-12-07 DIAGNOSIS — Z79899 Other long term (current) drug therapy: Secondary | ICD-10-CM | POA: Diagnosis not present

## 2015-12-07 DIAGNOSIS — R109 Unspecified abdominal pain: Secondary | ICD-10-CM | POA: Diagnosis not present

## 2015-12-26 ENCOUNTER — Non-Acute Institutional Stay: Payer: Medicare Other | Admitting: Internal Medicine

## 2015-12-26 ENCOUNTER — Ambulatory Visit: Payer: Self-pay | Admitting: Internal Medicine

## 2015-12-26 ENCOUNTER — Encounter: Payer: Self-pay | Admitting: Internal Medicine

## 2015-12-26 VITALS — BP 120/78 | HR 100 | Temp 98.6°F | Ht 61.0 in | Wt 123.0 lb

## 2015-12-26 DIAGNOSIS — N390 Urinary tract infection, site not specified: Secondary | ICD-10-CM

## 2015-12-26 DIAGNOSIS — R1013 Epigastric pain: Secondary | ICD-10-CM

## 2015-12-26 DIAGNOSIS — F0280 Dementia in other diseases classified elsewhere without behavioral disturbance: Secondary | ICD-10-CM | POA: Diagnosis not present

## 2015-12-26 DIAGNOSIS — G250 Essential tremor: Secondary | ICD-10-CM

## 2015-12-26 DIAGNOSIS — G308 Other Alzheimer's disease: Secondary | ICD-10-CM

## 2015-12-26 DIAGNOSIS — R319 Hematuria, unspecified: Secondary | ICD-10-CM | POA: Diagnosis not present

## 2015-12-26 NOTE — Progress Notes (Signed)
[place] Room Number: AL 18  Place of Service: Clinic (12)     Allergies  Allergen Reactions  . Novocain [Procaine]     Passed  out  . Limbrel [Flavocoxid]     Dizziness   . Sertraline Anxiety    Chief Complaint  Patient presents with  . Medical Management of Chronic Issues    6 month medication management Alzheimer's, anemia.  . changes    per AL nurse patient refusing therapy services at times, decrease  in ADLS, increase incontinence.    HPI:  Medical checkup due to staff concerns about increasing weakness, increased incontinence, and decrease in ADL.  Patient denies any issues. No new pain, nausea, dyspnea, palpitatiins, etc.  Medications: Patient's Medications  New Prescriptions   No medications on file  Previous Medications   ACETAMINOPHEN (TYLENOL) 650 MG CR TABLET    Take 650 mg by mouth 2 (two) times daily.   CALCIUM CARBONATE-VITAMIN D (CALTRATE 600+D) 600-400 MG-UNIT PER TABLET    Take 1 tablet by mouth daily.   CHOLECALCIFEROL (VITAMIN D3) 10000 UNITS TABS    Take 1 tablet by mouth daily.   HYDROCORTISONE CREAM 1 %    Apply 1 application topically. Apply to rectal area twice daily as needed   HYOSCYAMINE SULFATE SL (LEVSIN/SL) 0.125 MG SUBL    Place under the tongue. Take one twice daily   MISC NATURAL PRODUCTS (OSTEO BI-FLEX JOINT SHIELD) TABS    Take 1 tablet by mouth daily.   MULTIPLE VITAMIN (MULTIVITAMIN) TABLET    Take 1 tablet by mouth daily.   NUTRITIONAL SUPPLEMENTS (RESOURCE 2.0) LIQD    Take by mouth. 72m twice daily   OMEPRAZOLE (PRILOSEC) 20 MG CAPSULE    Take 20 mg by mouth 2 (two) times daily before a meal.    SENNOSIDES-DOCUSATE SODIUM (SENOKOT-S) 8.6-50 MG TABLET    Take 2 tablets by mouth daily.   SUCRALFATE (CARAFATE PO)    Take by mouth. 112mtwice daily   ZINC OXIDE 20 % OINTMENT    Apply 1 application topically. Apply to perineum and buttocks as needed for redness and iritation  Modified Medications   No  medications on file  Discontinued Medications   No medications on file     Review of Systems  Constitutional: Negative for activity change, appetite change, chills, diaphoresis, fatigue, fever and unexpected weight change.  HENT: Positive for hearing loss. Negative for congestion, ear discharge, ear pain, nosebleeds, postnasal drip, rhinorrhea, sore throat, tinnitus, trouble swallowing and voice change.   Eyes: Negative for photophobia, pain, discharge, redness, itching and visual disturbance.  Respiratory: Negative for cough, choking, shortness of breath, wheezing and stridor.   Cardiovascular: Positive for leg swelling. Negative for chest pain and palpitations.       Trace edema BLE  Gastrointestinal: Negative for abdominal distention, abdominal pain, anal bleeding, blood in stool, constipation, diarrhea, nausea and vomiting.       Stomach aches in am. Blood seen in toilet with BM on 11/16/15.  Endocrine: Negative for cold intolerance, heat intolerance, polydipsia, polyphagia and polyuria.       Hyperglycemia to 111 mg percent on 11/17/2015  Genitourinary: Positive for frequency. Negative for difficulty urinating, dysuria, flank pain, hematuria, pelvic pain, urgency and vaginal discharge.       Incontinence  Musculoskeletal: Negative for arthralgias, back pain, gait problem, myalgias, neck pain and neck stiffness.       Right hip pain-ambulates with walker. Fx Dec  2015.  Skin: Negative for color change, pallor and rash.       Chronic venous dermatitis  Allergic/Immunologic: Negative.  Negative for environmental allergies.  Neurological: Negative for dizziness, tremors, seizures, syncope, weakness, numbness and headaches.       Memory deficit  Hematological: Negative for adenopathy. Does not bruise/bleed easily.  Psychiatric/Behavioral: Positive for confusion and decreased concentration. Negative for agitation, behavioral problems, dysphoric mood, hallucinations, sleep disturbance and  suicidal ideas. The patient is nervous/anxious. The patient is not hyperactive.     Vitals:   12/26/15 1022  BP: 120/78  Pulse: 100  Temp: 98.6 F (37 C)  TempSrc: Oral  SpO2: 98%  Weight: 123 lb (55.8 kg)  Height: 5' 1" (1.549 m)   Wt Readings from Last 3 Encounters:  12/26/15 123 lb (55.8 kg)  11/27/15 126 lb (57.2 kg)  11/23/15 126 lb 8 oz (57.4 kg)    Body mass index is 23.24 kg/m.  Physical Exam  Constitutional: She appears well-developed and well-nourished. No distress.  HENT:  Head: Normocephalic and atraumatic.  Right Ear: External ear normal.  Left Ear: External ear normal.  Nose: Nose normal.  Mouth/Throat: Oropharynx is clear and moist. No oropharyngeal exudate.  Eyes: Conjunctivae and EOM are normal. Pupils are equal, round, and reactive to light. Right eye exhibits no discharge. Left eye exhibits no discharge. No scleral icterus.  Neck: Normal range of motion. Neck supple. No JVD present. No tracheal deviation present. No thyromegaly present.  Cardiovascular: Normal rate, regular rhythm and intact distal pulses.  Exam reveals no gallop and no friction rub.   Murmur heard.  Systolic murmur is present with a grade of 2/6  Pulmonary/Chest: Effort normal and breath sounds normal. No stridor. No respiratory distress. She has no wheezes. She has no rales. She exhibits no tenderness.  Abdominal: Soft. She exhibits no distension and no mass. There is tenderness (epigastric area with palpation). There is no rebound and no guarding.  Stomach aches in am. Blood seen in toilet on 11/16/15 with BM, internal hemorrhoids-no blood seen on digital rectal check.  Genitourinary: Vagina normal.  Genitourinary Comments: History of internal hemorrhoids  Musculoskeletal: Normal range of motion. She exhibits edema. She exhibits no tenderness.  BLE trace edema. Pain in the R hip with ROM and weight bearing. Using walker.  Kyphosis.  Lymphadenopathy:    She has no cervical adenopathy.   Neurological: She is alert. She has normal reflexes. No cranial nerve deficit. She exhibits normal muscle tone. Coordination normal.  09/05/14 MMSE 20/30  Skin: Skin is warm and dry. No rash noted. She is not diaphoretic. No erythema. No pallor.  Chronic venous dermatitis BLE  Psychiatric: She has a normal mood and affect. Her behavior is normal. Judgment and thought content normal.     Labs reviewed: Lab Summary Latest Ref Rng & Units 11/17/2015 03/03/2014 02/28/2014 02/24/2014 02/23/2014 02/22/2014  Hemoglobin 12.0 - 16.0 g/dL 14.0 12.1 11.8(A) 10.6(L) 8.6(L) 11.1(L)  Hematocrit 36 - 46 % 42 36 36 31.2(L) 26.2(L) 33.9(L)  White count 10:3/mL 9.3 9.5 9.6 6.1 9.2 10.3  Platelet count 150 - 399 K/L 293 436(A) 368 134(L) 179 195  Sodium 137 - 147 mmol/L 141 (None) 140 140 144 144  Potassium 3.4 - 5.3 mmol/L 4.0 (None) 4.4 3.5(L) 4.0 4.1  Calcium 8.4 - 10.5 mg/dL (None) (None) (None) 9.1 8.8 9.3  Phosphorus - (None) (None) (None) (None) (None) (None)  Creatinine 0.5 - 1.1 mg/dL 0.8 (None) 0.7 0.62 0.79 0.85  AST  13 - 35 U/L 17 (None) 38(A) (None) (None) (None)  Alk Phos 25 - 125 U/L 71 (None) 44 (None) (None) (None)  Bilirubin - (None) (None) (None) (None) (None) (None)  Glucose mg/dL 111 (None) (None) 107(H) 128(H) 182(H)  Cholesterol - (None) (None) (None) (None) (None) (None)  HDL cholesterol - (None) (None) (None) (None) (None) (None)  Triglycerides - (None) (None) (None) (None) (None) (None)  LDL Direct - (None) (None) (None) (None) (None) (None)  LDL Calc - (None) (None) (None) (None) (None) (None)  Total protein - (None) (None) (None) (None) (None) (None)  Albumin - (None) (None) (None) (None) (None) (None)  Some recent data might be hidden   Lab Results  Component Value Date   TSH 2.13 02/28/2014   Lab Results  Component Value Date   BUN 17 11/17/2015   BUN 18 02/28/2014   BUN 16 02/24/2014   Lab Results  Component Value Date   CREATININE 0.8 11/17/2015   CREATININE  0.7 02/28/2014   CREATININE 0.62 02/24/2014   No results found for: HGBA1C     Assessment/Plan  1. Alzheimer's disease of other onset without behavioral disturbance Slowly progressing. Decline in ADL. -CBC, CMP, TSH  2. Abdominal pain, epigastric improved -DC Carafate  3. Benign essential tremor Unchanged  4. UTI  In Sept 2017 -UA and culture

## 2015-12-28 DIAGNOSIS — E039 Hypothyroidism, unspecified: Secondary | ICD-10-CM | POA: Diagnosis not present

## 2016-01-05 ENCOUNTER — Encounter: Payer: Self-pay | Admitting: Nurse Practitioner

## 2016-01-05 ENCOUNTER — Non-Acute Institutional Stay: Payer: Medicare Other | Admitting: Nurse Practitioner

## 2016-01-05 DIAGNOSIS — F0280 Dementia in other diseases classified elsewhere without behavioral disturbance: Secondary | ICD-10-CM | POA: Diagnosis not present

## 2016-01-05 DIAGNOSIS — G308 Other Alzheimer's disease: Secondary | ICD-10-CM | POA: Diagnosis not present

## 2016-01-05 DIAGNOSIS — G250 Essential tremor: Secondary | ICD-10-CM

## 2016-01-05 DIAGNOSIS — K5900 Constipation, unspecified: Secondary | ICD-10-CM

## 2016-01-05 DIAGNOSIS — R609 Edema, unspecified: Secondary | ICD-10-CM

## 2016-01-05 DIAGNOSIS — K219 Gastro-esophageal reflux disease without esophagitis: Secondary | ICD-10-CM

## 2016-01-05 DIAGNOSIS — F320 Major depressive disorder, single episode, mild: Secondary | ICD-10-CM

## 2016-01-05 NOTE — Assessment & Plan Note (Signed)
Improved, continue Omeprazole and Levsin 

## 2016-01-05 NOTE — Assessment & Plan Note (Signed)
Takes Senokot II hs   

## 2016-01-05 NOTE — Assessment & Plan Note (Signed)
Hx of depression, stable, off Celexa 10mg  

## 2016-01-05 NOTE — Assessment & Plan Note (Signed)
Continue AL for care assistance, start Namenda starter pk, then 10mg  bid. May add Aricept once Namenda is well tolerated.

## 2016-01-05 NOTE — Assessment & Plan Note (Signed)
Not disabling.   

## 2016-01-05 NOTE — Progress Notes (Signed)
Location:  [facility] Room Number: 18 Place of Service:  ALF [phone]) Provider:  Mast, Manxie NP    Jeanmarie Hubert, MD  Patient Care Team: Estill Dooms, MD as PCP - General (Internal Medicine) Jettie Booze, MD as Consulting Physician (Cardiology) Marchia Bond, MD as Consulting Physician (Orthopedic Surgery) Kathrynn Ducking, MD as Consulting Physician (Neurology) Brand Males, MD as Consulting Physician (Pulmonary Disease) Man Otho Darner, NP as Nurse Practitioner (Internal Medicine)  Extended Emergency Contact Information Primary Emergency Contact: [facility] Address: [address]          [place], [place] [address] [place] Phone: [phone] Mobile Phone: [phone] Relation: Son Secondary Emergency Contact: [place] Mobile Phone: [phone] Relation: Son  Code Status: DNR Goals of care: Advanced Directive information Advanced Directives 01/05/2016  Does patient have an advance directive? -  Type of Paramedic of [place];Living will;Out of facility DNR (pink MOST or yellow form)  Does patient want to make changes to advanced directive? No - Patient declined  Copy of advanced directive(s) in chart? Yes  Pre-existing out of facility DNR order (yellow form or pink MOST form) -     Chief Complaint  Patient presents with  . Acute Visit    HPI:  Pt is a 82 y.o. female seen today for an acute visit for cognitive decline, MMSE15/30 10/27/15, no on memory preserving meds.    Hx of depression/anxiety stable, off Celexa 10mg , GERD stable on Omeprazole, Levsin 0.125mg  bid,  constipation is managed with Senokot II tabs daily. Chronic BLE edema, last BNP 11/18/15 103.1    Past Medical History:  Diagnosis Date  . Alzheimer's disease 06/22/2015  . Anemia    iron deficient  . Anemia   . Anxiety   . Chronic airway obstruction, not elsewhere classified   .  Chronic back pain   . Chronic cystitis   . Cystitis, chronic    Dr Gaynelle Arabian  . Degenerative arthritis   . Depression   . Disturbance of skin sensation   . Disturbance of skin sensation   . Dyslipidemia   . Dyspnea    Dr Chase Caller  . Esophageal reflux   . Essential and other specified forms of tremor 12/11/2012  . Foot drop, bilateral 12/21/2013  . Gait disorder   . GERD (gastroesophageal reflux disease)   . Hemorrhoids   . Hereditary and idiopathic peripheral neuropathy 10/11/2015  . Hernia   . History of cerebrovascular disease    Moderate level small vessel disease  . Hyperlipidemia    mild  . Hypertension   . Osteoarthritis    right knee injection per Dr Trudie Reed  . Osteoporosis   . Other abnormal clinical finding   . Pain in joint, upper arm   . Peripheral vascular disease ([place])   . Rheumatoid arthritis ([place])    Dr Trudie Reed  . Tobacco use disorder   . Tremor    and gait disorder--Dr Jannifer Franklin  . Unspecified vitamin D deficiency    Past Surgical History:  Procedure Laterality Date  . APPENDECTOMY    . BACK SURGERY Bilateral    X2  . Bladder resuspension procedure    . CATARACT EXTRACTION Bilateral   . FEMUR IM NAIL Right 02/22/2014   Procedure: INTRAMEDULLARY (IM) NAIL FEMORAL;  Surgeon: Johnny Bridge, MD;  Location: [facility];  Service: Orthopedics;  Laterality: Right;  . INGUINAL HERNIA REPAIR Bilateral   . TONSILLECTOMY  Allergies  Allergen Reactions  . Novocain [Procaine]     Passed  out  . Limbrel [Flavocoxid]     Dizziness   . Sertraline Anxiety      Medication List       Accurate as of 01/05/16  2:36 PM. Always use your most recent med list.          acetaminophen 650 MG CR tablet Commonly known as:  TYLENOL Take 650 mg by mouth 2 (two) times daily.   CALTRATE 600+D 600-400 MG-UNIT tablet Generic drug:  Calcium Carbonate-Vitamin D Take 1 tablet by mouth daily.   hydrocortisone cream 1 % Apply 1 application topically. Apply to rectal  area twice daily as needed   LEVSIN/SL 0.125 MG Subl Generic drug:  Hyoscyamine Sulfate SL Place under the tongue. Take one twice daily   multivitamin tablet Take 1 tablet by mouth daily.   omeprazole 20 MG capsule Commonly known as:  PRILOSEC Take 20 mg by mouth 2 (two) times daily before a meal.   OSTEO BI-FLEX JOINT SHIELD Tabs Take 1 tablet by mouth daily.   RESOURCE 2.0 Liqd Take by mouth. 2ml twice daily   sennosides-docusate sodium 8.6-50 MG tablet Commonly known as:  SENOKOT-S Take 2 tablets by mouth daily.   Vitamin D3 10000 units Tabs Take 1 tablet by mouth daily.   zinc oxide 20 % ointment Apply 1 application topically. Apply to perineum and buttocks as needed for redness and iritation       Review of Systems  Constitutional: Negative for activity change, appetite change, chills, diaphoresis, fatigue, fever and unexpected weight change.  HENT: Positive for hearing loss. Negative for congestion, ear discharge, ear pain, nosebleeds, postnasal drip, rhinorrhea, sore throat, tinnitus, trouble swallowing and voice change.   Eyes: Negative for photophobia, pain, discharge, redness, itching and visual disturbance.  Respiratory: Negative for cough, choking, shortness of breath, wheezing and stridor.   Cardiovascular: Positive for leg swelling. Negative for chest pain and palpitations.       Trace edema BLE  Gastrointestinal: Negative for abdominal distention, abdominal pain, anal bleeding, blood in stool, constipation, diarrhea, nausea and vomiting.       Stomach aches in am. Blood seen in toilet with BM on 11/16/15.  Endocrine: Negative for cold intolerance, heat intolerance, polydipsia, polyphagia and polyuria.       Hyperglycemia to 111 mg percent on 11/17/2015  Genitourinary: Positive for frequency. Negative for difficulty urinating, dysuria, flank pain, hematuria, pelvic pain, urgency and vaginal discharge.       Incontinence  Musculoskeletal: Negative for  arthralgias, back pain, gait problem, myalgias, neck pain and neck stiffness.       Right hip pain-ambulates with walker. Fx Dec 2015.  Skin: Negative for color change, pallor and rash.       Chronic venous dermatitis  Allergic/Immunologic: Negative.  Negative for environmental allergies.  Neurological: Negative for dizziness, tremors, seizures, syncope, weakness, numbness and headaches.       Memory deficit  Hematological: Negative for adenopathy. Does not bruise/bleed easily.  Psychiatric/Behavioral: Positive for confusion and decreased concentration. Negative for agitation, behavioral problems, dysphoric mood, hallucinations, sleep disturbance and suicidal ideas. The patient is nervous/anxious. The patient is not hyperactive.     Immunization History  Administered Date(s) Administered  . Influenza Whole 01/16/2010  . Influenza-Unspecified 12/22/2014, 12/28/2015  . Pneumococcal Polysaccharide-23 01/21/2006  . Pneumococcal-Unspecified 12/19/1998  . Td 03/18/1998   Pertinent  Health Maintenance Due  Topic Date Due  . PNA vac Low  Risk Adult (2 of 2 - PCV13) 01/22/2007  . INFLUENZA VACCINE  Completed  . DEXA SCAN  Completed   Fall Risk  06/20/2015  Falls in the past year? No  Risk for fall due to : Impaired balance/gait   Functional Status Survey:    Vitals:   01/05/16 1225  BP: 125/72  Pulse: 61  Resp: (!) 22  Temp: 97.5 F (36.4 C)  Weight: 123 lb (55.8 kg)  Height: 5\' 1"  (1.549 m)   Body mass index is 23.24 kg/m. Physical Exam  Constitutional: She appears well-developed and well-nourished. No distress.  HENT:  Head: Normocephalic and atraumatic.  Right Ear: External ear normal.  Left Ear: External ear normal.  Nose: Nose normal.  Mouth/Throat: Oropharynx is clear and moist. No oropharyngeal exudate.  Eyes: Conjunctivae and EOM are normal. Pupils are equal, round, and reactive to light. Right eye exhibits no discharge. Left eye exhibits no discharge. No scleral  icterus.  Neck: Normal range of motion. Neck supple. No JVD present. No tracheal deviation present. No thyromegaly present.  Cardiovascular: Normal rate, regular rhythm and intact distal pulses.  Exam reveals no gallop and no friction rub.   Murmur heard.  Systolic murmur is present with a grade of 2/6  Pulmonary/Chest: Effort normal and breath sounds normal. No stridor. No respiratory distress. She has no wheezes. She has no rales. She exhibits no tenderness.  Abdominal: Soft. She exhibits no distension and no mass. There is tenderness (epigastric area with palpation). There is no rebound and no guarding.  Stomach aches in am. Blood seen in toilet on 11/16/15 with BM, internal hemorrhoids-no blood seen on digital rectal check.  Genitourinary: Vagina normal.  Genitourinary Comments: History of internal hemorrhoids  Musculoskeletal: Normal range of motion. She exhibits edema. She exhibits no tenderness.  BLE trace edema. Pain in the R hip with ROM and weight bearing. Using walker.  Kyphosis.  Lymphadenopathy:    She has no cervical adenopathy.  Neurological: She is alert. She has normal reflexes. No cranial nerve deficit. She exhibits normal muscle tone. Coordination normal.  09/05/14 MMSE 20/30  Skin: Skin is warm and dry. No rash noted. She is not diaphoretic. No erythema. No pallor.  Chronic venous dermatitis BLE  Psychiatric: She has a normal mood and affect. Her behavior is normal. Judgment and thought content normal.    Labs reviewed:  Recent Labs  11/17/15  NA 141  K 4.0  BUN 17  CREATININE 0.8    Recent Labs  11/17/15  AST 17  ALT 11  ALKPHOS 71    Recent Labs  11/17/15  WBC 9.3  HGB 14.0  HCT 42  PLT 293   Lab Results  Component Value Date   TSH 2.13 02/28/2014   No results found for: HGBA1C No results found for: CHOL, HDL, LDLCALC, LDLDIRECT, TRIG, CHOLHDL  Significant Diagnostic Results in last 30 days:  No results found.  Assessment/Plan There are no  diagnoses linked to this encounter.   Family/ staff Communication:   Labs/tests ordered:

## 2016-01-05 NOTE — Assessment & Plan Note (Signed)
BNP 103.1, trace BLE

## 2016-01-26 ENCOUNTER — Encounter: Payer: Self-pay | Admitting: Nurse Practitioner

## 2016-01-26 ENCOUNTER — Non-Acute Institutional Stay: Payer: Medicare Other | Admitting: Nurse Practitioner

## 2016-01-26 DIAGNOSIS — F320 Major depressive disorder, single episode, mild: Secondary | ICD-10-CM | POA: Diagnosis not present

## 2016-01-26 DIAGNOSIS — K219 Gastro-esophageal reflux disease without esophagitis: Secondary | ICD-10-CM | POA: Diagnosis not present

## 2016-01-26 DIAGNOSIS — G308 Other Alzheimer's disease: Secondary | ICD-10-CM | POA: Diagnosis not present

## 2016-01-26 DIAGNOSIS — K5900 Constipation, unspecified: Secondary | ICD-10-CM | POA: Diagnosis not present

## 2016-01-26 DIAGNOSIS — G250 Essential tremor: Secondary | ICD-10-CM

## 2016-01-26 DIAGNOSIS — F0280 Dementia in other diseases classified elsewhere without behavioral disturbance: Secondary | ICD-10-CM | POA: Diagnosis not present

## 2016-01-26 DIAGNOSIS — R609 Edema, unspecified: Secondary | ICD-10-CM | POA: Diagnosis not present

## 2016-01-26 NOTE — Assessment & Plan Note (Signed)
Improved, continue Omeprazole and Levsin 

## 2016-01-26 NOTE — Assessment & Plan Note (Signed)
Hx of depression, stable, off Celexa 10mg  

## 2016-01-26 NOTE — Assessment & Plan Note (Signed)
Takes Senokot II hs   

## 2016-01-26 NOTE — Assessment & Plan Note (Signed)
Not disabling.   

## 2016-01-26 NOTE — Assessment & Plan Note (Signed)
Continue AL for care assistance, start Namenda starter pk, then 10mg  bid. May add Aricept once Namenda is well tolerated.

## 2016-01-26 NOTE — Assessment & Plan Note (Signed)
BNP 103.1, trace edema BLE

## 2016-01-26 NOTE — Progress Notes (Signed)
Location:  [facility] Room Number: 18 Place of Service:  ALF [phone]) Provider:  Mia [place], Manxie  NP  Jeanmarie Hubert, MD  Patient Care Team: Estill Dooms, MD as PCP - General (Internal Medicine) Jettie Booze, MD as Consulting Physician (Cardiology) Marchia Bond, MD as Consulting Physician (Orthopedic Surgery) Kathrynn Ducking, MD as Consulting Physician (Neurology) Brand Males, MD as Consulting Physician (Pulmonary Disease) Josias Tomerlin Otho Darner, NP as Nurse Practitioner (Internal Medicine)  Extended Emergency Contact Information Primary Emergency Contact: [facility] Address: [address]          [place], [place] [address] [place] Phone: [phone] Mobile Phone: [phone] Relation: Son Secondary Emergency Contact: [place] of [place] Mobile Phone: [phone] Relation: Son  Code Status: DNR Goals of care: Advanced Directive information Advanced Directives 01/26/2016  Does patient have an advance directive? Yes  Type of Paramedic of [place];Living will;Out of facility DNR (pink MOST or yellow form)  Does patient want to make changes to advanced directive? No - Patient declined  Copy of advanced directive(s) in chart? Yes  Pre-existing out of facility DNR order (yellow form or pink MOST form) -     Chief Complaint  Patient presents with  . Medical Management of Chronic Issues    HPI:  Pt is a 82 y.o. female seen today for medical management of chronic diseases.    MMSE15/30 10/27/15, newly started Namenda              Hx of depression/anxiety stable, off Celexa 10mg , GERD stable on Omeprazole, Levsin 0.125mg  bid,  constipation is managed with Senokot II tabs daily. Chronic BLE edema, last BNP 11/18/15 103.1  Past Medical History:  Diagnosis Date  . Alzheimer's disease 06/22/2015  . Anemia    iron deficient  . Anemia   . Anxiety   . Chronic airway obstruction, not  elsewhere classified   . Chronic back pain   . Chronic cystitis   . Cystitis, chronic    Dr Gaynelle Arabian  . Degenerative arthritis   . Depression   . Disturbance of skin sensation   . Disturbance of skin sensation   . Dyslipidemia   . Dyspnea    Dr Chase Caller  . Esophageal reflux   . Essential and other specified forms of tremor 12/11/2012  . Foot drop, bilateral 12/21/2013  . Gait disorder   . GERD (gastroesophageal reflux disease)   . Hemorrhoids   . Hereditary and idiopathic peripheral neuropathy 10/11/2015  . Hernia   . History of cerebrovascular disease    Moderate level small vessel disease  . Hyperlipidemia    mild  . Hypertension   . Osteoarthritis    right knee injection per Dr Trudie Reed  . Osteoporosis   . Other abnormal clinical finding   . Pain in joint, upper arm   . Peripheral vascular disease ([place])   . Rheumatoid arthritis ([place])    Dr Trudie Reed  . Tobacco use disorder   . Tremor    and gait disorder--Dr Jannifer Franklin  . Unspecified vitamin D deficiency    Past Surgical History:  Procedure Laterality Date  . APPENDECTOMY    . BACK SURGERY Bilateral    X2  . Bladder resuspension procedure    . CATARACT EXTRACTION Bilateral   . FEMUR IM NAIL Right 02/22/2014   Procedure: INTRAMEDULLARY (IM) NAIL FEMORAL;  Surgeon: Johnny Bridge, MD;  Location: [facility];  Service: Orthopedics;  Laterality: Right;  .  INGUINAL HERNIA REPAIR Bilateral   . TONSILLECTOMY      Allergies  Allergen Reactions  . Novocain [Procaine]     Passed  out  . Limbrel [Flavocoxid]     Dizziness   . Sertraline Anxiety      Medication List       Accurate as of 01/26/16  2:17 PM. Always use your most recent med list.          acetaminophen 650 MG CR tablet Commonly known as:  TYLENOL Take 650 mg by mouth 2 (two) times daily.   CALTRATE 600+D 600-400 MG-UNIT tablet Generic drug:  Calcium Carbonate-Vitamin D Take 1 tablet by mouth daily.   hydrocortisone cream 1 % Apply 1 application  topically. Apply to rectal area twice daily as needed   LEVSIN/SL 0.125 MG Subl Generic drug:  Hyoscyamine Sulfate SL Place under the tongue. Take one twice daily   multivitamin tablet Take 1 tablet by mouth daily.   omeprazole 20 MG capsule Commonly known as:  PRILOSEC Take 20 mg by mouth 2 (two) times daily before a meal.   OSTEO BI-FLEX JOINT SHIELD Tabs Take 1 tablet by mouth daily.   RESOURCE 2.0 Liqd Take by mouth. 19ml twice daily   sennosides-docusate sodium 8.6-50 MG tablet Commonly known as:  SENOKOT-S Take 2 tablets by mouth daily.   Vitamin D3 10000 units Tabs Take 1 tablet by mouth daily.   zinc oxide 20 % ointment Apply 1 application topically. Apply to perineum and buttocks as needed for redness and iritation       Review of Systems  Constitutional: Negative for activity change, appetite change, chills, diaphoresis, fatigue, fever and unexpected weight change.  HENT: Positive for hearing loss. Negative for congestion, ear discharge, ear pain, nosebleeds, postnasal drip, rhinorrhea, sore throat, tinnitus, trouble swallowing and voice change.   Eyes: Negative for photophobia, pain, discharge, redness, itching and visual disturbance.  Respiratory: Negative for cough, choking, shortness of breath, wheezing and stridor.   Cardiovascular: Positive for leg swelling. Negative for chest pain and palpitations.       Trace edema BLE  Gastrointestinal: Negative for abdominal distention, abdominal pain, anal bleeding, blood in stool, constipation, diarrhea, nausea and vomiting.       Stomach aches in am. Blood seen in toilet with BM on 11/16/15.  Endocrine: Negative for cold intolerance, heat intolerance, polydipsia, polyphagia and polyuria.       Hyperglycemia to 111 mg percent on 11/17/2015  Genitourinary: Positive for frequency. Negative for difficulty urinating, dysuria, flank pain, hematuria, pelvic pain, urgency and vaginal discharge.       Incontinence    Musculoskeletal: Negative for arthralgias, back pain, gait problem, myalgias, neck pain and neck stiffness.       Right hip pain-ambulates with walker. Fx Dec 2015.  Skin: Negative for color change, pallor and rash.       Chronic venous dermatitis  Allergic/Immunologic: Negative.  Negative for environmental allergies.  Neurological: Negative for dizziness, tremors, seizures, syncope, weakness, numbness and headaches.       Memory deficit  Hematological: Negative for adenopathy. Does not bruise/bleed easily.  Psychiatric/Behavioral: Positive for confusion and decreased concentration. Negative for agitation, behavioral problems, dysphoric mood, hallucinations, sleep disturbance and suicidal ideas. The patient is nervous/anxious. The patient is not hyperactive.     Immunization History  Administered Date(s) Administered  . Influenza Whole 01/16/2010  . Influenza-Unspecified 12/22/2014, 12/28/2015  . Pneumococcal Polysaccharide-23 01/21/2006  . Pneumococcal-Unspecified 12/19/1998  . Td 03/18/1998  Pertinent  Health Maintenance Due  Topic Date Due  . PNA vac Low Risk Adult (2 of 2 - PCV13) 01/22/2007  . INFLUENZA VACCINE  Completed  . DEXA SCAN  Completed   Fall Risk  06/20/2015  Falls in the past year? No  Risk for fall due to : Impaired balance/gait   Functional Status Survey:    Vitals:   01/26/16 0945  BP: (!) 157/91  Pulse: 78  Resp: (!) 22  Temp: 98.2 F (36.8 C)  Weight: 123 lb (55.8 kg)  Height: 5\' 1"  (1.549 m)   Body mass index is 23.24 kg/m. Physical Exam  Constitutional: She appears well-developed and well-nourished. No distress.  HENT:  Head: Normocephalic and atraumatic.  Right Ear: External ear normal.  Left Ear: External ear normal.  Nose: Nose normal.  Mouth/Throat: Oropharynx is clear and moist. No oropharyngeal exudate.  Eyes: Conjunctivae and EOM are normal. Pupils are equal, round, and reactive to light. Right eye exhibits no discharge. Left eye  exhibits no discharge. No scleral icterus.  Neck: Normal range of motion. Neck supple. No JVD present. No tracheal deviation present. No thyromegaly present.  Cardiovascular: Normal rate, regular rhythm and intact distal pulses.  Exam reveals no gallop and no friction rub.   Murmur heard.  Systolic murmur is present with a grade of 2/6  Pulmonary/Chest: Effort normal and breath sounds normal. No stridor. No respiratory distress. She has no wheezes. She has no rales. She exhibits no tenderness.  Abdominal: Soft. She exhibits no distension and no mass. There is tenderness (epigastric area with palpation). There is no rebound and no guarding.  Stomach aches in am. Blood seen in toilet on 11/16/15 with BM, internal hemorrhoids-no blood seen on digital rectal check.  Genitourinary: Vagina normal.  Genitourinary Comments: History of internal hemorrhoids  Musculoskeletal: Normal range of motion. She exhibits edema. She exhibits no tenderness.  BLE trace edema. Pain in the R hip with ROM and weight bearing. Using walker.  Kyphosis.  Lymphadenopathy:    She has no cervical adenopathy.  Neurological: She is alert. She has normal reflexes. No cranial nerve deficit. She exhibits normal muscle tone. Coordination normal.  09/05/14 MMSE 20/30  Skin: Skin is warm and dry. No rash noted. She is not diaphoretic. No erythema. No pallor.  Chronic venous dermatitis BLE  Psychiatric: She has a normal mood and affect. Her behavior is normal. Judgment and thought content normal.    Labs reviewed:  Recent Labs  11/17/15  NA 141  K 4.0  BUN 17  CREATININE 0.8    Recent Labs  11/17/15  AST 17  ALT 11  ALKPHOS 71    Recent Labs  11/17/15  WBC 9.3  HGB 14.0  HCT 42  PLT 293   Lab Results  Component Value Date   TSH 2.13 02/28/2014   No results found for: HGBA1C No results found for: CHOL, HDL, LDLCALC, LDLDIRECT, TRIG, CHOLHDL  Significant Diagnostic Results in last 30 days:  No results  found.  Assessment/Plan Constipation Takes Senokot II hs    GERD (gastroesophageal reflux disease) Improved, continue Omeprazole and Levsin    Benign essential tremor Not disabling.    Alzheimer's disease Continue AL for care assistance, start Namenda starter pk, then 10mg  bid. May add Aricept once Namenda is well tolerated.    Depression Hx of depression, stable, off Celexa 10mg    Edema BNP 103.1, trace edema BLE      Family/ staff Communication: AL  Labs/tests ordered:  none

## 2016-01-31 ENCOUNTER — Telehealth: Payer: Self-pay | Admitting: Gastroenterology

## 2016-01-31 ENCOUNTER — Ambulatory Visit: Payer: Self-pay | Admitting: Gastroenterology

## 2016-01-31 NOTE — Telephone Encounter (Signed)
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

## 2016-02-27 ENCOUNTER — Encounter: Payer: Self-pay | Admitting: Nurse Practitioner

## 2016-02-27 ENCOUNTER — Non-Acute Institutional Stay: Payer: Medicare Other | Admitting: Nurse Practitioner

## 2016-02-27 DIAGNOSIS — K5900 Constipation, unspecified: Secondary | ICD-10-CM

## 2016-02-27 DIAGNOSIS — G301 Alzheimer's disease with late onset: Secondary | ICD-10-CM | POA: Diagnosis not present

## 2016-02-27 DIAGNOSIS — G609 Hereditary and idiopathic neuropathy, unspecified: Secondary | ICD-10-CM

## 2016-02-27 DIAGNOSIS — F0280 Dementia in other diseases classified elsewhere without behavioral disturbance: Secondary | ICD-10-CM | POA: Diagnosis not present

## 2016-02-27 DIAGNOSIS — G250 Essential tremor: Secondary | ICD-10-CM

## 2016-02-27 DIAGNOSIS — F320 Major depressive disorder, single episode, mild: Secondary | ICD-10-CM | POA: Diagnosis not present

## 2016-02-27 DIAGNOSIS — R1013 Epigastric pain: Secondary | ICD-10-CM

## 2016-02-27 DIAGNOSIS — R609 Edema, unspecified: Secondary | ICD-10-CM | POA: Diagnosis not present

## 2016-02-27 DIAGNOSIS — K219 Gastro-esophageal reflux disease without esophagitis: Secondary | ICD-10-CM

## 2016-02-27 NOTE — Progress Notes (Signed)
Location:  [facility] Room Number: 18 Place of Service:  ALF [phone]) Provider:  Mast, Manxie  NP  Jeanmarie Hubert, MD  Patient Care Team: Estill Dooms, MD as PCP - General (Internal Medicine) Jettie Booze, MD as Consulting Physician (Cardiology) Marchia Bond, MD as Consulting Physician (Orthopedic Surgery) Kathrynn Ducking, MD as Consulting Physician (Neurology) Brand Males, MD as Consulting Physician (Pulmonary Disease) Man Otho Darner, NP as Nurse Practitioner (Internal Medicine)  Extended Emergency Contact Information Primary Emergency Contact: [facility] Address: [address], [place] [address] [place] Phone: [phone] Mobile Phone: [phone] Relation: Son Secondary Emergency Contact: [place] Mobile Phone: [phone] Relation: Son  Code Status:  DNR Goals of care: Advanced Directive information Advanced Directives 02/27/2016  Does Patient Have a Medical Advance Directive? Yes  Type of Paramedic of [place];Living will;Out of facility DNR (pink MOST or yellow form)  Does patient want to make changes to medical advance directive? No - Patient declined  Copy of [place] in Chart? Yes  Pre-existing out of facility DNR order (yellow form or pink MOST form) -     Chief Complaint  Patient presents with  . Medical Management of Chronic Issues    HPI:  Pt is a 82 y.o. female seen today for medical management of chronic diseases.    MMSE15/30 10/27/15, newly started Namenda, tolerated.   Hx of depression/anxiety stable, off Celexa 10mg , GERD stable on Omeprazole, Levsin 0.125mg  bid, able to change to prn, constipation is managed with Senokot II tabs daily. Chronic BLE edema, last BNP 11/18/15 103.1   Past Medical History:  Diagnosis Date  . Alzheimer's disease 06/22/2015  . Anemia    iron deficient    . Anemia   . Anxiety   . Chronic airway obstruction, not elsewhere classified   . Chronic back pain   . Chronic cystitis   . Cystitis, chronic    Dr Gaynelle Arabian  . Degenerative arthritis   . Depression   . Disturbance of skin sensation   . Disturbance of skin sensation   . Dyslipidemia   . Dyspnea    Dr Chase Caller  . Esophageal reflux   . Essential and other specified forms of tremor 12/11/2012  . Foot drop, bilateral 12/21/2013  . Gait disorder   . GERD (gastroesophageal reflux disease)   . Hemorrhoids   . Hereditary and idiopathic peripheral neuropathy 10/11/2015  . Hernia   . History of cerebrovascular disease    Moderate level small vessel disease  . Hyperlipidemia    mild  . Hypertension   . Osteoarthritis    right knee injection per Dr Trudie Reed  . Osteoporosis   . Other abnormal clinical finding   . Pain in joint, upper arm   . Peripheral vascular disease ([place])   . Rheumatoid arthritis ([place])    Dr Trudie Reed  . Tobacco use disorder   . Tremor    and gait disorder--Dr Jannifer Franklin  . Unspecified vitamin D deficiency    Past Surgical History:  Procedure Laterality Date  . APPENDECTOMY    . BACK SURGERY Bilateral    X2  . Bladder resuspension procedure    . CATARACT EXTRACTION Bilateral   . FEMUR IM NAIL Right 02/22/2014   Procedure: INTRAMEDULLARY (IM) NAIL FEMORAL;  Surgeon: Johnny Bridge, MD;  Location: [facility];  Service: Orthopedics;  Laterality:  Right;  [place] INGUINAL HERNIA REPAIR Bilateral   . TONSILLECTOMY      Allergies  Allergen Reactions  . Novocain [Procaine]     Passed  out  . Limbrel [Flavocoxid]     Dizziness   . Sertraline Anxiety      Medication List       Accurate as of 02/27/16  2:23 PM. Always use your most recent med list.          acetaminophen 650 MG CR tablet Commonly known as:  TYLENOL Take 650 mg by mouth 2 (two) times daily.   CALTRATE 600+D 600-400 MG-UNIT tablet Generic drug:  Calcium Carbonate-Vitamin D Take 1 tablet by mouth  daily.   hydrocortisone cream 1 % Apply 1 application topically. Apply to rectal area twice daily as needed   LEVSIN/SL 0.125 MG Subl Generic drug:  Hyoscyamine Sulfate SL Place under the tongue. Take one twice daily   memantine 10 MG tablet Commonly known as:  NAMENDA Take 10 mg by mouth 2 (two) times daily.   multivitamin tablet Take 1 tablet by mouth daily.   omeprazole 20 MG capsule Commonly known as:  PRILOSEC Take 20 mg by mouth 2 (two) times daily before a meal.   OSTEO BI-FLEX JOINT SHIELD Tabs Take 1 tablet by mouth daily.   RESOURCE 2.0 Liqd Take by mouth. 4ml twice daily   sennosides-docusate sodium 8.6-50 MG tablet Commonly known as:  SENOKOT-S Take 2 tablets by mouth daily.   Vitamin D3 10000 units Tabs Take 1 tablet by mouth daily.   zinc oxide 20 % ointment Apply 1 application topically. Apply to perineum and buttocks as needed for redness and iritation       Review of Systems  Constitutional: Negative for activity change, appetite change, chills, diaphoresis, fatigue, fever and unexpected weight change.  HENT: Positive for hearing loss. Negative for congestion, ear discharge, ear pain, nosebleeds, postnasal drip, rhinorrhea, sore throat, tinnitus, trouble swallowing and voice change.   Eyes: Negative for photophobia, pain, discharge, redness, itching and visual disturbance.  Respiratory: Negative for cough, choking, shortness of breath, wheezing and stridor.   Cardiovascular: Positive for leg swelling. Negative for chest pain and palpitations.       Trace edema BLE  Gastrointestinal: Negative for abdominal distention, abdominal pain, anal bleeding, blood in stool, constipation, diarrhea, nausea and vomiting.       Stomach aches in am. Blood seen in toilet with BM on 11/16/15.  Endocrine: Negative for cold intolerance, heat intolerance, polydipsia, polyphagia and polyuria.       Hyperglycemia to 111 mg percent on 11/17/2015  Genitourinary: Positive for  frequency. Negative for difficulty urinating, dysuria, flank pain, hematuria, pelvic pain, urgency and vaginal discharge.       Incontinence  Musculoskeletal: Negative for arthralgias, back pain, gait problem, myalgias, neck pain and neck stiffness.       Right hip pain-ambulates with walker. Fx Dec 2015.  Skin: Negative for color change, pallor and rash.       Chronic venous dermatitis  Allergic/Immunologic: Positive for food allergies. Negative for environmental allergies.  Neurological: Negative for dizziness, tremors, seizures, syncope, weakness, numbness and headaches.       Memory deficit  Hematological: Negative for adenopathy. Does not bruise/bleed easily.  Psychiatric/Behavioral: Positive for confusion and decreased concentration. Negative for agitation, behavioral problems, dysphoric mood, hallucinations, sleep disturbance and suicidal ideas. The patient is nervous/anxious. The patient is not hyperactive.     Immunization History  Administered Date(s) Administered  .  Influenza Whole 01/16/2010  . Influenza-Unspecified 12/22/2014, 12/28/2015  . Pneumococcal Polysaccharide-23 01/21/2006  . Pneumococcal-Unspecified 12/19/1998  . Td 03/18/1998   Pertinent  Health Maintenance Due  Topic Date Due  . PNA vac Low Risk Adult (2 of 2 - PCV13) 01/22/2007  . INFLUENZA VACCINE  Completed  . DEXA SCAN  Completed   Fall Risk  06/20/2015  Falls in the past year? No  Risk for fall due to : Impaired balance/gait   Functional Status Survey:    Vitals:   02/27/16 1325  BP: 138/85  Pulse: (!) 104  Resp: (!) 24  Temp: 98.9 F (37.2 C)  SpO2: 96%  Weight: 126 lb (57.2 kg)  Height: 5\' 1"  (1.549 m)   Body mass index is 23.81 kg/m. Physical Exam  Constitutional: She appears well-developed and well-nourished. No distress.  HENT:  Head: Normocephalic and atraumatic.  Right Ear: External ear normal.  Left Ear: External ear normal.  Nose: Nose normal.  Mouth/Throat: Oropharynx is  clear and moist. No oropharyngeal exudate.  Eyes: Conjunctivae and EOM are normal. Pupils are equal, round, and reactive to light. Right eye exhibits no discharge. Left eye exhibits no discharge. No scleral icterus.  Neck: Normal range of motion. Neck supple. No JVD present. No tracheal deviation present. No thyromegaly present.  Cardiovascular: Normal rate, regular rhythm and intact distal pulses.  Exam reveals no gallop and no friction rub.   Murmur heard.  Systolic murmur is present with a grade of 2/6  Pulmonary/Chest: Effort normal and breath sounds normal. No stridor. No respiratory distress. She has no wheezes. She has no rales. She exhibits no tenderness.  Abdominal: Soft. She exhibits no distension and no mass. There is tenderness (epigastric area with palpation). There is no rebound and no guarding.  Stomach aches in am. Blood seen in toilet on 11/16/15 with BM, internal hemorrhoids-no blood seen on digital rectal check.  Genitourinary: Vagina normal.  Genitourinary Comments: History of internal hemorrhoids  Musculoskeletal: Normal range of motion. She exhibits edema. She exhibits no tenderness.  BLE trace edema. Pain in the R hip with ROM and weight bearing. Using walker.  Kyphosis.  Lymphadenopathy:    She has no cervical adenopathy.  Neurological: She is alert. She has normal reflexes. No cranial nerve deficit. She exhibits normal muscle tone. Coordination normal.  09/05/14 MMSE 20/30  Skin: Skin is warm and dry. No rash noted. She is not diaphoretic. No erythema. No pallor.  Chronic venous dermatitis BLE  Psychiatric: She has a normal mood and affect. Her behavior is normal. Judgment and thought content normal.    Labs reviewed:  Recent Labs  11/17/15  NA 141  K 4.0  BUN 17  CREATININE 0.8    Recent Labs  11/17/15  AST 17  ALT 11  ALKPHOS 71    Recent Labs  11/17/15  WBC 9.3  HGB 14.0  HCT 42  PLT 293   Lab Results  Component Value Date   TSH 2.13  02/28/2014   No results found for: HGBA1C No results found for: CHOL, HDL, LDLCALC, LDLDIRECT, TRIG, CHOLHDL  Significant Diagnostic Results in last 30 days:  No results found.  Assessment/Plan Abdominal pain, epigastric Resolved, change Hyoscyamine to 0.125mg  bid prn.   Edema BNP 103.1, trace edema BLE  GERD (gastroesophageal reflux disease) mproved, continue Omeprazole and Levsin prn   Constipation Takes Senokot II hs    Benign essential tremor Not disabling.   Alzheimer's disease Continue AL for care assistance, continue  Namenda. May add Aricept once Namenda is well tolerated.   Hereditary and idiopathic peripheral neuropathy chronic  Depression Hx of depression, stable, off Celexa 10mg       Family/ staff Communication:AL  Labs/tests ordered:  none

## 2016-02-27 NOTE — Assessment & Plan Note (Signed)
Hx of depression, stable, off Celexa 10mg  

## 2016-02-27 NOTE — Assessment & Plan Note (Signed)
Takes Senokot II hs   

## 2016-02-27 NOTE — Assessment & Plan Note (Signed)
Resolved, change Hyoscyamine to 0.125mg  bid prn.

## 2016-02-27 NOTE — Assessment & Plan Note (Signed)
BNP 103.1, trace edema BLE

## 2016-02-27 NOTE — Assessment & Plan Note (Signed)
Not disabling.   

## 2016-02-27 NOTE — Assessment & Plan Note (Signed)
chronic

## 2016-02-27 NOTE — Assessment & Plan Note (Signed)
Continue AL for care assistance, continue  Namenda. May add Aricept once Namenda is well tolerated.  

## 2016-02-27 NOTE — Assessment & Plan Note (Signed)
mproved, continue Omeprazole and Levsin prn

## 2016-04-15 ENCOUNTER — Encounter: Payer: Self-pay | Admitting: Internal Medicine

## 2016-04-15 ENCOUNTER — Non-Acute Institutional Stay: Payer: Medicare Other | Admitting: Internal Medicine

## 2016-04-15 DIAGNOSIS — G301 Alzheimer's disease with late onset: Secondary | ICD-10-CM | POA: Diagnosis not present

## 2016-04-15 DIAGNOSIS — R609 Edema, unspecified: Secondary | ICD-10-CM

## 2016-04-15 DIAGNOSIS — G250 Essential tremor: Secondary | ICD-10-CM | POA: Diagnosis not present

## 2016-04-15 DIAGNOSIS — F320 Major depressive disorder, single episode, mild: Secondary | ICD-10-CM

## 2016-04-15 DIAGNOSIS — F0280 Dementia in other diseases classified elsewhere without behavioral disturbance: Secondary | ICD-10-CM

## 2016-04-15 NOTE — Progress Notes (Signed)
Progress Note     Location:  [facility] Room Number: AL 18 Place of Service:  ALF [phone]) Provider:  Jeanmarie Hubert, MD  Patient Care Team: Estill Dooms, MD as PCP - General (Internal Medicine) Jettie Booze, MD as Consulting Physician (Cardiology) Marchia Bond, MD as Consulting Physician (Orthopedic Surgery) Kathrynn Ducking, MD as Consulting Physician (Neurology) Brand Males, MD as Consulting Physician (Pulmonary Disease) Man Otho Darner, NP as Nurse Practitioner (Internal Medicine)  Extended Emergency Contact Information Primary Emergency Contact: [facility] Address: [address]          [place], [place] [address] [place] Phone: [phone] Mobile Phone: [phone] Relation: Son Secondary Emergency Contact: [place] Mobile Phone: [phone] Relation: Son  Code Status:  DNR Goals of care: Advanced Directive information Advanced Directives 04/15/2016  Does Patient Have a Medical Advance Directive? Yes  Type of Paramedic of [place];Living will;Out of facility DNR (pink MOST or yellow form)  Does patient want to make changes to medical advance directive? -  Copy of [place] in Chart? Yes  Pre-existing out of facility DNR order (yellow form or pink MOST form) Yellow form placed in chart (order not valid for inpatient use)     Chief Complaint  Patient presents with  . Medical Management of Chronic Issues    routine visit    HPI:  Pt is a 83 y.o. female seen today for medical management of chronic diseases.    Late onset Alzheimer's disease without behavioral disturbance - unchanged  Benign essential tremor - persistent. Does not interfere with ADL.  Mild single current episode of major depressive disorder ([place]) - improved  Edema, unspecified type - persistent in both lower legs. Not painful.     Past Medical History:   Diagnosis Date  . Alzheimer's disease 06/22/2015  . Anemia    iron deficient  . Anemia   . Anxiety   . Chronic airway obstruction, not elsewhere classified   . Chronic back pain   . Chronic cystitis   . Cystitis, chronic    Dr Gaynelle Arabian  . Degenerative arthritis   . Depression   . Disturbance of skin sensation   . Disturbance of skin sensation   . Dyslipidemia   . Dyspnea    Dr Chase Caller  . Esophageal reflux   . Essential and other specified forms of tremor 12/11/2012  . Foot drop, bilateral 12/21/2013  . Gait disorder   . GERD (gastroesophageal reflux disease)   . Hemorrhoids   . Hereditary and idiopathic peripheral neuropathy 10/11/2015  . Hernia   . History of cerebrovascular disease    Moderate level small vessel disease  . Hyperlipidemia    mild  . Hypertension   . Osteoarthritis    right knee injection per Dr Trudie Reed  . Osteoporosis   . Other abnormal clinical finding   . Pain in joint, upper arm   . Peripheral vascular disease ([place])   . Rheumatoid arthritis ([place])    Dr Trudie Reed  . Tobacco use disorder   . Tremor    and gait disorder--Dr Jannifer Franklin  . Unspecified vitamin D deficiency    Past Surgical History:  Procedure Laterality Date  . APPENDECTOMY    . BACK SURGERY Bilateral    X2  . Bladder resuspension procedure    . CATARACT EXTRACTION Bilateral   . FEMUR IM NAIL Right 02/22/2014   Procedure: INTRAMEDULLARY (  IM) NAIL FEMORAL;  Surgeon: Johnny Bridge, MD;  Location: [facility];  Service: Orthopedics;  Laterality: Right;  . INGUINAL HERNIA REPAIR Bilateral   . TONSILLECTOMY      Allergies  Allergen Reactions  . Novocain [Procaine]     Passed  out  . Limbrel [Flavocoxid]     Dizziness   . Sertraline Anxiety    Allergies as of 04/15/2016      Reactions   Novocain [procaine]    Passed  out   Limbrel [flavocoxid]    Dizziness   Sertraline Anxiety      Medication List       Accurate as of 04/15/16 11:03 AM. Always use your most recent med list.           acetaminophen 650 MG CR tablet Commonly known as:  TYLENOL Take 650 mg by mouth 2 (two) times daily.   CALTRATE 600+D 600-400 MG-UNIT tablet Generic drug:  Calcium Carbonate-Vitamin D Take 1 tablet by mouth daily.   Glucosamine HCl 750 MG Tabs Take by mouth. Take one tablet once a day   hydrocortisone cream 1 % Apply 1 application topically. Apply to rectal area twice daily as needed   LEVSIN/SL 0.125 MG Subl Generic drug:  Hyoscyamine Sulfate SL Place under the tongue. Take one twice daily   memantine 10 MG tablet Commonly known as:  NAMENDA Take 10 mg by mouth 2 (two) times daily.   multivitamin tablet Take 1 tablet by mouth daily.   nystatin cream Commonly known as:  MYCOSTATIN Apply 1 application topically. Apply to affected areas 2 times a day as needed   omeprazole 20 MG capsule Commonly known as:  PRILOSEC Take 20 mg by mouth 2 (two) times daily before a meal.   RESOURCE 2.0 Liqd Take by mouth. 64ml twice daily   sennosides-docusate sodium 8.6-50 MG tablet Commonly known as:  SENOKOT-S Take 2 tablets by mouth daily.   Vitamin D3 10000 units Tabs Take 1 tablet by mouth daily.   zinc oxide 20 % ointment Apply 1 application topically. Apply to perineum and buttocks as needed for redness and iritation       Review of Systems  Constitutional: Negative for activity change, appetite change, chills, diaphoresis, fatigue, fever and unexpected weight change.  HENT: Positive for hearing loss. Negative for congestion, ear discharge, ear pain, nosebleeds, postnasal drip, rhinorrhea, sore throat, tinnitus, trouble swallowing and voice change.   Eyes: Negative for photophobia, pain, discharge, redness, itching and visual disturbance.  Respiratory: Negative for cough, choking, shortness of breath, wheezing and stridor.   Cardiovascular: Positive for leg swelling (1+ bilateral lower leg edema). Negative for chest pain and palpitations.  Gastrointestinal:  Negative for abdominal distention, abdominal pain, anal bleeding, blood in stool, constipation, diarrhea, nausea and vomiting.       Stomach aches in am. Blood seen in toilet with BM on 11/16/15.  Endocrine: Negative for cold intolerance, heat intolerance, polydipsia, polyphagia and polyuria.       Hyperglycemia to 111 mg percent on 11/17/2015  Genitourinary: Positive for frequency. Negative for difficulty urinating, dysuria, flank pain, hematuria, pelvic pain, urgency and vaginal discharge.       Incontinence  Musculoskeletal: Negative for arthralgias, back pain, gait problem, myalgias, neck pain and neck stiffness.       Right hip pain-ambulates with walker. Fx Dec 2015.  Skin: Negative for color change, pallor and rash.       Chronic venous dermatitis  Allergic/Immunologic: Positive for food allergies.  Negative for environmental allergies.  Neurological: Negative for dizziness, tremors, seizures, syncope, weakness, numbness and headaches.       Memory deficit  Hematological: Negative for adenopathy. Does not bruise/bleed easily.  Psychiatric/Behavioral: Positive for confusion and decreased concentration. Negative for agitation, behavioral problems, dysphoric mood, hallucinations, sleep disturbance and suicidal ideas. The patient is nervous/anxious. The patient is not hyperactive.     Immunization History  Administered Date(s) Administered  . Influenza Whole 01/16/2010  . Influenza-Unspecified 12/22/2014, 12/28/2015  . Pneumococcal Polysaccharide-23 01/21/2006  . Pneumococcal-Unspecified 12/19/1998  . Td 03/18/1998   Pertinent  Health Maintenance Due  Topic Date Due  . PNA vac Low Risk Adult (2 of 2 - PCV13) 01/22/2007  . INFLUENZA VACCINE  Completed  . DEXA SCAN  Completed   Fall Risk  06/20/2015  Falls in the past year? No  Risk for fall due to : Impaired balance/gait   Functional Status Survey:    Vitals:   04/15/16 1050  BP: 130/79  Pulse: (!) 102  Resp: (!) 24  Temp:  97.2 F (36.2 C)  SpO2: 94%  Weight: 124 lb 8 oz (56.5 kg)  Height: 5\' 1"  (1.549 m)   Body mass index is 23.52 kg/m. Physical Exam  Constitutional: She appears well-developed and well-nourished. No distress.  HENT:  Head: Normocephalic and atraumatic.  Right Ear: External ear normal.  Left Ear: External ear normal.  Nose: Nose normal.  Mouth/Throat: Oropharynx is clear and moist. No oropharyngeal exudate.  Eyes: Conjunctivae and EOM are normal. Pupils are equal, round, and reactive to light. Right eye exhibits no discharge. Left eye exhibits no discharge. No scleral icterus.  Neck: Normal range of motion. Neck supple. No JVD present. No tracheal deviation present. No thyromegaly present.  Cardiovascular: Normal rate, regular rhythm and intact distal pulses.  Exam reveals no gallop and no friction rub.   Murmur heard.  Systolic murmur is present with a grade of 2/6  Pulmonary/Chest: Effort normal and breath sounds normal. No stridor. No respiratory distress. She has no wheezes. She has no rales. She exhibits no tenderness.  Abdominal: Soft. She exhibits no distension and no mass. There is tenderness (epigastric area with palpation). There is no rebound and no guarding.  Stomach aches in am. Blood seen in toilet on 11/16/15 with BM, internal hemorrhoids-no blood seen on digital rectal check.  Genitourinary: Vagina normal.  Genitourinary Comments: History of internal hemorrhoids  Musculoskeletal: Normal range of motion. She exhibits edema (bilateral 1+ lowere leg edema). She exhibits no tenderness.  Pain in the R hip with ROM and weight bearing. Using walker.  Kyphosis.  Lymphadenopathy:    She has no cervical adenopathy.  Neurological: She is alert. She has normal reflexes. No cranial nerve deficit. She exhibits normal muscle tone. Coordination normal.  09/05/14 MMSE 20/30  Skin: Skin is warm and dry. No rash noted. She is not diaphoretic. No erythema. No pallor.  Chronic venous  dermatitis BLE  Psychiatric: She has a normal mood and affect. Her behavior is normal. Judgment and thought content normal.    Labs reviewed:  Recent Labs  11/17/15  NA 141  K 4.0  BUN 17  CREATININE 0.8    Recent Labs  11/17/15  AST 17  ALT 11  ALKPHOS 71    Recent Labs  11/17/15  WBC 9.3  HGB 14.0  HCT 42  PLT 293   Lab Results  Component Value Date   TSH 2.13 02/28/2014    Assessment/Plan 1. Late onset  Alzheimer's disease without behavioral disturbance unchanged  2. Benign essential tremor unchanged  3. Mild single current episode of major depressive disorder ([place]) improved  4. Edema, unspecified type A little worse as compared to my last visit.

## 2016-05-10 ENCOUNTER — Non-Acute Institutional Stay: Payer: Medicare Other | Admitting: Nurse Practitioner

## 2016-05-10 ENCOUNTER — Encounter: Payer: Self-pay | Admitting: Nurse Practitioner

## 2016-05-10 DIAGNOSIS — K5900 Constipation, unspecified: Secondary | ICD-10-CM | POA: Diagnosis not present

## 2016-05-10 DIAGNOSIS — R609 Edema, unspecified: Secondary | ICD-10-CM

## 2016-05-10 DIAGNOSIS — G609 Hereditary and idiopathic neuropathy, unspecified: Secondary | ICD-10-CM | POA: Diagnosis not present

## 2016-05-10 DIAGNOSIS — K219 Gastro-esophageal reflux disease without esophagitis: Secondary | ICD-10-CM | POA: Diagnosis not present

## 2016-05-10 DIAGNOSIS — F0280 Dementia in other diseases classified elsewhere without behavioral disturbance: Secondary | ICD-10-CM

## 2016-05-10 DIAGNOSIS — G301 Alzheimer's disease with late onset: Secondary | ICD-10-CM

## 2016-05-10 NOTE — Assessment & Plan Note (Signed)
Worse, BLE, Furosemide 20mg  daily, update CBC CMP TSH one week

## 2016-05-10 NOTE — Progress Notes (Signed)
Location:  [facility] Room Number: 18 Place of Service:  ALF [id number]) Provider:  Hendrick Pavich, [place]  NP  Jeanmarie Hubert, MD  Patient Care Team: Estill Dooms, MD as PCP - General (Internal Medicine) Jettie Booze, MD as Consulting Physician (Cardiology) Marchia Bond, MD as Consulting Physician (Orthopedic Surgery) Kathrynn Ducking, MD as Consulting Physician (Neurology) Brand Males, MD as Consulting Physician (Pulmonary Disease) Louden Houseworth Otho Darner, NP as Nurse Practitioner (Internal Medicine)  Extended Emergency Contact Information Primary Emergency Contact: [facility] Address: [address]          [place], [place] [address] [place] Phone: [phone] Mobile Phone: [phone] Relation: Son Secondary Emergency Contact: [place] Mobile Phone: [phone] Relation: Son  Code Status:  DNR Goals of care: Advanced Directive information Advanced Directives 04/15/2016  Does Patient Have a Medical Advance Directive? Yes  Type of Paramedic of [place];Living will;Out of facility DNR (pink MOST or yellow form)  Does patient want to make changes to medical advance directive? -  Copy of [place] in Chart? Yes  Pre-existing out of facility DNR order (yellow form or pink MOST form) Yellow form placed in chart (order not valid for inpatient use)     Chief Complaint  Patient presents with  . Acute Visit    BLE edema,(2+pitting) wheeping    HPI:  Pt is a 83 y.o. female seen today for increased BLE edema, denied SOB, cough, or phlegm production, no O2 desaturation, she is afebrile.    MMSE15/30 10/27/15, newly started Namenda, tolerated.   Hx of depression/anxiety stable, off Celexa 10mg , GERD stable on Omeprazole, Levsin 0.125mg ,constipation is managed with Senokot II tabs daily. Chronic BLE edema, last BNP 11/18/15 103.1   Past Medical History:  Diagnosis  Date  . Alzheimer's disease 06/22/2015  . Anemia    iron deficient  . Anemia   . Anxiety   . Chronic airway obstruction, not elsewhere classified   . Chronic back pain   . Chronic cystitis   . Cystitis, chronic    Dr Gaynelle Arabian  . Degenerative arthritis   . Depression   . Disturbance of skin sensation   . Disturbance of skin sensation   . Dyslipidemia   . Dyspnea    Dr Chase Caller  . Esophageal reflux   . Essential and other specified forms of tremor 12/11/2012  . Foot drop, bilateral 12/21/2013  . Gait disorder   . GERD (gastroesophageal reflux disease)   . Hemorrhoids   . Hereditary and idiopathic peripheral neuropathy 10/11/2015  . Hernia   . History of cerebrovascular disease    Moderate level small vessel disease  . Hyperlipidemia    mild  . Hypertension   . Osteoarthritis    right knee injection per Dr Trudie Reed  . Osteoporosis   . Other abnormal clinical finding   . Pain in joint, upper arm   . Peripheral vascular disease ([place])   . Rheumatoid arthritis ([place])    Dr Trudie Reed  . Tobacco use disorder   . Tremor    and gait disorder--Dr Jannifer Franklin  . Unspecified vitamin D deficiency    Past Surgical History:  Procedure Laterality Date  . APPENDECTOMY    . BACK SURGERY Bilateral    X2  . Bladder resuspension procedure    . CATARACT EXTRACTION Bilateral   . FEMUR IM NAIL Right 02/22/2014   Procedure: INTRAMEDULLARY (IM) NAIL FEMORAL;  Surgeon: Vonna Kotyk  Llana Aliment, MD;  Location: [employer];  Service: Orthopedics;  Laterality: Right;  . INGUINAL HERNIA REPAIR Bilateral   . TONSILLECTOMY      Allergies  Allergen Reactions  . Novocain [Procaine]     Passed  out  . Limbrel [Flavocoxid]     Dizziness   . Sertraline Anxiety    Allergies as of 05/10/2016      Reactions   Novocain [procaine]    Passed  out   Limbrel [flavocoxid]    Dizziness   Sertraline Anxiety      Medication List       Accurate as of 05/10/16  3:56 PM. Always use your most recent med list.            acetaminophen 650 MG CR tablet Commonly known as:  TYLENOL Take 650 mg by mouth 2 (two) times daily.   CALTRATE 600+D 600-400 MG-UNIT tablet Generic drug:  Calcium Carbonate-Vitamin D Take 1 tablet by mouth daily.   Glucosamine HCl 750 MG Tabs Take by mouth. Take one tablet once a day   hydrocortisone cream 1 % Apply 1 application topically. Apply to rectal area twice daily as needed   LEVSIN/SL 0.125 MG Subl Generic drug:  Hyoscyamine Sulfate SL Place under the tongue. Take one twice daily   memantine 10 MG tablet Commonly known as:  NAMENDA Take 10 mg by mouth 2 (two) times daily.   multivitamin tablet Take 1 tablet by mouth daily.   nystatin cream Commonly known as:  MYCOSTATIN Apply 1 application topically. Apply to affected areas 2 times a day as needed   omeprazole 20 MG capsule Commonly known as:  PRILOSEC Take 20 mg by mouth 2 (two) times daily before a meal.   RESOURCE 2.0 Liqd Take by mouth. 66ml twice daily   sennosides-docusate sodium 8.6-50 MG tablet Commonly known as:  SENOKOT-S Take 2 tablets by mouth daily.   Vitamin D3 10000 units Tabs Take 1 tablet by mouth daily.   zinc oxide 20 % ointment Apply 1 application topically. Apply to perineum and buttocks as needed for redness and iritation       Review of Systems  Constitutional: Negative for activity change, appetite change, chills, diaphoresis, fatigue, fever and unexpected weight change.  HENT: Positive for hearing loss. Negative for congestion, ear discharge, ear pain, nosebleeds, postnasal drip, rhinorrhea, sore throat, tinnitus, trouble swallowing and voice change.   Eyes: Negative for photophobia, pain, discharge, redness, itching and visual disturbance.  Respiratory: Negative for cough, choking, shortness of breath, wheezing and stridor.   Cardiovascular: Positive for leg swelling. Negative for chest pain and palpitations.       Trace edema BLE  Gastrointestinal: Negative for abdominal  distention, abdominal pain, anal bleeding, blood in stool, constipation, diarrhea, nausea and vomiting.       Stomach aches in am. Blood seen in toilet with BM on 11/16/15.  Endocrine: Negative for cold intolerance, heat intolerance, polydipsia, polyphagia and polyuria.       Hyperglycemia to 111 mg percent on 11/17/2015  Genitourinary: Positive for frequency. Negative for difficulty urinating, dysuria, flank pain, hematuria, pelvic pain, urgency and vaginal discharge.       Incontinence  Musculoskeletal: Negative for arthralgias, back pain, gait problem, myalgias, neck pain and neck stiffness.       Right hip pain-ambulates with walker. Fx Dec 2015.  Skin: Negative for color change, pallor and rash.       Chronic venous dermatitis  Allergic/Immunologic: Positive for food allergies.  Negative for environmental allergies.  Neurological: Negative for dizziness, tremors, seizures, syncope, weakness, numbness and headaches.       Memory deficit  Hematological: Negative for adenopathy. Does not bruise/bleed easily.  Psychiatric/Behavioral: Positive for confusion and decreased concentration. Negative for agitation, behavioral problems, dysphoric mood, hallucinations, sleep disturbance and suicidal ideas. The patient is nervous/anxious. The patient is not hyperactive.     Immunization History  Administered Date(s) Administered  . Influenza Whole 01/16/2010  . Influenza-Unspecified 12/22/2014, 12/28/2015  . Pneumococcal Polysaccharide-23 01/21/2006  . Pneumococcal-Unspecified 12/19/1998  . Td 03/18/1998   Pertinent  Health Maintenance Due  Topic Date Due  . PNA vac Low Risk Adult (2 of 2 - PCV13) 01/22/2007  . INFLUENZA VACCINE  Completed  . DEXA SCAN  Completed   Fall Risk  06/20/2015  Falls in the past year? No  Risk for fall due to : Impaired balance/gait   Functional Status Survey:    Vitals:   05/10/16 1442  BP: 114/74  Pulse: 97  Resp: (!) 24  Temp: 97.7 F (36.5 C)  SpO2: 94%    Weight: 125 lb (56.7 kg)  Height: 5\' 1"  (1.549 m)   Body mass index is 23.62 kg/m. Physical Exam  Constitutional: She appears well-developed and well-nourished. No distress.  HENT:  Head: Normocephalic and atraumatic.  Right Ear: External ear normal.  Left Ear: External ear normal.  Nose: Nose normal.  Mouth/Throat: Oropharynx is clear and moist. No oropharyngeal exudate.  Eyes: Conjunctivae and EOM are normal. Pupils are equal, round, and reactive to light. Right eye exhibits no discharge. Left eye exhibits no discharge. No scleral icterus.  Neck: Normal range of motion. Neck supple. No JVD present. No tracheal deviation present. No thyromegaly present.  Cardiovascular: Normal rate, regular rhythm and intact distal pulses.  Exam reveals no gallop and no friction rub.   Murmur heard.  Systolic murmur is present with a grade of 2/6  Pulmonary/Chest: Effort normal and breath sounds normal. No stridor. No respiratory distress. She has no wheezes. She has no rales. She exhibits no tenderness.  Abdominal: Soft. She exhibits no distension and no mass. There is tenderness (epigastric area with palpation). There is no rebound and no guarding.  Stomach aches in am. Blood seen in toilet on 11/16/15 with BM, internal hemorrhoids-no blood seen on digital rectal check.  Genitourinary: Vagina normal.  Genitourinary Comments: History of internal hemorrhoids  Musculoskeletal: Normal range of motion. She exhibits edema. She exhibits no tenderness.  BLE trace edema. Pain in the R hip with ROM and weight bearing. Using walker.  Kyphosis.  Lymphadenopathy:    She has no cervical adenopathy.  Neurological: She is alert. She has normal reflexes. No cranial nerve deficit. She exhibits normal muscle tone. Coordination normal.  09/05/14 MMSE 20/30  Skin: Skin is warm and dry. No rash noted. She is not diaphoretic. No erythema. No pallor.  Chronic venous dermatitis BLE  Psychiatric: She has a normal mood and  affect. Her behavior is normal. Judgment and thought content normal.    Labs reviewed:  Recent Labs  11/17/15  NA 141  K 4.0  BUN 17  CREATININE 0.8    Recent Labs  11/17/15  AST 17  ALT 11  ALKPHOS 71    Recent Labs  11/17/15  WBC 9.3  HGB 14.0  HCT 42  PLT 293   Lab Results  Component Value Date   TSH 2.13 02/28/2014   No results found for: HGBA1C No results found for:  CHOL, HDL, LDLCALC, LDLDIRECT, TRIG, CHOLHDL  Significant Diagnostic Results in last 30 days:  No results found.  Assessment/Plan Edema Worse, BLE, Furosemide 20mg  daily, update CBC CMP TSH one week  Constipation Stable, continue Senokot II hs  GERD (gastroesophageal reflux disease) Improved, continue Omeprazole and Levsin    Alzheimer's disease Continue AL for care assistance, continue  Namenda. May add Aricept once Namenda is well tolerated.    Hereditary and idiopathic peripheral neuropathy Tylenol for pain is adequate.      Family/ staff Communication:AL  Labs/tests ordered: CBC CMP TSH

## 2016-05-10 NOTE — Assessment & Plan Note (Signed)
Continue AL for care assistance, continue  Namenda. May add Aricept once Namenda is well tolerated.  

## 2016-05-10 NOTE — Assessment & Plan Note (Signed)
Tylenol for pain is adequate.  

## 2016-05-10 NOTE — Assessment & Plan Note (Signed)
Improved, continue Omeprazole and Levsin 

## 2016-05-10 NOTE — Assessment & Plan Note (Signed)
Stable, continue Senokot II hs 

## 2016-05-16 DIAGNOSIS — I10 Essential (primary) hypertension: Secondary | ICD-10-CM | POA: Diagnosis not present

## 2016-05-16 DIAGNOSIS — E039 Hypothyroidism, unspecified: Secondary | ICD-10-CM | POA: Diagnosis not present

## 2016-05-16 DIAGNOSIS — D649 Anemia, unspecified: Secondary | ICD-10-CM | POA: Diagnosis not present

## 2016-05-16 LAB — BASIC METABOLIC PANEL
BUN: 18 mg/dL (ref 4–21)
CREATININE: 0.9 mg/dL (ref <=1.1)
GLUCOSE: 104 mg/dL
Potassium: 3.9 mmol/L (ref 3.4–5.3)
Sodium: 144 mmol/L (ref 137–147)

## 2016-05-16 LAB — CBC AND DIFFERENTIAL
HEMATOCRIT: 44 % (ref 36–46)
Hemoglobin: 14.3 g/dL (ref 12.0–16.0)
PLATELETS: 260 10*3/uL (ref 150–399)
WBC: 6.9 10*3/mL

## 2016-05-16 LAB — HEPATIC FUNCTION PANEL
ALT: 7 U/L (ref 7–35)
AST: 16 U/L (ref 13–35)
Alkaline Phosphatase: 70 U/L (ref 25–125)
BILIRUBIN, TOTAL: 0.5 mg/dL

## 2016-05-16 LAB — TSH: TSH: 1.49 u[IU]/mL (ref <=5.90)

## 2016-05-17 ENCOUNTER — Other Ambulatory Visit: Payer: Self-pay | Admitting: *Deleted

## 2016-06-24 ENCOUNTER — Encounter: Payer: Self-pay | Admitting: Internal Medicine

## 2016-06-24 ENCOUNTER — Non-Acute Institutional Stay: Payer: Medicare Other | Admitting: Internal Medicine

## 2016-06-24 DIAGNOSIS — R1013 Epigastric pain: Secondary | ICD-10-CM

## 2016-06-24 DIAGNOSIS — F0280 Dementia in other diseases classified elsewhere without behavioral disturbance: Secondary | ICD-10-CM | POA: Diagnosis not present

## 2016-06-24 DIAGNOSIS — G301 Alzheimer's disease with late onset: Secondary | ICD-10-CM

## 2016-06-24 DIAGNOSIS — R609 Edema, unspecified: Secondary | ICD-10-CM | POA: Diagnosis not present

## 2016-06-24 NOTE — Progress Notes (Signed)
Progress Note    Location:  [facility] Room Number: [place] of Service:  ALF [phone]) Provider:  Jeanmarie Hubert, MD  Patient Care Team: Estill Dooms, MD as PCP - General (Internal Medicine) Jettie Booze, MD as Consulting Physician (Cardiology) Marchia Bond, MD as Consulting Physician (Orthopedic Surgery) Kathrynn Ducking, MD as Consulting Physician (Neurology) Brand Males, MD as Consulting Physician (Pulmonary Disease) Man Otho Darner, NP as Nurse Practitioner (Internal Medicine)  Extended Emergency Contact Information Primary Emergency Contact: [facility] Address: [address]          [place], [place] [address] [place] Phone: [phone] Mobile Phone: [phone] Relation: Son Secondary Emergency Contact: [place] of [place] Mobile Phone: [phone] Relation: Son  Code Status:  DNR Goals of care: Advanced Directive information Advanced Directives 06/24/2016  Does Patient Have a Medical Advance Directive? Yes  Type of Paramedic of [place];Living will;Out of facility DNR (pink MOST or yellow form)  Does patient want to make changes to medical advance directive? -  Copy of [place] in Chart? Yes  Pre-existing out of facility DNR order (yellow form or pink MOST form) Yellow form placed in chart (order not valid for inpatient use)     Chief Complaint  Patient presents with  . Medical Management of Chronic Issues    routine visit    HPI:  Pt is a 83 y.o. female seen today for medical management of chronic diseases.    Late onset Alzheimer's disease without behavioral disturbance - progressive memory loss. Remains able to engage in conversation.  Abdominal pain, epigastric - resolved. Currently on hysocyamine and omeprazole. WEill began to try off these meds.  Edema, unspecified type - chronic and realted to chronic venous  insufficiency.    Past Medical History:  Diagnosis Date  . Alzheimer's disease 06/22/2015  . Anemia    iron deficient  . Anemia   . Anxiety   . Chronic airway obstruction, not elsewhere classified   . Chronic back pain   . Chronic cystitis   . Cystitis, chronic    Dr Gaynelle Arabian  . Degenerative arthritis   . Depression   . Disturbance of skin sensation   . Disturbance of skin sensation   . Dyslipidemia   . Dyspnea    Dr Chase Caller  . Esophageal reflux   . Essential and other specified forms of tremor 12/11/2012  . Foot drop, bilateral 12/21/2013  . Gait disorder   . GERD (gastroesophageal reflux disease)   . Hemorrhoids   . Hereditary and idiopathic peripheral neuropathy 10/11/2015  . Hernia   . History of cerebrovascular disease    Moderate level small vessel disease  . Hyperlipidemia    mild  . Hypertension   . Osteoarthritis    right knee injection per Dr Trudie Reed  . Osteoporosis   . Other abnormal clinical finding   . Pain in joint, upper arm   . Peripheral vascular disease ([place])   . Rheumatoid arthritis ([place])    Dr Trudie Reed  . Tobacco use disorder   . Tremor    and gait disorder--Dr Jannifer Franklin  . Unspecified vitamin D deficiency    Past Surgical History:  Procedure Laterality Date  . APPENDECTOMY    . BACK SURGERY Bilateral    X2  . Bladder resuspension procedure    . CATARACT EXTRACTION Bilateral   . FEMUR IM NAIL Right 02/22/2014   Procedure: INTRAMEDULLARY (  IM) NAIL FEMORAL;  Surgeon: Johnny Bridge, MD;  Location: [facility];  Service: Orthopedics;  Laterality: Right;  . INGUINAL HERNIA REPAIR Bilateral   . TONSILLECTOMY      Allergies  Allergen Reactions  . Novocain [Procaine]     Passed  out  . Limbrel [Flavocoxid]     Dizziness   . Sertraline Anxiety    Allergies as of 06/24/2016      Reactions   Novocain [procaine]    Passed  out   Limbrel [flavocoxid]    Dizziness   Sertraline Anxiety      Medication List       Accurate as of 06/24/16  2:52  PM. Always use your most recent med list.          acetaminophen 650 MG CR tablet Commonly known as:  TYLENOL Take 650 mg by mouth 2 (two) times daily.   CALTRATE 600+D 600-400 MG-UNIT tablet Generic drug:  Calcium Carbonate-Vitamin D Take 1 tablet by mouth daily.   furosemide 20 MG tablet Commonly known as:  LASIX Take 20 mg by mouth. Take one tablet daily   Glucosamine HCl 750 MG Tabs Take by mouth. Take one tablet once a day   hydrocortisone cream 1 % Apply 1 application topically. Apply to rectal area twice daily as needed   LEVSIN/SL 0.125 MG Subl Generic drug:  Hyoscyamine Sulfate SL Place under the tongue. Take one twice daily   memantine 10 MG tablet Commonly known as:  NAMENDA Take 10 mg by mouth 2 (two) times daily.   multivitamin tablet Take 1 tablet by mouth daily.   nystatin cream Commonly known as:  MYCOSTATIN Apply 1 application topically. Apply to affected areas 2 times a day as needed   omeprazole 20 MG capsule Commonly known as:  PRILOSEC Take 20 mg by mouth 2 (two) times daily before a meal.   RESOURCE 2.0 Liqd Take by mouth. 97ml twice daily   sennosides-docusate sodium 8.6-50 MG tablet Commonly known as:  SENOKOT-S Take 2 tablets by mouth daily.   Vitamin D3 10000 units Tabs Take 1 tablet by mouth daily.   zinc oxide 20 % ointment Apply 1 application topically. Apply to perineum and buttocks as needed for redness and iritation       Review of Systems  Constitutional: Negative for activity change, appetite change, chills, diaphoresis, fatigue, fever and unexpected weight change.  HENT: Positive for hearing loss. Negative for congestion, ear discharge, ear pain, nosebleeds, postnasal drip, rhinorrhea, sore throat, tinnitus, trouble swallowing and voice change.   Eyes: Negative for photophobia, pain, discharge, redness, itching and visual disturbance.  Respiratory: Negative for cough, choking, shortness of breath, wheezing and stridor.    Cardiovascular: Positive for leg swelling (1+ bilateral lower leg edema). Negative for chest pain and palpitations.  Gastrointestinal: Negative for abdominal distention, abdominal pain, anal bleeding, blood in stool, constipation, diarrhea, nausea and vomiting.       Stomach aches in am. Blood seen in toilet with BM on 11/16/15.  Endocrine: Negative for cold intolerance, heat intolerance, polydipsia, polyphagia and polyuria.       Hyperglycemia to 111 mg percent on 11/17/2015  Genitourinary: Positive for frequency. Negative for difficulty urinating, dysuria, flank pain, hematuria, pelvic pain, urgency and vaginal discharge.       Incontinence  Musculoskeletal: Negative for arthralgias, back pain, gait problem, myalgias, neck pain and neck stiffness.       Right hip pain-ambulates with walker. Fx Dec 2015.  Skin: Negative  for color change, pallor and rash.       Chronic venous dermatitis  Allergic/Immunologic: Positive for food allergies. Negative for environmental allergies.  Neurological: Negative for dizziness, tremors, seizures, syncope, weakness, numbness and headaches.       Memory deficit  Hematological: Negative for adenopathy. Does not bruise/bleed easily.  Psychiatric/Behavioral: Positive for confusion and decreased concentration. Negative for agitation, behavioral problems, dysphoric mood, hallucinations, sleep disturbance and suicidal ideas. The patient is nervous/anxious. The patient is not hyperactive.     Immunization History  Administered Date(s) Administered  . Influenza Whole 01/16/2010  . Influenza-Unspecified 12/22/2014, 12/28/2015  . Pneumococcal Polysaccharide-23 01/21/2006  . Pneumococcal-Unspecified 12/19/1998  . Td 03/18/1998   Pertinent  Health Maintenance Due  Topic Date Due  . PNA vac Low Risk Adult (2 of 2 - PCV13) 01/22/2007  . INFLUENZA VACCINE  10/16/2016  . DEXA SCAN  Completed   Fall Risk  06/20/2015  Falls in the past year? No  Risk for fall due to :  Impaired balance/gait   Functional Status Survey:    Vitals:   06/24/16 1446  BP: 115/73  Pulse: (!) 114  Resp: (!) 30  Temp: 97.5 F (36.4 C)  SpO2: 95%  Weight: 124 lb 8 oz (56.5 kg)  Height: 5\' 1"  (1.549 m)   Body mass index is 23.52 kg/m. Physical Exam  Constitutional: She appears well-developed and well-nourished. No distress.  HENT:  Head: Normocephalic and atraumatic.  Right Ear: External ear normal.  Left Ear: External ear normal.  Nose: Nose normal.  Mouth/Throat: Oropharynx is clear and moist. No oropharyngeal exudate.  Eyes: Conjunctivae and EOM are normal. Pupils are equal, round, and reactive to light. Right eye exhibits no discharge. Left eye exhibits no discharge. No scleral icterus.  Neck: Normal range of motion. Neck supple. No JVD present. No tracheal deviation present. No thyromegaly present.  Cardiovascular: Normal rate, regular rhythm and intact distal pulses.  Exam reveals no gallop and no friction rub.   Murmur heard.  Systolic murmur is present with a grade of 2/6  Pulmonary/Chest: Effort normal and breath sounds normal. No stridor. No respiratory distress. She has no wheezes. She has no rales. She exhibits no tenderness.  Abdominal: Soft. She exhibits no distension and no mass. There is tenderness (epigastric area with palpation). There is no rebound and no guarding.  Stomach aches in am. Blood seen in toilet on 11/16/15 with BM, internal hemorrhoids-no blood seen on digital rectal check.  Genitourinary: Vagina normal.  Genitourinary Comments: History of internal hemorrhoids  Musculoskeletal: Normal range of motion. She exhibits edema. She exhibits no tenderness.  BLE trace edema. Pain in the R hip with ROM and weight bearing. Using walker.  Kyphosis.  Lymphadenopathy:    She has no cervical adenopathy.  Neurological: She is alert. She has normal reflexes. No cranial nerve deficit. She exhibits normal muscle tone. Coordination normal.  09/05/14 MMSE  20/30  Skin: Skin is warm and dry. No rash noted. She is not diaphoretic. No erythema. No pallor.  Chronic venous stasis dermatitis BLE  Psychiatric: She has a normal mood and affect. Her behavior is normal. Judgment and thought content normal.    Labs reviewed:  Recent Labs  11/17/15 05/16/16  NA 141 144  K 4.0 3.9  BUN 17 18  CREATININE 0.8 0.9    Recent Labs  11/17/15 05/16/16  AST 17 16  ALT 11 7  ALKPHOS 71 70    Recent Labs  11/17/15 05/16/16  WBC  9.3 6.9  HGB 14.0 14.3  HCT 42 44  PLT 293 260   Lab Results  Component Value Date   TSH 1.49 05/16/2016    Assessment/Plan 1. Late onset Alzheimer's disease without behavioral disturbance unchanged  2. Abdominal pain, epigastric resolved  3. Edema, unspecified type Chronic and unchanged

## 2016-07-19 DIAGNOSIS — R410 Disorientation, unspecified: Secondary | ICD-10-CM | POA: Diagnosis not present

## 2016-07-22 DIAGNOSIS — F320 Major depressive disorder, single episode, mild: Secondary | ICD-10-CM | POA: Diagnosis not present

## 2016-07-22 DIAGNOSIS — R2990 Unspecified symptoms and signs involving the nervous system: Secondary | ICD-10-CM | POA: Diagnosis not present

## 2016-07-22 DIAGNOSIS — M4800 Spinal stenosis, site unspecified: Secondary | ICD-10-CM | POA: Diagnosis not present

## 2016-07-22 DIAGNOSIS — D649 Anemia, unspecified: Secondary | ICD-10-CM | POA: Diagnosis not present

## 2016-07-22 DIAGNOSIS — N814 Uterovaginal prolapse, unspecified: Secondary | ICD-10-CM | POA: Diagnosis not present

## 2016-07-22 DIAGNOSIS — R262 Difficulty in walking, not elsewhere classified: Secondary | ICD-10-CM | POA: Diagnosis not present

## 2016-07-22 DIAGNOSIS — R41841 Cognitive communication deficit: Secondary | ICD-10-CM | POA: Diagnosis not present

## 2016-07-22 DIAGNOSIS — M1990 Unspecified osteoarthritis, unspecified site: Secondary | ICD-10-CM | POA: Diagnosis not present

## 2016-07-22 DIAGNOSIS — Z4889 Encounter for other specified surgical aftercare: Secondary | ICD-10-CM | POA: Diagnosis not present

## 2016-07-22 DIAGNOSIS — Z9181 History of falling: Secondary | ICD-10-CM | POA: Diagnosis not present

## 2016-07-22 DIAGNOSIS — R0600 Dyspnea, unspecified: Secondary | ICD-10-CM | POA: Diagnosis not present

## 2016-07-22 DIAGNOSIS — M6281 Muscle weakness (generalized): Secondary | ICD-10-CM | POA: Diagnosis not present

## 2016-07-22 DIAGNOSIS — K219 Gastro-esophageal reflux disease without esophagitis: Secondary | ICD-10-CM | POA: Diagnosis not present

## 2016-07-22 DIAGNOSIS — S7291XA Unspecified fracture of right femur, initial encounter for closed fracture: Secondary | ICD-10-CM | POA: Diagnosis not present

## 2016-07-22 DIAGNOSIS — R270 Ataxia, unspecified: Secondary | ICD-10-CM | POA: Diagnosis not present

## 2016-07-22 DIAGNOSIS — R278 Other lack of coordination: Secondary | ICD-10-CM | POA: Diagnosis not present

## 2016-07-22 DIAGNOSIS — M5134 Other intervertebral disc degeneration, thoracic region: Secondary | ICD-10-CM | POA: Diagnosis not present

## 2016-07-22 DIAGNOSIS — J984 Other disorders of lung: Secondary | ICD-10-CM | POA: Diagnosis not present

## 2016-07-22 DIAGNOSIS — R279 Unspecified lack of coordination: Secondary | ICD-10-CM | POA: Diagnosis not present

## 2016-07-23 DIAGNOSIS — Z9181 History of falling: Secondary | ICD-10-CM | POA: Diagnosis not present

## 2016-07-23 DIAGNOSIS — M1990 Unspecified osteoarthritis, unspecified site: Secondary | ICD-10-CM | POA: Diagnosis not present

## 2016-07-23 DIAGNOSIS — M6281 Muscle weakness (generalized): Secondary | ICD-10-CM | POA: Diagnosis not present

## 2016-07-23 DIAGNOSIS — F320 Major depressive disorder, single episode, mild: Secondary | ICD-10-CM | POA: Diagnosis not present

## 2016-07-23 DIAGNOSIS — R278 Other lack of coordination: Secondary | ICD-10-CM | POA: Diagnosis not present

## 2016-07-23 DIAGNOSIS — R262 Difficulty in walking, not elsewhere classified: Secondary | ICD-10-CM | POA: Diagnosis not present

## 2016-07-24 DIAGNOSIS — L570 Actinic keratosis: Secondary | ICD-10-CM | POA: Diagnosis not present

## 2016-07-24 DIAGNOSIS — F320 Major depressive disorder, single episode, mild: Secondary | ICD-10-CM | POA: Diagnosis not present

## 2016-07-24 DIAGNOSIS — Z9181 History of falling: Secondary | ICD-10-CM | POA: Diagnosis not present

## 2016-07-24 DIAGNOSIS — R262 Difficulty in walking, not elsewhere classified: Secondary | ICD-10-CM | POA: Diagnosis not present

## 2016-07-24 DIAGNOSIS — M6281 Muscle weakness (generalized): Secondary | ICD-10-CM | POA: Diagnosis not present

## 2016-07-24 DIAGNOSIS — D0462 Carcinoma in situ of skin of left upper limb, including shoulder: Secondary | ICD-10-CM | POA: Diagnosis not present

## 2016-07-24 DIAGNOSIS — M1990 Unspecified osteoarthritis, unspecified site: Secondary | ICD-10-CM | POA: Diagnosis not present

## 2016-07-24 DIAGNOSIS — R278 Other lack of coordination: Secondary | ICD-10-CM | POA: Diagnosis not present

## 2016-07-24 DIAGNOSIS — L821 Other seborrheic keratosis: Secondary | ICD-10-CM | POA: Diagnosis not present

## 2016-07-24 DIAGNOSIS — D485 Neoplasm of uncertain behavior of skin: Secondary | ICD-10-CM | POA: Diagnosis not present

## 2016-07-26 DIAGNOSIS — Z9181 History of falling: Secondary | ICD-10-CM | POA: Diagnosis not present

## 2016-07-26 DIAGNOSIS — R262 Difficulty in walking, not elsewhere classified: Secondary | ICD-10-CM | POA: Diagnosis not present

## 2016-07-26 DIAGNOSIS — M6281 Muscle weakness (generalized): Secondary | ICD-10-CM | POA: Diagnosis not present

## 2016-07-26 DIAGNOSIS — M1990 Unspecified osteoarthritis, unspecified site: Secondary | ICD-10-CM | POA: Diagnosis not present

## 2016-07-26 DIAGNOSIS — F320 Major depressive disorder, single episode, mild: Secondary | ICD-10-CM | POA: Diagnosis not present

## 2016-07-26 DIAGNOSIS — R278 Other lack of coordination: Secondary | ICD-10-CM | POA: Diagnosis not present

## 2016-07-29 DIAGNOSIS — Z9181 History of falling: Secondary | ICD-10-CM | POA: Diagnosis not present

## 2016-07-29 DIAGNOSIS — R278 Other lack of coordination: Secondary | ICD-10-CM | POA: Diagnosis not present

## 2016-07-29 DIAGNOSIS — M1990 Unspecified osteoarthritis, unspecified site: Secondary | ICD-10-CM | POA: Diagnosis not present

## 2016-07-29 DIAGNOSIS — M6281 Muscle weakness (generalized): Secondary | ICD-10-CM | POA: Diagnosis not present

## 2016-07-29 DIAGNOSIS — F320 Major depressive disorder, single episode, mild: Secondary | ICD-10-CM | POA: Diagnosis not present

## 2016-07-29 DIAGNOSIS — R262 Difficulty in walking, not elsewhere classified: Secondary | ICD-10-CM | POA: Diagnosis not present

## 2016-07-30 DIAGNOSIS — R278 Other lack of coordination: Secondary | ICD-10-CM | POA: Diagnosis not present

## 2016-07-30 DIAGNOSIS — Z9181 History of falling: Secondary | ICD-10-CM | POA: Diagnosis not present

## 2016-07-30 DIAGNOSIS — F320 Major depressive disorder, single episode, mild: Secondary | ICD-10-CM | POA: Diagnosis not present

## 2016-07-30 DIAGNOSIS — M6281 Muscle weakness (generalized): Secondary | ICD-10-CM | POA: Diagnosis not present

## 2016-07-30 DIAGNOSIS — R262 Difficulty in walking, not elsewhere classified: Secondary | ICD-10-CM | POA: Diagnosis not present

## 2016-07-30 DIAGNOSIS — M1990 Unspecified osteoarthritis, unspecified site: Secondary | ICD-10-CM | POA: Diagnosis not present

## 2016-08-01 DIAGNOSIS — Z9181 History of falling: Secondary | ICD-10-CM | POA: Diagnosis not present

## 2016-08-01 DIAGNOSIS — M6281 Muscle weakness (generalized): Secondary | ICD-10-CM | POA: Diagnosis not present

## 2016-08-01 DIAGNOSIS — F320 Major depressive disorder, single episode, mild: Secondary | ICD-10-CM | POA: Diagnosis not present

## 2016-08-01 DIAGNOSIS — R278 Other lack of coordination: Secondary | ICD-10-CM | POA: Diagnosis not present

## 2016-08-01 DIAGNOSIS — R262 Difficulty in walking, not elsewhere classified: Secondary | ICD-10-CM | POA: Diagnosis not present

## 2016-08-01 DIAGNOSIS — M1990 Unspecified osteoarthritis, unspecified site: Secondary | ICD-10-CM | POA: Diagnosis not present

## 2016-08-02 DIAGNOSIS — F320 Major depressive disorder, single episode, mild: Secondary | ICD-10-CM | POA: Diagnosis not present

## 2016-08-02 DIAGNOSIS — R262 Difficulty in walking, not elsewhere classified: Secondary | ICD-10-CM | POA: Diagnosis not present

## 2016-08-02 DIAGNOSIS — R278 Other lack of coordination: Secondary | ICD-10-CM | POA: Diagnosis not present

## 2016-08-02 DIAGNOSIS — Z9181 History of falling: Secondary | ICD-10-CM | POA: Diagnosis not present

## 2016-08-02 DIAGNOSIS — M6281 Muscle weakness (generalized): Secondary | ICD-10-CM | POA: Diagnosis not present

## 2016-08-02 DIAGNOSIS — M1990 Unspecified osteoarthritis, unspecified site: Secondary | ICD-10-CM | POA: Diagnosis not present

## 2016-08-05 DIAGNOSIS — Z9181 History of falling: Secondary | ICD-10-CM | POA: Diagnosis not present

## 2016-08-05 DIAGNOSIS — M1990 Unspecified osteoarthritis, unspecified site: Secondary | ICD-10-CM | POA: Diagnosis not present

## 2016-08-05 DIAGNOSIS — R278 Other lack of coordination: Secondary | ICD-10-CM | POA: Diagnosis not present

## 2016-08-05 DIAGNOSIS — F320 Major depressive disorder, single episode, mild: Secondary | ICD-10-CM | POA: Diagnosis not present

## 2016-08-05 DIAGNOSIS — R262 Difficulty in walking, not elsewhere classified: Secondary | ICD-10-CM | POA: Diagnosis not present

## 2016-08-05 DIAGNOSIS — M6281 Muscle weakness (generalized): Secondary | ICD-10-CM | POA: Diagnosis not present

## 2016-08-06 ENCOUNTER — Encounter: Payer: Self-pay | Admitting: Nurse Practitioner

## 2016-08-06 ENCOUNTER — Non-Acute Institutional Stay: Payer: Medicare Other | Admitting: Nurse Practitioner

## 2016-08-06 DIAGNOSIS — F320 Major depressive disorder, single episode, mild: Secondary | ICD-10-CM | POA: Diagnosis not present

## 2016-08-06 DIAGNOSIS — R278 Other lack of coordination: Secondary | ICD-10-CM | POA: Diagnosis not present

## 2016-08-06 DIAGNOSIS — F0280 Dementia in other diseases classified elsewhere without behavioral disturbance: Secondary | ICD-10-CM

## 2016-08-06 DIAGNOSIS — G609 Hereditary and idiopathic neuropathy, unspecified: Secondary | ICD-10-CM

## 2016-08-06 DIAGNOSIS — Z9181 History of falling: Secondary | ICD-10-CM | POA: Diagnosis not present

## 2016-08-06 DIAGNOSIS — K5900 Constipation, unspecified: Secondary | ICD-10-CM | POA: Diagnosis not present

## 2016-08-06 DIAGNOSIS — G301 Alzheimer's disease with late onset: Secondary | ICD-10-CM | POA: Diagnosis not present

## 2016-08-06 DIAGNOSIS — R609 Edema, unspecified: Secondary | ICD-10-CM | POA: Diagnosis not present

## 2016-08-06 DIAGNOSIS — R262 Difficulty in walking, not elsewhere classified: Secondary | ICD-10-CM | POA: Diagnosis not present

## 2016-08-06 DIAGNOSIS — K219 Gastro-esophageal reflux disease without esophagitis: Secondary | ICD-10-CM

## 2016-08-06 DIAGNOSIS — M6281 Muscle weakness (generalized): Secondary | ICD-10-CM | POA: Diagnosis not present

## 2016-08-06 DIAGNOSIS — G250 Essential tremor: Secondary | ICD-10-CM | POA: Diagnosis not present

## 2016-08-06 DIAGNOSIS — M1990 Unspecified osteoarthritis, unspecified site: Secondary | ICD-10-CM | POA: Diagnosis not present

## 2016-08-06 NOTE — Assessment & Plan Note (Signed)
Tylenol for pain is adequate.

## 2016-08-06 NOTE — Assessment & Plan Note (Signed)
Stable. BNP 103 11/18/15

## 2016-08-06 NOTE — Assessment & Plan Note (Signed)
Improved, continue Omeprazole and Levsin

## 2016-08-06 NOTE — Progress Notes (Signed)
Location:  [facility] [place] Room Number: 18 Place of Service:  ALF [phone]) Provider:  Mast, Manxie  NP  Estill Dooms, MD  Patient Care Team: Estill Dooms, MD as PCP - General (Internal Medicine) Jettie Booze, MD as Consulting Physician (Cardiology) Marchia Bond, MD as Consulting Physician (Orthopedic Surgery) Kathrynn Ducking, MD as Consulting Physician (Neurology) Brand Males, MD as Consulting Physician (Pulmonary Disease) Mast, Man X, NP as Nurse Practitioner (Internal Medicine)  Extended Emergency Contact Information Primary Emergency Contact: [facility] Address: [address]          [place], [place] [address] [place] Phone: [phone] Mobile Phone: [phone] Relation: Son Secondary Emergency Contact: [place] Mobile Phone: [phone] Relation: Son  Code Status:  DNR Goals of care: Advanced Directive information Advanced Directives 08/06/2016  Does Patient Have a Medical Advance Directive? Yes  Type of Paramedic of [place];Living will;Out of facility DNR (pink MOST or yellow form)  Does patient want to make changes to medical advance directive? No - Patient declined  Copy of [place] in Chart? Yes  Pre-existing out of facility DNR order (yellow form or pink MOST form) Yellow form placed in chart (order not valid for inpatient use)     Chief Complaint  Patient presents with  . Medical Management of Chronic Issues    HPI:  Pt is a 83 y.o. female seen today for management of her chronic medical conditions.    MMSE15/30 10/27/15, taking Namenda, tolerated, resides in AL for care needs.   Hx of depression/anxiety stable, off Celexa 10mg , GERD stable on Omeprazole, Levsin 0.125mg ,constipation is managed with Senokot II tabs daily. Chronic BLE edema, taking Furosemide 20mg  qd,  last BNP 11/18/15 103.1   Past Medical  History:  Diagnosis Date  . Alzheimer's disease 06/22/2015  . Anemia    iron deficient  . Anemia   . Anxiety   . Chronic airway obstruction, not elsewhere classified   . Chronic back pain   . Chronic cystitis   . Cystitis, chronic    Dr Gaynelle Arabian  . Degenerative arthritis   . Depression   . Disturbance of skin sensation   . Disturbance of skin sensation   . Dyslipidemia   . Dyspnea    Dr Chase Caller  . Esophageal reflux   . Essential and other specified forms of tremor 12/11/2012  . Foot drop, bilateral 12/21/2013  . Gait disorder   . GERD (gastroesophageal reflux disease)   . Hemorrhoids   . Hereditary and idiopathic peripheral neuropathy 10/11/2015  . Hernia   . History of cerebrovascular disease    Moderate level small vessel disease  . Hyperlipidemia    mild  . Hypertension   . Osteoarthritis    right knee injection per Dr Trudie Reed  . Osteoporosis   . Other abnormal clinical finding   . Pain in joint, upper arm   . Peripheral vascular disease ([place])   . Rheumatoid arthritis ([place])    Dr Trudie Reed  . Tobacco use disorder   . Tremor    and gait disorder--Dr Jannifer Franklin  . Unspecified vitamin D deficiency    Past Surgical History:  Procedure Laterality Date  . APPENDECTOMY    . BACK SURGERY Bilateral    X2  . Bladder resuspension procedure    . CATARACT EXTRACTION Bilateral   . FEMUR IM NAIL Right 02/22/2014   Procedure: INTRAMEDULLARY (IM) NAIL FEMORAL;  Surgeon: Johnny Bridge, MD;  Location: [facility];  Service: Orthopedics;  Laterality: Right;  . INGUINAL HERNIA REPAIR Bilateral   . TONSILLECTOMY      Allergies  Allergen Reactions  . Novocain [Procaine]     Passed  out  . Limbrel [Flavocoxid]     Dizziness   . Sertraline Anxiety    Allergies as of 08/06/2016      Reactions   Novocain [procaine]    Passed  out   Limbrel [flavocoxid]    Dizziness   Sertraline Anxiety      Medication List       Accurate as of 08/06/16 11:59 PM. Always use your most recent med  list.          acetaminophen 650 MG CR tablet Commonly known as:  TYLENOL Take 650 mg by mouth 2 (two) times daily.   CALTRATE 600+D 600-400 MG-UNIT tablet Generic drug:  Calcium Carbonate-Vitamin D Take 1 tablet by mouth daily.   furosemide 20 MG tablet Commonly known as:  LASIX Take 20 mg by mouth. Take one tablet daily   hydrocortisone cream 1 % Apply 1 application topically. Apply to rectal area twice daily as needed   LEVSIN/SL 0.125 MG Subl Generic drug:  Hyoscyamine Sulfate SL Place under the tongue. Take one twice daily   memantine 10 MG tablet Commonly known as:  NAMENDA Take 10 mg by mouth 2 (two) times daily.   multivitamin tablet Take 1 tablet by mouth daily.   nystatin cream Commonly known as:  MYCOSTATIN Apply 1 application topically. Apply to affected areas 2 times a day as needed   ranitidine 150 MG tablet Commonly known as:  ZANTAC Take 150 mg by mouth 2 (two) times daily.   RESOURCE 2.0 Liqd Take by mouth. 14ml twice daily   sennosides-docusate sodium 8.6-50 MG tablet Commonly known as:  SENOKOT-S Take 2 tablets by mouth daily.   Vitamin D3 10000 units Tabs Take 1 tablet by mouth daily.   zinc oxide 20 % ointment Apply 1 application topically. Apply to perineum and buttocks as needed for redness and iritation       Review of Systems  Constitutional: Negative for activity change, appetite change, chills, diaphoresis, fatigue, fever and unexpected weight change.  HENT: Positive for hearing loss. Negative for congestion, ear discharge, ear pain, nosebleeds, postnasal drip, rhinorrhea, sore throat, tinnitus, trouble swallowing and voice change.   Eyes: Negative for photophobia, pain, discharge, redness, itching and visual disturbance.  Respiratory: Negative for cough, choking, shortness of breath, wheezing and stridor.   Cardiovascular: Positive for leg swelling. Negative for chest pain and palpitations.       Trace edema BLE    Gastrointestinal: Negative for abdominal distention, abdominal pain, anal bleeding, blood in stool, constipation, diarrhea, nausea and vomiting.       Stomach aches in am. Blood seen in toilet with BM on 11/16/15.  Endocrine: Negative for cold intolerance, heat intolerance, polydipsia, polyphagia and polyuria.       Hyperglycemia to 111 mg percent on 11/17/2015  Genitourinary: Positive for frequency. Negative for difficulty urinating, dysuria, flank pain, hematuria, pelvic pain, urgency and vaginal discharge.       Incontinence  Musculoskeletal: Negative for arthralgias, back pain, gait problem, myalgias, neck pain and neck stiffness.       Right hip pain-ambulates with walker. Fx Dec 2015.  Skin: Negative for color change, pallor and rash.       Chronic venous dermatitis  Allergic/Immunologic: Positive for  food allergies. Negative for environmental allergies.  Neurological: Negative for dizziness, tremors, seizures, syncope, weakness, numbness and headaches.       Memory deficit  Hematological: Negative for adenopathy. Does not bruise/bleed easily.  Psychiatric/Behavioral: Positive for confusion and decreased concentration. Negative for agitation, behavioral problems, dysphoric mood, hallucinations, sleep disturbance and suicidal ideas. The patient is nervous/anxious. The patient is not hyperactive.     Immunization History  Administered Date(s) Administered  . Influenza Whole 01/16/2010  . Influenza-Unspecified 12/22/2014, 12/28/2015  . Pneumococcal Polysaccharide-23 01/21/2006  . Pneumococcal-Unspecified 12/19/1998  . Td 03/18/1998   Pertinent  Health Maintenance Due  Topic Date Due  . PNA vac Low Risk Adult (2 of 2 - PCV13) 01/22/2007  . INFLUENZA VACCINE  10/16/2016  . DEXA SCAN  Completed   Fall Risk  06/20/2015  Falls in the past year? No  Risk for fall due to : Impaired balance/gait   Functional Status Survey:    Vitals:   08/06/16 1541  BP: 127/90  Pulse: 78  Resp:  18  Temp: 99.1 F (37.3 C)  SpO2: 98%  Weight: 126 lb 12.8 oz (57.5 kg)  Height: 5\' 1"  (1.549 m)   Body mass index is 23.96 kg/m. Physical Exam  Constitutional: She appears well-developed and well-nourished. No distress.  HENT:  Head: Normocephalic and atraumatic.  Right Ear: External ear normal.  Left Ear: External ear normal.  Nose: Nose normal.  Mouth/Throat: Oropharynx is clear and moist. No oropharyngeal exudate.  Eyes: Conjunctivae and EOM are normal. Pupils are equal, round, and reactive to light. Right eye exhibits no discharge. Left eye exhibits no discharge. No scleral icterus.  Neck: Normal range of motion. Neck supple. No JVD present. No tracheal deviation present. No thyromegaly present.  Cardiovascular: Normal rate, regular rhythm and intact distal pulses.  Exam reveals no gallop and no friction rub.   Murmur heard.  Systolic murmur is present with a grade of 2/6  Pulmonary/Chest: Effort normal and breath sounds normal. No stridor. No respiratory distress. She has no wheezes. She has no rales. She exhibits no tenderness.  Abdominal: Soft. She exhibits no distension and no mass. There is tenderness (epigastric area with palpation). There is no rebound and no guarding.  Stomach aches in am. Blood seen in toilet on 11/16/15 with BM, internal hemorrhoids-no blood seen on digital rectal check.  Genitourinary: Vagina normal.  Genitourinary Comments: History of internal hemorrhoids  Musculoskeletal: Normal range of motion. She exhibits edema. She exhibits no tenderness.  BLE trace edema. Pain in the R hip with ROM and weight bearing. Using walker.  Kyphosis.  Lymphadenopathy:    She has no cervical adenopathy.  Neurological: She is alert. She has normal reflexes. No cranial nerve deficit. She exhibits normal muscle tone. Coordination normal.  09/05/14 MMSE 20/30  Skin: Skin is warm and dry. No rash noted. She is not diaphoretic. No erythema. No pallor.  Chronic venous  dermatitis BLE  Psychiatric: She has a normal mood and affect. Her behavior is normal. Judgment and thought content normal.    Labs reviewed:  Recent Labs  11/17/15 05/16/16  NA 141 144  K 4.0 3.9  BUN 17 18  CREATININE 0.8 0.9    Recent Labs  11/17/15 05/16/16  AST 17 16  ALT 11 7  ALKPHOS 71 70    Recent Labs  11/17/15 05/16/16  WBC 9.3 6.9  HGB 14.0 14.3  HCT 42 44  PLT 293 260   Lab Results  Component Value Date  TSH 1.49 05/16/2016   No results found for: HGBA1C No results found for: CHOL, HDL, LDLCALC, LDLDIRECT, TRIG, CHOLHDL  Significant Diagnostic Results in last 30 days:  No results found.  Assessment/Plan HYPERTENSION, PULMONARY Stable. BNP 103 11/18/15  Constipation Stable, continue Senokot II hs  GERD (gastroesophageal reflux disease) Improved, continue Omeprazole and Levsin  Benign essential tremor Not disabling.   Alzheimer's disease Continue AL for care assistance, continue  Namenda. May add Aricept once Namenda is well tolerated.   Hereditary and idiopathic peripheral neuropathy Tylenol for pain is adequate.   Edema Chronic, BLE, stable, continue Furosemide 20mg  daily  Depression Hx of depression, stable, off Celexa 10mg       Family/ staff Communication:AL  Labs/tests ordered: none

## 2016-08-06 NOTE — Assessment & Plan Note (Signed)
Chronic, BLE, stable, continue Furosemide 20mg  daily

## 2016-08-06 NOTE — Assessment & Plan Note (Signed)
Continue AL for care assistance, continue  Namenda. May add Aricept once Namenda is well tolerated.

## 2016-08-06 NOTE — Assessment & Plan Note (Signed)
Stable, continue Senokot II hs

## 2016-08-06 NOTE — Progress Notes (Signed)
Location:  [place] [facility] Room Number: 18 Place of Service:  ALF [id number]) Provider:  Mast, Manxie  NP  Estill Dooms, MD  Patient Care Team: Estill Dooms, MD as PCP - General (Internal Medicine) Jettie Booze, MD as Consulting Physician (Cardiology) Marchia Bond, MD as Consulting Physician (Orthopedic Surgery) Kathrynn Ducking, MD as Consulting Physician (Neurology) Brand Males, MD as Consulting Physician (Pulmonary Disease) Mast, Man X, NP as Nurse Practitioner (Internal Medicine)  Extended Emergency Contact Information Primary Emergency Contact: [facility] Address: [address]          [place], [place] [address] [place] Phone: [phone] Mobile Phone: [phone] Relation: Son Secondary Emergency Contact: [place] of [place] Mobile Phone: [phone] Relation: Son  Code Status:  DNR Goals of care: Advanced Directive information Advanced Directives 08/06/2016  Does Patient Have a Medical Advance Directive? Yes  Type of Paramedic of [place];Living will;Out of facility DNR (pink MOST or yellow form)  Does patient want to make changes to medical advance directive? No - Patient declined  Copy of [place] in Chart? Yes  Pre-existing out of facility DNR order (yellow form or pink MOST form) Yellow form placed in chart (order not valid for inpatient use)     Chief Complaint  Patient presents with  . Medical Management of Chronic Issues    HPI:  Pt is a 83 y.o. female seen today for medical management of chronic diseases.     Past Medical History:  Diagnosis Date  . Alzheimer's disease 06/22/2015  . Anemia    iron deficient  . Anemia   . Anxiety   . Chronic airway obstruction, not elsewhere classified   . Chronic back pain   . Chronic cystitis   . Cystitis, chronic    Dr Gaynelle Arabian  . Degenerative arthritis   . Depression   .  Disturbance of skin sensation   . Disturbance of skin sensation   . Dyslipidemia   . Dyspnea    Dr Chase Caller  . Esophageal reflux   . Essential and other specified forms of tremor 12/11/2012  . Foot drop, bilateral 12/21/2013  . Gait disorder   . GERD (gastroesophageal reflux disease)   . Hemorrhoids   . Hereditary and idiopathic peripheral neuropathy 10/11/2015  . Hernia   . History of cerebrovascular disease    Moderate level small vessel disease  . Hyperlipidemia    mild  . Hypertension   . Osteoarthritis    right knee injection per Dr Trudie Reed  . Osteoporosis   . Other abnormal clinical finding   . Pain in joint, upper arm   . Peripheral vascular disease ([place])   . Rheumatoid arthritis ([place])    Dr Trudie Reed  . Tobacco use disorder   . Tremor    and gait disorder--Dr Jannifer Franklin  . Unspecified vitamin D deficiency    Past Surgical History:  Procedure Laterality Date  . APPENDECTOMY    . BACK SURGERY Bilateral    X2  . Bladder resuspension procedure    . CATARACT EXTRACTION Bilateral   . FEMUR IM NAIL Right 02/22/2014   Procedure: INTRAMEDULLARY (IM) NAIL FEMORAL;  Surgeon: Johnny Bridge, MD;  Location: [facility];  Service: Orthopedics;  Laterality: Right;  . INGUINAL HERNIA REPAIR Bilateral   . TONSILLECTOMY      Allergies  Allergen Reactions  . Novocain [Procaine]     Passed  out  .  Limbrel [Flavocoxid]     Dizziness   . Sertraline Anxiety    Outpatient Encounter Prescriptions as of 08/06/2016  Medication Sig  . acetaminophen (TYLENOL) 650 MG CR tablet Take 650 mg by mouth 2 (two) times daily.  . Calcium Carbonate-Vitamin D (CALTRATE 600+D) 600-400 MG-UNIT per tablet Take 1 tablet by mouth daily.  . Cholecalciferol (VITAMIN D3) 10000 units TABS Take 1 tablet by mouth daily.  . furosemide (LASIX) 20 MG tablet Take 20 mg by mouth. Take one tablet daily  . hydrocortisone cream 1 % Apply 1 application topically. Apply to rectal area twice daily as needed  . Hyoscyamine  Sulfate SL (LEVSIN/SL) 0.125 MG SUBL Place under the tongue. Take one twice daily  . memantine (NAMENDA) 10 MG tablet Take 10 mg by mouth 2 (two) times daily.  . Multiple Vitamin (MULTIVITAMIN) tablet Take 1 tablet by mouth daily.  . Nutritional Supplements (RESOURCE 2.0) LIQD Take by mouth. 2ml twice daily  . nystatin cream (MYCOSTATIN) Apply 1 application topically. Apply to affected areas 2 times a day as needed  . ranitidine (ZANTAC) 150 MG tablet Take 150 mg by mouth 2 (two) times daily.  . sennosides-docusate sodium (SENOKOT-S) 8.6-50 MG tablet Take 2 tablets by mouth daily.  [place] Kitchen zinc oxide 20 % ointment Apply 1 application topically. Apply to perineum and buttocks as needed for redness and iritation  . [DISCONTINUED] Glucosamine HCl 750 MG TABS Take by mouth. Take one tablet once a day  . [DISCONTINUED] omeprazole (PRILOSEC) 20 MG capsule Take 20 mg by mouth 2 (two) times daily before a meal.    No facility-administered encounter medications on file as of 08/06/2016.     Review of Systems  Immunization History  Administered Date(s) Administered  . Influenza Whole 01/16/2010  . Influenza-Unspecified 12/22/2014, 12/28/2015  . Pneumococcal Polysaccharide-23 01/21/2006  . Pneumococcal-Unspecified 12/19/1998  . Td 03/18/1998   Pertinent  Health Maintenance Due  Topic Date Due  . PNA vac Low Risk Adult (2 of 2 - PCV13) 01/22/2007  . INFLUENZA VACCINE  10/16/2016  . DEXA SCAN  Completed   Fall Risk  06/20/2015  Falls in the past year? No  Risk for fall due to : Impaired balance/gait   Functional Status Survey:    Vitals:   08/06/16 1541  BP: 127/90  Pulse: 78  Resp: 18  Temp: 99.1 F (37.3 C)  SpO2: 98%  Weight: 126 lb 12.8 oz (57.5 kg)  Height: 5\' 1"  (1.549 m)   Body mass index is 23.96 kg/m. Physical Exam  Labs reviewed:  Recent Labs  11/17/15 05/16/16  NA 141 144  K 4.0 3.9  BUN 17 18  CREATININE 0.8 0.9    Recent Labs  11/17/15 05/16/16  AST 17 16  ALT  11 7  ALKPHOS 71 70    Recent Labs  11/17/15 05/16/16  WBC 9.3 6.9  HGB 14.0 14.3  HCT 42 44  PLT 293 260   Lab Results  Component Value Date   TSH 1.49 05/16/2016   No results found for: HGBA1C No results found for: CHOL, HDL, LDLCALC, LDLDIRECT, TRIG, CHOLHDL  Significant Diagnostic Results in last 30 days:  No results found.  Assessment/Plan 1. Constipation, unspecified constipation type   2. Gastroesophageal reflux disease without esophagitis   3. Benign essential tremor   4. Late onset Alzheimer's disease without behavioral disturbance   5. Hereditary and idiopathic peripheral neuropathy   6. Edema, unspecified type   7. Current mild episode of major  depressive disorder without prior episode [facility])     Family/ staff Communication:   Labs/tests ordered:

## 2016-08-06 NOTE — Assessment & Plan Note (Signed)
Hx of depression, stable, off Celexa 10mg 

## 2016-08-06 NOTE — Assessment & Plan Note (Signed)
Not disabling.   

## 2016-08-08 DIAGNOSIS — F320 Major depressive disorder, single episode, mild: Secondary | ICD-10-CM | POA: Diagnosis not present

## 2016-08-08 DIAGNOSIS — M6281 Muscle weakness (generalized): Secondary | ICD-10-CM | POA: Diagnosis not present

## 2016-08-08 DIAGNOSIS — R278 Other lack of coordination: Secondary | ICD-10-CM | POA: Diagnosis not present

## 2016-08-08 DIAGNOSIS — M1990 Unspecified osteoarthritis, unspecified site: Secondary | ICD-10-CM | POA: Diagnosis not present

## 2016-08-08 DIAGNOSIS — R262 Difficulty in walking, not elsewhere classified: Secondary | ICD-10-CM | POA: Diagnosis not present

## 2016-08-08 DIAGNOSIS — Z9181 History of falling: Secondary | ICD-10-CM | POA: Diagnosis not present

## 2016-08-09 DIAGNOSIS — Z9181 History of falling: Secondary | ICD-10-CM | POA: Diagnosis not present

## 2016-08-09 DIAGNOSIS — M6281 Muscle weakness (generalized): Secondary | ICD-10-CM | POA: Diagnosis not present

## 2016-08-09 DIAGNOSIS — R278 Other lack of coordination: Secondary | ICD-10-CM | POA: Diagnosis not present

## 2016-08-09 DIAGNOSIS — R262 Difficulty in walking, not elsewhere classified: Secondary | ICD-10-CM | POA: Diagnosis not present

## 2016-08-09 DIAGNOSIS — F320 Major depressive disorder, single episode, mild: Secondary | ICD-10-CM | POA: Diagnosis not present

## 2016-08-09 DIAGNOSIS — M1990 Unspecified osteoarthritis, unspecified site: Secondary | ICD-10-CM | POA: Diagnosis not present

## 2016-08-12 DIAGNOSIS — R278 Other lack of coordination: Secondary | ICD-10-CM | POA: Diagnosis not present

## 2016-08-12 DIAGNOSIS — M6281 Muscle weakness (generalized): Secondary | ICD-10-CM | POA: Diagnosis not present

## 2016-08-12 DIAGNOSIS — R262 Difficulty in walking, not elsewhere classified: Secondary | ICD-10-CM | POA: Diagnosis not present

## 2016-08-12 DIAGNOSIS — Z9181 History of falling: Secondary | ICD-10-CM | POA: Diagnosis not present

## 2016-08-12 DIAGNOSIS — M1990 Unspecified osteoarthritis, unspecified site: Secondary | ICD-10-CM | POA: Diagnosis not present

## 2016-08-12 DIAGNOSIS — F320 Major depressive disorder, single episode, mild: Secondary | ICD-10-CM | POA: Diagnosis not present

## 2016-08-13 DIAGNOSIS — R278 Other lack of coordination: Secondary | ICD-10-CM | POA: Diagnosis not present

## 2016-08-13 DIAGNOSIS — Z9181 History of falling: Secondary | ICD-10-CM | POA: Diagnosis not present

## 2016-08-13 DIAGNOSIS — M6281 Muscle weakness (generalized): Secondary | ICD-10-CM | POA: Diagnosis not present

## 2016-08-13 DIAGNOSIS — M1990 Unspecified osteoarthritis, unspecified site: Secondary | ICD-10-CM | POA: Diagnosis not present

## 2016-08-13 DIAGNOSIS — F320 Major depressive disorder, single episode, mild: Secondary | ICD-10-CM | POA: Diagnosis not present

## 2016-08-13 DIAGNOSIS — R262 Difficulty in walking, not elsewhere classified: Secondary | ICD-10-CM | POA: Diagnosis not present

## 2016-08-14 DIAGNOSIS — M6281 Muscle weakness (generalized): Secondary | ICD-10-CM | POA: Diagnosis not present

## 2016-08-14 DIAGNOSIS — F320 Major depressive disorder, single episode, mild: Secondary | ICD-10-CM | POA: Diagnosis not present

## 2016-08-14 DIAGNOSIS — M1990 Unspecified osteoarthritis, unspecified site: Secondary | ICD-10-CM | POA: Diagnosis not present

## 2016-08-14 DIAGNOSIS — Z9181 History of falling: Secondary | ICD-10-CM | POA: Diagnosis not present

## 2016-08-14 DIAGNOSIS — R278 Other lack of coordination: Secondary | ICD-10-CM | POA: Diagnosis not present

## 2016-08-14 DIAGNOSIS — R262 Difficulty in walking, not elsewhere classified: Secondary | ICD-10-CM | POA: Diagnosis not present

## 2016-08-15 DIAGNOSIS — R262 Difficulty in walking, not elsewhere classified: Secondary | ICD-10-CM | POA: Diagnosis not present

## 2016-08-15 DIAGNOSIS — Z9181 History of falling: Secondary | ICD-10-CM | POA: Diagnosis not present

## 2016-08-15 DIAGNOSIS — M6281 Muscle weakness (generalized): Secondary | ICD-10-CM | POA: Diagnosis not present

## 2016-08-15 DIAGNOSIS — M1990 Unspecified osteoarthritis, unspecified site: Secondary | ICD-10-CM | POA: Diagnosis not present

## 2016-08-15 DIAGNOSIS — R278 Other lack of coordination: Secondary | ICD-10-CM | POA: Diagnosis not present

## 2016-08-15 DIAGNOSIS — F320 Major depressive disorder, single episode, mild: Secondary | ICD-10-CM | POA: Diagnosis not present

## 2016-08-16 DIAGNOSIS — M6281 Muscle weakness (generalized): Secondary | ICD-10-CM | POA: Diagnosis not present

## 2016-08-16 DIAGNOSIS — R278 Other lack of coordination: Secondary | ICD-10-CM | POA: Diagnosis not present

## 2016-08-16 DIAGNOSIS — F320 Major depressive disorder, single episode, mild: Secondary | ICD-10-CM | POA: Diagnosis not present

## 2016-08-16 DIAGNOSIS — D649 Anemia, unspecified: Secondary | ICD-10-CM | POA: Diagnosis not present

## 2016-08-16 DIAGNOSIS — R0600 Dyspnea, unspecified: Secondary | ICD-10-CM | POA: Diagnosis not present

## 2016-08-16 DIAGNOSIS — Z4889 Encounter for other specified surgical aftercare: Secondary | ICD-10-CM | POA: Diagnosis not present

## 2016-08-16 DIAGNOSIS — R4701 Aphasia: Secondary | ICD-10-CM | POA: Diagnosis not present

## 2016-08-16 DIAGNOSIS — M25551 Pain in right hip: Secondary | ICD-10-CM | POA: Diagnosis not present

## 2016-08-16 DIAGNOSIS — M1990 Unspecified osteoarthritis, unspecified site: Secondary | ICD-10-CM | POA: Diagnosis not present

## 2016-08-16 DIAGNOSIS — R270 Ataxia, unspecified: Secondary | ICD-10-CM | POA: Diagnosis not present

## 2016-08-16 DIAGNOSIS — Z9181 History of falling: Secondary | ICD-10-CM | POA: Diagnosis not present

## 2016-08-16 DIAGNOSIS — J984 Other disorders of lung: Secondary | ICD-10-CM | POA: Diagnosis not present

## 2016-08-16 DIAGNOSIS — K219 Gastro-esophageal reflux disease without esophagitis: Secondary | ICD-10-CM | POA: Diagnosis not present

## 2016-08-16 DIAGNOSIS — R41841 Cognitive communication deficit: Secondary | ICD-10-CM | POA: Diagnosis not present

## 2016-08-16 DIAGNOSIS — S7291XA Unspecified fracture of right femur, initial encounter for closed fracture: Secondary | ICD-10-CM | POA: Diagnosis not present

## 2016-08-16 DIAGNOSIS — R2990 Unspecified symptoms and signs involving the nervous system: Secondary | ICD-10-CM | POA: Diagnosis not present

## 2016-08-16 DIAGNOSIS — N814 Uterovaginal prolapse, unspecified: Secondary | ICD-10-CM | POA: Diagnosis not present

## 2016-08-16 DIAGNOSIS — R279 Unspecified lack of coordination: Secondary | ICD-10-CM | POA: Diagnosis not present

## 2016-08-16 DIAGNOSIS — M5134 Other intervertebral disc degeneration, thoracic region: Secondary | ICD-10-CM | POA: Diagnosis not present

## 2016-08-16 DIAGNOSIS — M4800 Spinal stenosis, site unspecified: Secondary | ICD-10-CM | POA: Diagnosis not present

## 2016-08-16 DIAGNOSIS — R262 Difficulty in walking, not elsewhere classified: Secondary | ICD-10-CM | POA: Diagnosis not present

## 2016-08-19 DIAGNOSIS — R262 Difficulty in walking, not elsewhere classified: Secondary | ICD-10-CM | POA: Diagnosis not present

## 2016-08-19 DIAGNOSIS — M25551 Pain in right hip: Secondary | ICD-10-CM | POA: Diagnosis not present

## 2016-08-19 DIAGNOSIS — M1990 Unspecified osteoarthritis, unspecified site: Secondary | ICD-10-CM | POA: Diagnosis not present

## 2016-08-19 DIAGNOSIS — R4701 Aphasia: Secondary | ICD-10-CM | POA: Diagnosis not present

## 2016-08-19 DIAGNOSIS — R278 Other lack of coordination: Secondary | ICD-10-CM | POA: Diagnosis not present

## 2016-08-19 DIAGNOSIS — M6281 Muscle weakness (generalized): Secondary | ICD-10-CM | POA: Diagnosis not present

## 2016-08-20 DIAGNOSIS — M25551 Pain in right hip: Secondary | ICD-10-CM | POA: Diagnosis not present

## 2016-08-20 DIAGNOSIS — M6281 Muscle weakness (generalized): Secondary | ICD-10-CM | POA: Diagnosis not present

## 2016-08-20 DIAGNOSIS — M1990 Unspecified osteoarthritis, unspecified site: Secondary | ICD-10-CM | POA: Diagnosis not present

## 2016-08-20 DIAGNOSIS — R4701 Aphasia: Secondary | ICD-10-CM | POA: Diagnosis not present

## 2016-08-20 DIAGNOSIS — R278 Other lack of coordination: Secondary | ICD-10-CM | POA: Diagnosis not present

## 2016-08-20 DIAGNOSIS — R262 Difficulty in walking, not elsewhere classified: Secondary | ICD-10-CM | POA: Diagnosis not present

## 2016-08-22 DIAGNOSIS — M25551 Pain in right hip: Secondary | ICD-10-CM | POA: Diagnosis not present

## 2016-08-22 DIAGNOSIS — M6281 Muscle weakness (generalized): Secondary | ICD-10-CM | POA: Diagnosis not present

## 2016-08-22 DIAGNOSIS — R278 Other lack of coordination: Secondary | ICD-10-CM | POA: Diagnosis not present

## 2016-08-22 DIAGNOSIS — R4701 Aphasia: Secondary | ICD-10-CM | POA: Diagnosis not present

## 2016-08-22 DIAGNOSIS — R262 Difficulty in walking, not elsewhere classified: Secondary | ICD-10-CM | POA: Diagnosis not present

## 2016-08-22 DIAGNOSIS — M1990 Unspecified osteoarthritis, unspecified site: Secondary | ICD-10-CM | POA: Diagnosis not present

## 2016-08-23 DIAGNOSIS — R262 Difficulty in walking, not elsewhere classified: Secondary | ICD-10-CM | POA: Diagnosis not present

## 2016-08-23 DIAGNOSIS — M6281 Muscle weakness (generalized): Secondary | ICD-10-CM | POA: Diagnosis not present

## 2016-08-23 DIAGNOSIS — R278 Other lack of coordination: Secondary | ICD-10-CM | POA: Diagnosis not present

## 2016-08-23 DIAGNOSIS — M25551 Pain in right hip: Secondary | ICD-10-CM | POA: Diagnosis not present

## 2016-08-23 DIAGNOSIS — R4701 Aphasia: Secondary | ICD-10-CM | POA: Diagnosis not present

## 2016-08-23 DIAGNOSIS — M1990 Unspecified osteoarthritis, unspecified site: Secondary | ICD-10-CM | POA: Diagnosis not present

## 2016-08-26 DIAGNOSIS — M6281 Muscle weakness (generalized): Secondary | ICD-10-CM | POA: Diagnosis not present

## 2016-08-26 DIAGNOSIS — M1990 Unspecified osteoarthritis, unspecified site: Secondary | ICD-10-CM | POA: Diagnosis not present

## 2016-08-26 DIAGNOSIS — M25551 Pain in right hip: Secondary | ICD-10-CM | POA: Diagnosis not present

## 2016-08-26 DIAGNOSIS — R278 Other lack of coordination: Secondary | ICD-10-CM | POA: Diagnosis not present

## 2016-08-26 DIAGNOSIS — R262 Difficulty in walking, not elsewhere classified: Secondary | ICD-10-CM | POA: Diagnosis not present

## 2016-08-26 DIAGNOSIS — R4701 Aphasia: Secondary | ICD-10-CM | POA: Diagnosis not present

## 2016-08-27 DIAGNOSIS — R278 Other lack of coordination: Secondary | ICD-10-CM | POA: Diagnosis not present

## 2016-08-27 DIAGNOSIS — M6281 Muscle weakness (generalized): Secondary | ICD-10-CM | POA: Diagnosis not present

## 2016-08-27 DIAGNOSIS — R4701 Aphasia: Secondary | ICD-10-CM | POA: Diagnosis not present

## 2016-08-27 DIAGNOSIS — M25551 Pain in right hip: Secondary | ICD-10-CM | POA: Diagnosis not present

## 2016-08-27 DIAGNOSIS — R262 Difficulty in walking, not elsewhere classified: Secondary | ICD-10-CM | POA: Diagnosis not present

## 2016-08-27 DIAGNOSIS — M1990 Unspecified osteoarthritis, unspecified site: Secondary | ICD-10-CM | POA: Diagnosis not present

## 2016-08-28 DIAGNOSIS — M25551 Pain in right hip: Secondary | ICD-10-CM | POA: Diagnosis not present

## 2016-08-28 DIAGNOSIS — M6281 Muscle weakness (generalized): Secondary | ICD-10-CM | POA: Diagnosis not present

## 2016-08-28 DIAGNOSIS — D0462 Carcinoma in situ of skin of left upper limb, including shoulder: Secondary | ICD-10-CM | POA: Diagnosis not present

## 2016-08-28 DIAGNOSIS — R262 Difficulty in walking, not elsewhere classified: Secondary | ICD-10-CM | POA: Diagnosis not present

## 2016-08-28 DIAGNOSIS — R4701 Aphasia: Secondary | ICD-10-CM | POA: Diagnosis not present

## 2016-08-28 DIAGNOSIS — M1990 Unspecified osteoarthritis, unspecified site: Secondary | ICD-10-CM | POA: Diagnosis not present

## 2016-08-28 DIAGNOSIS — R278 Other lack of coordination: Secondary | ICD-10-CM | POA: Diagnosis not present

## 2016-08-28 DIAGNOSIS — C44622 Squamous cell carcinoma of skin of right upper limb, including shoulder: Secondary | ICD-10-CM | POA: Diagnosis not present

## 2016-08-28 DIAGNOSIS — D485 Neoplasm of uncertain behavior of skin: Secondary | ICD-10-CM | POA: Diagnosis not present

## 2016-08-29 DIAGNOSIS — R278 Other lack of coordination: Secondary | ICD-10-CM | POA: Diagnosis not present

## 2016-08-29 DIAGNOSIS — R262 Difficulty in walking, not elsewhere classified: Secondary | ICD-10-CM | POA: Diagnosis not present

## 2016-08-29 DIAGNOSIS — M1990 Unspecified osteoarthritis, unspecified site: Secondary | ICD-10-CM | POA: Diagnosis not present

## 2016-08-29 DIAGNOSIS — M6281 Muscle weakness (generalized): Secondary | ICD-10-CM | POA: Diagnosis not present

## 2016-08-29 DIAGNOSIS — R4701 Aphasia: Secondary | ICD-10-CM | POA: Diagnosis not present

## 2016-08-29 DIAGNOSIS — M25551 Pain in right hip: Secondary | ICD-10-CM | POA: Diagnosis not present

## 2016-09-02 DIAGNOSIS — M25551 Pain in right hip: Secondary | ICD-10-CM | POA: Diagnosis not present

## 2016-09-02 DIAGNOSIS — R262 Difficulty in walking, not elsewhere classified: Secondary | ICD-10-CM | POA: Diagnosis not present

## 2016-09-02 DIAGNOSIS — R4701 Aphasia: Secondary | ICD-10-CM | POA: Diagnosis not present

## 2016-09-02 DIAGNOSIS — M6281 Muscle weakness (generalized): Secondary | ICD-10-CM | POA: Diagnosis not present

## 2016-09-02 DIAGNOSIS — R278 Other lack of coordination: Secondary | ICD-10-CM | POA: Diagnosis not present

## 2016-09-02 DIAGNOSIS — M1990 Unspecified osteoarthritis, unspecified site: Secondary | ICD-10-CM | POA: Diagnosis not present

## 2016-09-03 DIAGNOSIS — M6281 Muscle weakness (generalized): Secondary | ICD-10-CM | POA: Diagnosis not present

## 2016-09-03 DIAGNOSIS — R4701 Aphasia: Secondary | ICD-10-CM | POA: Diagnosis not present

## 2016-09-03 DIAGNOSIS — R278 Other lack of coordination: Secondary | ICD-10-CM | POA: Diagnosis not present

## 2016-09-03 DIAGNOSIS — M25551 Pain in right hip: Secondary | ICD-10-CM | POA: Diagnosis not present

## 2016-09-03 DIAGNOSIS — M1990 Unspecified osteoarthritis, unspecified site: Secondary | ICD-10-CM | POA: Diagnosis not present

## 2016-09-03 DIAGNOSIS — R262 Difficulty in walking, not elsewhere classified: Secondary | ICD-10-CM | POA: Diagnosis not present

## 2016-09-05 DIAGNOSIS — R4701 Aphasia: Secondary | ICD-10-CM | POA: Diagnosis not present

## 2016-09-05 DIAGNOSIS — R262 Difficulty in walking, not elsewhere classified: Secondary | ICD-10-CM | POA: Diagnosis not present

## 2016-09-05 DIAGNOSIS — M25551 Pain in right hip: Secondary | ICD-10-CM | POA: Diagnosis not present

## 2016-09-05 DIAGNOSIS — R278 Other lack of coordination: Secondary | ICD-10-CM | POA: Diagnosis not present

## 2016-09-05 DIAGNOSIS — M1990 Unspecified osteoarthritis, unspecified site: Secondary | ICD-10-CM | POA: Diagnosis not present

## 2016-09-05 DIAGNOSIS — M6281 Muscle weakness (generalized): Secondary | ICD-10-CM | POA: Diagnosis not present

## 2016-09-09 DIAGNOSIS — M25551 Pain in right hip: Secondary | ICD-10-CM | POA: Diagnosis not present

## 2016-09-09 DIAGNOSIS — M1990 Unspecified osteoarthritis, unspecified site: Secondary | ICD-10-CM | POA: Diagnosis not present

## 2016-09-09 DIAGNOSIS — R262 Difficulty in walking, not elsewhere classified: Secondary | ICD-10-CM | POA: Diagnosis not present

## 2016-09-09 DIAGNOSIS — M6281 Muscle weakness (generalized): Secondary | ICD-10-CM | POA: Diagnosis not present

## 2016-09-09 DIAGNOSIS — R4701 Aphasia: Secondary | ICD-10-CM | POA: Diagnosis not present

## 2016-09-09 DIAGNOSIS — R278 Other lack of coordination: Secondary | ICD-10-CM | POA: Diagnosis not present

## 2016-09-11 DIAGNOSIS — M6281 Muscle weakness (generalized): Secondary | ICD-10-CM | POA: Diagnosis not present

## 2016-09-11 DIAGNOSIS — M1990 Unspecified osteoarthritis, unspecified site: Secondary | ICD-10-CM | POA: Diagnosis not present

## 2016-09-11 DIAGNOSIS — R278 Other lack of coordination: Secondary | ICD-10-CM | POA: Diagnosis not present

## 2016-09-11 DIAGNOSIS — R262 Difficulty in walking, not elsewhere classified: Secondary | ICD-10-CM | POA: Diagnosis not present

## 2016-09-11 DIAGNOSIS — M25551 Pain in right hip: Secondary | ICD-10-CM | POA: Diagnosis not present

## 2016-09-11 DIAGNOSIS — R4701 Aphasia: Secondary | ICD-10-CM | POA: Diagnosis not present

## 2016-09-13 DIAGNOSIS — R278 Other lack of coordination: Secondary | ICD-10-CM | POA: Diagnosis not present

## 2016-09-13 DIAGNOSIS — M1990 Unspecified osteoarthritis, unspecified site: Secondary | ICD-10-CM | POA: Diagnosis not present

## 2016-09-13 DIAGNOSIS — R4701 Aphasia: Secondary | ICD-10-CM | POA: Diagnosis not present

## 2016-09-13 DIAGNOSIS — R262 Difficulty in walking, not elsewhere classified: Secondary | ICD-10-CM | POA: Diagnosis not present

## 2016-09-13 DIAGNOSIS — M25551 Pain in right hip: Secondary | ICD-10-CM | POA: Diagnosis not present

## 2016-09-13 DIAGNOSIS — M6281 Muscle weakness (generalized): Secondary | ICD-10-CM | POA: Diagnosis not present

## 2016-09-16 ENCOUNTER — Encounter: Payer: Self-pay | Admitting: Internal Medicine

## 2016-09-16 ENCOUNTER — Non-Acute Institutional Stay: Payer: Medicare Other | Admitting: Internal Medicine

## 2016-09-16 DIAGNOSIS — R918 Other nonspecific abnormal finding of lung field: Secondary | ICD-10-CM | POA: Diagnosis not present

## 2016-09-16 DIAGNOSIS — L03119 Cellulitis of unspecified part of limb: Secondary | ICD-10-CM | POA: Diagnosis not present

## 2016-09-16 DIAGNOSIS — R262 Difficulty in walking, not elsewhere classified: Secondary | ICD-10-CM | POA: Diagnosis not present

## 2016-09-16 DIAGNOSIS — R0600 Dyspnea, unspecified: Secondary | ICD-10-CM | POA: Diagnosis not present

## 2016-09-16 DIAGNOSIS — R278 Other lack of coordination: Secondary | ICD-10-CM | POA: Diagnosis not present

## 2016-09-16 DIAGNOSIS — R6889 Other general symptoms and signs: Secondary | ICD-10-CM | POA: Diagnosis not present

## 2016-09-16 DIAGNOSIS — Z9181 History of falling: Secondary | ICD-10-CM | POA: Diagnosis not present

## 2016-09-16 DIAGNOSIS — J984 Other disorders of lung: Secondary | ICD-10-CM | POA: Diagnosis not present

## 2016-09-16 DIAGNOSIS — N814 Uterovaginal prolapse, unspecified: Secondary | ICD-10-CM | POA: Diagnosis not present

## 2016-09-16 DIAGNOSIS — M6281 Muscle weakness (generalized): Secondary | ICD-10-CM | POA: Diagnosis not present

## 2016-09-16 DIAGNOSIS — Z4889 Encounter for other specified surgical aftercare: Secondary | ICD-10-CM | POA: Diagnosis not present

## 2016-09-16 DIAGNOSIS — S7291XA Unspecified fracture of right femur, initial encounter for closed fracture: Secondary | ICD-10-CM | POA: Diagnosis not present

## 2016-09-16 DIAGNOSIS — R279 Unspecified lack of coordination: Secondary | ICD-10-CM | POA: Diagnosis not present

## 2016-09-16 DIAGNOSIS — R2990 Unspecified symptoms and signs involving the nervous system: Secondary | ICD-10-CM | POA: Diagnosis not present

## 2016-09-16 DIAGNOSIS — K219 Gastro-esophageal reflux disease without esophagitis: Secondary | ICD-10-CM | POA: Diagnosis not present

## 2016-09-16 DIAGNOSIS — D649 Anemia, unspecified: Secondary | ICD-10-CM | POA: Diagnosis not present

## 2016-09-16 DIAGNOSIS — I878 Other specified disorders of veins: Secondary | ICD-10-CM | POA: Diagnosis not present

## 2016-09-16 DIAGNOSIS — F320 Major depressive disorder, single episode, mild: Secondary | ICD-10-CM | POA: Diagnosis not present

## 2016-09-16 DIAGNOSIS — R41841 Cognitive communication deficit: Secondary | ICD-10-CM | POA: Diagnosis not present

## 2016-09-16 DIAGNOSIS — R270 Ataxia, unspecified: Secondary | ICD-10-CM | POA: Diagnosis not present

## 2016-09-16 DIAGNOSIS — M1990 Unspecified osteoarthritis, unspecified site: Secondary | ICD-10-CM | POA: Diagnosis not present

## 2016-09-16 DIAGNOSIS — M5134 Other intervertebral disc degeneration, thoracic region: Secondary | ICD-10-CM | POA: Diagnosis not present

## 2016-09-16 DIAGNOSIS — M4800 Spinal stenosis, site unspecified: Secondary | ICD-10-CM | POA: Diagnosis not present

## 2016-09-16 NOTE — Progress Notes (Signed)
Location:  [facility] Room Number: 18-AL Place of Service:  ALF ([phone]) Provider:  Blanchie Serve, MD  Blanchie Serve, MD  Patient Care Team: Blanchie Serve, MD as PCP - General (Internal Medicine) Jettie Booze, MD as Consulting Physician (Cardiology) Marchia Bond, MD as Consulting Physician (Orthopedic Surgery) Kathrynn Ducking, MD as Consulting Physician (Neurology) Brand Males, MD as Consulting Physician (Pulmonary Disease) Mast, Man X, NP as Nurse Practitioner (Internal Medicine)  Extended Emergency Contact Information Primary Emergency Contact: [facility] Address: [address]          [place],  [address] [place] Phone: [phone] Mobile Phone: [phone] Relation: Son Secondary Emergency Contact: [place] Mobile Phone: [phone] Relation: Son  Code Status:  DNR Goals of care: Advanced Directive information Advanced Directives 08/06/2016  Does Patient Have a Medical Advance Directive? Yes  Type of Paramedic of [place];Living will;Out of facility DNR (pink MOST or yellow form)  Does patient want to make changes to medical advance directive? No - Patient declined  Copy of [place] in Chart? Yes  Pre-existing out of facility DNR order (yellow form or pink MOST form) Yellow form placed in chart (order not valid for inpatient use)     Chief Complaint  Patient presents with  . Acute Visit    Edema to LLE with discoloration    HPI:  Pt is a 83 y.o. female seen today for an acute visit for swelling to her legs with fluid draining x 2 days. This was noticed on 09/15/16. She has temperature of 100.6 today and had temperature of 100.9 yesterday. She is seen in her room today. She mentions not feeling good today. She feels tired. She ate her lunch.    Past Medical History:  Diagnosis Date  . Alzheimer's disease  06/22/2015  . Anemia    iron deficient  . Anemia   . Anxiety   . Chronic airway obstruction, not elsewhere classified   . Chronic back pain   . Chronic cystitis   . Cystitis, chronic    Dr Gaynelle Arabian  . Degenerative arthritis   . Depression   . Disturbance of skin sensation   . Disturbance of skin sensation   . Dyslipidemia   . Dyspnea    Dr Chase Caller  . Esophageal reflux   . Essential and other specified forms of tremor 12/11/2012  . Foot drop, bilateral 12/21/2013  . Gait disorder   . GERD (gastroesophageal reflux disease)   . Hemorrhoids   . Hereditary and idiopathic peripheral neuropathy 10/11/2015  . Hernia   . History of cerebrovascular disease    Moderate level small vessel disease  . Hyperlipidemia    mild  . Hypertension   . Osteoarthritis    right knee injection per Dr Trudie Reed  . Osteoporosis   . Other abnormal clinical finding   . Pain in joint, upper arm   . Peripheral vascular disease ([place])   . Rheumatoid arthritis ([place])    Dr Trudie Reed  . Tobacco use disorder   . Tremor    and gait disorder--Dr Jannifer Franklin  . Unspecified vitamin D deficiency    Past Surgical History:  Procedure Laterality Date  . APPENDECTOMY    . BACK SURGERY Bilateral    X2  . Bladder resuspension procedure    . CATARACT EXTRACTION Bilateral   . FEMUR IM NAIL Right 02/22/2014   Procedure: INTRAMEDULLARY (IM) NAIL FEMORAL;  Surgeon: Johnny Bridge, MD;  Location: [facility];  Service: Orthopedics;  Laterality: Right;  . INGUINAL HERNIA REPAIR Bilateral   . TONSILLECTOMY      Allergies  Allergen Reactions  . Novocain [Procaine]     Passed  out  . Limbrel [Flavocoxid]     Dizziness   . Sertraline Anxiety    Outpatient Encounter Prescriptions as of 09/16/2016  Medication Sig  . acetaminophen (TYLENOL) 650 MG CR tablet Take 650 mg by mouth 2 (two) times daily. Take q4h prn for fever  . Calcium Carbonate-Vitamin D (CALTRATE 600+D) 600-400 MG-UNIT per tablet Take 1 tablet by mouth daily.  .  Cholecalciferol (VITAMIN D3) 10000 units TABS Take 1 tablet by mouth daily.  . furosemide (LASIX) 20 MG tablet Take 20 mg by mouth daily.   . hydrocortisone cream 1 % Apply 1 application topically 2 (two) times daily as needed. Apply to rectal area  . Hyoscyamine Sulfate SL (LEVSIN/SL) 0.125 MG SUBL Place under the tongue every 12 (twelve) hours as needed.   . memantine (NAMENDA) 10 MG tablet Take 10 mg by mouth 2 (two) times daily.  . Misc Natural Products (OSTEO BI-FLEX TRIPLE STRENGTH) TABS Take 1 tablet by mouth daily.  . Multiple Vitamin (MULTIVITAMIN) tablet Take 1 tablet by mouth daily.  . Nutritional Supplements (RESOURCE 2.0) LIQD Take by mouth. 57ml twice daily  . nystatin cream (MYCOSTATIN) Apply 1 application topically. Apply to affected areas 2 times a day as needed  . ranitidine (ZANTAC) 150 MG tablet Take 150 mg by mouth 2 (two) times daily.  . sennosides-docusate sodium (SENOKOT-S) 8.6-50 MG tablet Take 2 tablets by mouth daily.  . Simethicone (MYLANTA GAS PO) Take 30 mLs by mouth every 4 (four) hours as needed.  . Skin Protectants, Misc. (NO-STING SKIN-PREP EX) Apply 1 application topically daily. Apply to the right 4th digit daily  . zinc oxide 20 % ointment Apply 1 application topically. Apply to perineum and buttocks as needed for redness and iritation   No facility-administered encounter medications on file as of 09/16/2016.     Review of Systems  Constitutional: Positive for fatigue and fever. Negative for appetite change and chills.  HENT: Negative for congestion, postnasal drip, rhinorrhea, sore throat and trouble swallowing.   Respiratory: Negative for cough, shortness of breath and wheezing.   Cardiovascular: Positive for leg swelling. Negative for chest pain and palpitations.  Gastrointestinal: Negative for abdominal pain, constipation, diarrhea, nausea and vomiting.  Genitourinary: Negative for dysuria.       Has urinary incontinence  Neurological: Negative for  dizziness.  Psychiatric/Behavioral: Positive for confusion.       Has dementia    Immunization History  Administered Date(s) Administered  . Influenza Whole 01/16/2010  . Influenza-Unspecified 12/22/2014, 12/28/2015  . Pneumococcal Polysaccharide-23 01/21/2006  . Pneumococcal-Unspecified 12/19/1998  . Td 03/18/1998   Pertinent  Health Maintenance Due  Topic Date Due  . PNA vac Low Risk Adult (2 of 2 - PCV13) 01/22/2007  . INFLUENZA VACCINE  10/16/2016  . DEXA SCAN  Completed   Fall Risk  06/20/2015  Falls in the past year? No  Risk for fall due to : Impaired balance/gait   Functional Status Survey:    Vitals:   09/16/16 1056  BP: 123/75  Pulse: 82  Resp: 18  Temp: 98.1 F (36.7 C)  TempSrc: Oral  SpO2: 98%  Weight: 126 lb 12.8 oz (57.5 kg)  Height: 5\' 1"  (1.549 m)   Body mass index is  23.96 kg/m. Physical Exam  Constitutional: She appears well-developed and well-nourished.  HENT:  Head: Normocephalic and atraumatic.  Mouth/Throat: Oropharynx is clear and moist.  Eyes: Conjunctivae and EOM are normal. Pupils are equal, round, and reactive to light.  Neck: Normal range of motion. Neck supple. No JVD present.  Cardiovascular: Normal rate and regular rhythm.   Murmur heard. Pulmonary/Chest: Effort normal. No respiratory distress.  Poor air movement bilaterally  Abdominal: Soft. Bowel sounds are normal. She exhibits no distension. There is no rebound and no guarding.  Musculoskeletal: She exhibits edema.  2+ leg edema with clear fluid draining at some spots. Can move all 4 extremities, on a recliner at present. Good dorsalis pedis. Feet and leg are warm to touch. No bluish discoloration noted.   Lymphadenopathy:    She has no cervical adenopathy.  Neurological: She is alert.  Oriented to person only  Skin: Skin is warm and dry.  Drainage to both leg- clear, erythema with few open skin area on legs  Psychiatric:  Pleasantly confused    Labs reviewed:  Recent  Labs  11/17/15 05/16/16  NA 141 144  K 4.0 3.9  BUN 17 18  CREATININE 0.8 0.9    Recent Labs  11/17/15 05/16/16  AST 17 16  ALT 11 7  ALKPHOS 71 70    Recent Labs  11/17/15 05/16/16  WBC 9.3 6.9  HGB 14.0 14.3  HCT 42 44  PLT 293 260   Lab Results  Component Value Date   TSH 1.49 05/16/2016   No results found for: HGBA1C No results found for: CHOL, HDL, LDLCALC, LDLDIRECT, TRIG, CHOLHDL  Significant Diagnostic Results in last 30 days:  No results found.  Assessment/Plan  Lower extremity cellulitis With increased erythema to her legs and low grade temperature, start her empirically on keflex 500 mg qid x 5 days. Add florastor to prevent diarrhea. Tylenol as needed for fever.   Venous stasis With increased leg edema. Currently on lasix 20 mg daily, change this to lasix 20 mg bid x 5 days, then daily, check bmp and monitor weight 3 days a week x 2 weeks. Ace wrap dressing to help with edema until antibiotic is completed. To keep legs elevated when possible  Decreased air entry On lung exam and has increased leg edema and sounds congested during interview, will obtain chest xray to rule out pulmonary edema. No known diagnosis of chf on chart review. Denies URI symptom. Monitor clinically.     Family/ staff Communication: reviewed care plan with patient and charge nurse.    Labs/tests ordered:  Chest xray, bmp   Blanchie Serve, MD Internal Medicine [facility] [address] [place], [place] [address] Cell Phone (Monday-Friday 8 am - 5 pm): [phone] On Call: [phone] and follow prompts after 5 pm and on weekends Office Phone: [phone] Office Fax: [phone]

## 2016-09-17 ENCOUNTER — Non-Acute Institutional Stay: Payer: Medicare Other | Admitting: Family

## 2016-09-17 ENCOUNTER — Encounter: Payer: Self-pay | Admitting: Family

## 2016-09-17 DIAGNOSIS — R262 Difficulty in walking, not elsewhere classified: Secondary | ICD-10-CM | POA: Diagnosis not present

## 2016-09-17 DIAGNOSIS — F320 Major depressive disorder, single episode, mild: Secondary | ICD-10-CM | POA: Diagnosis not present

## 2016-09-17 DIAGNOSIS — Z9181 History of falling: Secondary | ICD-10-CM | POA: Diagnosis not present

## 2016-09-17 DIAGNOSIS — R410 Disorientation, unspecified: Secondary | ICD-10-CM | POA: Diagnosis not present

## 2016-09-17 DIAGNOSIS — R509 Fever, unspecified: Secondary | ICD-10-CM | POA: Diagnosis not present

## 2016-09-17 DIAGNOSIS — M1990 Unspecified osteoarthritis, unspecified site: Secondary | ICD-10-CM | POA: Diagnosis not present

## 2016-09-17 DIAGNOSIS — N390 Urinary tract infection, site not specified: Secondary | ICD-10-CM | POA: Diagnosis not present

## 2016-09-17 DIAGNOSIS — K449 Diaphragmatic hernia without obstruction or gangrene: Secondary | ICD-10-CM | POA: Diagnosis not present

## 2016-09-17 DIAGNOSIS — M6281 Muscle weakness (generalized): Secondary | ICD-10-CM | POA: Diagnosis not present

## 2016-09-17 DIAGNOSIS — R278 Other lack of coordination: Secondary | ICD-10-CM | POA: Diagnosis not present

## 2016-09-17 NOTE — Progress Notes (Signed)
Location:  [facility] Room Number: 18 Place of Service:  ALF [phone]) Provider: Dinah Ngetich FNP-C  Blanchie Serve, MD  Patient Care Team: Blanchie Serve, MD as PCP - General (Internal Medicine) Jettie Booze, MD as Consulting Physician (Cardiology) Marchia Bond, MD as Consulting Physician (Orthopedic Surgery) Kathrynn Ducking, MD as Consulting Physician (Neurology) Brand Males, MD as Consulting Physician (Pulmonary Disease) Ngetich, Nelda Bucks, NP as Nurse Practitioner (Family Medicine)  Extended Emergency Contact Information Primary Emergency Contact: [facility] Address: [address] [place]          [place], [place] [address] [place] Phone: [phone] Mobile Phone: [phone] Relation: Son Secondary Emergency Contact: [place] Mobile Phone: [phone] Relation: Son  Code Status:  DNR  Goals of care: Advanced Directive information Advanced Directives 09/17/2016  Does Patient Have a Medical Advance Directive? Yes  Type of Advance Directive Out of facility DNR (pink MOST or yellow form);Living will;Healthcare Power of Attorney  Does patient want to make changes to medical advance directive? -  Copy of [place] in Chart? Yes  Pre-existing out of facility DNR order (yellow form or pink MOST form) -     Chief Complaint  Patient presents with  . Acute Visit    Anxiety; Confusion    HPI:  Pt is a 83 y.o. female seen today at [facility] for an acute visit for evaluation of increased confusion and severe anxiety. She is seen in her room today per facility nurse request. Facility Nurse reports has had worsening confusion and anxiety.Nurse also reports patient complains of abdominal discomfort and Mylanta was given with relief. She is currently on keflex started by MD 09/16/2016 for leg cellulitis.Her Temp 100.5 this visit. She denies any fever, chills, nausea,  vomiting or feeling anxious during the visit.      Past Medical History:  Diagnosis Date  . Alzheimer's disease 06/22/2015  . Anemia    iron deficient  . Anemia   . Anxiety   . Chronic airway obstruction, not elsewhere classified   . Chronic back pain   . Chronic cystitis   . Cystitis, chronic    Dr Gaynelle Arabian  . Degenerative arthritis   . Depression   . Disturbance of skin sensation   . Disturbance of skin sensation   . Dyslipidemia   . Dyspnea    Dr Chase Caller  . Esophageal reflux   . Essential and other specified forms of tremor 12/11/2012  . Foot drop, bilateral 12/21/2013  . Gait disorder   . GERD (gastroesophageal reflux disease)   . Hemorrhoids   . Hereditary and idiopathic peripheral neuropathy 10/11/2015  . Hernia   . History of cerebrovascular disease    Moderate level small vessel disease  . Hyperlipidemia    mild  . Hypertension   . Osteoarthritis    right knee injection per Dr Trudie Reed  . Osteoporosis   . Other abnormal clinical finding   . Pain in joint, upper arm   . Peripheral vascular disease ([place])   . Rheumatoid arthritis ([place])    Dr Trudie Reed  . Tobacco use disorder   . Tremor    and gait disorder--Dr Jannifer Franklin  . Unspecified vitamin D deficiency    Past Surgical History:  Procedure Laterality Date  . APPENDECTOMY    . BACK SURGERY Bilateral    X2  . Bladder resuspension procedure    . CATARACT EXTRACTION Bilateral   . FEMUR  IM NAIL Right 02/22/2014   Procedure: INTRAMEDULLARY (IM) NAIL FEMORAL;  Surgeon: Johnny Bridge, MD;  Location: [facility];  Service: Orthopedics;  Laterality: Right;  . INGUINAL HERNIA REPAIR Bilateral   . TONSILLECTOMY      Allergies  Allergen Reactions  . Novocain [Procaine]     Passed  out  . Limbrel [Flavocoxid]     Dizziness   . Sertraline Anxiety    Allergies as of 09/17/2016      Reactions   Novocain [procaine]    Passed  out   Limbrel [flavocoxid]    Dizziness   Sertraline Anxiety      Medication List         Accurate as of 09/17/16  1:00 PM. Always use your most recent med list.          acetaminophen 650 MG CR tablet Commonly known as:  TYLENOL Take 650 mg by mouth 2 (two) times daily. Take q4h prn for fever   CALTRATE 600+D 600-400 MG-UNIT tablet Generic drug:  Calcium Carbonate-Vitamin D Take 1 tablet by mouth daily.   furosemide 20 MG tablet Commonly known as:  LASIX Take 20 mg by mouth daily.   hydrocortisone cream 1 % Apply 1 application topically 2 (two) times daily as needed. Apply to rectal area   LEVSIN/SL 0.125 MG Subl Generic drug:  Hyoscyamine Sulfate SL Place under the tongue every 12 (twelve) hours as needed.   memantine 10 MG tablet Commonly known as:  NAMENDA Take 10 mg by mouth 2 (two) times daily.   multivitamin tablet Take 1 tablet by mouth daily.   NO-STING SKIN-PREP EX Apply 1 application topically daily. Apply to the right 4th digit daily   nystatin cream Commonly known as:  MYCOSTATIN Apply 1 application topically. Apply to affected areas 2 times a day as needed   OSTEO BI-FLEX TRIPLE STRENGTH Tabs Take 1 tablet by mouth daily.   ranitidine 150 MG tablet Commonly known as:  ZANTAC Take 150 mg by mouth 2 (two) times daily.   RESOURCE 2.0 Liqd Take by mouth. 56ml twice daily   sennosides-docusate sodium 8.6-50 MG tablet Commonly known as:  SENOKOT-S Take 2 tablets by mouth daily.   Vitamin D3 10000 units Tabs Take 1 tablet by mouth daily.   zinc oxide 20 % ointment Apply 1 application topically. Apply to perineum and buttocks as needed for redness and iritation       Review of Systems  Constitutional: Negative for activity change, appetite change, chills, fatigue and fever.  HENT: Negative for congestion, rhinorrhea, sinus pain, sinus pressure, sneezing and sore throat.   Eyes: Negative for pain, discharge, redness and itching.  Respiratory: Negative for cough, chest tightness, shortness of breath and wheezing.   Cardiovascular:  Positive for leg swelling. Negative for chest pain and palpitations.  Gastrointestinal: Negative for abdominal distention, abdominal pain, constipation, diarrhea, nausea and vomiting.  Genitourinary: Negative for dyspareunia, dysuria, flank pain, frequency and urgency.  Musculoskeletal: Positive for gait problem.  Skin: Negative for color change, pallor and rash.  Neurological: Negative for dizziness, syncope, light-headedness and headaches.  Psychiatric/Behavioral: Positive for confusion. Negative for agitation, hallucinations and sleep disturbance.       Anxiety per HPI     Immunization History  Administered Date(s) Administered  . Influenza Whole 01/16/2010  . Influenza-Unspecified 12/22/2014, 12/28/2015  . Pneumococcal Polysaccharide-23 01/21/2006  . Pneumococcal-Unspecified 12/19/1998  . Td 03/18/1998   Pertinent  Health Maintenance Due  Topic Date Due  . PNA  vac Low Risk Adult (2 of 2 - PCV13) 01/22/2007  . INFLUENZA VACCINE  10/16/2016  . DEXA SCAN  Completed   Fall Risk  06/20/2015  Falls in the past year? No  Risk for fall due to : Impaired balance/gait    Vitals:   09/17/16 0950  BP: 109/66  Pulse: 71  Resp: 18  Temp: (!) 100.5 F (38.1 C)  SpO2: 95%  Weight: 126 lb (57.2 kg)  Height: 5\' 1"  (1.549 m)   Body mass index is 23.81 kg/m. Physical Exam  Constitutional: She appears well-developed and well-nourished. No distress.  HENT:  Head: Normocephalic.  Mouth/Throat: Oropharynx is clear and moist. No oropharyngeal exudate.  Eyes: Conjunctivae and EOM are normal. Pupils are equal, round, and reactive to light. Right eye exhibits no discharge. Left eye exhibits no discharge. No scleral icterus.  Neck: Normal range of motion. No JVD present. No thyromegaly present.  Cardiovascular: Exam reveals no gallop and no friction rub.   Murmur heard. Pulmonary/Chest: Effort normal and breath sounds normal. No respiratory distress. She has no wheezes. She has no rales.    Abdominal: Soft. Bowel sounds are normal. She exhibits no distension. There is no tenderness. There is no rebound and no guarding.  Musculoskeletal: She exhibits no tenderness or deformity.  Moves x 4 extremities.Unsteady gait uses walker. Bilateral lower extremities edema knee high wraps in place.   Lymphadenopathy:    She has no cervical adenopathy.  Neurological: She is alert. Coordination normal.  Skin: Skin is warm and dry. No rash noted. No erythema. No pallor.  Bilateral knee high wrap dressing dry,clean and intact.   Psychiatric: She has a normal mood and affect.    Labs reviewed:  Recent Labs  11/17/15 05/16/16  NA 141 144  K 4.0 3.9  BUN 17 18  CREATININE 0.8 0.9    Recent Labs  11/17/15 05/16/16  AST 17 16  ALT 11 7  ALKPHOS 71 70    Recent Labs  11/17/15 05/16/16  WBC 9.3 6.9  HGB 14.0 14.3  HCT 42 44  PLT 293 260   Lab Results  Component Value Date   TSH 1.49 05/16/2016   Significant Diagnostic Results in last 30 days:  Chest X-ray done 09/16/2016 Impression: Negative pulmonary abnormality except hiatal hernia.   Assessment/Plan 1. Confusion Worsening confusion reported. Currently on oral Keflex for lower extremities cellulitis.Portable CXR results done 09/16/2016 negative. Will check urine specimen for U/A and C/S rule out UTI   2. Hiatal hernia Per CXR. Has had intermittent abdomen discomfort per nurse. Asymptomatic during visit. Mylanta effective. Continue on Ranitidine 150 mg Tablet twice daily.  3. Fever  Temp 100.5 today. Started Keflex 500 mg Tablet Qid x 5 days on 09/16/2016. CXR negative for acute abnormalities. Will check urine specimen as above.   Family/ staff Communication: Reviewed plan of care with patient and facility Nurse supervisor  Labs/tests ordered: urine specimen for U/A and C/S rule out UTI    Sandrea Hughs, NP

## 2016-09-18 ENCOUNTER — Inpatient Hospital Stay (HOSPITAL_COMMUNITY)
Admission: EM | Admit: 2016-09-18 | Discharge: 2016-09-21 | DRG: 494 | Disposition: A | Discharge status: Skilled Nursing Facility | Payer: Medicare Other | Attending: Orthopedic Surgery | Admitting: Orthopedic Surgery

## 2016-09-18 ENCOUNTER — Emergency Department (HOSPITAL_COMMUNITY): Payer: Medicare Other

## 2016-09-18 ENCOUNTER — Encounter (HOSPITAL_COMMUNITY): Payer: Self-pay | Admitting: Family Medicine

## 2016-09-18 DIAGNOSIS — S99912A Unspecified injury of left ankle, initial encounter: Secondary | ICD-10-CM | POA: Diagnosis not present

## 2016-09-18 DIAGNOSIS — Z833 Family history of diabetes mellitus: Secondary | ICD-10-CM

## 2016-09-18 DIAGNOSIS — R52 Pain, unspecified: Secondary | ICD-10-CM

## 2016-09-18 DIAGNOSIS — S82842B Displaced bimalleolar fracture of left lower leg, initial encounter for open fracture type I or II: Secondary | ICD-10-CM | POA: Diagnosis not present

## 2016-09-18 DIAGNOSIS — I10 Essential (primary) hypertension: Secondary | ICD-10-CM | POA: Diagnosis present

## 2016-09-18 DIAGNOSIS — Z8249 Family history of ischemic heart disease and other diseases of the circulatory system: Secondary | ICD-10-CM

## 2016-09-18 DIAGNOSIS — F0280 Dementia in other diseases classified elsewhere without behavioral disturbance: Secondary | ICD-10-CM | POA: Diagnosis not present

## 2016-09-18 DIAGNOSIS — T148XXA Other injury of unspecified body region, initial encounter: Secondary | ICD-10-CM

## 2016-09-18 DIAGNOSIS — S82892B Other fracture of left lower leg, initial encounter for open fracture type I or II: Secondary | ICD-10-CM | POA: Diagnosis present

## 2016-09-18 DIAGNOSIS — I739 Peripheral vascular disease, unspecified: Secondary | ICD-10-CM | POA: Diagnosis present

## 2016-09-18 DIAGNOSIS — J449 Chronic obstructive pulmonary disease, unspecified: Secondary | ICD-10-CM | POA: Diagnosis present

## 2016-09-18 DIAGNOSIS — Z419 Encounter for procedure for purposes other than remedying health state, unspecified: Secondary | ICD-10-CM

## 2016-09-18 DIAGNOSIS — G309 Alzheimer's disease, unspecified: Secondary | ICD-10-CM | POA: Diagnosis present

## 2016-09-18 DIAGNOSIS — M25572 Pain in left ankle and joints of left foot: Secondary | ICD-10-CM | POA: Diagnosis not present

## 2016-09-18 DIAGNOSIS — E785 Hyperlipidemia, unspecified: Secondary | ICD-10-CM | POA: Diagnosis not present

## 2016-09-18 DIAGNOSIS — M069 Rheumatoid arthritis, unspecified: Secondary | ICD-10-CM | POA: Diagnosis present

## 2016-09-18 DIAGNOSIS — M810 Age-related osteoporosis without current pathological fracture: Secondary | ICD-10-CM | POA: Diagnosis present

## 2016-09-18 DIAGNOSIS — K219 Gastro-esophageal reflux disease without esophagitis: Secondary | ICD-10-CM | POA: Diagnosis present

## 2016-09-18 DIAGNOSIS — Z23 Encounter for immunization: Secondary | ICD-10-CM

## 2016-09-18 DIAGNOSIS — S91002A Unspecified open wound, left ankle, initial encounter: Secondary | ICD-10-CM

## 2016-09-18 DIAGNOSIS — Y92122 Bedroom in nursing home as the place of occurrence of the external cause: Secondary | ICD-10-CM

## 2016-09-18 DIAGNOSIS — S9305XA Dislocation of left ankle joint, initial encounter: Secondary | ICD-10-CM | POA: Diagnosis not present

## 2016-09-18 DIAGNOSIS — W06XXXA Fall from bed, initial encounter: Secondary | ICD-10-CM | POA: Diagnosis present

## 2016-09-18 DIAGNOSIS — S9302XA Subluxation of left ankle joint, initial encounter: Secondary | ICD-10-CM | POA: Diagnosis not present

## 2016-09-18 LAB — CBC WITH DIFFERENTIAL/PLATELET
BASOS PCT: 0 %
Basophils Absolute: 0 10*3/uL (ref 0.0–0.1)
EOS ABS: 0.2 10*3/uL (ref 0.0–0.7)
Eosinophils Relative: 2 %
HCT: 39.5 % (ref 36.0–46.0)
Hemoglobin: 13 g/dL (ref 12.0–15.0)
Lymphocytes Relative: 15 %
Lymphs Abs: 1.8 10*3/uL (ref 0.7–4.0)
MCH: 32.8 pg (ref 26.0–34.0)
MCHC: 32.9 g/dL (ref 30.0–36.0)
MCV: 99.7 fL (ref 78.0–100.0)
MONO ABS: 1.5 10*3/uL — AB (ref 0.1–1.0)
Monocytes Relative: 12 %
Neutro Abs: 8.4 10*3/uL — ABNORMAL HIGH (ref 1.7–7.7)
Neutrophils Relative %: 71 %
Platelets: 358 10*3/uL (ref 150–400)
RBC: 3.96 MIL/uL (ref 3.87–5.11)
RDW: 14.1 % (ref 11.5–15.5)
WBC: 11.9 10*3/uL — ABNORMAL HIGH (ref 4.0–10.5)

## 2016-09-18 LAB — I-STAT CHEM 8, ED
BUN: 30 mg/dL — ABNORMAL HIGH (ref 6–20)
CREATININE: 1 mg/dL (ref 0.44–1.00)
Calcium, Ion: 1.14 mmol/L — ABNORMAL LOW (ref 1.15–1.40)
Chloride: 96 mmol/L — ABNORMAL LOW (ref 101–111)
Glucose, Bld: 110 mg/dL — ABNORMAL HIGH (ref 65–99)
HEMATOCRIT: 39 % (ref 36.0–46.0)
HEMOGLOBIN: 13.3 g/dL (ref 12.0–15.0)
Potassium: 4.5 mmol/L (ref 3.5–5.1)
SODIUM: 136 mmol/L (ref 135–145)
TCO2: 35 mmol/L (ref 0–100)

## 2016-09-18 LAB — BASIC METABOLIC PANEL
Anion gap: 5 (ref 5–15)
BUN: 24 mg/dL — ABNORMAL HIGH (ref 6–20)
CALCIUM: 9.4 mg/dL (ref 8.9–10.3)
CO2: 33 mmol/L — AB (ref 22–32)
CREATININE: 1.06 mg/dL — AB (ref 0.44–1.00)
Chloride: 99 mmol/L — ABNORMAL LOW (ref 101–111)
GFR calc Af Amer: 52 mL/min — ABNORMAL LOW (ref >60)
GFR calc non Af Amer: 45 mL/min — ABNORMAL LOW (ref >60)
GLUCOSE: 115 mg/dL — AB (ref 65–99)
Potassium: 4.6 mmol/L (ref 3.5–5.1)
Sodium: 137 mmol/L (ref 135–145)

## 2016-09-18 MED ORDER — CEFAZOLIN SODIUM-DEXTROSE 1-4 GM/50ML-% IV SOLN
1.0000 g | Freq: Once | INTRAVENOUS | Status: AC
  Administered 2016-09-18: 1 g via INTRAVENOUS
  Filled 2016-09-18: qty 50

## 2016-09-18 MED ORDER — TETANUS-DIPHTH-ACELL PERTUSSIS 5-2.5-18.5 LF-MCG/0.5 IM SUSP
0.5000 mL | Freq: Once | INTRAMUSCULAR | Status: AC
  Administered 2016-09-18: 0.5 mL via INTRAMUSCULAR
  Filled 2016-09-18: qty 0.5

## 2016-09-18 MED ORDER — ETOMIDATE 2 MG/ML IV SOLN
0.1000 mg/kg | Freq: Once | INTRAVENOUS | Status: AC
  Administered 2016-09-19: 5.72 mg via INTRAVENOUS
  Filled 2016-09-18: qty 10

## 2016-09-18 MED ORDER — MORPHINE SULFATE (PF) 2 MG/ML IV SOLN
4.0000 mg | Freq: Once | INTRAVENOUS | Status: AC
  Administered 2016-09-18: 4 mg via INTRAVENOUS
  Filled 2016-09-18: qty 2

## 2016-09-18 NOTE — ED Notes (Signed)
Bandage from left ankle was removed. An open fracture is noted to the medial left ankle. Ankle is rotated and doppler pulsed identified by Dr. Alfonso Patten. Little. Skin is warm and dry.

## 2016-09-18 NOTE — ED Notes (Signed)
Pt in CT.

## 2016-09-18 NOTE — ED Triage Notes (Signed)
Patient is from [facility] and transported via [facility]. EMS reports she had an unwitnessed fall and landed on her left side. Furthermore, she hit her head on the bed with no prescribed blood thinners. Per facility to EMS, patient is at her baseline for her dementia. Facility wrapped her left ankle and reported she had a laceration to EMS.

## 2016-09-18 NOTE — ED Notes (Signed)
DR Altamese Dilling REQUEST ORTHOPEDIC [facility] FOR PT #[id number] 

## 2016-09-18 NOTE — ED Notes (Signed)
Assisted patient with bed pan and changed incontinence pad in her panties. Pt tolerated it well.

## 2016-09-18 NOTE — ED Notes (Signed)
Radiology at bedside

## 2016-09-18 NOTE — ED Provider Notes (Signed)
[facility] DEPT Provider Note   CSN: [id number] Arrival date & time: 09/18/16  2133     History   Chief Complaint Chief Complaint  Patient presents with  . Fall    HPI [name] is a 83 y.o. female.  83 year old female with extensive past medical history including Alzheimer's dementia, rheumatoid arthritis, PVD who presents with ankle pain after a fall. Tonight the patient was trying to get out of bed and fell, injuring her left ankle. She reports constant, severe pain in her ankle. She states she did not hit her head but nursing facility reports that her fall was unwitnessed. They reported that she hit her head on the bed. She denies any other areas of pain.  LEVEL 5 CAVEAT DUE TO DEMENTIA   The history is provided by the EMS personnel, the nursing home and the patient.  Fall     Past Medical History:  Diagnosis Date  . Alzheimer's disease 06/22/2015  . Anemia    iron deficient  . Anemia   . Anxiety   . Chronic airway obstruction, not elsewhere classified   . Chronic back pain   . Chronic cystitis   . Cystitis, chronic    Dr Gaynelle Arabian  . Degenerative arthritis   . Depression   . Disturbance of skin sensation   . Disturbance of skin sensation   . Dyslipidemia   . Dyspnea    Dr Chase Caller  . Esophageal reflux   . Essential and other specified forms of tremor 12/11/2012  . Foot drop, bilateral 12/21/2013  . Gait disorder   . GERD (gastroesophageal reflux disease)   . Hemorrhoids   . Hereditary and idiopathic peripheral neuropathy 10/11/2015  . Hernia   . History of cerebrovascular disease    Moderate level small vessel disease  . Hyperlipidemia    mild  . Hypertension   . Osteoarthritis    right knee injection per Dr Trudie Reed  . Osteoporosis   . Other abnormal clinical finding   . Pain in joint, upper arm   . Peripheral vascular disease ([place])   . Rheumatoid arthritis ([place])    Dr Trudie Reed  . Tobacco use disorder   . Tremor    and gait disorder--Dr  Jannifer Franklin  . Unspecified vitamin D deficiency     Patient Active Problem List   Diagnosis Date Noted  . Internal hemorrhoids 11/16/2015  . Edema 11/16/2015  . Hereditary and idiopathic peripheral neuropathy 10/11/2015  . Alzheimer's disease 06/22/2015  . Osteopenia, senile 04/15/2014  . UTI (urinary tract infection) 04/01/2014  . Constipation 02/25/2014  . Small vessel disease, cerebrovascular 02/25/2014  . GERD (gastroesophageal reflux disease) 02/25/2014  . Heart murmur on physical examination 02/25/2014  . Fracture, subtrochanteric, right femur, closed ([place])   . Depression   . Gait disorder 12/21/2013  . Foot drop, bilateral 12/21/2013  . Benign essential tremor 12/11/2012  . HYPERTENSION, PULMONARY 03/30/2010    Past Surgical History:  Procedure Laterality Date  . APPENDECTOMY    . BACK SURGERY Bilateral    X2  . Bladder resuspension procedure    . CATARACT EXTRACTION Bilateral   . FEMUR IM NAIL Right 02/22/2014   Procedure: INTRAMEDULLARY (IM) NAIL FEMORAL;  Surgeon: Johnny Bridge, MD;  Location: [facility] [place] [facility];  Service: Orthopedics;  Laterality: Right;  . INGUINAL HERNIA REPAIR Bilateral   . TONSILLECTOMY      OB History    No data available       Home Medications    Prior  to Admission medications   Medication Sig Start Date End Date Taking? Authorizing Provider  acetaminophen (TYLENOL) 650 MG CR tablet Take 650 mg by mouth 2 (two) times daily. Take q4h prn for fever   Yes [provider]  alum & mag hydroxide-simeth (MAALOX/MYLANTA) 200-200-20 MG/5ML suspension Take 30 mLs by mouth every 4 (four) hours as needed for indigestion or heartburn.   Yes [provider]  Calcium Carbonate-Vitamin D (CALTRATE 600+D) 600-400 MG-UNIT per tablet Take 1 tablet by mouth daily.   Yes [provider]  cephALEXin (KEFLEX) 500 MG capsule Take 500 mg by mouth 4 (four) times daily.   Yes [provider]  Cholecalciferol (VITAMIN D3) 10000 units TABS  Take 1 tablet by mouth daily.   Yes [provider]  furosemide (LASIX) 20 MG tablet Take 20 mg by mouth 2 (two) times daily.    Yes [provider]  Hyoscyamine Sulfate SL (LEVSIN/SL) 0.125 MG SUBL Place under the tongue every 12 (twelve) hours as needed (for cramps).    Yes [provider]  memantine (NAMENDA) 10 MG tablet Take 10 mg by mouth 2 (two) times daily.   Yes [provider]  Misc Natural Products (OSTEO BI-FLEX TRIPLE STRENGTH) TABS Take 1 tablet by mouth daily.   Yes [provider]  Multiple Vitamin (MULTIVITAMIN) tablet Take 1 tablet by mouth daily.   Yes [provider]  Nutritional Supplements (RESOURCE 2.0) LIQD Take by mouth. 58ml twice daily   Yes [provider]  ranitidine (ZANTAC) 150 MG tablet Take 150 mg by mouth 2 (two) times daily.   Yes [provider]  saccharomyces boulardii (FLORASTOR) 250 MG capsule Take 250 mg by mouth 2 (two) times daily.   Yes [provider]  sennosides-docusate sodium (SENOKOT-S) 8.6-50 MG tablet Take 2 tablets by mouth daily. 02/22/14  Yes Marchia Bond, MD    Family History Family History  Problem Relation Age of Onset  . Heart Problems Mother   . Diabetes Father   . Heart attack Father   . Cancer Sister        Brain tumor  . Emphysema Brother     Social History Social History  Substance Use Topics  . Smoking status: Never Smoker  . Smokeless tobacco: Never Used  . Alcohol use No     Allergies   Novocain [procaine]; Limbrel [flavocoxid]; and Sertraline   Review of Systems Review of Systems  Unable to perform ROS: Dementia     Physical Exam Updated Vital Signs BP 137/78 (BP Location: Left Arm)   Pulse 97   Temp 97.7 F (36.5 C) (Oral)   Resp 20   SpO2 94%   Physical Exam  Constitutional: She appears well-developed and well-nourished.  Uncomfortable, mild distress due to pain  HENT:  Head: Normocephalic and atraumatic.  Eyes:  Conjunctivae are normal. Pupils are equal, round, and reactive to light.  Neck: Neck supple.  Cardiovascular: Normal rate, regular rhythm, normal heart sounds and intact distal pulses.   No murmur heard. Pulmonary/Chest: Effort normal and breath sounds normal.  Abdominal: Soft. Bowel sounds are normal. She exhibits no distension. There is no tenderness.  Musculoskeletal: She exhibits edema, tenderness and deformity.  L ankle dislocation with foot turned laterally; 3cm wound over medial malleolus; DP pulse dopplered  Neurological: She is alert. No sensory deficit.  Oriented to person and place, able to answer questions  Skin: Skin is warm and dry.  Nursing note and vitals reviewed.  ED Treatments / Results  Labs (all labs ordered are listed, but only abnormal results are displayed) Labs Reviewed  BASIC METABOLIC PANEL - Abnormal; Notable for the following:       Result Value   Chloride 99 (*)    CO2 33 (*)    Glucose, Bld 115 (*)    BUN 24 (*)    Creatinine, Ser 1.06 (*)    GFR calc non Af Amer 45 (*)    GFR calc Af Amer 52 (*)    All other components within normal limits  CBC WITH DIFFERENTIAL/PLATELET - Abnormal; Notable for the following:    WBC 11.9 (*)    Neutro Abs 8.4 (*)    Monocytes Absolute 1.5 (*)    All other components within normal limits  I-STAT CHEM 8, ED - Abnormal; Notable for the following:    Chloride 96 (*)    BUN 30 (*)    Glucose, Bld 110 (*)    Calcium, Ion 1.14 (*)    All other components within normal limits    EKG  EKG Interpretation None       Radiology Dg Chest Port 1 View  Result Date: 09/18/2016 CLINICAL DATA:  Fall at home with open ankle fracture. EXAM: PORTABLE CHEST 1 VIEW COMPARISON:  Radiograph 02/21/2014 FINDINGS: Low lung volumes limit assessment. Mild cardiomegaly. Normal mediastinal contours. No pulmonary edema. Possible blunting of left costophrenic angle versus soft tissue attenuation. No consolidation or pneumothorax.  Postsurgical change in the lower thoracic and lumbar spine, partially included. No acute osseous abnormalities are seen. The bones appear under mineralized. IMPRESSION: Low lung volumes with mild cardiomegaly. Possible small left pleural effusion. Electronically Signed   By: Jeb Levering M.D.   On: 09/18/2016 22:52   Dg Ankle Left Port  Result Date: 09/18/2016 CLINICAL DATA:  Fall at nursing home with ankle deformity. EXAM: PORTABLE LEFT ANKLE - 2 VIEW COMPARISON:  None. FINDINGS: Ankle fracture dislocation. The talus is displaced laterally with apex medial angulation with respect to the tibial plafond. Displaced transverse medial malleolar fracture with distal fragment remaining aligned with the talus. Oblique distal fibular fracture with angulation, distal fragment remains aligned with the talus. No definite posterior tibial tubercle fracture. IMPRESSION: Bimalleolar ankle fracture dislocation. Lateral displacement of the talus with fractures of the medial malleolus and distal fibula, distal fracture fragment remain aligned with the talus. No definite posterior tibial tubercle fracture is seen. Electronically Signed   By: Jeb Levering M.D.   On: 09/18/2016 22:56    Procedures Reduction of dislocation Date/Time: 09/19/2016 1:03 AM Performed by: Sharlett Iles Authorized by: Sharlett Iles  Consent: The procedure was performed in an emergent situation. Patient identity confirmed: verbally with patient Local anesthesia used: no  Anesthesia: Local anesthesia used: no  Sedation: Patient sedated: yes Sedatives: etomidate Vitals: Vital signs were monitored during sedation. Patient tolerance: Patient tolerated the procedure well with no immediate complications Comments: Unable to obtain full informed consent due to patient's underlying dementia and emergent situation. Gave etomidate and reduced left ankle using traction/countertraction. Strong pulse palpable after reduction.  Irrigated wound with safeclens and applied Xeroform dressing prior to applying well padded sugar tong with posterior slab splint using ortho glass. Neurovascularly intact after splinting.    (including critical care time)  Medications Ordered in ED Medications  morphine 2 MG/ML injection 4 mg (4 mg Intravenous Given 09/18/16 2233)  ceFAZolin (ANCEF) IVPB 1 g/50 mL premix (0 g Intravenous Stopped 09/18/16 2320)  Tdap (  BOOSTRIX) injection 0.5 mL (0.5 mLs Intramuscular Given 09/18/16 2233)     Initial Impression / Assessment and Plan / ED Course  I have reviewed the triage vital signs and the nursing notes.  Pertinent labs & imaging results that were available during my care of the patient were reviewed by me and considered in my medical decision making (see chart for details).     PT w/ open ankle deformity after unwitnessed fall out of bed. I was able to doppler DP pulse on exam, toes warm. Gave ancef, tdap, morphine and obtained above images and labs.   Plain film show bimalleolar fracture dislocation. I discussed with orthopedics, Dr. Ronnie Derby; given the open nature of the injury, I inquired about patient going to OR tonight. He requested that I reduce dislocation in ED and have pt admitted to [facility]. He wants to place patient on OR schedule for the morning. Reduced at bedside under procedural sedation, see procedure note for details. I contacted medicine and discussed with Dr. Tamala Julian, who has reviewed pt's chart regarding pre-operative risk. Discussed again w/ D.r Lucey who will admit pt at [facility]. Pt transferred for further care. Final Clinical Impressions(s) / ED Diagnoses   Final diagnoses:  Open dislocation of left ankle, initial encounter  Type I or II open bimalleolar fracture of left ankle, initial encounter    New Prescriptions New Prescriptions   No medications on file     Little, Wenda Overland, MD 09/19/16 [phone]

## 2016-09-19 ENCOUNTER — Inpatient Hospital Stay (HOSPITAL_COMMUNITY): Payer: Medicare Other | Admitting: Anesthesiology

## 2016-09-19 ENCOUNTER — Observation Stay (HOSPITAL_COMMUNITY): Payer: Medicare Other

## 2016-09-19 ENCOUNTER — Encounter (HOSPITAL_COMMUNITY): Payer: Self-pay | Admitting: Anesthesiology

## 2016-09-19 ENCOUNTER — Emergency Department (HOSPITAL_COMMUNITY): Payer: Medicare Other

## 2016-09-19 ENCOUNTER — Inpatient Hospital Stay (HOSPITAL_COMMUNITY): Payer: Medicare Other

## 2016-09-19 ENCOUNTER — Encounter (HOSPITAL_COMMUNITY)
Admission: EM | Disposition: A | Discharge status: Skilled Nursing Facility | Payer: Self-pay | Source: Home / Self Care | Attending: Orthopedic Surgery

## 2016-09-19 DIAGNOSIS — Z4789 Encounter for other orthopedic aftercare: Secondary | ICD-10-CM | POA: Diagnosis not present

## 2016-09-19 DIAGNOSIS — I739 Peripheral vascular disease, unspecified: Secondary | ICD-10-CM | POA: Diagnosis present

## 2016-09-19 DIAGNOSIS — S82892B Other fracture of left lower leg, initial encounter for open fracture type I or II: Secondary | ICD-10-CM | POA: Diagnosis present

## 2016-09-19 DIAGNOSIS — S92909A Unspecified fracture of unspecified foot, initial encounter for closed fracture: Secondary | ICD-10-CM | POA: Diagnosis not present

## 2016-09-19 DIAGNOSIS — S82892A Other fracture of left lower leg, initial encounter for closed fracture: Secondary | ICD-10-CM | POA: Diagnosis not present

## 2016-09-19 DIAGNOSIS — S8002XA Contusion of left knee, initial encounter: Secondary | ICD-10-CM | POA: Diagnosis not present

## 2016-09-19 DIAGNOSIS — Z8249 Family history of ischemic heart disease and other diseases of the circulatory system: Secondary | ICD-10-CM | POA: Diagnosis not present

## 2016-09-19 DIAGNOSIS — Z9181 History of falling: Secondary | ICD-10-CM | POA: Diagnosis not present

## 2016-09-19 DIAGNOSIS — M6281 Muscle weakness (generalized): Secondary | ICD-10-CM | POA: Diagnosis not present

## 2016-09-19 DIAGNOSIS — W06XXXA Fall from bed, initial encounter: Secondary | ICD-10-CM | POA: Diagnosis present

## 2016-09-19 DIAGNOSIS — G8918 Other acute postprocedural pain: Secondary | ICD-10-CM | POA: Diagnosis not present

## 2016-09-19 DIAGNOSIS — R296 Repeated falls: Secondary | ICD-10-CM | POA: Diagnosis not present

## 2016-09-19 DIAGNOSIS — S82892S Other fracture of left lower leg, sequela: Secondary | ICD-10-CM | POA: Diagnosis not present

## 2016-09-19 DIAGNOSIS — K219 Gastro-esophageal reflux disease without esophagitis: Secondary | ICD-10-CM | POA: Diagnosis present

## 2016-09-19 DIAGNOSIS — I10 Essential (primary) hypertension: Secondary | ICD-10-CM | POA: Diagnosis present

## 2016-09-19 DIAGNOSIS — I272 Pulmonary hypertension, unspecified: Secondary | ICD-10-CM | POA: Diagnosis not present

## 2016-09-19 DIAGNOSIS — Z23 Encounter for immunization: Secondary | ICD-10-CM | POA: Diagnosis not present

## 2016-09-19 DIAGNOSIS — R4789 Other speech disturbances: Secondary | ICD-10-CM | POA: Diagnosis not present

## 2016-09-19 DIAGNOSIS — R262 Difficulty in walking, not elsewhere classified: Secondary | ICD-10-CM | POA: Diagnosis not present

## 2016-09-19 DIAGNOSIS — Z833 Family history of diabetes mellitus: Secondary | ICD-10-CM | POA: Diagnosis not present

## 2016-09-19 DIAGNOSIS — M810 Age-related osteoporosis without current pathological fracture: Secondary | ICD-10-CM | POA: Diagnosis present

## 2016-09-19 DIAGNOSIS — J449 Chronic obstructive pulmonary disease, unspecified: Secondary | ICD-10-CM | POA: Diagnosis present

## 2016-09-19 DIAGNOSIS — Y92122 Bedroom in nursing home as the place of occurrence of the external cause: Secondary | ICD-10-CM | POA: Diagnosis not present

## 2016-09-19 DIAGNOSIS — E785 Hyperlipidemia, unspecified: Secondary | ICD-10-CM | POA: Diagnosis present

## 2016-09-19 DIAGNOSIS — S9305XA Dislocation of left ankle joint, initial encounter: Secondary | ICD-10-CM | POA: Diagnosis present

## 2016-09-19 DIAGNOSIS — G309 Alzheimer's disease, unspecified: Secondary | ICD-10-CM | POA: Diagnosis present

## 2016-09-19 DIAGNOSIS — M069 Rheumatoid arthritis, unspecified: Secondary | ICD-10-CM | POA: Diagnosis present

## 2016-09-19 DIAGNOSIS — M25572 Pain in left ankle and joints of left foot: Secondary | ICD-10-CM | POA: Diagnosis not present

## 2016-09-19 DIAGNOSIS — S82842B Displaced bimalleolar fracture of left lower leg, initial encounter for open fracture type I or II: Secondary | ICD-10-CM | POA: Diagnosis present

## 2016-09-19 DIAGNOSIS — F0280 Dementia in other diseases classified elsewhere without behavioral disturbance: Secondary | ICD-10-CM | POA: Diagnosis present

## 2016-09-19 HISTORY — PX: ORIF ANKLE FRACTURE: SHX5408

## 2016-09-19 LAB — SURGICAL PCR SCREEN
MRSA, PCR: NEGATIVE
STAPHYLOCOCCUS AUREUS: NEGATIVE

## 2016-09-19 SURGERY — OPEN REDUCTION INTERNAL FIXATION (ORIF) ANKLE FRACTURE
Anesthesia: General | Site: Ankle | Laterality: Left

## 2016-09-19 MED ORDER — ACETAMINOPHEN 500 MG PO TABS: 1000.0000 mg | ORAL_TABLET | Freq: Once | ORAL | Status: DC

## 2016-09-19 MED ORDER — POVIDONE-IODINE 10 % EX SWAB: 2.0000 "application " | Freq: Once | CUTANEOUS | Status: DC

## 2016-09-19 MED ORDER — ONDANSETRON HCL 4 MG PO TABS: 4.0000 mg | ORAL_TABLET | Freq: Four times a day (QID) | ORAL | Status: DC | PRN

## 2016-09-19 MED ORDER — METHOCARBAMOL 500 MG PO TABS
500.0000 mg | ORAL_TABLET | Freq: Four times a day (QID) | ORAL | Status: DC | PRN
  Administered 2016-09-19 – 2016-09-21 (×2): 500 mg via ORAL
  Filled 2016-09-19 (×2): qty 1

## 2016-09-19 MED ORDER — DOCUSATE SODIUM 100 MG PO CAPS
100.0000 mg | ORAL_CAPSULE | Freq: Two times a day (BID) | ORAL | Status: DC
  Administered 2016-09-19 – 2016-09-21 (×3): 100 mg via ORAL
  Filled 2016-09-19 (×4): qty 1

## 2016-09-19 MED ORDER — MEMANTINE HCL 10 MG PO TABS
10.0000 mg | ORAL_TABLET | Freq: Two times a day (BID) | ORAL | Status: DC
  Administered 2016-09-19 – 2016-09-21 (×3): 10 mg via ORAL
  Filled 2016-09-19 (×4): qty 1

## 2016-09-19 MED ORDER — OXYCODONE HCL 5 MG PO TABS
5.0000 mg | ORAL_TABLET | ORAL | Status: DC | PRN
  Filled 2016-09-19: qty 1

## 2016-09-19 MED ORDER — FENTANYL CITRATE (PF) 100 MCG/2ML IJ SOLN
50.0000 ug | INTRAMUSCULAR | Status: DC | PRN
  Administered 2016-09-19: 25 ug via INTRAVENOUS

## 2016-09-19 MED ORDER — ACETAMINOPHEN 650 MG RE SUPP: 650.0000 mg | Freq: Four times a day (QID) | RECTAL | Status: DC | PRN

## 2016-09-19 MED ORDER — SODIUM CHLORIDE 0.9 % IR SOLN
Status: DC | PRN
  Administered 2016-09-19: 1000 mL

## 2016-09-19 MED ORDER — BUPIVACAINE-EPINEPHRINE (PF) 0.5% -1:200000 IJ SOLN
INTRAMUSCULAR | Status: DC | PRN
  Administered 2016-09-19: 30 mL via PERINEURAL

## 2016-09-19 MED ORDER — DEXAMETHASONE SODIUM PHOSPHATE 10 MG/ML IJ SOLN
INTRAMUSCULAR | Status: DC | PRN
  Administered 2016-09-19: 5 mg via INTRAVENOUS

## 2016-09-19 MED ORDER — PROPOFOL 10 MG/ML IV BOLUS
INTRAVENOUS | Status: DC | PRN
  Administered 2016-09-19: 80 mg via INTRAVENOUS

## 2016-09-19 MED ORDER — CEFAZOLIN SODIUM-DEXTROSE 2-4 GM/100ML-% IV SOLN
2.0000 g | INTRAVENOUS | Status: AC
  Administered 2016-09-19: 2 g via INTRAVENOUS

## 2016-09-19 MED ORDER — FENTANYL CITRATE (PF) 250 MCG/5ML IJ SOLN
INTRAMUSCULAR | Status: AC
Start: 2016-09-19 — End: 2016-09-19
  Filled 2016-09-19: qty 5

## 2016-09-19 MED ORDER — CHLORHEXIDINE GLUCONATE 4 % EX LIQD: 60.0000 mL | Freq: Once | CUTANEOUS | Status: DC

## 2016-09-19 MED ORDER — ONDANSETRON HCL 4 MG/2ML IJ SOLN
INTRAMUSCULAR | Status: DC | PRN
  Administered 2016-09-19: 4 mg via INTRAVENOUS

## 2016-09-19 MED ORDER — FAMOTIDINE 20 MG PO TABS
20.0000 mg | ORAL_TABLET | Freq: Every day | ORAL | Status: DC
  Administered 2016-09-20 – 2016-09-21 (×2): 20 mg via ORAL
  Filled 2016-09-19 (×2): qty 1

## 2016-09-19 MED ORDER — SENNOSIDES-DOCUSATE SODIUM 8.6-50 MG PO TABS: 1.0000 | ORAL_TABLET | Freq: Every evening | ORAL | Status: DC | PRN

## 2016-09-19 MED ORDER — PROPOFOL 10 MG/ML IV BOLUS
INTRAVENOUS | Status: AC
  Filled 2016-09-19: qty 20

## 2016-09-19 MED ORDER — METHOCARBAMOL 1000 MG/10ML IJ SOLN
500.0000 mg | Freq: Four times a day (QID) | INTRAVENOUS | Status: DC | PRN
  Filled 2016-09-19: qty 5

## 2016-09-19 MED ORDER — MIDAZOLAM HCL 2 MG/2ML IJ SOLN
INTRAMUSCULAR | Status: AC
  Filled 2016-09-19: qty 2

## 2016-09-19 MED ORDER — LACTATED RINGERS IV SOLN
INTRAVENOUS | Status: DC | PRN
  Administered 2016-09-19: 13:00:00 via INTRAVENOUS

## 2016-09-19 MED ORDER — BISACODYL 5 MG PO TBEC
5.0000 mg | DELAYED_RELEASE_TABLET | Freq: Every day | ORAL | Status: DC | PRN
  Administered 2016-09-21: 5 mg via ORAL
  Filled 2016-09-19: qty 1

## 2016-09-19 MED ORDER — FLEET ENEMA 7-19 GM/118ML RE ENEM: 1.0000 | ENEMA | Freq: Once | RECTAL | Status: DC | PRN

## 2016-09-19 MED ORDER — HYDROCODONE-ACETAMINOPHEN 7.5-325 MG PO TABS
1.0000 | ORAL_TABLET | Freq: Four times a day (QID) | ORAL | Status: DC
  Administered 2016-09-19 – 2016-09-21 (×5): 1 via ORAL
  Filled 2016-09-19 (×6): qty 1

## 2016-09-19 MED ORDER — ENOXAPARIN SODIUM 30 MG/0.3ML ~~LOC~~ SOLN
30.0000 mg | SUBCUTANEOUS | Status: DC
  Administered 2016-09-19 – 2016-09-21 (×2): 30 mg via SUBCUTANEOUS
  Filled 2016-09-19 (×2): qty 0.3

## 2016-09-19 MED ORDER — SACCHAROMYCES BOULARDII 250 MG PO CAPS
250.0000 mg | ORAL_CAPSULE | Freq: Two times a day (BID) | ORAL | Status: DC
  Administered 2016-09-19 – 2016-09-21 (×3): 250 mg via ORAL
  Filled 2016-09-19 (×4): qty 1

## 2016-09-19 MED ORDER — ONDANSETRON HCL 4 MG/2ML IJ SOLN: 4.0000 mg | Freq: Four times a day (QID) | INTRAMUSCULAR | Status: DC | PRN

## 2016-09-19 MED ORDER — ONDANSETRON HCL 4 MG/2ML IJ SOLN
INTRAMUSCULAR | Status: AC
  Filled 2016-09-19: qty 2

## 2016-09-19 MED ORDER — LIDOCAINE-EPINEPHRINE (PF) 1.5 %-1:200000 IJ SOLN
INTRAMUSCULAR | Status: DC | PRN
  Administered 2016-09-19: 30 mL via PERINEURAL

## 2016-09-19 MED ORDER — CEPHALEXIN 500 MG PO CAPS: 500.0000 mg | ORAL_CAPSULE | Freq: Four times a day (QID) | ORAL | Status: DC

## 2016-09-19 MED ORDER — DIPHENHYDRAMINE HCL 12.5 MG/5ML PO ELIX: 12.5000 mg | ORAL_SOLUTION | ORAL | Status: DC | PRN

## 2016-09-19 MED ORDER — ZOLPIDEM TARTRATE 5 MG PO TABS: 5.0000 mg | ORAL_TABLET | Freq: Every evening | ORAL | Status: DC | PRN

## 2016-09-19 MED ORDER — CEFAZOLIN SODIUM-DEXTROSE 2-4 GM/100ML-% IV SOLN
INTRAVENOUS | Status: AC
  Filled 2016-09-19: qty 100

## 2016-09-19 MED ORDER — FENTANYL CITRATE (PF) 100 MCG/2ML IJ SOLN
INTRAMUSCULAR | Status: AC
  Administered 2016-09-19: 25 ug via INTRAVENOUS
  Filled 2016-09-19: qty 2

## 2016-09-19 MED ORDER — HYDROMORPHONE HCL 1 MG/ML IJ SOLN
0.5000 mg | INTRAMUSCULAR | Status: DC | PRN
  Administered 2016-09-20 – 2016-09-21 (×2): 0.5 mg via INTRAVENOUS
  Filled 2016-09-19 (×3): qty 0.5

## 2016-09-19 MED ORDER — ALUM & MAG HYDROXIDE-SIMETH 200-200-20 MG/5ML PO SUSP
30.0000 mL | ORAL | Status: DC | PRN
Start: 2016-09-19 — End: 2016-09-21

## 2016-09-19 MED ORDER — PHENYLEPHRINE 40 MCG/ML (10ML) SYRINGE FOR IV PUSH (FOR BLOOD PRESSURE SUPPORT)
PREFILLED_SYRINGE | INTRAVENOUS | Status: AC
  Filled 2016-09-19: qty 10

## 2016-09-19 MED ORDER — FUROSEMIDE 20 MG PO TABS
20.0000 mg | ORAL_TABLET | Freq: Two times a day (BID) | ORAL | Status: DC
  Administered 2016-09-20 – 2016-09-21 (×3): 20 mg via ORAL
  Filled 2016-09-19 (×3): qty 1

## 2016-09-19 MED ORDER — ACETAMINOPHEN 500 MG PO TABS
1000.0000 mg | ORAL_TABLET | Freq: Four times a day (QID) | ORAL | Status: DC
  Administered 2016-09-19 – 2016-09-20 (×4): 1000 mg via ORAL
  Filled 2016-09-19 (×5): qty 2

## 2016-09-19 MED ORDER — PHENYLEPHRINE 40 MCG/ML (10ML) SYRINGE FOR IV PUSH (FOR BLOOD PRESSURE SUPPORT)
PREFILLED_SYRINGE | INTRAVENOUS | Status: DC | PRN
  Administered 2016-09-19 (×3): 80 ug via INTRAVENOUS

## 2016-09-19 MED ORDER — DEXAMETHASONE SODIUM PHOSPHATE 10 MG/ML IJ SOLN
INTRAMUSCULAR | Status: AC
  Filled 2016-09-19: qty 1

## 2016-09-19 MED ORDER — ACETAMINOPHEN 325 MG PO TABS
650.0000 mg | ORAL_TABLET | Freq: Four times a day (QID) | ORAL | Status: DC | PRN
  Administered 2016-09-21: 650 mg via ORAL
  Filled 2016-09-19: qty 2

## 2016-09-19 MED ORDER — MIDAZOLAM HCL 2 MG/2ML IJ SOLN: 1.0000 mg | INTRAMUSCULAR | Status: DC | PRN

## 2016-09-19 SURGICAL SUPPLY — 71 items
BANDAGE ACE 4X5 VEL STRL LF (GAUZE/BANDAGES/DRESSINGS) ×3 IMPLANT
BANDAGE ACE 6X5 VEL STRL LF (GAUZE/BANDAGES/DRESSINGS) ×3 IMPLANT
BANDAGE ESMARK 6X9 LF (GAUZE/BANDAGES/DRESSINGS) ×1 IMPLANT
BIT DRILL 2.5X110 QC LCP DISP (BIT) ×3 IMPLANT
BIT DRILL CANN 2.7X625 NONSTRL (BIT) ×3 IMPLANT
BIT DRILL QC 3.5X110 (BIT) ×3 IMPLANT
BNDG COHESIVE 6X5 TAN STRL LF (GAUZE/BANDAGES/DRESSINGS) IMPLANT
BNDG ESMARK 6X9 LF (GAUZE/BANDAGES/DRESSINGS) ×3
CLOSURE WOUND 1/2 X4 (GAUZE/BANDAGES/DRESSINGS)
COVER SURGICAL LIGHT HANDLE (MISCELLANEOUS) ×3 IMPLANT
CUFF TOURNIQUET SINGLE 34IN LL (TOURNIQUET CUFF) ×3 IMPLANT
DRAPE C-ARM 42X72 X-RAY (DRAPES) ×3 IMPLANT
DRAPE HALF SHEET 40X57 (DRAPES) ×3 IMPLANT
DRAPE INCISE IOBAN 66X45 STRL (DRAPES) ×3 IMPLANT
DRAPE U-SHAPE 47X51 STRL (DRAPES) ×3 IMPLANT
DRSG EMULSION OIL 3X3 NADH (GAUZE/BANDAGES/DRESSINGS) ×3 IMPLANT
DURAPREP 26ML APPLICATOR (WOUND CARE) ×3 IMPLANT
ELECT REM PT RETURN 9FT ADLT (ELECTROSURGICAL) ×3
ELECTRODE REM PT RTRN 9FT ADLT (ELECTROSURGICAL) ×1 IMPLANT
GAUZE SPONGE 4X4 12PLY STRL (GAUZE/BANDAGES/DRESSINGS) ×3 IMPLANT
GAUZE XEROFORM 5X9 LF (GAUZE/BANDAGES/DRESSINGS) ×3 IMPLANT
GLOVE BIOGEL PI IND STRL 6.5 (GLOVE) ×4 IMPLANT
GLOVE BIOGEL PI IND STRL 8.5 (GLOVE) ×4 IMPLANT
GLOVE BIOGEL PI INDICATOR 6.5 (GLOVE) ×8
GLOVE BIOGEL PI INDICATOR 8.5 (GLOVE) ×8
GLOVE BIOGEL PI ORTHO PRO SZ8 (GLOVE) ×2
GLOVE PI ORTHO PRO STRL SZ8 (GLOVE) ×1 IMPLANT
GLOVE SURG ORTHO 8.0 STRL STRW (GLOVE) ×18 IMPLANT
GOWN STRL REUS W/ TWL XL LVL3 (GOWN DISPOSABLE) ×3 IMPLANT
GOWN STRL REUS W/TWL 2XL LVL3 (GOWN DISPOSABLE) IMPLANT
GOWN STRL REUS W/TWL XL LVL3 (GOWN DISPOSABLE) ×6
GUIDEWARE NON THREAD 1.25X150 (WIRE) ×6
GUIDEWIRE NON THREAD 1.25X150 (WIRE) ×2 IMPLANT
KIT BASIN OR (CUSTOM PROCEDURE TRAY) ×3 IMPLANT
KIT ROOM TURNOVER OR (KITS) ×3 IMPLANT
NEEDLE HYPO 25GX1X1/2 BEV (NEEDLE) IMPLANT
NS IRRIG 1000ML POUR BTL (IV SOLUTION) ×3 IMPLANT
PACK ORTHO EXTREMITY (CUSTOM PROCEDURE TRAY) ×3 IMPLANT
PAD ABD 8X10 STRL (GAUZE/BANDAGES/DRESSINGS) ×3 IMPLANT
PAD ARMBOARD 7.5X6 YLW CONV (MISCELLANEOUS) IMPLANT
PAD CAST 4YDX4 CTTN HI CHSV (CAST SUPPLIES) ×1 IMPLANT
PADDING CAST ABS 6INX4YD NS (CAST SUPPLIES) ×2
PADDING CAST ABS COTTON 6X4 NS (CAST SUPPLIES) ×1 IMPLANT
PADDING CAST COTTON 4X4 STRL (CAST SUPPLIES) ×2
PADDING CAST COTTON 6X4 STRL (CAST SUPPLIES) ×3 IMPLANT
PLATE LCP 3.5 1/3 TUB 7HX81 (Plate) ×3 IMPLANT
SCREW CANC FT ST SFS 4X14 (Screw) ×3 IMPLANT
SCREW CANC FT ST SFS 4X16 (Screw) ×3 IMPLANT
SCREW CORTEX 3.5 12MM (Screw) ×4 IMPLANT
SCREW CORTEX 3.5 14MM (Screw) ×2 IMPLANT
SCREW CORTEX 3.5 16MM (Screw) ×2 IMPLANT
SCREW LOCK CORT ST 3.5X12 (Screw) ×2 IMPLANT
SCREW LOCK CORT ST 3.5X14 (Screw) ×1 IMPLANT
SCREW LOCK CORT ST 3.5X16 (Screw) ×1 IMPLANT
SCREW SHORT THREAD 4.0X40 (Screw) ×6 IMPLANT
SPLINT PLASTER CAST XFAST 5X30 (CAST SUPPLIES) ×1 IMPLANT
SPLINT PLASTER XFAST SET 5X30 (CAST SUPPLIES) ×2
SPONGE LAP 18X18 X RAY DECT (DISPOSABLE) ×3 IMPLANT
STAPLER VISISTAT 35W (STAPLE) IMPLANT
STRIP CLOSURE SKIN 1/2X4 (GAUZE/BANDAGES/DRESSINGS) IMPLANT
SUCTION FRAZIER HANDLE 10FR (MISCELLANEOUS)
SUCTION TUBE FRAZIER 10FR DISP (MISCELLANEOUS) IMPLANT
SUT MNCRL AB 3-0 PS2 18 (SUTURE) IMPLANT
SUT VIC AB 2-0 CT1 27 (SUTURE) ×4
SUT VIC AB 2-0 CT1 TAPERPNT 27 (SUTURE) ×2 IMPLANT
SYR CONTROL 10ML LL (SYRINGE) IMPLANT
TOWEL OR 17X24 6PK STRL BLUE (TOWEL DISPOSABLE) IMPLANT
TOWEL OR 17X26 10 PK STRL BLUE (TOWEL DISPOSABLE) ×3 IMPLANT
TUBE CONNECTING 12'X1/4 (SUCTIONS) ×1
TUBE CONNECTING 12X1/4 (SUCTIONS) ×2 IMPLANT
WATER STERILE IRR 1000ML POUR (IV SOLUTION) IMPLANT

## 2016-09-19 NOTE — Anesthesia Procedure Notes (Signed)
Anesthesia Regional Block: Popliteal block   Pre-Anesthetic Checklist: ,, timeout performed, Correct Patient, Correct Site, Correct Laterality, Correct Procedure, Correct Position, site marked, Risks and benefits discussed,  Surgical consent,  Pre-op evaluation,  At surgeon's request and post-op pain management  Laterality: Left  Prep: chloraprep       Needles:  Injection technique: Single-shot  Needle Type: Echogenic Stimulator Needle     Needle Length: 10cm  Needle Gauge: 21     Additional Needles:   Procedures: ultrasound guided, nerve stimulator,,,,,,   Nerve Stimulator or Paresthesia:  Response: 0.4 mA,   Additional Responses:   Narrative:  Start time: 09/19/2016 12:40 PM End time: 09/19/2016 1:00 PM Injection made incrementally with aspirations every 5 mL.  Performed by: Personally  Anesthesiologist: Lillia Abed  Additional Notes: Monitors applied. Patient sedated. Sterile prep and drape,hand hygiene and sterile gloves were used. Relevant anatomy identified.Needle position confirmed.Local anesthetic injected incrementally after negative aspiration. Local anesthetic spread visualized around nerve(s). Vascular puncture avoided. No complications. Image printed for medical record.The patient tolerated the procedure well.  Additional Saphenous nerve block performed. 15cc Local Anesthetic mixture placed under ultrasonic guidance along the medio-inferior border of the Sartorious muscle 6 inches above the knee.  No Problems encountered.  Lillia Abed MD

## 2016-09-19 NOTE — Anesthesia Postprocedure Evaluation (Signed)
Anesthesia Post Note  Patient: [name]  Procedure(s) Performed: Procedure(s) (LRB): OPEN REDUCTION INTERNAL FIXATION (ORIF) ANKLE FRACTURE (Left)     Patient location during evaluation: PACU Anesthesia Type: General and Regional Level of consciousness: awake and alert Pain management: pain level controlled Vital Signs Assessment: post-procedure vital signs reviewed and stable Respiratory status: spontaneous breathing, nonlabored ventilation, respiratory function stable and patient connected to nasal cannula oxygen Cardiovascular status: blood pressure returned to baseline and stable Postop Assessment: no signs of nausea or vomiting Anesthetic complications: no    Last Vitals:  Vitals:   09/19/16 1540 09/19/16 1600  BP:  (!) 116/48  Pulse:  77  Resp:  15  Temp: (!) 36.4 C     Last Pain:  Vitals:   09/19/16 1540  TempSrc:   PainSc: [place]

## 2016-09-19 NOTE — ED Notes (Signed)
Ortho tech has been called from [facility] for conscious sedation. Care has been transferred to [place]. RN for conscious sedation.

## 2016-09-19 NOTE — H&P (Signed)
[name] MRN:  [id number] DOB/SEX:  09-22-1924/female  CHIEF COMPLAINT:  Painful left ankle  HISTORY: Patient is a 83 y.o. female presented with a history of pain in the left ankle. Onset of symptoms was abrupt starting last night due to a fall.  Patient has been treated conservatively with over-the-counter NSAIDs and activity modification. Patient currently rates pain in the ankle at 10 out of 10 with activity. There is pain at night.  PAST MEDICAL HISTORY: Patient Active Problem List   Diagnosis Date Noted  . Open left ankle fracture 09/19/2016  . Internal hemorrhoids 11/16/2015  . Edema 11/16/2015  . Hereditary and idiopathic peripheral neuropathy 10/11/2015  . Alzheimer's disease 06/22/2015  . Osteopenia, senile 04/15/2014  . UTI (urinary tract infection) 04/01/2014  . Constipation 02/25/2014  . Small vessel disease, cerebrovascular 02/25/2014  . GERD (gastroesophageal reflux disease) 02/25/2014  . Heart murmur on physical examination 02/25/2014  . Fracture, subtrochanteric, right femur, closed ([place])   . Depression   . Gait disorder 12/21/2013  . Foot drop, bilateral 12/21/2013  . Benign essential tremor 12/11/2012  . HYPERTENSION, PULMONARY 03/30/2010   Past Medical History:  Diagnosis Date  . Alzheimer's disease 06/22/2015  . Anemia    iron deficient  . Anemia   . Anxiety   . Chronic airway obstruction, not elsewhere classified   . Chronic back pain   . Chronic cystitis   . Cystitis, chronic    Dr Gaynelle Arabian  . Degenerative arthritis   . Depression   . Disturbance of skin sensation   . Disturbance of skin sensation   . Dyslipidemia   . Dyspnea    Dr Chase Caller  . Esophageal reflux   . Essential and other specified forms of tremor 12/11/2012  . Foot drop, bilateral 12/21/2013  . Gait disorder   . GERD (gastroesophageal reflux disease)   . Hemorrhoids   . Hereditary and idiopathic peripheral neuropathy 10/11/2015  . Hernia   . History of cerebrovascular  disease    Moderate level small vessel disease  . Hyperlipidemia    mild  . Hypertension   . Osteoarthritis    right knee injection per Dr Trudie Reed  . Osteoporosis   . Other abnormal clinical finding   . Pain in joint, upper arm   . Peripheral vascular disease ([place])   . Rheumatoid arthritis ([place])    Dr Trudie Reed  . Tobacco use disorder   . Tremor    and gait disorder--Dr Jannifer Franklin  . Unspecified vitamin D deficiency    Past Surgical History:  Procedure Laterality Date  . APPENDECTOMY    . BACK SURGERY Bilateral    X2  . Bladder resuspension procedure    . CATARACT EXTRACTION Bilateral   . FEMUR IM NAIL Right 02/22/2014   Procedure: INTRAMEDULLARY (IM) NAIL FEMORAL;  Surgeon: Johnny Bridge, MD;  Location: [place];  Service: Orthopedics;  Laterality: Right;  . INGUINAL HERNIA REPAIR Bilateral   . TONSILLECTOMY       MEDICATIONS:   Prescriptions Prior to Admission  Medication Sig Dispense Refill Last Dose  . acetaminophen (TYLENOL) 650 MG CR tablet Take 650 mg by mouth 2 (two) times daily. Take q4h prn for fever   09/18/2016 at 1934  . alum & mag hydroxide-simeth (MAALOX/MYLANTA) 200-200-20 MG/5ML suspension Take 30 mLs by mouth every 4 (four) hours as needed for indigestion or heartburn.   09/18/2016 at 1450  . Calcium Carbonate-Vitamin D (CALTRATE 600+D) 600-400 MG-UNIT per tablet Take 1 tablet by mouth  daily.   09/18/2016 at 0849  . cephALEXin (KEFLEX) 500 MG capsule Take 500 mg by mouth 4 (four) times daily.   09/18/2016 at 1753  . Cholecalciferol (VITAMIN D3) 10000 units TABS Take 1 tablet by mouth daily.   09/18/2016 at 0849  . furosemide (LASIX) 20 MG tablet Take 20 mg by mouth 2 (two) times daily.    09/18/2016 at 1539  . Hyoscyamine Sulfate SL (LEVSIN/SL) 0.125 MG SUBL Place under the tongue every 12 (twelve) hours as needed (for cramps).    unknown at Unknown time  . memantine (NAMENDA) 10 MG tablet Take 10 mg by mouth 2 (two) times daily.   09/18/2016 at 1934  . Misc Natural Products  (OSTEO BI-FLEX TRIPLE STRENGTH) TABS Take 1 tablet by mouth daily.   09/18/2016 at 0849  . Multiple Vitamin (MULTIVITAMIN) tablet Take 1 tablet by mouth daily.   09/18/2016 at 0849  . Nutritional Supplements (RESOURCE 2.0) LIQD Take by mouth. 59ml twice daily   09/18/2016 at 1539  . ranitidine (ZANTAC) 150 MG tablet Take 150 mg by mouth 2 (two) times daily.   09/18/2016 at 1824  . saccharomyces boulardii (FLORASTOR) 250 MG capsule Take 250 mg by mouth 2 (two) times daily.   09/18/2016 at 1824  . sennosides-docusate sodium (SENOKOT-S) 8.6-50 MG tablet Take 2 tablets by mouth daily. 30 tablet 1 09/18/2016 at 1934    ALLERGIES:   Allergies  Allergen Reactions  . Novocain [Procaine]     Passed  out  . Limbrel [Flavocoxid]     Dizziness   . Sertraline Anxiety    REVIEW OF SYSTEMS:  Pertinent items are noted in HPI. patient unable to give full ROS due to mental status   FAMILY HISTORY:   Family History  Problem Relation Age of Onset  . Heart Problems Mother   . Diabetes Father   . Heart attack Father   . Cancer Sister        Brain tumor  . Emphysema Brother     SOCIAL HISTORY:   Social History  Substance Use Topics  . Smoking status: Never Smoker  . Smokeless tobacco: Never Used  . Alcohol use No     EXAMINATION:  Vital signs in last 24 hours: Temp:  [97.7 F (36.5 C)-98.2 F (36.8 C)] 98.2 F (36.8 C) (07/05 0300) Pulse Rate:  [76-97] 90 (07/05 0300) Resp:  [14-22] 18 (07/05 0145) BP: (115-153)/(57-96) 120/68 (07/05 0300) SpO2:  [91 %-99 %] 96 % (07/05 0300)  BP 120/68 (BP Location: Left Arm)   Pulse 90   Temp 98.2 F (36.8 C) (Oral)   Resp 18   SpO2 96%  General appearance: alert and mild distress Head: Normocephalic, without obvious abnormality, atraumatic Lungs: clear to auscultation bilaterally Heart: regular rate and rhythm, S1, S2 normal, no murmur, click, rub or gallop  Musculoskeletal:  Swelling, tenderness, decreased ROM in left ankle Imaging Review Plain  radiographs demonstrated a bimalleolar fracture/dislocation to the left ankle. This was reduced by the ER doctor. Assessment/Plan: bimalleolar fracture left ankle  The patient history, physical examination and imaging studies are consistent with bimalleolar fracture of the left ankle. The patient has failed conservative treatment.  An ORIF is needed. The procedure,  risks, and benefits of ORIF were presented and reviewed. The risks including but not limited to  infection, blood clots, vascular injury, stiffness complications among others were discussed. The patient acknowledged the explanation, agreed to proceed with the plan.  Donia Ast 09/19/2016, 8:04 AM

## 2016-09-19 NOTE — Anesthesia Preprocedure Evaluation (Addendum)
Anesthesia Evaluation  Patient identified by MRN, date of birth, ID band Patient awake    Reviewed: Allergy & Precautions, NPO status , Patient's Chart, lab work & pertinent test results  Airway Mallampati: II       Dental  (+) Dental Advisory Given   Pulmonary COPD,    breath sounds clear to auscultation       Cardiovascular hypertension, + Peripheral Vascular Disease  + Valvular Problems/Murmurs  Rhythm:Regular Rate:Normal     Neuro/Psych PSYCHIATRIC DISORDERS Anxiety Depression  Neuromuscular disease    GI/Hepatic GERD  Medicated,  Endo/Other    Renal/GU      Musculoskeletal  (+) Arthritis , Osteoarthritis,    Abdominal   Peds  Hematology   Anesthesia Other Findings - HLD  Reproductive/Obstetrics negative OB ROS                           Anesthesia Physical Anesthesia Plan  ASA: III  Anesthesia Plan: General   Post-op Pain Management:  Regional for Post-op pain   Induction:   PONV Risk Score and Plan: 4 or greater and Ondansetron, Dexamethasone, Propofol, Midazolam and Scopolamine patch - Pre-op  Airway Management Planned: LMA  Additional Equipment:   Intra-op Plan:   Post-operative Plan: Extubation in OR  Informed Consent: I have reviewed the patients History and Physical, chart, labs and discussed the procedure including the risks, benefits and alternatives for the proposed anesthesia with the patient or authorized representative who has indicated his/her understanding and acceptance.   Dental advisory given  Plan Discussed with: CRNA  Anesthesia Plan Comments:        Anesthesia Quick Evaluation

## 2016-09-19 NOTE — ED Notes (Signed)
Patient is awake and alert/comfortable in bed-prepared for conscious sedation. Ortho tech from [facility] has been paged. Moderate deformity LLE-left ankle with moderate swelling-cool to touch with brisk<2 sec cap refill.

## 2016-09-19 NOTE — ED Notes (Signed)
Report given to Pamala Hurry, RN with [facility].

## 2016-09-19 NOTE — Anesthesia Procedure Notes (Signed)
Procedure Name: LMA Insertion Date/Time: 09/19/2016 1:25 PM Performed by: Freddie Breech Pre-anesthesia Checklist: Patient identified, Emergency Drugs available, Suction available and Patient being monitored Patient Re-evaluated:Patient Re-evaluated prior to inductionOxygen Delivery Method: Circle System Utilized Preoxygenation: Pre-oxygenation with 100% oxygen Intubation Type: IV induction Ventilation: Mask ventilation without difficulty LMA: LMA inserted LMA Size: 3.0 Number of attempts: 1 Airway Equipment and Method: Bite block Placement Confirmation: positive ETCO2 Tube secured with: Tape Dental Injury: Teeth and Oropharynx as per pre-operative assessment

## 2016-09-19 NOTE — ED Notes (Signed)
Patient is resting comfortably at present. Wound care to left medial ankle during conscious sedation-wound irrigated with Saf-Clens and xeroform dressing and bulky 4x4's applied. Sugartong splint to LLE- cap refill 2-3 seconds to toes after splint applied.

## 2016-09-19 NOTE — Progress Notes (Addendum)
Patient has arrived to the unit. Patient  transported from [place] by [facility] . Patient  is resting at this time.

## 2016-09-19 NOTE — Consult Note (Signed)
NAME: [name] MRN:   [id number] DOB:   10-24-1924   CHIEF COMPLAINT:  Left ankle pain  HISTORY:   [name] a 83 y.o. female  with left  Ankle Pain Patient complains of left ankle pain. Onset of the symptoms was yesterday. Inciting event: injured while getting out of bed. Current symptoms include: inability to bear weight. Aggravating factors: direct pressure. Symptoms have stabilized. Patient has had no prior ankle problems. Previous visits for this problem: none.  Evaluation to date: plain films: abnormal bimalleolar fracture/dislocation.  Treatment to date: reduction in ED followed by splinting .     PAST MEDICAL HISTORY:   Past Medical History:  Diagnosis Date  . Alzheimer's disease 06/22/2015  . Anemia    iron deficient  . Anemia   . Anxiety   . Chronic airway obstruction, not elsewhere classified   . Chronic back pain   . Chronic cystitis   . Cystitis, chronic    Dr Gaynelle Arabian  . Degenerative arthritis   . Depression   . Disturbance of skin sensation   . Disturbance of skin sensation   . Dyslipidemia   . Dyspnea    Dr Chase Caller  . Esophageal reflux   . Essential and other specified forms of tremor 12/11/2012  . Foot drop, bilateral 12/21/2013  . Gait disorder   . GERD (gastroesophageal reflux disease)   . Hemorrhoids   . Hereditary and idiopathic peripheral neuropathy 10/11/2015  . Hernia   . History of cerebrovascular disease    Moderate level small vessel disease  . Hyperlipidemia    mild  . Hypertension   . Osteoarthritis    right knee injection per Dr Trudie Reed  . Osteoporosis   . Other abnormal clinical finding   . Pain in joint, upper arm   . Peripheral vascular disease ([place])   . Rheumatoid arthritis ([place])    Dr Trudie Reed  . Tobacco use disorder   . Tremor    and gait disorder--Dr Jannifer Franklin  . Unspecified vitamin D deficiency     PAST SURGICAL HISTORY:   Past Surgical History:  Procedure Laterality Date  . APPENDECTOMY    . BACK SURGERY  Bilateral    X2  . Bladder resuspension procedure    . CATARACT EXTRACTION Bilateral   . FEMUR IM NAIL Right 02/22/2014   Procedure: INTRAMEDULLARY (IM) NAIL FEMORAL;  Surgeon: Johnny Bridge, MD;  Location: [facility];  Service: Orthopedics;  Laterality: Right;  . INGUINAL HERNIA REPAIR Bilateral   . TONSILLECTOMY      MEDICATIONS:   Medications Prior to Admission  Medication Sig Dispense Refill  . acetaminophen (TYLENOL) 650 MG CR tablet Take 650 mg by mouth 2 (two) times daily. Take q4h prn for fever    . alum & mag hydroxide-simeth (MAALOX/MYLANTA) 200-200-20 MG/5ML suspension Take 30 mLs by mouth every 4 (four) hours as needed for indigestion or heartburn.    . Calcium Carbonate-Vitamin D (CALTRATE 600+D) 600-400 MG-UNIT per tablet Take 1 tablet by mouth daily.    . cephALEXin (KEFLEX) 500 MG capsule Take 500 mg by mouth 4 (four) times daily.    . Cholecalciferol (VITAMIN D3) 10000 units TABS Take 1 tablet by mouth daily.    . furosemide (LASIX) 20 MG tablet Take 20 mg by mouth 2 (two) times daily.     Marland Kitchen Hyoscyamine Sulfate SL (LEVSIN/SL) 0.125 MG SUBL Place under the tongue every 12 (twelve) hours as needed (for cramps).     . memantine (NAMENDA)  10 MG tablet Take 10 mg by mouth 2 (two) times daily.    . Misc Natural Products (OSTEO BI-FLEX TRIPLE STRENGTH) TABS Take 1 tablet by mouth daily.    . Multiple Vitamin (MULTIVITAMIN) tablet Take 1 tablet by mouth daily.    . Nutritional Supplements (RESOURCE 2.0) LIQD Take by mouth. 19ml twice daily    . ranitidine (ZANTAC) 150 MG tablet Take 150 mg by mouth 2 (two) times daily.    Marland Kitchen saccharomyces boulardii (FLORASTOR) 250 MG capsule Take 250 mg by mouth 2 (two) times daily.    . sennosides-docusate sodium (SENOKOT-S) 8.6-50 MG tablet Take 2 tablets by mouth daily. 30 tablet 1    ALLERGIES:   Allergies  Allergen Reactions  . Novocain [Procaine]     Passed  out  . Limbrel [Flavocoxid]     Dizziness   . Sertraline Anxiety    REVIEW  OF SYSTEMS:   Negative except left ankle pain difficult to fully obtain due to patients mental status  FAMILY HISTORY:   Family History  Problem Relation Age of Onset  . Heart Problems Mother   . Diabetes Father   . Heart attack Father   . Cancer Sister        Brain tumor  . Emphysema Brother     SOCIAL HISTORY:   reports that she has never smoked. She has never used smokeless tobacco. She reports that she does not drink alcohol or use drugs.  PHYSICAL EXAM:  General appearance: alert and mild distress Head: Normocephalic, without obvious abnormality, atraumatic Resp: clear to auscultation bilaterally Cardio: regular rate and rhythm, S1, S2 normal, no murmur, click, rub or gallop Extremities: left ankle pain/swelling/decreased ROM. all other extremeties normal Skin: Skin color, texture, turgor normal. No rashes or lesions    LABORATORY STUDIES:  Recent Labs  09/18/16 2232 09/18/16 2239  WBC 11.9*  --   HGB 13.0 13.3  HCT 39.5 39.0  PLT 358  --      Recent Labs  09/18/16 2232 09/18/16 2239  NA 137 136  K 4.6 4.5  CL 99* 96*  CO2 33*  --   GLUCOSE 115* 110*  BUN 24* 30*  CREATININE 1.06* 1.00  CALCIUM 9.4  --     STUDIES/RESULTS:  Ct Head Wo Contrast  Result Date: 09/18/2016 CLINICAL DATA:  Dementia patient post unwitnessed fall landing on left side. Struck head on bed. EXAM: CT HEAD WITHOUT CONTRAST CT CERVICAL SPINE WITHOUT CONTRAST TECHNIQUE: Multidetector CT imaging of the head and cervical spine was performed following the standard protocol without intravenous contrast. Multiplanar CT image reconstructions of the cervical spine were also generated. COMPARISON:  Head CT 02/21/2014 FINDINGS: CT HEAD FINDINGS Brain: No evidence of acute infarction, hemorrhage, hydrocephalus, extra-axial collection or mass lesion/mass effect. Stable ventriculomegaly and ventricular morphology. Stable advanced chronic small vessel ischemia. Remote lacunar infarct in the right caudate.  Vascular: Atherosclerosis of skullbase vasculature without hyperdense vessel or abnormal calcification. Skull: No skull fracture.  No focal lesion. Sinuses/Orbits: Paranasal sinuses and mastoid air cells are clear. The visualized orbits are unremarkable. Bilateral cataract resection. Other: None. CT CERVICAL SPINE FINDINGS Alignment: No traumatic malalignment. Minimal anterolisthesis of C7 on T1, retrolisthesis of C5 on C6, and anterolisthesis of C3 on C4 appears degenerative. This at the normally aligned. Skull base and vertebrae: No acute fracture. Skullbase and dens are intact. Vertebral body marrow heterogeneity likely secondary to osteopenia. No discrete focal lesion. Soft tissues and spinal canal: No prevertebral fluid or swelling.  No visible canal hematoma. Disc levels: Diffuse disc space narrowing and endplate spurring, most prominent at C5-C6. Multilevel facet arthropathy. Upper chest: No acute abnormality. Atherosclerosis of the visualized thoracic aorta. Other: Carotid calcifications. IMPRESSION: 1. No acute intracranial abnormality. No skull fracture. Stable chronic change. 2. Multilevel degenerative change in the cervical spine without acute fracture or subluxation. Electronically Signed   By: Jeb Levering M.D.   On: 09/18/2016 23:57   Ct Cervical Spine Wo Contrast  Result Date: 09/18/2016 CLINICAL DATA:  Dementia patient post unwitnessed fall landing on left side. Struck head on bed. EXAM: CT HEAD WITHOUT CONTRAST CT CERVICAL SPINE WITHOUT CONTRAST TECHNIQUE: Multidetector CT imaging of the head and cervical spine was performed following the standard protocol without intravenous contrast. Multiplanar CT image reconstructions of the cervical spine were also generated. COMPARISON:  Head CT 02/21/2014 FINDINGS: CT HEAD FINDINGS Brain: No evidence of acute infarction, hemorrhage, hydrocephalus, extra-axial collection or mass lesion/mass effect. Stable ventriculomegaly and ventricular morphology.  Stable advanced chronic small vessel ischemia. Remote lacunar infarct in the right caudate. Vascular: Atherosclerosis of skullbase vasculature without hyperdense vessel or abnormal calcification. Skull: No skull fracture.  No focal lesion. Sinuses/Orbits: Paranasal sinuses and mastoid air cells are clear. The visualized orbits are unremarkable. Bilateral cataract resection. Other: None. CT CERVICAL SPINE FINDINGS Alignment: No traumatic malalignment. Minimal anterolisthesis of C7 on T1, retrolisthesis of C5 on C6, and anterolisthesis of C3 on C4 appears degenerative. This at the normally aligned. Skull base and vertebrae: No acute fracture. Skullbase and dens are intact. Vertebral body marrow heterogeneity likely secondary to osteopenia. No discrete focal lesion. Soft tissues and spinal canal: No prevertebral fluid or swelling. No visible canal hematoma. Disc levels: Diffuse disc space narrowing and endplate spurring, most prominent at C5-C6. Multilevel facet arthropathy. Upper chest: No acute abnormality. Atherosclerosis of the visualized thoracic aorta. Other: Carotid calcifications. IMPRESSION: 1. No acute intracranial abnormality. No skull fracture. Stable chronic change. 2. Multilevel degenerative change in the cervical spine without acute fracture or subluxation. Electronically Signed   By: Jeb Levering M.D.   On: 09/18/2016 23:57   Dg Chest Port 1 View  Result Date: 09/18/2016 CLINICAL DATA:  Fall at home with open ankle fracture. EXAM: PORTABLE CHEST 1 VIEW COMPARISON:  Radiograph 02/21/2014 FINDINGS: Low lung volumes limit assessment. Mild cardiomegaly. Normal mediastinal contours. No pulmonary edema. Possible blunting of left costophrenic angle versus soft tissue attenuation. No consolidation or pneumothorax. Postsurgical change in the lower thoracic and lumbar spine, partially included. No acute osseous abnormalities are seen. The bones appear under mineralized. IMPRESSION: Low lung volumes with  mild cardiomegaly. Possible small left pleural effusion. Electronically Signed   By: Jeb Levering M.D.   On: 09/18/2016 22:52   Dg Ankle Left Port  Result Date: 09/19/2016 CLINICAL DATA:  Fracture, postreduction. EXAM: PORTABLE LEFT ANKLE - 2 VIEW COMPARISON:  Pre reduction radiographs yesterday. FINDINGS: Improved alignment of fracture subluxation of the left ankle postreduction. Tibial talar alignment is improved and near anatomic. Decrease displacement of medial malleolar and distal fibular fractures. Posterior tibial tubercle fracture is tentatively identified. Overlying splint material in place. IMPRESSION: Improved alignment postreduction with decrease displacement of medial malleolar and distal fibular fractures in improved tibial talar alignment. Question of posterior tibial tubercle fracture. Electronically Signed   By: Jeb Levering M.D.   On: 09/19/2016 01:08   Dg Ankle Left Port  Result Date: 09/18/2016 CLINICAL DATA:  Fall at nursing home with ankle deformity. EXAM: PORTABLE LEFT ANKLE - 2 VIEW  COMPARISON:  None. FINDINGS: Ankle fracture dislocation. The talus is displaced laterally with apex medial angulation with respect to the tibial plafond. Displaced transverse medial malleolar fracture with distal fragment remaining aligned with the talus. Oblique distal fibular fracture with angulation, distal fragment remains aligned with the talus. No definite posterior tibial tubercle fracture. IMPRESSION: Bimalleolar ankle fracture dislocation. Lateral displacement of the talus with fractures of the medial malleolus and distal fibula, distal fracture fragment remain aligned with the talus. No definite posterior tibial tubercle fracture is seen. Electronically Signed   By: Jeb Levering M.D.   On: 09/18/2016 22:56    ASSESSMENT: left ankle bimalleolar fracture  PLAN: OR for left ankle ORIF    Kinser Fellman,STEPHEN D 09/19/2016. 8:12 AM

## 2016-09-19 NOTE — Progress Notes (Signed)
This RT called to pt bedside to assist with conscious sedation procedure.  Pt was found on 4lnc, HR97, rr18, spo2 96%.  ETCO2 22.  Pt was bagged on 15L o2 when breaths became shallow and spo2 dropped to mid-high 80s during procedure.  Pt tolerated procedure well without incident.  Pt now spontaneously breathing adequately on 4lnc.  HR90, rr18, spo2 100%.

## 2016-09-19 NOTE — Progress Notes (Signed)
Pt son Lanny Hurst called and asked to confirm left ankle was the correct ankle. Verbal consent received from Myself and [place] Jones< Rn

## 2016-09-19 NOTE — Transfer of Care (Signed)
Immediate Anesthesia Transfer of Care Note  Patient: [name]  Procedure(s) Performed: Procedure(s): OPEN REDUCTION INTERNAL FIXATION (ORIF) ANKLE FRACTURE (Left)  Patient Location: PACU  Anesthesia Type:General  Level of Consciousness: awake and patient cooperative  Airway & Oxygen Therapy: Patient Spontanous Breathing and Patient connected to nasal cannula oxygen  Post-op Assessment: Report given to RN and Post -op Vital signs reviewed and stable  Post vital signs: Reviewed and stable  Last Vitals:  Vitals:   09/19/16 0300 09/19/16 1456  BP: 120/68   Pulse: 90   Resp:    Temp: 36.8 C (!) 36.2 C    Last Pain:  Vitals:   09/19/16 0742  TempSrc:   PainSc: Asleep         Complications: No apparent anesthesia complications

## 2016-09-19 NOTE — ED Notes (Signed)
[facility] has transported the patient to [facility]. Belongings transported with patient.

## 2016-09-19 NOTE — Social Work (Signed)
CSW contacted [facility] and was advised that patient from their ALF. The facility can provide short term rehab once patient is medically stable and ready for DC. CSW can f/u for assistance with Joelene Millin or Hoyle Sauer, nurse on the floor of [facility] [employer].  CSW will continue to follow.  Elissa Hefty, LCSW Clinical Social Worker [phone]

## 2016-09-19 NOTE — Progress Notes (Signed)
[name] 83 year old female with a pmh of HTN, HLD, Alz dementia, PVD, RA, and osteoporosis; who presents after having a witnessed fall with open left ankle bimalleolar fracture. CT imaging shows signs of a remote lacunar infarct although none is noted in her medical record. The geriatric sensitive preoperative cardiac risk assessment score appears to be 6.3%.

## 2016-09-20 ENCOUNTER — Telehealth: Payer: Self-pay

## 2016-09-20 NOTE — Telephone Encounter (Signed)
Possible re-admission to facility. This is a patient you were seeing at [facility] F/U is needed if patient was re-admitted to facility upon discharge. Hospital discharge from [facility] on 09/20/2016

## 2016-09-21 DIAGNOSIS — W06XXXA Fall from bed, initial encounter: Secondary | ICD-10-CM | POA: Diagnosis not present

## 2016-09-21 DIAGNOSIS — T83091A Other mechanical complication of indwelling urethral catheter, initial encounter: Secondary | ICD-10-CM | POA: Diagnosis not present

## 2016-09-21 DIAGNOSIS — R339 Retention of urine, unspecified: Secondary | ICD-10-CM | POA: Diagnosis not present

## 2016-09-21 DIAGNOSIS — H1033 Unspecified acute conjunctivitis, bilateral: Secondary | ICD-10-CM | POA: Diagnosis not present

## 2016-09-21 DIAGNOSIS — D500 Iron deficiency anemia secondary to blood loss (chronic): Secondary | ICD-10-CM | POA: Diagnosis not present

## 2016-09-21 DIAGNOSIS — R338 Other retention of urine: Secondary | ICD-10-CM | POA: Diagnosis not present

## 2016-09-21 DIAGNOSIS — S9305XA Dislocation of left ankle joint, initial encounter: Secondary | ICD-10-CM | POA: Diagnosis not present

## 2016-09-21 DIAGNOSIS — S92909A Unspecified fracture of unspecified foot, initial encounter for closed fracture: Secondary | ICD-10-CM | POA: Diagnosis not present

## 2016-09-21 DIAGNOSIS — Z967 Presence of other bone and tendon implants: Secondary | ICD-10-CM | POA: Diagnosis not present

## 2016-09-21 DIAGNOSIS — F0280 Dementia in other diseases classified elsewhere without behavioral disturbance: Secondary | ICD-10-CM | POA: Diagnosis not present

## 2016-09-21 DIAGNOSIS — K219 Gastro-esophageal reflux disease without esophagitis: Secondary | ICD-10-CM | POA: Diagnosis not present

## 2016-09-21 DIAGNOSIS — M6281 Muscle weakness (generalized): Secondary | ICD-10-CM | POA: Diagnosis not present

## 2016-09-21 DIAGNOSIS — N390 Urinary tract infection, site not specified: Secondary | ICD-10-CM | POA: Diagnosis not present

## 2016-09-21 DIAGNOSIS — R600 Localized edema: Secondary | ICD-10-CM | POA: Diagnosis not present

## 2016-09-21 DIAGNOSIS — D72829 Elevated white blood cell count, unspecified: Secondary | ICD-10-CM | POA: Diagnosis not present

## 2016-09-21 DIAGNOSIS — K5900 Constipation, unspecified: Secondary | ICD-10-CM | POA: Diagnosis not present

## 2016-09-21 DIAGNOSIS — Z79899 Other long term (current) drug therapy: Secondary | ICD-10-CM | POA: Diagnosis not present

## 2016-09-21 DIAGNOSIS — R1084 Generalized abdominal pain: Secondary | ICD-10-CM | POA: Diagnosis not present

## 2016-09-21 DIAGNOSIS — S8002XA Contusion of left knee, initial encounter: Secondary | ICD-10-CM | POA: Diagnosis not present

## 2016-09-21 DIAGNOSIS — Z4789 Encounter for other orthopedic aftercare: Secondary | ICD-10-CM | POA: Diagnosis not present

## 2016-09-21 DIAGNOSIS — R262 Difficulty in walking, not elsewhere classified: Secondary | ICD-10-CM | POA: Diagnosis not present

## 2016-09-21 DIAGNOSIS — Z7901 Long term (current) use of anticoagulants: Secondary | ICD-10-CM | POA: Diagnosis not present

## 2016-09-21 DIAGNOSIS — R410 Disorientation, unspecified: Secondary | ICD-10-CM | POA: Diagnosis not present

## 2016-09-21 DIAGNOSIS — I272 Pulmonary hypertension, unspecified: Secondary | ICD-10-CM | POA: Diagnosis not present

## 2016-09-21 DIAGNOSIS — M25572 Pain in left ankle and joints of left foot: Secondary | ICD-10-CM | POA: Diagnosis not present

## 2016-09-21 DIAGNOSIS — Y92122 Bedroom in nursing home as the place of occurrence of the external cause: Secondary | ICD-10-CM | POA: Diagnosis not present

## 2016-09-21 DIAGNOSIS — L03116 Cellulitis of left lower limb: Secondary | ICD-10-CM | POA: Diagnosis not present

## 2016-09-21 DIAGNOSIS — S82892S Other fracture of left lower leg, sequela: Secondary | ICD-10-CM | POA: Diagnosis not present

## 2016-09-21 DIAGNOSIS — S82852D Displaced trimalleolar fracture of left lower leg, subsequent encounter for closed fracture with routine healing: Secondary | ICD-10-CM | POA: Diagnosis not present

## 2016-09-21 DIAGNOSIS — R4789 Other speech disturbances: Secondary | ICD-10-CM | POA: Diagnosis not present

## 2016-09-21 DIAGNOSIS — E785 Hyperlipidemia, unspecified: Secondary | ICD-10-CM | POA: Diagnosis not present

## 2016-09-21 DIAGNOSIS — G309 Alzheimer's disease, unspecified: Secondary | ICD-10-CM | POA: Diagnosis not present

## 2016-09-21 DIAGNOSIS — R2681 Unsteadiness on feet: Secondary | ICD-10-CM | POA: Diagnosis not present

## 2016-09-21 DIAGNOSIS — R296 Repeated falls: Secondary | ICD-10-CM | POA: Diagnosis not present

## 2016-09-21 DIAGNOSIS — I10 Essential (primary) hypertension: Secondary | ICD-10-CM | POA: Diagnosis not present

## 2016-09-21 DIAGNOSIS — S82842B Displaced bimalleolar fracture of left lower leg, initial encounter for open fracture type I or II: Secondary | ICD-10-CM | POA: Diagnosis not present

## 2016-09-21 DIAGNOSIS — S82892E Other fracture of left lower leg, subsequent encounter for open fracture type I or II with routine healing: Secondary | ICD-10-CM | POA: Diagnosis not present

## 2016-09-21 DIAGNOSIS — Z9181 History of falling: Secondary | ICD-10-CM | POA: Diagnosis not present

## 2016-09-21 DIAGNOSIS — M7989 Other specified soft tissue disorders: Secondary | ICD-10-CM | POA: Diagnosis not present

## 2016-09-21 DIAGNOSIS — S51812A Laceration without foreign body of left forearm, initial encounter: Secondary | ICD-10-CM | POA: Diagnosis not present

## 2016-09-21 DIAGNOSIS — Z23 Encounter for immunization: Secondary | ICD-10-CM | POA: Diagnosis not present

## 2016-09-21 DIAGNOSIS — G301 Alzheimer's disease with late onset: Secondary | ICD-10-CM | POA: Diagnosis not present

## 2016-09-21 DIAGNOSIS — M069 Rheumatoid arthritis, unspecified: Secondary | ICD-10-CM | POA: Diagnosis not present

## 2016-09-21 DIAGNOSIS — N3000 Acute cystitis without hematuria: Secondary | ICD-10-CM | POA: Diagnosis not present

## 2016-09-21 DIAGNOSIS — L03115 Cellulitis of right lower limb: Secondary | ICD-10-CM | POA: Diagnosis not present

## 2016-09-21 MED ORDER — ENOXAPARIN SODIUM 30 MG/0.3ML ~~LOC~~ SOLN: 30.0000 mg | SUBCUTANEOUS | 0 refills | Status: DC

## 2016-09-21 MED ORDER — HYDROCODONE-ACETAMINOPHEN 5-325 MG PO TABS: 1.0000 | ORAL_TABLET | Freq: Four times a day (QID) | ORAL | 0 refills | Status: DC | PRN

## 2016-09-21 NOTE — Progress Notes (Signed)
Discharge instructions printed and reviewed with patient and family, and copy given for them to take home. All questions addressed at this time. New prescriptions given to family member to give to nursing staff at [facility]. IV's removed. Room searched for patient belongings, and confirmed with patient that all valuables were accounted for. Family assisted patient to dress. Awaiting [facility] for discharge transport to facility. Called report to nurse McKayla at [facility]. Reviewed HPI, PMH, most recent vitals, follow up needs, and information regarding patient's diet and weight bearing restrictions. Will continue to monitor until time of discharge.

## 2016-09-21 NOTE — Clinical Social Work Placement (Addendum)
   CLINICAL SOCIAL WORK PLACEMENT  NOTE  Date:  09/21/2016  Patient Details  Name: [name] MRN: [id number] Date of Birth: [birth year]  Clinical Social Work is seeking post-discharge placement for this patient at the [place] level of care (*CSW will initial, date and re-position this form in  chart as items are completed):  Yes   Patient/family provided with [place] Work Department's list of facilities offering this level of care within the geographic area requested by the patient (or if unable, by the patient's family).  Yes   Patient/family informed of their freedom to choose among providers that offer the needed level of care, that participate in Medicare, Medicaid or managed care program needed by the patient, have an available bed and are willing to accept the patient.  Yes   Patient/family informed of 's ownership interest in [facility] and [facility], as well as of the fact that they are under no obligation to receive care at these facilities.  PASRR submitted to EDS on       PASRR number received on 09/20/16     Existing PASRR number confirmed on 09/20/16     FL2 transmitted to all facilities in geographic area requested by pt/family on 09/20/16     FL2 transmitted to all facilities within larger geographic area on 09/20/16     Patient informed that his/her managed care company has contracts with or will negotiate with certain facilities, including the following:        Yes   Patient/family informed of bed offers received.  Patient chooses bed at [facility]     Physician recommends and patient chooses bed at      Patient to be transferred to [facility] on 09/21/16.  Patient to be transferred to facility by [facility]     Patient family notified on 09/21/16 of transfer.  Name of family member notified:  Daughter at bedside     PHYSICIAN Please prepare priority discharge summary, including  medications, Please prepare prescriptions, Please sign FL2     Additional Comment:    _______________________________________________ Normajean Baxter, LCSW 09/21/2016, 10:45 AM

## 2016-09-21 NOTE — Discharge Summary (Signed)
SPORTS MEDICINE & JOINT REPLACEMENT   Brooke Mulch, MD   Brooke Shadow, PA-C [place], [place],   [address]                             [phone]  PATIENT ID: [name]        MRN:  [id number]          DOB/AGE: Apr 17, 1924 / 83 y.o.    DISCHARGE SUMMARY  ADMISSION DATE:    09/18/2016 DISCHARGE DATE:   09/21/2016   ADMISSION DIAGNOSIS: Fracture [J09.8XXA] Pain [R52] Open dislocation of left ankle, initial encounter [S93.05XA, S91.002A] Type I or II open bimalleolar fracture of left ankle, initial encounter [S82.842B] Open left ankle fracture [S82.892B]    DISCHARGE DIAGNOSIS:  Left ankle fracture    ADDITIONAL DIAGNOSIS: Active Problems:   Open left ankle fracture  Past Medical History:  Diagnosis Date  . 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 Anemia    iron deficient  . Anemia   . Anxiety   . Chronic airway obstruction, not elsewhere classified   . Chronic back pain   . Chronic cystitis   . Cystitis, chronic    Dr Gaynelle Arabian  . Degenerative arthritis   . Depression   . Disturbance of skin sensation   . Disturbance of skin sensation   . Dyslipidemia   . Dyspnea    Dr Chase Caller  . Esophageal reflux   . Essential and other specified forms of tremor 12/11/2012  . Foot drop, bilateral 12/21/2013  . Gait disorder   . GERD (gastroesophageal reflux disease)   . Hemorrhoids   . Hereditary and idiopathic peripheral neuropathy 10/11/2015  . Hernia   . History of cerebrovascular disease    Moderate level small vessel disease  . Hyperlipidemia    mild  . Hypertension   . Osteoarthritis    right knee injection per Dr Trudie Reed  . Osteoporosis   . Other abnormal clinical finding   . Pain in joint, upper arm   . Peripheral vascular disease ([place])   . Rheumatoid arthritis ([place])    Dr Trudie Reed  . Tobacco use disorder   . Tremor    and gait disorder--Dr Jannifer Franklin  . Unspecified vitamin D deficiency     PROCEDURE: Procedure(s): OPEN REDUCTION INTERNAL FIXATION  (ORIF) ANKLE FRACTURE on 09/18/2016 - 09/19/2016  CONSULTS:    HISTORY:  See H&P in chart  HOSPITAL COURSE:  [name] is a 83 y.o. admitted on 09/18/2016 and found to have a diagnosis of Left ankle fracture.  After appropriate laboratory studies were obtained  they were taken to the operating room on 09/18/2016 - 09/19/2016 and underwent Procedure(s): OPEN REDUCTION INTERNAL FIXATION (ORIF) ANKLE FRACTURE.   They were given perioperative antibiotics:  Anti-infectives    Start     Dose/Rate Route Frequency Ordered Stop   09/19/16 1930  cephALEXin (KEFLEX) capsule 500 mg  Status:  Discontinued     500 mg Oral 4 times daily 09/19/16 1917 09/19/16 1917   09/19/16 1315  ceFAZolin (ANCEF) IVPB 2g/100 mL premix     2 g 200 mL/hr over 30 Minutes Intravenous On call to O.R. 09/19/16 1302 09/19/16 1335   09/19/16 1303  ceFAZolin (ANCEF) 2-4 GM/100ML-% IVPB    Comments:  Connye Burkitt   : cabinet override      09/19/16 1303 09/19/16 1335   09/18/16 2230  ceFAZolin (ANCEF) IVPB 1 g/50 mL premix  1 g 100 mL/hr over 30 Minutes Intravenous  Once 09/18/16 2222 09/18/16 2320    .  Patient given tranexamic acid IV or topical and exparel intra-operatively.  Tolerated the procedure well.    POD# 1: Vital signs were stable.  Patient denied Chest pain, shortness of breath, or calf pain.  Patient was started on Lovenox 30 mg subcutaneously twice daily at 8am.  Consults to PT, OT, and care management were made.  The patient was weight bearing as tolerated.  CPM was placed on the operative leg 0-90 degrees for 6-8 hours a day. When out of the CPM, patient was placed in the foam block to achieve full extension. Incentive spirometry was taught.  Dressing was changed.       POD #2, Continued  PT for ambulation and exercise program.  IV saline locked.  O2 discontinued.    The remainder of the hospital course was dedicated to ambulation and strengthening.   The patient was discharged on 2 Days Post-Op in  Good  condition.  Blood products given:none  DIAGNOSTIC STUDIES: Recent vital signs: Patient Vitals for the past 24 hrs:  BP Temp Temp src Pulse Resp SpO2  09/21/16 0509 (!) 149/54 98.3 F (36.8 C) Oral 95 - 93 %  09/20/16 2105 138/74 98.3 F (36.8 C) Oral 99 - 92 %  09/20/16 1700 122/66 98.3 F (36.8 C) Oral 83 18 98 %       Recent laboratory studies:  Recent Labs  09/18/16 2232 09/18/16 2239  WBC 11.9*  --   HGB 13.0 13.3  HCT 39.5 39.0  PLT 358  --     Recent Labs  09/18/16 2232 09/18/16 2239  NA 137 136  K 4.6 4.5  CL 99* 96*  CO2 33*  --   BUN 24* 30*  CREATININE 1.06* 1.00  GLUCOSE 115* 110*  CALCIUM 9.4  --    Lab Results  Component Value Date   INR 1.12 02/21/2014   INR 1.1 07/14/2007     Recent Radiographic Studies :  Dg Ankle 2 Views Left  Result Date: 09/19/2016 CLINICAL DATA:  Left ankle fracture fixation. EXAM: DG C-ARM 61-120 MIN; LEFT ANKLE - 2 VIEW COMPARISON:  09/19/2016 FINDINGS: Intraoperative fluoroscopic images from fixation of medial and lateral malleolar fractures demonstrate sideplate and screw fixation of the distal fibula and cancellous screw fixation of the medial malleolus. The alignment is anatomic. No new fractures are seen. Soft tissue swelling. IMPRESSION: Intraoperative fluoroscopic images from fixation of the distal tibia and fibula. Fluoroscopy time 30 seconds. Electronically Signed   By: Fidela Salisbury M.D.   On: 09/19/2016 17:06   Ct Head Wo Contrast  Result Date: 09/18/2016 CLINICAL DATA:  Dementia patient post unwitnessed fall landing on left side. Struck head on bed. EXAM: CT HEAD WITHOUT CONTRAST CT CERVICAL SPINE WITHOUT CONTRAST TECHNIQUE: Multidetector CT imaging of the head and cervical spine was performed following the standard protocol without intravenous contrast. Multiplanar CT image reconstructions of the cervical spine were also generated. COMPARISON:  Head CT 02/21/2014 FINDINGS: CT HEAD FINDINGS Brain: No evidence  of acute infarction, hemorrhage, hydrocephalus, extra-axial collection or mass lesion/mass effect. Stable ventriculomegaly and ventricular morphology. Stable advanced chronic small vessel ischemia. Remote lacunar infarct in the right caudate. Vascular: Atherosclerosis of skullbase vasculature without hyperdense vessel or abnormal calcification. Skull: No skull fracture.  No focal lesion. Sinuses/Orbits: Paranasal sinuses and mastoid air cells are clear. The visualized orbits are unremarkable. Bilateral cataract resection. Other: None. CT CERVICAL  SPINE FINDINGS Alignment: No traumatic malalignment. Minimal anterolisthesis of C7 on T1, retrolisthesis of C5 on C6, and anterolisthesis of C3 on C4 appears degenerative. This at the normally aligned. Skull base and vertebrae: No acute fracture. Skullbase and dens are intact. Vertebral body marrow heterogeneity likely secondary to osteopenia. No discrete focal lesion. Soft tissues and spinal canal: No prevertebral fluid or swelling. No visible canal hematoma. Disc levels: Diffuse disc space narrowing and endplate spurring, most prominent at C5-C6. Multilevel facet arthropathy. Upper chest: No acute abnormality. Atherosclerosis of the visualized thoracic aorta. Other: Carotid calcifications. IMPRESSION: 1. No acute intracranial abnormality. No skull fracture. Stable chronic change. 2. Multilevel degenerative change in the cervical spine without acute fracture or subluxation. Electronically Signed   By: Jeb Levering M.D.   On: 09/18/2016 23:57   Ct Cervical Spine Wo Contrast  Result Date: 09/18/2016 CLINICAL DATA:  Dementia patient post unwitnessed fall landing on left side. Struck head on bed. EXAM: CT HEAD WITHOUT CONTRAST CT CERVICAL SPINE WITHOUT CONTRAST TECHNIQUE: Multidetector CT imaging of the head and cervical spine was performed following the standard protocol without intravenous contrast. Multiplanar CT image reconstructions of the cervical spine were also  generated. COMPARISON:  Head CT 02/21/2014 FINDINGS: CT HEAD FINDINGS Brain: No evidence of acute infarction, hemorrhage, hydrocephalus, extra-axial collection or mass lesion/mass effect. Stable ventriculomegaly and ventricular morphology. Stable advanced chronic small vessel ischemia. Remote lacunar infarct in the right caudate. Vascular: Atherosclerosis of skullbase vasculature without hyperdense vessel or abnormal calcification. Skull: No skull fracture.  No focal lesion. Sinuses/Orbits: Paranasal sinuses and mastoid air cells are clear. The visualized orbits are unremarkable. Bilateral cataract resection. Other: None. CT CERVICAL SPINE FINDINGS Alignment: No traumatic malalignment. Minimal anterolisthesis of C7 on T1, retrolisthesis of C5 on C6, and anterolisthesis of C3 on C4 appears degenerative. This at the normally aligned. Skull base and vertebrae: No acute fracture. Skullbase and dens are intact. Vertebral body marrow heterogeneity likely secondary to osteopenia. No discrete focal lesion. Soft tissues and spinal canal: No prevertebral fluid or swelling. No visible canal hematoma. Disc levels: Diffuse disc space narrowing and endplate spurring, most prominent at C5-C6. Multilevel facet arthropathy. Upper chest: No acute abnormality. Atherosclerosis of the visualized thoracic aorta. Other: Carotid calcifications. IMPRESSION: 1. No acute intracranial abnormality. No skull fracture. Stable chronic change. 2. Multilevel degenerative change in the cervical spine without acute fracture or subluxation. Electronically Signed   By: Jeb Levering M.D.   On: 09/18/2016 23:57   Dg Chest Port 1 View  Result Date: 09/18/2016 CLINICAL DATA:  Fall at home with open ankle fracture. EXAM: PORTABLE CHEST 1 VIEW COMPARISON:  Radiograph 02/21/2014 FINDINGS: Low lung volumes limit assessment. Mild cardiomegaly. Normal mediastinal contours. No pulmonary edema. Possible blunting of left costophrenic angle versus soft tissue  attenuation. No consolidation or pneumothorax. Postsurgical change in the lower thoracic and lumbar spine, partially included. No acute osseous abnormalities are seen. The bones appear under mineralized. IMPRESSION: Low lung volumes with mild cardiomegaly. Possible small left pleural effusion. Electronically Signed   By: Jeb Levering M.D.   On: 09/18/2016 22:52   Dg Ankle Left Port  Result Date: 09/19/2016 CLINICAL DATA:  Fracture, postreduction. EXAM: PORTABLE LEFT ANKLE - 2 VIEW COMPARISON:  Pre reduction radiographs yesterday. FINDINGS: Improved alignment of fracture subluxation of the left ankle postreduction. Tibial talar alignment is improved and near anatomic. Decrease displacement of medial malleolar and distal fibular fractures. Posterior tibial tubercle fracture is tentatively identified. Overlying splint material in place. IMPRESSION: Improved  alignment postreduction with decrease displacement of medial malleolar and distal fibular fractures in improved tibial talar alignment. Question of posterior tibial tubercle fracture. Electronically Signed   By: Jeb Levering M.D.   On: 09/19/2016 01:08   Dg Ankle Left Port  Result Date: 09/18/2016 CLINICAL DATA:  Fall at nursing home with ankle deformity. EXAM: PORTABLE LEFT ANKLE - 2 VIEW COMPARISON:  None. FINDINGS: Ankle fracture dislocation. The talus is displaced laterally with apex medial angulation with respect to the tibial plafond. Displaced transverse medial malleolar fracture with distal fragment remaining aligned with the talus. Oblique distal fibular fracture with angulation, distal fragment remains aligned with the talus. No definite posterior tibial tubercle fracture. IMPRESSION: Bimalleolar ankle fracture dislocation. Lateral displacement of the talus with fractures of the medial malleolus and distal fibula, distal fracture fragment remain aligned with the talus. No definite posterior tibial tubercle fracture is seen. Electronically  Signed   By: Jeb Levering M.D.   On: 09/18/2016 22:56   Dg C-arm 1-60 Min  Result Date: 09/19/2016 CLINICAL DATA:  Left ankle fracture fixation. EXAM: DG C-ARM 61-120 MIN; LEFT ANKLE - 2 VIEW COMPARISON:  09/19/2016 FINDINGS: Intraoperative fluoroscopic images from fixation of medial and lateral malleolar fractures demonstrate sideplate and screw fixation of the distal fibula and cancellous screw fixation of the medial malleolus. The alignment is anatomic. No new fractures are seen. Soft tissue swelling. IMPRESSION: Intraoperative fluoroscopic images from fixation of the distal tibia and fibula. Fluoroscopy time 30 seconds. Electronically Signed   By: Fidela Salisbury M.D.   On: 09/19/2016 17:06    DISCHARGE INSTRUCTIONS: Discharge Instructions    Call MD / Call 911    Complete by:  As directed    If you experience chest pain or shortness of breath, CALL 911 and be transported to the hospital emergency room.  If you develope a fever above 101 F, pus (white drainage) or increased drainage or redness at the wound, or calf pain, call your surgeon's office.   Constipation Prevention    Complete by:  As directed    Drink plenty of fluids.  Prune juice may be helpful.  You may use a stool softener, such as Colace (over the counter) 100 mg twice a day.  Use MiraLax (over the counter) for constipation as needed.   Diet - low sodium heart healthy    Complete by:  As directed    Discharge instructions    Complete by:  As directed    Keep wound clean/dry Rest, ice, elevation to left leg Non weight bearing in the cast Resume home medication Follow up in the office in 2 weeks   Increase activity slowly as tolerated    Complete by:  As directed       DISCHARGE MEDICATIONS:   Allergies as of 09/21/2016      Reactions   Novocain [procaine]    Passed  out   Limbrel [flavocoxid]    Dizziness   Sertraline Anxiety      Medication List    TAKE these medications   acetaminophen 650 MG CR  tablet Commonly known as:  TYLENOL Take 650 mg by mouth 2 (two) times daily. Take q4h prn for fever   alum & mag hydroxide-simeth 200-200-20 MG/5ML suspension Commonly known as:  MAALOX/MYLANTA Take 30 mLs by mouth every 4 (four) hours as needed for indigestion or heartburn.   CALTRATE 600+D 600-400 MG-UNIT tablet Generic drug:  Calcium Carbonate-Vitamin D Take 1 tablet by mouth daily.   cephALEXin  500 MG capsule Commonly known as:  KEFLEX Take 500 mg by mouth 4 (four) times daily.   enoxaparin 30 MG/0.3ML injection Commonly known as:  LOVENOX Inject 0.3 mLs (30 mg total) into the skin daily.   furosemide 20 MG tablet Commonly known as:  LASIX Take 20 mg by mouth 2 (two) times daily.   HYDROcodone-acetaminophen 5-325 MG tablet Commonly known as:  NORCO/VICODIN Take 1 tablet by mouth every 6 (six) hours as needed for moderate pain.   LEVSIN/SL 0.125 MG Subl Generic drug:  Hyoscyamine Sulfate SL Place under the tongue every 12 (twelve) hours as needed (for cramps).   memantine 10 MG tablet Commonly known as:  NAMENDA Take 10 mg by mouth 2 (two) times daily.   multivitamin tablet Take 1 tablet by mouth daily.   OSTEO BI-FLEX TRIPLE STRENGTH Tabs Take 1 tablet by mouth daily.   ranitidine 150 MG tablet Commonly known as:  ZANTAC Take 150 mg by mouth 2 (two) times daily.   RESOURCE 2.0 Liqd Take by mouth. 2ml twice daily   saccharomyces boulardii 250 MG capsule Commonly known as:  FLORASTOR Take 250 mg by mouth 2 (two) times daily.   sennosides-docusate sodium 8.6-50 MG tablet Commonly known as:  SENOKOT-S Take 2 tablets by mouth daily.   Vitamin D3 10000 units Tabs Take 1 tablet by mouth daily.       FOLLOW UP VISIT:    DISPOSITION: HOME VS. SNF  CONDITION:  Good   Donia Ast 09/21/2016, 8:51 AM

## 2016-09-21 NOTE — Progress Notes (Signed)
Orthopedic Tech Progress Note Patient Details:  [name] [birth year] [id number]  Casting Type of Cast: Short leg cast Cast Location: lle Cast Material: Fiberglass Cast Intervention: Application Viewed order from doctor's order list    Hildred Priest 09/21/2016, 1:32 PM

## 2016-09-21 NOTE — NC FL2 (Signed)
[place] MEDICAID FL2 LEVEL OF CARE SCREENING TOOL     IDENTIFICATION  Patient Name: [name] Birthdate: 20-Apr-1924 Sex: female Admission Date (Current Location): 09/18/2016  [facility] and [place] Number:  Herbalist and Address:  [facility] [place]. [facility], [place] [address], [place], [place] [address]      Provider Number: [id number]  Attending Physician Name and Address:  Vickey Huger, MD  Relative Name and Phone Number:       Current Level of Care: Hospital Recommended Level of Care: Nursing Facility Prior Approval Number:    Date Approved/Denied: 09/20/16 PASRR Number: [id number]  Discharge Plan: SNF    Current Diagnoses: Patient Active Problem List   Diagnosis Date Noted  . Open left ankle fracture 09/19/2016  . Internal hemorrhoids 11/16/2015  . Edema 11/16/2015  . Hereditary and idiopathic peripheral neuropathy 10/11/2015  . Alzheimer's disease 06/22/2015  . Osteopenia, senile 04/15/2014  . UTI (urinary tract infection) 04/01/2014  . Constipation 02/25/2014  . Small vessel disease, cerebrovascular 02/25/2014  . GERD (gastroesophageal reflux disease) 02/25/2014  . Heart murmur on physical examination 02/25/2014  . Fracture, subtrochanteric, right femur, closed ([place])   . Depression   . Gait disorder 12/21/2013  . Foot drop, bilateral 12/21/2013  . Benign essential tremor 12/11/2012  . HYPERTENSION, PULMONARY 03/30/2010    Orientation RESPIRATION BLADDER Height & Weight     Self, Place  O2 (Nasal Cannula) Incontinent Weight:   Height:     BEHAVIORAL SYMPTOMS/MOOD NEUROLOGICAL BOWEL NUTRITION STATUS      Continent Diet  AMBULATORY STATUS COMMUNICATION OF NEEDS Skin   Extensive Assist Verbally Surgical wounds (Left Leg Closed Incision, Compression Wrap)                       Personal Care Assistance Level of Assistance  Bathing, Feeding, Dressing Bathing Assistance: Maximum assistance Feeding assistance: Limited  assistance Dressing Assistance: Maximum assistance     Functional Limitations Info             SPECIAL CARE FACTORS FREQUENCY  PT (By licensed PT), OT (By licensed OT)                    Contractures      Additional Factors Info  Code Status, Allergies Code Status Info: Full Code Allergies Info: NOVOCAIN PROCAINE, LIMBREL FLAVOCOXID, SERTRALINE            Current Medications (09/21/2016):  This is the current hospital active medication list Current Facility-Administered Medications  Medication Dose Route Frequency Provider Last Rate Last Dose  . acetaminophen (TYLENOL) tablet 650 mg  650 mg Oral Q6H PRN Donia Ast, [place]   650 mg at 09/21/16 5621   Or  . acetaminophen (TYLENOL) suppository 650 mg  650 mg Rectal Q6H PRN Donia Ast, PA      . acetaminophen (TYLENOL) tablet 1,000 mg  1,000 mg Oral Q6H Donia Ast, [place]   Stopped at 09/21/16 0630  . alum & mag hydroxide-simeth (MAALOX/MYLANTA) 200-200-20 MG/5ML suspension 30 mL  30 mL Oral Q4H PRN Chadwell, Joshua, PA-C      . bisacodyl (DULCOLAX) EC tablet 5 mg  5 mg Oral Daily PRN Donia Ast, PA      . diphenhydrAMINE (BENADRYL) 12.5 MG/5ML elixir 12.5-25 mg  12.5-25 mg Oral Q4H PRN Donia Ast, PA      . docusate sodium (COLACE) capsule 100 mg  100 mg Oral BID Unk Lightning,  Enedina Finner, PA   100 mg at 09/21/16 0936  . enoxaparin (LOVENOX) injection 30 mg  30 mg Subcutaneous Q24H Carlyon [place], [place]   30 mg at 09/21/16 0249  . famotidine (PEPCID) tablet 20 mg  20 mg Oral Daily Chadwell, Joshua, PA-C   20 mg at 09/21/16 0936  . furosemide (LASIX) tablet 20 mg  20 mg Oral BID Chadwell, Joshua, PA-C   20 mg at 09/21/16 0935  . HYDROcodone-acetaminophen (NORCO) 7.5-325 MG per tablet 1 tablet  1 tablet Oral Q6H Donia Ast, [place]   Stopped at 09/21/16 0630  . HYDROmorphone (DILAUDID) injection 0.5 mg  0.5 mg Intravenous Q2H PRN Donia Ast, [place]   0.5 mg at 09/21/16 0314  .  memantine (NAMENDA) tablet 10 mg  10 mg Oral BID Chadwell, Joshua, PA-C   10 mg at 09/21/16 0936  . methocarbamol (ROBAXIN) tablet 500 mg  500 mg Oral Q6H PRN Donia Ast, [place]   500 mg at 09/21/16 3790   Or  . methocarbamol (ROBAXIN) 500 mg in dextrose 5 % 50 mL IVPB  500 mg Intravenous Q6H PRN Donia Ast, [place]      . ondansetron [facility]) tablet 4 mg  4 mg Oral Q6H PRN Donia Ast, PA       Or  . ondansetron [facility]) injection 4 mg  4 mg Intravenous Q6H PRN Donia Ast, [place]      . oxyCODONE (Oxy IR/ROXICODONE) immediate release tablet 5-10 mg  5-10 mg Oral Q3H PRN Donia Ast, PA      . saccharomyces boulardii (FLORASTOR) capsule 250 mg  250 mg Oral BID Chadwell, Joshua, PA-C   250 mg at 09/21/16 0936  . senna-docusate (Senokot-S) tablet 1 tablet  1 tablet Oral QHS PRN Donia Ast, PA      . sodium phosphate (FLEET) 7-19 GM/118ML enema 1 enema  1 enema Rectal Once PRN Donia Ast, PA      . zolpidem [facility]) tablet 5 mg  5 mg Oral QHS PRN Donia Ast, [place]         Discharge Medications: Please see discharge summary for a list of discharge medications.  Relevant Imaging Results:  Relevant Lab Results:   Additional Information SS: [place], LCSW

## 2016-09-21 NOTE — Social Work (Signed)
Clinical Social Worker facilitated patient discharge including contacting patient family and facility to confirm patient discharge plans.  Clinical information faxed to facility and family agreeable with plan.    SW arranged ambulance transport via [facility] to [facility].    RN to call [phone], ask for [place] nurse, to give report prior to discharge.  Clinical Social Worker will sign off for now as social work intervention is no longer needed. Please consult [place] again if new need arises.  Elissa Hefty, LCSW Clinical Social Worker [phone]

## 2016-09-21 NOTE — Progress Notes (Signed)
SPORTS MEDICINE AND JOINT REPLACEMENT  [name], MD    Carlyon Shadow, PA-C [place], [place], [place]  [address]                             [phone]   PROGRESS NOTE  Subjective:  negative for Chest Pain  negative for Shortness of Breath  negative for Nausea/Vomiting   negative for Calf Pain  negative for Bowel Movement   Tolerating Diet: yes         Patient reports pain as 2 on 0-10 scale.    Objective: Vital signs in last 24 hours:   Patient Vitals for the past 24 hrs:  BP Temp Temp src Pulse Resp SpO2  09/21/16 0509 (!) 149/54 98.3 F (36.8 C) Oral 95 - 93 %  09/20/16 2105 138/74 98.3 F (36.8 C) Oral 99 - 92 %  09/20/16 1700 122/66 98.3 F (36.8 C) Oral 83 18 98 %    @flow {1959:LAST@   Intake/Output from previous day:   07/06 0701 - 07/07 0700 In: 200 [P.O.:200] Out: -    Intake/Output this shift:   No intake/output data recorded.   Intake/Output      07/06 0701 - 07/07 0700 07/07 0701 - 07/08 0700   P.O. 200    I.V.     Total Intake 200     Blood     Total Output       Net +200          Urine Occurrence 1 x       LABORATORY DATA:  Recent Labs  09/18/16 2232 09/18/16 2239  WBC 11.9*  --   HGB 13.0 13.3  HCT 39.5 39.0  PLT 358  --     Recent Labs  09/18/16 2232 09/18/16 2239  NA 137 136  K 4.6 4.5  CL 99* 96*  CO2 33*  --   BUN 24* 30*  CREATININE 1.06* 1.00  GLUCOSE 115* 110*  CALCIUM 9.4  --    Lab Results  Component Value Date   INR 1.12 02/21/2014   INR 1.1 07/14/2007    Examination:  General appearance: alert, cooperative and no distress Extremities: extremities normal, atraumatic, no cyanosis or edema  Wound Exam: clean, dry, intact   Drainage:  None: wound tissue dry  Motor Exam: Quadriceps and Hamstrings Intact  Sensory Exam: Superficial Peroneal, Deep Peroneal and Tibial normal   Assessment:    2 Days Post-Op  Procedure(s) (LRB): OPEN REDUCTION INTERNAL FIXATION (ORIF) ANKLE FRACTURE  (Left)  ADDITIONAL DIAGNOSIS:  Active Problems:   Open left ankle fracture     Plan: Physical Therapy as ordered Non Weight Bearing (NWB)  DVT Prophylaxis:  Lovenox  DISCHARGE PLAN: Skilled Nursing Facility/Rehab  Patient doing very well and pain is under control. Will D/C today once short leg cast is applied by orthotec and will follow up in the office in 2 weeks        Donia Ast 09/21/2016, 8:45 AM

## 2016-09-21 NOTE — Clinical Social Work Note (Signed)
Clinical Social Work Assessment  Patient Details  Name: STARNISHA BATREZ MRN: [id number] Date of Birth: 09-27-1924  Date of referral:  09/19/16               Reason for consult:  Facility Placement                Permission sought to share information with:    Permission granted to share information::  Yes, Verbal Permission Granted  Name::        Agency::  SNF- [facility]  Relationship::     Contact Information:     Housing/Transportation Living arrangements for the past 2 months:  [place] of Information:  Facility Patient Interpreter Needed:  None Criminal Activity/Legal Involvement Pertinent to Current Situation/Hospitalization:  No - Comment as needed Significant Relationships:  Other Family Members, Adult Children Lives with:  Facility Resident Do you feel safe going back to the place where you live?  Yes Need for family participation in patient care:  Yes (Comment)  Care giving concerns:  Patient from [facility] and will return at [facility] as that facility will provide skilled nursing needs. CSW spoke with family and they are in agreement with return to facility.  Social Worker ssessment / plan:  CSW spoke with SNF and was advised that patient can return and receive short term rehab as they are a [place]  Employment status:  Retired Forensic scientist:  Medicare PT Recommendations:  [place] / Referral to community resources:  [place]  Patient/Family's Response to care:  Family appreciative of CSW following up with transition back to SNF. No issues or concerns at this time.  Patient/Family's Understanding of and Emotional Response to Diagnosis, Current Treatment, and Prognosis:  Family has good understanding of diagnosis,current treatment and prognosis. No issues or concerns at this time.  Emotional Assessment Appearance:  Appears stated age Attitude/Demeanor/Rapport:    (Pending) Affect (typically observed):   (Pending) Orientation:    Alcohol / Substance use:    Psych involvement (Current and /or in the community):     Discharge Needs  Concerns to be addressed:  Care Coordination Readmission within the last 30 days:  No Current discharge risk:  Dependent with Mobility, Physical Impairment Barriers to Discharge:  No Barriers Identified   Normajean Baxter, LCSW 09/21/2016, 10:44 AM

## 2016-09-23 ENCOUNTER — Non-Acute Institutional Stay (SKILLED_NURSING_FACILITY): Payer: Medicare Other | Admitting: Internal Medicine

## 2016-09-23 ENCOUNTER — Encounter: Payer: Self-pay | Admitting: Internal Medicine

## 2016-09-23 DIAGNOSIS — L03115 Cellulitis of right lower limb: Secondary | ICD-10-CM

## 2016-09-23 DIAGNOSIS — R2681 Unsteadiness on feet: Secondary | ICD-10-CM

## 2016-09-23 DIAGNOSIS — K219 Gastro-esophageal reflux disease without esophagitis: Secondary | ICD-10-CM | POA: Diagnosis not present

## 2016-09-23 DIAGNOSIS — L03116 Cellulitis of left lower limb: Secondary | ICD-10-CM | POA: Diagnosis not present

## 2016-09-23 DIAGNOSIS — D500 Iron deficiency anemia secondary to blood loss (chronic): Secondary | ICD-10-CM

## 2016-09-23 DIAGNOSIS — S82892E Other fracture of left lower leg, subsequent encounter for open fracture type I or II with routine healing: Secondary | ICD-10-CM | POA: Diagnosis not present

## 2016-09-23 DIAGNOSIS — F0280 Dementia in other diseases classified elsewhere without behavioral disturbance: Secondary | ICD-10-CM

## 2016-09-23 DIAGNOSIS — R600 Localized edema: Secondary | ICD-10-CM

## 2016-09-23 DIAGNOSIS — G301 Alzheimer's disease with late onset: Secondary | ICD-10-CM

## 2016-09-23 DIAGNOSIS — D72829 Elevated white blood cell count, unspecified: Secondary | ICD-10-CM

## 2016-09-23 DIAGNOSIS — K5900 Constipation, unspecified: Secondary | ICD-10-CM

## 2016-09-23 NOTE — Social Work (Addendum)
CSW received call from SNF advising that FL2 was not signed.  CSW called Dr. Lorre Nick at [phone] and advised about the [facility] form needing a co-sign. Staff indicated they would send message to Dr. Lorre Nick to complete.  Dr. Ronnie Derby signed FL2 and DC summary today. CSW sent thru hub to SNF. CSW called [place] and left msg advising of same as she was not available.  CSW discussed situation with clinical supervisor, Nathaniel Man.

## 2016-09-23 NOTE — Progress Notes (Signed)
Provider:  Blanchie Serve MD  Location:  [place] Room Number: 32 Place of Service:  SNF (31)  PCP: Blanchie Serve, MD Patient Care Team: Blanchie Serve, MD as PCP - General (Internal Medicine) Brooke Booze, MD as Consulting Physician (Cardiology) Marchia Bond, MD as Consulting Physician (Orthopedic Surgery) Kathrynn Ducking, MD as Consulting Physician (Neurology) Brand Males, MD as Consulting Physician (Pulmonary Disease) Ngetich, Nelda Bucks, NP as Nurse Practitioner (Family Medicine)  Extended Emergency Contact Information Primary Emergency Contact: [name] Address: [address]          [place], [place] [address] [name] Phone: [phone] Mobile Phone: [phone] Relation: Son Secondary Emergency Contact: [place] Mobile Phone: [phone] Relation: Son  Code Status: DNR Goals of Care: Advanced Directive information Advanced Directives 09/18/2016  Does Patient Have a Medical Advance Directive? Yes  Type of Advance Directive Out of facility DNR (pink MOST or yellow form)  Does patient want to make changes to medical advance directive? -  Copy of [place] in Chart? -  Pre-existing out of facility DNR order (yellow form or pink MOST form) Yellow form placed in chart (order not valid for inpatient use)      Chief Complaint  Patient presents with  . New Admit To SNF    New Admission Visit     HPI: Patient is a 83 y.o. female seen today for admission visit. She was in the hospital from 09/18/16-09/21/16 with left ankle open bimalleolar fracture. She underwent open reduction and internal fixation on 09/19/16. She is seen in her room today. She was residing in assisted living facility prior to this hospitalization. She is pleasantly confused. She has dementia, COPD, chronic back pain among others.   Past Medical History:  Diagnosis Date  .  Alzheimer's disease 06/22/2015  . Anemia    iron deficient  . Anemia   . Anxiety   . Chronic airway obstruction, not elsewhere classified   . Chronic back pain   . Chronic cystitis   . Cystitis, chronic    Dr Gaynelle Arabian  . Degenerative arthritis   . Depression   . Disturbance of skin sensation   . Disturbance of skin sensation   . Dyslipidemia   . Dyspnea    Dr Chase Caller  . Esophageal reflux   . Essential and other specified forms of tremor 12/11/2012  . Foot drop, bilateral 12/21/2013  . Gait disorder   . GERD (gastroesophageal reflux disease)   . Hemorrhoids   . Hereditary and idiopathic peripheral neuropathy 10/11/2015  . Hernia   . History of cerebrovascular disease    Moderate level small vessel disease  . Hyperlipidemia    mild  . Hypertension   . Osteoarthritis    right knee injection per Dr Trudie Reed  . Osteoporosis   . Other abnormal clinical finding   . Pain in joint, upper arm   . Peripheral vascular disease ([place])   . Rheumatoid arthritis ([place])    Dr Trudie Reed  . Tobacco use disorder   . Tremor    and gait disorder--Dr Jannifer Campbell  . Unspecified vitamin D deficiency    Past Surgical History:  Procedure Laterality Date  . APPENDECTOMY    . BACK SURGERY Bilateral    X2  . Bladder resuspension procedure    . CATARACT EXTRACTION Bilateral   . FEMUR IM NAIL Right 02/22/2014   Procedure: INTRAMEDULLARY (IM) NAIL FEMORAL;  Surgeon: Vonna Kotyk  Llana Aliment, MD;  Location: [place];  Service: Orthopedics;  Laterality: Right;  . INGUINAL HERNIA REPAIR Bilateral   . TONSILLECTOMY      reports that she has never smoked. She has never used smokeless tobacco. She reports that she does not drink alcohol or use drugs. Social History   Social History  . Marital status: Widowed    Spouse name: N/A  . Number of children: 3  . Years of education: college   Occupational History  . SECRETARY Retired  . Retired     Social History Main Topics  . Smoking status: Never Smoker  . Smokeless  tobacco: Never Used  . Alcohol use No  . Drug use: No  . Sexual activity: No   Other Topics Concern  . Not on file   Social History Narrative   Patient lives at [facility].    Patient is widowed.    Patient has 3 children.    Patient is right handed.    Patient has 2 years of Business school.       Tobacco use, amount per day now: None      Past tobacco use, amount per day: Never      How many years did you use tobacco: Never      Alcohol use (drinks per week): None      Diet: N/A      Do you drink/eat things with caffeine? No      Marital status: Widowed             What year were you married?      Do you live in a house, apartment, assisted living, condo, trailer? [place]      Is it one or more stories? 1      How many persons live in your home? N/A      Do you have any pets in your home? 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 N/A             How often? N/A      Do you have a living will? Yes      Do you have a DNR form? N/A           If not, do you want to discuss one? N/A      Do you have signed POA/HPOA forms? Yes               Functional Status Survey:    Family History  Problem Relation Age of Onset  . Heart Problems Mother   . Diabetes Father   . Heart attack Father   . Cancer Sister        Brain tumor  . Emphysema Brother     Health Maintenance  Topic Date Due  . PNA vac Low Risk Adult (2 of 2 - PCV13) 03/18/2017 (Originally 01/22/2007)  . INFLUENZA VACCINE  10/16/2016  . TETANUS/TDAP  09/19/2026  . DEXA SCAN  Completed    Allergies  Allergen Reactions  . Novocain [Procaine]     Passed  out  . Limbrel [Flavocoxid]     Dizziness   . Sertraline Anxiety    Outpatient Encounter Prescriptions as of 09/23/2016  Medication Sig  . acetaminophen (TYLENOL) 650 MG CR tablet Take 650 mg by mouth 2 (two) times daily. Take q4h prn for fever  . alum & mag hydroxide-simeth (MAALOX/MYLANTA) 200-200-20  MG/5ML  suspension Take 30 mLs by mouth every 4 (four) hours as needed for indigestion or heartburn.  . Calcium Carbonate-Vitamin D (CALTRATE 600+D) 600-400 MG-UNIT per tablet Take 1 tablet by mouth daily.  . cephALEXin (KEFLEX) 500 MG capsule Take 500 mg by mouth 4 (four) times daily.  . Cholecalciferol (VITAMIN D3) 10000 units TABS Take 1 tablet by mouth daily.  Marland Kitchen enoxaparin (LOVENOX) 30 MG/0.3ML injection Inject 0.3 mLs (30 mg total) into the skin daily.  . furosemide (LASIX) 20 MG tablet Take 20 mg by mouth 2 (two) times daily.   Marland Kitchen HYDROcodone-acetaminophen (NORCO/VICODIN) 5-325 MG tablet Take 1 tablet by mouth every 6 (six) hours as needed for moderate pain.  Marland Kitchen Hyoscyamine Sulfate SL (LEVSIN/SL) 0.125 MG SUBL Place under the tongue every 12 (twelve) hours as needed (for cramps).   . LORazepam (ATIVAN) 0.5 MG tablet Take 0.25 mg by mouth every 6 (six) hours as needed for anxiety.  . memantine (NAMENDA) 10 MG tablet Take 10 mg by mouth 2 (two) times daily.  . Misc Natural Products (OSTEO BI-FLEX TRIPLE STRENGTH) TABS Take 1 tablet by mouth daily.  . Multiple Vitamin (MULTIVITAMIN) tablet Take 1 tablet by mouth daily.  . Nutritional Supplements (RESOURCE 2.0) LIQD Take by mouth. 67ml twice daily  . ranitidine (ZANTAC) 150 MG tablet Take 150 mg by mouth 2 (two) times daily.  Marland Kitchen saccharomyces boulardii (FLORASTOR) 250 MG capsule Take 250 mg by mouth 2 (two) times daily.  . sennosides-docusate sodium (SENOKOT-S) 8.6-50 MG tablet Take 2 tablets by mouth daily.   No facility-administered encounter medications on file as of 09/23/2016.     Review of Systems  Constitutional: Negative for appetite change, chills, diaphoresis, fatigue and fever.  HENT: Positive for hearing loss. Negative for congestion, mouth sores and rhinorrhea.   Eyes: Negative for visual disturbance.  Respiratory: Negative for cough, shortness of breath and wheezing.   Cardiovascular: Negative for chest pain, palpitations and leg  swelling.  Gastrointestinal: Positive for nausea. Negative for abdominal pain and vomiting.       Last bowel movement was yesterday (small).  Genitourinary: Negative for dysuria.  Musculoskeletal: Positive for back pain.  Skin: Negative for rash.  Neurological: Negative for dizziness and headaches.  Psychiatric/Behavioral: Positive for confusion. Negative for agitation and behavioral problems.    Vitals:   09/23/16 1137  BP: (!) 149/74  Pulse: 82  Resp: 20  Temp: 97.8 F (36.6 C)  TempSrc: Oral  Weight: 126 lb (57.2 kg)  Height: 5\' 1"  (1.549 m)   Body mass index is 23.81 kg/m. Physical Exam  Constitutional: She appears well-developed and well-nourished.  Elderly female in no acute distress  HENT:  Head: Normocephalic and atraumatic.  Mouth/Throat: Oropharynx is clear and moist.  Eyes: Conjunctivae and EOM are normal. Pupils are equal, round, and reactive to light.  Neck: Normal range of motion. Neck supple. No thyromegaly present.  Cardiovascular: Normal rate and regular rhythm.   Murmur heard. Pulmonary/Chest: [place] normal and breath sounds normal. No respiratory distress. She has no wheezes.  Abdominal: Soft. Bowel sounds are normal. She exhibits no distension. There is no tenderness. There is no guarding.  Musculoskeletal: She exhibits no edema.  Can move all 4 extremities, cast to left ankle, able to wiggle her toes and has good capillary refill, arthritis changes to her fingers  Lymphadenopathy:    She has no cervical adenopathy.  Neurological: She is alert.  Oriented only to self  Skin: Skin is warm and dry. She is not  diaphoretic.  Psychiatric: She has a normal mood and affect.  Pleasantly confused    Labs reviewed: Basic Metabolic Panel:  Recent Labs  05/16/16 09/18/16 2232 09/18/16 2239  NA 144 137 136  K 3.9 4.6 4.5  CL  --  99* 96*  CO2  --  33*  --   GLUCOSE  --  115* 110*  BUN 18 24* 30*  CREATININE 0.9 1.06* 1.00  CALCIUM  --  9.4  --     Liver Function Tests:  Recent Labs  11/17/15 05/16/16  AST 17 16  ALT 11 7  ALKPHOS 71 70   No results for input(s): LIPASE, AMYLASE in the last 8760 hours. No results for input(s): AMMONIA in the last 8760 hours. CBC:  Recent Labs  11/17/15 05/16/16 09/18/16 2232 09/18/16 2239  WBC 9.3 6.9 11.9*  --   NEUTROABS  --   --  8.4*  --   HGB 14.0 14.3 13.0 13.3  HCT 42 44 39.5 39.0  MCV  --   --  99.7  --   PLT 293 260 358  --    Cardiac Enzymes: No results for input(s): CKTOTAL, CKMB, CKMBINDEX, TROPONINI in the last 8760 hours. BNP: Invalid input(s): POCBNP No results found for: HGBA1C Lab Results  Component Value Date   TSH 1.49 05/16/2016   Lab Results  Component Value Date   LTJQZESP23 300 12/21/2013   No results found for: FOLATE Lab Results  Component Value Date   IRON <10 (L) 07/14/2007   TIBC NOT CALC Not calculated due to Iron <10. 07/14/2007   FERRITIN 2 (L) 07/14/2007    Urinalysis 09/19/16 Color- yellow, appearance- cloudy, occult blood- trace, protein-1+, nitrite- negative, leukocyte esterase- 1+, wbc- 6-10, rbc- 3-10, squamous epithelial cells- 6-10, bacteria- none seen, hyaline cast- none seen  Imaging and Procedures obtained prior to SNF admission: Dg Ankle 2 Views Left  Result Date: 09/19/2016 CLINICAL DATA:  Left ankle fracture fixation. EXAM: DG C-ARM 61-120 MIN; LEFT ANKLE - 2 VIEW COMPARISON:  09/19/2016 FINDINGS: Intraoperative fluoroscopic images from fixation of medial and lateral malleolar fractures demonstrate sideplate and screw fixation of the distal fibula and cancellous screw fixation of the medial malleolus. The alignment is anatomic. No new fractures are seen. Soft tissue swelling. IMPRESSION: Intraoperative fluoroscopic images from fixation of the distal tibia and fibula. Fluoroscopy time 30 seconds. Electronically Signed   By: Fidela Salisbury M.D.   On: 09/19/2016 17:06   Ct Head Wo Contrast  Result Date: 09/18/2016 CLINICAL  DATA:  Dementia patient post unwitnessed fall landing on left side. Struck head on bed. EXAM: CT HEAD WITHOUT CONTRAST CT CERVICAL SPINE WITHOUT CONTRAST TECHNIQUE: Multidetector CT imaging of the head and cervical spine was performed following the standard protocol without intravenous contrast. Multiplanar CT image reconstructions of the cervical spine were also generated. COMPARISON:  Head CT 02/21/2014 FINDINGS: CT HEAD FINDINGS Brain: No evidence of acute infarction, hemorrhage, hydrocephalus, extra-axial collection or mass lesion/mass effect. Stable ventriculomegaly and ventricular morphology. Stable advanced chronic small vessel ischemia. Remote lacunar infarct in the right caudate. Vascular: Atherosclerosis of skullbase vasculature without hyperdense vessel or abnormal calcification. Skull: No skull fracture.  No focal lesion. Sinuses/Orbits: Paranasal sinuses and mastoid air cells are clear. The visualized orbits are unremarkable. Bilateral cataract resection. Other: None. CT CERVICAL SPINE FINDINGS Alignment: No traumatic malalignment. Minimal anterolisthesis of C7 on T1, retrolisthesis of C5 on C6, and anterolisthesis of C3 on C4 appears degenerative. This at the normally aligned. Skull  base and vertebrae: No acute fracture. Skullbase and dens are intact. Vertebral body marrow heterogeneity likely secondary to osteopenia. No discrete focal lesion. Soft tissues and spinal canal: No prevertebral fluid or swelling. No visible canal hematoma. Disc levels: Diffuse disc space narrowing and endplate spurring, most prominent at C5-C6. Multilevel facet arthropathy. Upper chest: No acute abnormality. Atherosclerosis of the visualized thoracic aorta. Other: Carotid calcifications. IMPRESSION: 1. No acute intracranial abnormality. No skull fracture. Stable chronic change. 2. Multilevel degenerative change in the cervical spine without acute fracture or subluxation. Electronically Signed   By: Jeb Levering M.D.    On: 09/18/2016 23:57   Ct Cervical Spine Wo Contrast  Result Date: 09/18/2016 CLINICAL DATA:  Dementia patient post unwitnessed fall landing on left side. Struck head on bed. EXAM: CT HEAD WITHOUT CONTRAST CT CERVICAL SPINE WITHOUT CONTRAST TECHNIQUE: Multidetector CT imaging of the head and cervical spine was performed following the standard protocol without intravenous contrast. Multiplanar CT image reconstructions of the cervical spine were also generated. COMPARISON:  Head CT 02/21/2014 FINDINGS: CT HEAD FINDINGS Brain: No evidence of acute infarction, hemorrhage, hydrocephalus, extra-axial collection or mass lesion/mass effect. Stable ventriculomegaly and ventricular morphology. Stable advanced chronic small vessel ischemia. Remote lacunar infarct in the right caudate. Vascular: Atherosclerosis of skullbase vasculature without hyperdense vessel or abnormal calcification. Skull: No skull fracture.  No focal lesion. Sinuses/Orbits: Paranasal sinuses and mastoid air cells are clear. The visualized orbits are unremarkable. Bilateral cataract resection. Other: None. CT CERVICAL SPINE FINDINGS Alignment: No traumatic malalignment. Minimal anterolisthesis of C7 on T1, retrolisthesis of C5 on C6, and anterolisthesis of C3 on C4 appears degenerative. This at the normally aligned. Skull base and vertebrae: No acute fracture. Skullbase and dens are intact. Vertebral body marrow heterogeneity likely secondary to osteopenia. No discrete focal lesion. Soft tissues and spinal canal: No prevertebral fluid or swelling. No visible canal hematoma. Disc levels: Diffuse disc space narrowing and endplate spurring, most prominent at C5-C6. Multilevel facet arthropathy. Upper chest: No acute abnormality. Atherosclerosis of the visualized thoracic aorta. Other: Carotid calcifications. IMPRESSION: 1. No acute intracranial abnormality. No skull fracture. Stable chronic change. 2. Multilevel degenerative change in the cervical spine  without acute fracture or subluxation. Electronically Signed   By: Jeb Levering M.D.   On: 09/18/2016 23:57   Dg Chest Port 1 View  Result Date: 09/18/2016 CLINICAL DATA:  Fall at home with open ankle fracture. EXAM: PORTABLE CHEST 1 VIEW COMPARISON:  Radiograph 02/21/2014 FINDINGS: Low lung volumes limit assessment. Mild cardiomegaly. Normal mediastinal contours. No pulmonary edema. Possible blunting of left costophrenic angle versus soft tissue attenuation. No consolidation or pneumothorax. Postsurgical change in the lower thoracic and lumbar spine, partially included. No acute osseous abnormalities are seen. The bones appear under mineralized. IMPRESSION: Low lung volumes with mild cardiomegaly. Possible small left pleural effusion. Electronically Signed   By: Jeb Levering M.D.   On: 09/18/2016 22:52   Dg Ankle Left Port  Result Date: 09/19/2016 CLINICAL DATA:  Fracture, postreduction. EXAM: PORTABLE LEFT ANKLE - 2 VIEW COMPARISON:  Pre reduction radiographs yesterday. FINDINGS: Improved alignment of fracture subluxation of the left ankle postreduction. Tibial talar alignment is improved and near anatomic. Decrease displacement of medial malleolar and distal fibular fractures. Posterior tibial tubercle fracture is tentatively identified. Overlying splint material in place. IMPRESSION: Improved alignment postreduction with decrease displacement of medial malleolar and distal fibular fractures in improved tibial talar alignment. Question of posterior tibial tubercle fracture. Electronically Signed   By: Jeb Levering  M.D.   On: 09/19/2016 01:08   Dg Ankle Left Port  Result Date: 09/18/2016 CLINICAL DATA:  Fall at nursing home with ankle deformity. EXAM: PORTABLE LEFT ANKLE - 2 VIEW COMPARISON:  None. FINDINGS: Ankle fracture dislocation. The talus is displaced laterally with apex medial angulation with respect to the tibial plafond. Displaced transverse medial malleolar fracture with distal  fragment remaining aligned with the talus. Oblique distal fibular fracture with angulation, distal fragment remains aligned with the talus. No definite posterior tibial tubercle fracture. IMPRESSION: Bimalleolar ankle fracture dislocation. Lateral displacement of the talus with fractures of the medial malleolus and distal fibula, distal fracture fragment remain aligned with the talus. No definite posterior tibial tubercle fracture is seen. Electronically Signed   By: Jeb Levering M.D.   On: 09/18/2016 22:56   Dg C-arm 1-60 Min  Result Date: 09/19/2016 CLINICAL DATA:  Left ankle fracture fixation. EXAM: DG C-ARM 61-120 MIN; LEFT ANKLE - 2 VIEW COMPARISON:  09/19/2016 FINDINGS: Intraoperative fluoroscopic images from fixation of medial and lateral malleolar fractures demonstrate sideplate and screw fixation of the distal fibula and cancellous screw fixation of the medial malleolus. The alignment is anatomic. No new fractures are seen. Soft tissue swelling. IMPRESSION: Intraoperative fluoroscopic images from fixation of the distal tibia and fibula. Fluoroscopy time 30 seconds. Electronically Signed   By: Fidela Salisbury M.D.   On: 09/19/2016 17:06    Assessment/Plan  Unsteady gait With left ankle fracture. Will have patient work with PT/OT as tolerated to regain strength and restore function.  Fall precautions are in place.  Left ankle fracture S/p ORIF, will need orthopedic follow up. NWB to LLE for now. Will have her work with physical therapy and occupational therapy team to help with gait training and muscle strengthening exercises.fall precautions. Skin care. Encourage to be out of bed. Continue tylenol 650 mg bid for now. Also on norco 5-325 mg q6h prn pain. Continue calcium and vitamin d supplement. Continue lovenox bid for dvt prophylaxis for at least 4 weeks for now.   Leukocytosis Afebrile, was being treated for cellulitis that has now resolved. Monitor wbc curve and temp curve  Blood  loss anemia Post operative blood loss, monitor cbc  Cellulitis of legs Resolved, discontinue keflex as has completed her course.  Constipation With her limited mobility and being on narcotics, start senna s 2 tab qhs. Maintain hydration  Chronic leg edema Improved. Lasix was increased to 20 mg bid last week and with swelling subsided, decrease this to 20 mg daily for now. check bmp  gerd Continue ranitidine 150 mg bid, has been c/o nausea but denies pain, change admin time of ranitidine morning dosing to before breakfast and reassess  Alzheimer's dementia Supportive care for now, continue memantine.   Family/ staff Communication: reviewed care plan with patient and charge nurse.    Labs/tests ordered: cbc with diff, bmp next lab  [facility], MD Internal Medicine [facility] [place], [place] [address] Cell Phone (Monday-Friday 8 am - 5 pm): [phone] On Call: [phone] and follow prompts after 5 pm and on weekends Office Phone: [phone] Office Fax: [phone]

## 2016-09-23 NOTE — [place] FL2 (Signed)
[place] MEDICAID FL2 LEVEL OF CARE SCREENING TOOL     IDENTIFICATION  Patient Name: [name] Birthdate: 1924/11/19 Sex: female Admission Date (Current Location): 09/18/2016  [facility] and [place] Number:  Herbalist and Address:  [facility] [place], [place] [address] [place],  [address]      Provider Number: [id number]  Attending Physician Name and Address:  No att. providers found  Relative Name and Phone Number:       Current Level of Care: Hospital Recommended Level of Care: Nursing Facility Prior Approval Number:    Date Approved/Denied: 09/20/16 PASRR Number: [id number]  Discharge Plan: SNF    Current Diagnoses: Patient Active Problem List   Diagnosis Date Noted  . Open left ankle fracture 09/19/2016  . Internal hemorrhoids 11/16/2015  . Edema 11/16/2015  . Hereditary and idiopathic peripheral neuropathy 10/11/2015  . Alzheimer's disease 06/22/2015  . Osteopenia, senile 04/15/2014  . UTI (urinary tract infection) 04/01/2014  . Constipation 02/25/2014  . Small vessel disease, cerebrovascular 02/25/2014  . GERD (gastroesophageal reflux disease) 02/25/2014  . Heart murmur on physical examination 02/25/2014  . Fracture, subtrochanteric, right femur, closed ([place])   . Depression   . Gait disorder 12/21/2013  . Foot drop, bilateral 12/21/2013  . Benign essential tremor 12/11/2012  . HYPERTENSION, PULMONARY 03/30/2010    Orientation RESPIRATION BLADDER Height & Weight     Self, Place  O2 (Nasal Cannula) Incontinent Weight:   Height:     BEHAVIORAL SYMPTOMS/MOOD NEUROLOGICAL BOWEL NUTRITION STATUS      Continent Diet  AMBULATORY STATUS COMMUNICATION OF NEEDS Skin   Extensive Assist Verbally Surgical wounds (Left Leg Closed Incision, Compression Wrap)                       Personal Care Assistance Level of Assistance  Bathing, Feeding, Dressing Bathing Assistance: Maximum assistance Feeding assistance: Limited  assistance Dressing Assistance: Maximum assistance     Functional Limitations Info             SPECIAL CARE FACTORS FREQUENCY  PT (By licensed PT), OT (By licensed OT)                    Contractures      Additional Factors Info  Code Status, Allergies Code Status Info: Full Code Allergies Info: NOVOCAIN PROCAINE, LIMBREL FLAVOCOXID, SERTRALINE            Current Medications (09/23/2016):  This is the current hospital active medication list No current facility-administered medications for this encounter.    Current Outpatient Prescriptions  Medication Sig Dispense Refill  . acetaminophen (TYLENOL) 650 MG CR tablet Take 650 mg by mouth 2 (two) times daily. Take q4h prn for fever    . alum & mag hydroxide-simeth (MAALOX/MYLANTA) 200-200-20 MG/5ML suspension Take 30 mLs by mouth every 4 (four) hours as needed for indigestion or heartburn.    . Calcium Carbonate-Vitamin D (CALTRATE 600+D) 600-400 MG-UNIT per tablet Take 1 tablet by mouth daily.    . cephALEXin (KEFLEX) 500 MG capsule Take 500 mg by mouth 4 (four) times daily.    . Cholecalciferol (VITAMIN D3) 10000 units TABS Take 1 tablet by mouth daily.    . furosemide (LASIX) 20 MG tablet Take 20 mg by mouth 2 (two) times daily.     Marland Kitchen Hyoscyamine Sulfate SL (LEVSIN/SL) 0.125 MG SUBL Place under the tongue every 12 (twelve) hours as needed (for cramps).     Marland Kitchen  memantine (NAMENDA) 10 MG tablet Take 10 mg by mouth 2 (two) times daily.    . Misc Natural Products (OSTEO BI-FLEX TRIPLE STRENGTH) TABS Take 1 tablet by mouth daily.    . Multiple Vitamin (MULTIVITAMIN) tablet Take 1 tablet by mouth daily.    . Nutritional Supplements (RESOURCE 2.0) LIQD Take by mouth. 49ml twice daily    . ranitidine (ZANTAC) 150 MG tablet Take 150 mg by mouth 2 (two) times daily.    Marland Kitchen saccharomyces boulardii (FLORASTOR) 250 MG capsule Take 250 mg by mouth 2 (two) times daily.    . sennosides-docusate sodium (SENOKOT-S) 8.6-50 MG tablet Take 2  tablets by mouth daily. 30 tablet 1  . enoxaparin (LOVENOX) 30 MG/0.3ML injection Inject 0.3 mLs (30 mg total) into the skin daily. 13 Syringe 0  . HYDROcodone-acetaminophen (NORCO/VICODIN) 5-325 MG tablet Take 1 tablet by mouth every 6 (six) hours as needed for moderate pain. 30 tablet 0     Discharge Medications: Please see discharge summary for a list of discharge medications.  Relevant Imaging Results:  Relevant Lab Results:   Additional Information SS: [place], LCSW

## 2016-09-26 ENCOUNTER — Encounter: Payer: Self-pay | Admitting: *Deleted

## 2016-09-26 DIAGNOSIS — M7989 Other specified soft tissue disorders: Secondary | ICD-10-CM | POA: Diagnosis not present

## 2016-09-26 DIAGNOSIS — S82852D Displaced trimalleolar fracture of left lower leg, subsequent encounter for closed fracture with routine healing: Secondary | ICD-10-CM | POA: Diagnosis not present

## 2016-09-26 DIAGNOSIS — Z967 Presence of other bone and tendon implants: Secondary | ICD-10-CM | POA: Diagnosis not present

## 2016-09-26 LAB — CBC AND DIFFERENTIAL
HCT: 40 (ref 36–46)
Hemoglobin: 13.2 (ref 12.0–16.0)
PLATELETS: 447 — AB (ref 150–399)
WBC: 10.5

## 2016-09-26 LAB — BASIC METABOLIC PANEL
BUN: 16 (ref 4–21)
CREATININE: 0.9 (ref 0.5–1.1)
GLUCOSE: 95
Potassium: 4.5 (ref 3.4–5.3)
Sodium: 139 (ref 137–147)

## 2016-09-30 ENCOUNTER — Encounter: Payer: Self-pay | Admitting: Family

## 2016-09-30 ENCOUNTER — Non-Acute Institutional Stay (SKILLED_NURSING_FACILITY): Payer: Medicare Other | Admitting: Family

## 2016-09-30 DIAGNOSIS — H1033 Unspecified acute conjunctivitis, bilateral: Secondary | ICD-10-CM | POA: Diagnosis not present

## 2016-09-30 DIAGNOSIS — R410 Disorientation, unspecified: Secondary | ICD-10-CM

## 2016-09-30 NOTE — Op Note (Signed)
Dictation Number: [id number]

## 2016-10-01 NOTE — Op Note (Signed)
NAMEBELLAMI, FARRELLY NO.:  [id number]  MEDICAL RECORD NO.:  [id number]  LOCATION:  WOTF                         FACILITY:  [facility] [place] [facility]  PHYSICIAN:  Estill Bamberg. Ronnie Derby, M.D. DATE OF BIRTH:  03/21/1924  DATE OF PROCEDURE:  09/21/2016 DATE OF DISCHARGE:                              OPERATIVE REPORT   SURGEON:  Estill Bamberg. Ronnie Derby, M.D.  ASSISTANT:  Carlyon Shadow, PA-C.  ANESTHESIA:  General.  PREOPERATIVE DIAGNOSIS:  Left open ankle fracture, bimalleolar.  POSTOPERATIVE DIAGNOSIS:  Left open ankle fracture, bimalleolar.  PROCEDURE:  Left ankle open reduction and internal fixation, irrigation and debridement.  INDICATION FOR PROCEDURE:  The patient is a 83 year old white female who fell, was brought to the emergency room with a subluxed open ankle fracture.  The open area was washed and she was splinted by the ER physician, I was consulted later.  She was admitted to the Hospitalist Service.  I placed her on the surgical schedule for the following day. Informed consent was obtained from the son, who is her power-of- attorney.  DESCRIPTION OF PROCEDURE:  The patient was laid supine, administered general anesthesia.  The left leg prepped and draped in usual fashion. The extremity was exsanguinated with the Esmarch with tourniquet inflated to 300 mmHg.  I made a straight midline lateral incision with a #10 blade approximately 6 cm in length.  We used a 15-blade to defect off the lateral malleolus.  I then used a bone reduction forceps to reduce the lateral malleolus fracture and placed one anterior-posterior lag screw with a size 16 bicortical screw.  I then placed a 7-hole plate to the lateral aspect of the fibula with three proximal holes and two distal holes checking under C-arm guidance to ensure anatomic alignment. I then made a small hockey-stick incision on the medial aspect of the ankle and placed two 4.5-mm guidepins to reduce the fracture.  It was a very very  small piece of bone.  I did manage to get all bone back cortically that was well fixed and placed two 40-mm partially-threaded cancellous screws on the medial side.  I then took x-rays in AP, lateral, mortise x-rays with the C-arm to ensure hardware length was adequate, fracture was reduced anatomically.  I then lavaged the wounds. I then closed with 0 and 3-0 Vicryl sutures, small staples throughout and then placed her in a stirrup splint to make sure there was no posterior pressure that could treat any pressure blisters.  COMPLICATIONS:  None.  DRAINS:  None.          ______________________________ Estill Bamberg. Ronnie Derby, M.D.     SDL/MEDQ  D:  09/30/2016  T:  10/01/2016  Job:  [id number]

## 2016-10-03 NOTE — Progress Notes (Signed)
Location:  [facility] Room Number: N- 32 Place of Service:  SNF (31) Provider: Bernerd Terhune FNP-C  Blanchie Serve, MD  Patient Care Team: Blanchie Serve, MD as PCP - General (Internal Medicine) Jettie Booze, MD as Consulting Physician (Cardiology) Marchia Bond, MD as Consulting Physician (Orthopedic Surgery) Kathrynn Ducking, MD as Consulting Physician (Neurology) Brand Males, MD as Consulting Physician (Pulmonary Disease) Neela Zecca, Nelda Bucks, NP as Nurse Practitioner (Family Medicine)  Extended Emergency Contact Information Primary Emergency Contact: [facility] Address: [address]          [place], [place] [address] [place] Phone: [phone] Mobile Phone: [phone] Relation: Son Secondary Emergency Contact: [place] Mobile Phone: [phone] Relation: Son  Code Status: DNR Goals of care: Advanced Directive information Advanced Directives 09/18/2016  Does Patient Have a Medical Advance Directive? Yes  Type of Advance Directive Out of facility DNR (pink MOST or yellow form)  Does patient want to make changes to medical advance directive? -  Copy of [place] in Chart? -  Pre-existing out of facility DNR order (yellow form or pink MOST form) Yellow form placed in chart (order not valid for inpatient use)     Chief Complaint  Patient presents with  . Acute Visit    Confusion     HPI:  Pt is a 83 y.o. female seen today at [facility] for an acute visit for evaluation of increased confusion.She is seen in her room today per facility Nurse request. She is status post left ankle ORIF 09/19/2016 post fall episode sustaining a fracture. Facility Nurse reports patient has had increased confusion.Yelling out loudly at times.she able to make needs known.she states pain under control with current medications.She denies any fever, chills or cough.Her left  upper calf muscle wound and surgical incision managed by facility Nurse. No signs of infections reported.       Past Medical History:  Diagnosis Date  . Alzheimer's disease 06/22/2015  . Anemia    iron deficient  . Anemia   . Anxiety   . Chronic airway obstruction, not elsewhere classified   . Chronic back pain   . Chronic cystitis   . Cystitis, chronic    Dr Gaynelle Arabian  . Degenerative arthritis   . Depression   . Disturbance of skin sensation   . Disturbance of skin sensation   . Dyslipidemia   . Dyspnea    Dr Chase Caller  . Esophageal reflux   . Essential and other specified forms of tremor 12/11/2012  . Foot drop, bilateral 12/21/2013  . Gait disorder   . GERD (gastroesophageal reflux disease)   . Hemorrhoids   . Hereditary and idiopathic peripheral neuropathy 10/11/2015  . Hernia   . History of cerebrovascular disease    Moderate level small vessel disease  . Hyperlipidemia    mild  . Hypertension   . Osteoarthritis    right knee injection per Dr Trudie Reed  . Osteoporosis   . Other abnormal clinical finding   . Pain in joint, upper arm   . Peripheral vascular disease ([place])   . Rheumatoid arthritis ([place])    Dr Trudie Reed  . Tobacco use disorder   . Tremor    and gait disorder--Dr Jannifer Franklin  . Unspecified vitamin D deficiency    Past Surgical History:  Procedure Laterality Date  . APPENDECTOMY    . BACK SURGERY Bilateral    X2  . Bladder resuspension  procedure    . CATARACT EXTRACTION Bilateral   . FEMUR IM NAIL Right 02/22/2014   Procedure: INTRAMEDULLARY (IM) NAIL FEMORAL;  Surgeon: Johnny Bridge, MD;  Location: [facility];  Service: Orthopedics;  Laterality: Right;  . INGUINAL HERNIA REPAIR Bilateral   . ORIF ANKLE FRACTURE Left 09/19/2016   Procedure: OPEN REDUCTION INTERNAL FIXATION (ORIF) ANKLE FRACTURE;  Surgeon: Vickey Huger, MD;  Location: [facility];  Service: Orthopedics;  Laterality: Left;  . TONSILLECTOMY      Allergies  Allergen Reactions  . Novocain [Procaine]       Passed  out  . Limbrel [Flavocoxid]     Dizziness   . Sertraline Anxiety    Allergies as of 09/30/2016      Reactions   Novocain [procaine]    Passed  out   Limbrel [flavocoxid]    Dizziness   Sertraline Anxiety      Medication List       Accurate as of 09/30/16 11:59 PM. Always use your most recent med list.          acetaminophen 650 MG CR tablet Commonly known as:  TYLENOL Take 650 mg by mouth 2 (two) times daily.   alum & mag hydroxide-simeth 200-200-20 MG/5ML suspension Commonly known as:  MAALOX/MYLANTA Take 30 mLs by mouth every 4 (four) hours as needed for indigestion or heartburn.   CALTRATE 600+D 600-400 MG-UNIT tablet Generic drug:  Calcium Carbonate-Vitamin D Take 1 tablet by mouth daily.   enoxaparin 30 MG/0.3ML injection Commonly known as:  LOVENOX Inject 0.3 mLs (30 mg total) into the skin daily.   furosemide 20 MG tablet Commonly known as:  LASIX Take 20 mg by mouth daily.   HYDROcodone-acetaminophen 5-325 MG tablet Commonly known as:  NORCO/VICODIN Take 1 tablet by mouth every 6 (six) hours as needed for moderate pain.   LEVSIN/SL 0.125 MG Subl Generic drug:  Hyoscyamine Sulfate SL Place under the tongue every 12 (twelve) hours as needed (for cramps).   memantine 10 MG tablet Commonly known as:  NAMENDA Take 10 mg by mouth 2 (two) times daily.   multivitamin tablet Take 1 tablet by mouth daily.   OSTEO BI-FLEX TRIPLE STRENGTH Tabs Take 1 tablet by mouth daily.   ranitidine 150 MG tablet Commonly known as:  ZANTAC Take 150 mg by mouth at bedtime.   RESOURCE 2.0 Liqd Take by mouth. 50ml twice daily   sennosides-docusate sodium 8.6-50 MG tablet Commonly known as:  SENOKOT-S Take 2 tablets by mouth daily.   Vitamin D3 10000 units Tabs Take 1 tablet by mouth daily.       Review of Systems  Constitutional: Negative for activity change, appetite change, chills, fatigue and fever.  HENT: Negative for congestion, rhinorrhea,  sinus pain, sinus pressure, sneezing and sore throat.   Eyes: Positive for discharge. Negative for pain, redness and itching.  Respiratory: Negative for cough, chest tightness, shortness of breath and wheezing.   Cardiovascular: Positive for leg swelling. Negative for chest pain and palpitations.  Gastrointestinal: Negative for abdominal distention, abdominal pain, constipation, diarrhea, nausea and vomiting.  Genitourinary: Negative for dyspareunia, dysuria, flank pain, frequency and urgency.  Skin: Negative for color change, pallor and rash.  Neurological: Negative for dizziness, syncope, light-headedness and headaches.  Psychiatric/Behavioral: Positive for confusion. Negative for agitation, hallucinations and sleep disturbance.    Immunization History  Administered Date(s) Administered  . Influenza Whole 01/16/2010  . Influenza-Unspecified 12/22/2014, 12/28/2015  . Pneumococcal Polysaccharide-23 01/21/2006  . Pneumococcal-Unspecified 12/19/1998  .  Td 03/18/1998  . Tdap 09/18/2016   Pertinent  Health Maintenance Due  Topic Date Due  . PNA vac Low Risk Adult (2 of 2 - PCV13) 03/18/2017 (Originally 01/22/2007)  . INFLUENZA VACCINE  10/16/2016  . DEXA SCAN  Completed   Fall Risk  06/20/2015  Falls in the past year? No  Risk for fall due to : Impaired balance/gait    Vitals:   09/30/16 1542  BP: (!) 152/84  Pulse: 88  Resp: 18  Temp: 98 F (36.7 C)  TempSrc: Oral  SpO2: 98%  Weight: 122 lb (55.3 kg)  Height: 5\' 1"  (1.549 m)   Body mass index is 23.05 kg/m. Physical Exam  Constitutional: She appears well-developed and well-nourished. No distress.  HENT:  Head: Normocephalic.  Mouth/Throat: Oropharynx is clear and moist. No oropharyngeal exudate.  Eyes: Pupils are equal, round, and reactive to light. Conjunctivae and EOM are normal. No scleral icterus.  Yellow thick drainage on left eye. Eyelashes matted.   Neck: Normal range of motion. No JVD present. No thyromegaly  present.  Cardiovascular: Intact distal pulses.  Exam reveals no gallop and no friction rub.   Murmur heard. Pulmonary/Chest: Effort normal and breath sounds normal. No respiratory distress. She has no wheezes. She has no rales.  Abdominal: Soft. Bowel sounds are normal. She exhibits no distension. There is no tenderness. There is no rebound and no guarding.  Musculoskeletal: She exhibits no tenderness or deformity.  Moves x 4 extremities.Unsteady gait uses walker. Bilateral lower extremities edema knee high wraps in place.   Lymphadenopathy:    She has no cervical adenopathy.  Neurological: She is alert. Coordination normal.  Disoriented to time  Skin: Skin is warm and dry. No rash noted. No erythema. No pallor.  Bilateral knee high wrap dressing dry,clean and intact.   Psychiatric: She has a normal mood and affect.    Labs reviewed:  Recent Labs  09/18/16 2232 09/18/16 2239 09/26/16  NA 137 136 139  K 4.6 4.5 4.5  CL 99* 96*  --   CO2 33*  --   --   GLUCOSE 115* 110*  --   BUN 24* 30* 16  CREATININE 1.06* 1.00 0.9  CALCIUM 9.4  --   --     Recent Labs  11/17/15 05/16/16  AST 17 16  ALT 11 7  ALKPHOS 71 70    Recent Labs  05/16/16 09/18/16 2232 09/18/16 2239 09/26/16  WBC 6.9 11.9*  --  10.5  NEUTROABS  --  8.4*  --   --   HGB 14.3 13.0 13.3 13.2  HCT 44 39.5 39.0 40  MCV  --  99.7  --   --   PLT 260 358  --  447*   Lab Results  Component Value Date   TSH 1.49 05/16/2016    Significant Diagnostic Results in last 30 days:  Dg Ankle 2 Views Left  Result Date: 09/19/2016 CLINICAL DATA:  Left ankle fracture fixation. EXAM: DG C-ARM 61-120 MIN; LEFT ANKLE - 2 VIEW COMPARISON:  09/19/2016 FINDINGS: Intraoperative fluoroscopic images from fixation of medial and lateral malleolar fractures demonstrate sideplate and screw fixation of the distal fibula and cancellous screw fixation of the medial malleolus. The alignment is anatomic. No new fractures are seen. Soft  tissue swelling. IMPRESSION: Intraoperative fluoroscopic images from fixation of the distal tibia and fibula. Fluoroscopy time 30 seconds. Electronically Signed   By: Fidela Salisbury M.D.   On: 09/19/2016 17:06   Ct Head  Wo Contrast  Result Date: 09/18/2016 CLINICAL DATA:  Dementia patient post unwitnessed fall landing on left side. Struck head on bed. EXAM: CT HEAD WITHOUT CONTRAST CT CERVICAL SPINE WITHOUT CONTRAST TECHNIQUE: Multidetector CT imaging of the head and cervical spine was performed following the standard protocol without intravenous contrast. Multiplanar CT image reconstructions of the cervical spine were also generated. COMPARISON:  Head CT 02/21/2014 FINDINGS: CT HEAD FINDINGS Brain: No evidence of acute infarction, hemorrhage, hydrocephalus, extra-axial collection or mass lesion/mass effect. Stable ventriculomegaly and ventricular morphology. Stable advanced chronic small vessel ischemia. Remote lacunar infarct in the right caudate. Vascular: Atherosclerosis of skullbase vasculature without hyperdense vessel or abnormal calcification. Skull: No skull fracture.  No focal lesion. Sinuses/Orbits: Paranasal sinuses and mastoid air cells are clear. The visualized orbits are unremarkable. Bilateral cataract resection. Other: None. CT CERVICAL SPINE FINDINGS Alignment: No traumatic malalignment. Minimal anterolisthesis of C7 on T1, retrolisthesis of C5 on C6, and anterolisthesis of C3 on C4 appears degenerative. This at the normally aligned. Skull base and vertebrae: No acute fracture. Skullbase and dens are intact. Vertebral body marrow heterogeneity likely secondary to osteopenia. No discrete focal lesion. Soft tissues and spinal canal: No prevertebral fluid or swelling. No visible canal hematoma. Disc levels: Diffuse disc space narrowing and endplate spurring, most prominent at C5-C6. Multilevel facet arthropathy. Upper chest: No acute abnormality. Atherosclerosis of the visualized thoracic  aorta. Other: Carotid calcifications. IMPRESSION: 1. No acute intracranial abnormality. No skull fracture. Stable chronic change. 2. Multilevel degenerative change in the cervical spine without acute fracture or subluxation. Electronically Signed   By: Jeb Levering M.D.   On: 09/18/2016 23:57   Ct Cervical Spine Wo Contrast  Result Date: 09/18/2016 CLINICAL DATA:  Dementia patient post unwitnessed fall landing on left side. Struck head on bed. EXAM: CT HEAD WITHOUT CONTRAST CT CERVICAL SPINE WITHOUT CONTRAST TECHNIQUE: Multidetector CT imaging of the head and cervical spine was performed following the standard protocol without intravenous contrast. Multiplanar CT image reconstructions of the cervical spine were also generated. COMPARISON:  Head CT 02/21/2014 FINDINGS: CT HEAD FINDINGS Brain: No evidence of acute infarction, hemorrhage, hydrocephalus, extra-axial collection or mass lesion/mass effect. Stable ventriculomegaly and ventricular morphology. Stable advanced chronic small vessel ischemia. Remote lacunar infarct in the right caudate. Vascular: Atherosclerosis of skullbase vasculature without hyperdense vessel or abnormal calcification. Skull: No skull fracture.  No focal lesion. Sinuses/Orbits: Paranasal sinuses and mastoid air cells are clear. The visualized orbits are unremarkable. Bilateral cataract resection. Other: None. CT CERVICAL SPINE FINDINGS Alignment: No traumatic malalignment. Minimal anterolisthesis of C7 on T1, retrolisthesis of C5 on C6, and anterolisthesis of C3 on C4 appears degenerative. This at the normally aligned. Skull base and vertebrae: No acute fracture. Skullbase and dens are intact. Vertebral body marrow heterogeneity likely secondary to osteopenia. No discrete focal lesion. Soft tissues and spinal canal: No prevertebral fluid or swelling. No visible canal hematoma. Disc levels: Diffuse disc space narrowing and endplate spurring, most prominent at C5-C6. Multilevel facet  arthropathy. Upper chest: No acute abnormality. Atherosclerosis of the visualized thoracic aorta. Other: Carotid calcifications. IMPRESSION: 1. No acute intracranial abnormality. No skull fracture. Stable chronic change. 2. Multilevel degenerative change in the cervical spine without acute fracture or subluxation. Electronically Signed   By: Jeb Levering M.D.   On: 09/18/2016 23:57   Dg Chest Port 1 View  Result Date: 09/18/2016 CLINICAL DATA:  Fall at home with open ankle fracture. EXAM: PORTABLE CHEST 1 VIEW COMPARISON:  Radiograph 02/21/2014 FINDINGS: Low lung  volumes limit assessment. Mild cardiomegaly. Normal mediastinal contours. No pulmonary edema. Possible blunting of left costophrenic angle versus soft tissue attenuation. No consolidation or pneumothorax. Postsurgical change in the lower thoracic and lumbar spine, partially included. No acute osseous abnormalities are seen. The bones appear under mineralized. IMPRESSION: Low lung volumes with mild cardiomegaly. Possible small left pleural effusion. Electronically Signed   By: Jeb Levering M.D.   On: 09/18/2016 22:52   Dg Ankle Left Port  Result Date: 09/19/2016 CLINICAL DATA:  Fracture, postreduction. EXAM: PORTABLE LEFT ANKLE - 2 VIEW COMPARISON:  Pre reduction radiographs yesterday. FINDINGS: Improved alignment of fracture subluxation of the left ankle postreduction. Tibial talar alignment is improved and near anatomic. Decrease displacement of medial malleolar and distal fibular fractures. Posterior tibial tubercle fracture is tentatively identified. Overlying splint material in place. IMPRESSION: Improved alignment postreduction with decrease displacement of medial malleolar and distal fibular fractures in improved tibial talar alignment. Question of posterior tibial tubercle fracture. Electronically Signed   By: Jeb Levering M.D.   On: 09/19/2016 01:08   Dg Ankle Left Port  Result Date: 09/18/2016 CLINICAL DATA:  Fall at nursing  home with ankle deformity. EXAM: PORTABLE LEFT ANKLE - 2 VIEW COMPARISON:  None. FINDINGS: Ankle fracture dislocation. The talus is displaced laterally with apex medial angulation with respect to the tibial plafond. Displaced transverse medial malleolar fracture with distal fragment remaining aligned with the talus. Oblique distal fibular fracture with angulation, distal fragment remains aligned with the talus. No definite posterior tibial tubercle fracture. IMPRESSION: Bimalleolar ankle fracture dislocation. Lateral displacement of the talus with fractures of the medial malleolus and distal fibula, distal fracture fragment remain aligned with the talus. No definite posterior tibial tubercle fracture is seen. Electronically Signed   By: Jeb Levering M.D.   On: 09/18/2016 22:56   Dg C-arm 1-60 Min  Result Date: 09/19/2016 CLINICAL DATA:  Left ankle fracture fixation. EXAM: DG C-ARM 61-120 MIN; LEFT ANKLE - 2 VIEW COMPARISON:  09/19/2016 FINDINGS: Intraoperative fluoroscopic images from fixation of medial and lateral malleolar fractures demonstrate sideplate and screw fixation of the distal fibula and cancellous screw fixation of the medial malleolus. The alignment is anatomic. No new fractures are seen. Soft tissue swelling. IMPRESSION: Intraoperative fluoroscopic images from fixation of the distal tibia and fibula. Fluoroscopy time 30 seconds. Electronically Signed   By: Fidela Salisbury M.D.   On: 09/19/2016 17:06   Assessment/Plan   Acute confusion Afebrile.yells out loudly per facility Nurse.Obtain urine specimen for U/A and C/S rule out UTI.  Bacterial conjuctivitis  Yellow thick drainage on left eye.Eyelashes matted.Minimal drainage on right eye.Facility Nurse to cleanse eyes with warm wash clothe four times daily then apply Erythromycin Opth 0.5 % one centimeter ribbon to each eye four times daily x 7 days.    Family/ staff Communication: Reviewed plan of care with patient and facility  Nurse supervisor  Labs/tests ordered: None   Sandrea Hughs, NP

## 2016-10-07 ENCOUNTER — Encounter (HOSPITAL_COMMUNITY): Payer: Self-pay | Admitting: Student

## 2016-10-07 ENCOUNTER — Non-Acute Institutional Stay (SKILLED_NURSING_FACILITY): Payer: Medicare Other | Admitting: Family

## 2016-10-07 ENCOUNTER — Encounter: Payer: Self-pay | Admitting: Family

## 2016-10-07 ENCOUNTER — Emergency Department (HOSPITAL_COMMUNITY)
Admission: EM | Admit: 2016-10-07 | Discharge: 2016-10-07 | Disposition: A | Discharge status: Home / Self Care | Payer: Medicare Other | Attending: Emergency Medicine | Admitting: Emergency Medicine

## 2016-10-07 DIAGNOSIS — Z7901 Long term (current) use of anticoagulants: Secondary | ICD-10-CM | POA: Insufficient documentation

## 2016-10-07 DIAGNOSIS — I272 Pulmonary hypertension, unspecified: Secondary | ICD-10-CM | POA: Diagnosis not present

## 2016-10-07 DIAGNOSIS — N3000 Acute cystitis without hematuria: Secondary | ICD-10-CM | POA: Diagnosis not present

## 2016-10-07 DIAGNOSIS — G309 Alzheimer's disease, unspecified: Secondary | ICD-10-CM | POA: Insufficient documentation

## 2016-10-07 DIAGNOSIS — R338 Other retention of urine: Secondary | ICD-10-CM

## 2016-10-07 DIAGNOSIS — R339 Retention of urine, unspecified: Secondary | ICD-10-CM | POA: Diagnosis not present

## 2016-10-07 DIAGNOSIS — Z79899 Other long term (current) drug therapy: Secondary | ICD-10-CM | POA: Insufficient documentation

## 2016-10-07 DIAGNOSIS — N390 Urinary tract infection, site not specified: Secondary | ICD-10-CM | POA: Diagnosis not present

## 2016-10-07 DIAGNOSIS — S51812A Laceration without foreign body of left forearm, initial encounter: Secondary | ICD-10-CM | POA: Diagnosis not present

## 2016-10-07 LAB — URINALYSIS, ROUTINE W REFLEX MICROSCOPIC
BILIRUBIN URINE: NEGATIVE
GLUCOSE, UA: NEGATIVE mg/dL
Ketones, ur: NEGATIVE mg/dL
NITRITE: POSITIVE — AB
PH: 8 (ref 5.0–8.0)
Protein, ur: 30 mg/dL — AB
SPECIFIC GRAVITY, URINE: 1.01 (ref 1.005–1.030)

## 2016-10-07 NOTE — ED Notes (Signed)
Patient was alert, oriented and stable upon discharge. RN went over AVS and patient had no further questions.  

## 2016-10-07 NOTE — ED Notes (Signed)
Bed: [facility] Expected date:  Expected time:  Means of arrival:  Comments: 

## 2016-10-07 NOTE — ED Triage Notes (Signed)
Patient BIB EMS from [facility] with c/o urinary retention and abdominal pain. Per report, patient has not had any voluntary urinary output for 24 hours. Per report, Foley cathter was placed at 0900 and 238ml urine was obtained. Patient remains distended and c/o abdominal pain. Per report, patient is "currently fighting off a UTI" although no information as to antibiotics or length of infection were given.

## 2016-10-07 NOTE — Discharge Instructions (Signed)
Complete the course of antibiotics.  Continue to stay well-hydrated.  We have sent the urine results for culture, and you will receive a call regarding further treatment. Follow-up with your primary care for further evaluation of urinary symptoms. Return to the emergency department if you develop worsening urinary retention, fever, chills, nausea, vomiting, or any new or worsening symptoms.

## 2016-10-07 NOTE — ED Notes (Signed)
[facility] called for transport.  

## 2016-10-07 NOTE — ED Provider Notes (Signed)
[facility] DEPT Provider Note   CSN: [id number] Arrival date & time: 10/07/16  1509     History   Chief Complaint No chief complaint on file.   HPI [name] is a 83 y.o. female presenting with urinary retention.  Level V caveat as patient has dementia. History was obtained by staff at [facility].  Staff states that patient was evaluated by nurse practitioner this morning and was complaining of intense lower abdominal pain. On palpation, bladder was distended. Foley catheter was inserted at 9:00 this morning. As of 2:00 this afternoon, only 50 mL were in the Foley bag, and patient was still complaining of abdominal pain. She denies history of previous urinary retention. Patient is currently being treated for UTI with amoxicillin, and today is the last dose of 3 day tx. Denies fever, chills, change in appetite, abnormal bowel movements. Patient is incontinent, and had normal urinary incontinence yesterday. Pt is acting at baseline mental status.  HPI  Past Medical History:  Diagnosis Date  . Alzheimer's disease 06/22/2015  . Anemia    iron deficient  . Anemia   . Anxiety   . Chronic airway obstruction, not elsewhere classified   . Chronic back pain   . Chronic cystitis   . Cystitis, chronic    Dr Gaynelle Arabian  . Degenerative arthritis   . Depression   . Disturbance of skin sensation   . Disturbance of skin sensation   . Dyslipidemia   . Dyspnea    Dr Chase Caller  . Esophageal reflux   . Essential and other specified forms of tremor 12/11/2012  . Foot drop, bilateral 12/21/2013  . Gait disorder   . GERD (gastroesophageal reflux disease)   . Hemorrhoids   . Hereditary and idiopathic peripheral neuropathy 10/11/2015  . Hernia   . History of cerebrovascular disease    Moderate level small vessel disease  . Hyperlipidemia    mild  . Hypertension   . Osteoarthritis    right knee injection per Dr Trudie Reed  . Osteoporosis   . Other abnormal clinical finding   .  Pain in joint, upper arm   . Peripheral vascular disease ([place])   . Rheumatoid arthritis ([place])    Dr Trudie Reed  . Tobacco use disorder   . Tremor    and gait disorder--Dr Jannifer Franklin  . Unspecified vitamin D deficiency     Patient Active Problem List   Diagnosis Date Noted  . Open left ankle fracture 09/19/2016  . Internal hemorrhoids 11/16/2015  . Edema 11/16/2015  . Hereditary and idiopathic peripheral neuropathy 10/11/2015  . Alzheimer's disease 06/22/2015  . Osteopenia, senile 04/15/2014  . UTI (urinary tract infection) 04/01/2014  . Constipation 02/25/2014  . Small vessel disease, cerebrovascular 02/25/2014  . GERD (gastroesophageal reflux disease) 02/25/2014  . Heart murmur on physical examination 02/25/2014  . Fracture, subtrochanteric, right femur, closed ([place])   . Depression   . Gait disorder 12/21/2013  . Foot drop, bilateral 12/21/2013  . Benign essential tremor 12/11/2012  . HYPERTENSION, PULMONARY 03/30/2010    Past Surgical History:  Procedure Laterality Date  . APPENDECTOMY    . BACK SURGERY Bilateral    X2  . Bladder resuspension procedure    . CATARACT EXTRACTION Bilateral   . FEMUR IM NAIL Right 02/22/2014   Procedure: INTRAMEDULLARY (IM) NAIL FEMORAL;  Surgeon: Johnny Bridge, MD;  Location: [facility];  Service: Orthopedics;  Laterality: Right;  . INGUINAL HERNIA REPAIR Bilateral   . ORIF ANKLE FRACTURE Left  09/19/2016   Procedure: OPEN REDUCTION INTERNAL FIXATION (ORIF) ANKLE FRACTURE;  Surgeon: Vickey Huger, MD;  Location: [facility];  Service: Orthopedics;  Laterality: Left;  . TONSILLECTOMY      OB History    No data available       Home Medications    Prior to Admission medications   Medication Sig Start Date End Date Taking? Authorizing Provider  acetaminophen (TYLENOL) 650 MG CR tablet Take 650 mg by mouth 2 (two) times daily.     [provider]  alum & mag hydroxide-simeth (MAALOX/MYLANTA) 200-200-20 MG/5ML suspension Take 30 mLs by mouth  every 4 (four) hours as needed for indigestion or heartburn.    [provider]  Calcium Carbonate-Vitamin D (CALTRATE 600+D) 600-400 MG-UNIT per tablet Take 1 tablet by mouth daily.    [provider]  Cholecalciferol (VITAMIN D3) 10000 units TABS Take 1 tablet by mouth daily.    [provider]  enoxaparin (LOVENOX) 30 MG/0.3ML injection Inject 0.3 mLs (30 mg total) into the skin daily. 09/21/16   Donia Ast, PA  furosemide (LASIX) 20 MG tablet Take 20 mg by mouth daily.     [provider]  HYDROcodone-acetaminophen (NORCO/VICODIN) 5-325 MG tablet Take 1 tablet by mouth every 6 (six) hours as needed for moderate pain. 09/21/16   Donia Ast, PA  Hyoscyamine Sulfate SL (LEVSIN/SL) 0.125 MG SUBL Place under the tongue every 12 (twelve) hours as needed (for cramps).     [provider]  memantine (NAMENDA) 10 MG tablet Take 10 mg by mouth 2 (two) times daily.    [provider]  Misc Natural Products (OSTEO BI-FLEX TRIPLE STRENGTH) TABS Take 1 tablet by mouth daily.    [provider]  Multiple Vitamin (MULTIVITAMIN) tablet Take 1 tablet by mouth daily.    [provider]  Nutritional Supplements (RESOURCE 2.0) LIQD Take by mouth. 49ml twice daily    [provider]  ranitidine (ZANTAC) 150 MG tablet Take 150 mg by mouth at bedtime.     [provider]  sennosides-docusate sodium (SENOKOT-S) 8.6-50 MG tablet Take 2 tablets by mouth daily. 02/22/14   Marchia Bond, MD    Family History Family History  Problem Relation Age of Onset  . Heart Problems Mother   . Diabetes Father   . Heart attack Father   . Cancer Sister        Brain tumor  . Emphysema Brother     Social History Social History  Substance Use Topics  . Smoking status: Never Smoker  . Smokeless tobacco: Never Used  . Alcohol use No     Allergies   Novocain [procaine]; Limbrel [flavocoxid]; and Sertraline   Review of  Systems Review of Systems  Unable to perform ROS: Dementia     Physical Exam Updated Vital Signs BP 118/64 (BP Location: Left Arm)   Pulse 92   Temp 98.2 F (36.8 C) (Oral)   Resp 16   Ht 5\' 2"  (1.575 m)   Wt 54.4 kg (120 lb)   SpO2 91%   BMI 21.95 kg/m   Physical Exam  Constitutional: She appears well-developed and well-nourished. No distress.  HENT:  Head: Normocephalic and atraumatic.  Eyes: Pupils are equal, round, and reactive to light. EOM are normal.  Some crusting noted in the eyes. Patient is receiving amoxicillin ointment.  Neck: Normal range of motion.  Cardiovascular: Normal rate, regular rhythm and intact distal pulses.   Pulmonary/Chest: Effort normal and  breath sounds normal. No respiratory distress. She has no wheezes.  Abdominal: Soft. Bowel sounds are normal. She exhibits no distension. There is no tenderness. There is no guarding.  Genitourinary:  Genitourinary Comments: Patient with Foley. Bag shows 400 mL without obvious blood or cloudiness.  Musculoskeletal: Normal range of motion.  Moves all extremities appropriately. Follows commands.  Lymphadenopathy:    She has no cervical adenopathy.  Neurological: She is alert.  Patient with incongruous answers, consistent with dementia.   Skin: Skin is warm and dry.  Psychiatric: She has a normal mood and affect.  Nursing note and vitals reviewed.   EMERGENCY DEPARTMENT ULTRASOUND  Study: Limited Ultrasound of Bladder  INDICATIONS: to assess for urinary retention and/or bladder volume prior to urinary catheter Multiple views of the bladder were obtained in real-time in the transverse and longitudinal planes with a multi-frequency probe.  PERFORMED BY: Myself IMAGES ARCHIVED?: Yes LIMITATIONS:  None INTERPRETATION: Minimal Volume and Volume Measurement 26 cc     ED Treatments / Results  Labs (all labs ordered are listed, but only abnormal results are displayed) Labs Reviewed  URINALYSIS, ROUTINE  W REFLEX MICROSCOPIC - Abnormal; Notable for the following:       Result Value   APPearance TURBID (*)    Hgb urine dipstick MODERATE (*)    Protein, ur 30 (*)    Nitrite POSITIVE (*)    Leukocytes, UA LARGE (*)    Bacteria, UA MANY (*)    Squamous Epithelial / LPF 0-5 (*)    All other components within normal limits  URINE CULTURE    EKG  EKG Interpretation None       Radiology No results found.  Procedures Procedures (including critical care time)  Medications Ordered in ED Medications - No data to display   Initial Impression / Assessment and Plan / ED Course  I have reviewed the triage vital signs and the nursing notes.  Pertinent labs & imaging results that were available during my care of the patient were reviewed by me and considered in my medical decision making (see chart for details).     Patient presenting from nursing home with acute urinary retention starting this morning. Per nursing home, Foley was placed at 9:00 this morning, and as of 2:00, there is only 50 mL output. Physical exam shows patient without complaints of tenderness or obvious abdominal tenderness on exam. Foley shows 400 mL output. Bladder scan shows 26 mL. No fever, chills, nausea, vomiting, or abnormal urination yesterday. No signs of sepsis or systemic infection. Patient is currently on treatment for UTI. Previous results came back with Escherichia coli, and she is on amoxicillin, finishing her 3 day course today. UA showed positive nitrates and leuks. Will send for culture. Will discharge patient with instructions to follow-up with primary care regarding urinary symptoms. Monitor for worsening signs of UTI including fever, chills, or abnormal mental status.  Pt discussed with attending, and Dr. Leonette Monarch evaluated the pt.  Findings were discussed with daughter in law. Return precautions given. Daughter in law states she understands and agrees to plan.    Final Clinical Impressions(s) / ED  Diagnoses   Final diagnoses:  Urinary tract infection without hematuria, site unspecified  Acute urinary retention    New Prescriptions New Prescriptions   No medications on file     Franchot Heidelberg, PA-C 10/07/16 1911

## 2016-10-07 NOTE — ED Provider Notes (Signed)
Medical screening examination/treatment/procedure(s) were conducted as a shared visit with non-physician practitioner(s) and myself.  I personally evaluated the patient during the encounter. Briefly, the patient is a 83 y.o. female w/ HO dementia and recurrent UTIs currently being treated for UTI with 3 day course of Amox, here for urinary retention and suprapubic abd discomfort. Foley was inserted by sNF this am. abd benign. POCUS with 26cc in bladder. Urine yellow and mildly cloudy. UA still with evidence of infection. Will send for culture to ensure sensitivity. Patient still needs to complete her antibiotic course. We'll not change antibiotics at this time..  I have personally seen and examined the patient and discussed plan of care with the APP.  I was present for entire procedure.  I have reviewed the appropriate documentation on PMH/FH/Soc. History.  I have reviewed the documentation of the APP and agree.          Fatima Blank, MD 10/08/16 [phone]

## 2016-10-07 NOTE — ED Notes (Signed)
Bed: ZM08 Expected date:  Expected time:  Means of arrival:  Comments: EMS-urinary retention

## 2016-10-07 NOTE — Progress Notes (Signed)
Location:  [facility] Number: 32 Place of Service:  SNF [phone]) Provider: Dinah Ngetich FNP-C  Blanchie Serve, MD  Patient Care Team: Blanchie Serve, MD as PCP - General (Internal Medicine) Jettie Booze, MD as Consulting Physician (Cardiology) Marchia Bond, MD as Consulting Physician (Orthopedic Surgery) Kathrynn Ducking, MD as Consulting Physician (Neurology) Brand Males, MD as Consulting Physician (Pulmonary Disease) Ngetich, Nelda Bucks, NP as Nurse Practitioner (Family Medicine)  Extended Emergency Contact Information Primary Emergency Contact: [facility] Address: [address]          [place], [place] [address] [place] Phone: [phone] Mobile Phone: [phone] Relation: Son Secondary Emergency Contact: [place] Mobile Phone: [phone] Relation: Son  Code Status: DNR Goals of care: Advanced Directive information Advanced Directives 10/07/2016  Does Patient Have a Medical Advance Directive? Yes  Type of Advance Directive Out of facility DNR (pink MOST or yellow form);Living will;Healthcare Power of Attorney  Does patient want to make changes to medical advance directive? -  Copy of [place] in Chart? Yes  Pre-existing out of facility DNR order (yellow form or pink MOST form) -     Chief Complaint  Patient presents with  . Acute Visit    Skin tear    HPI:  Pt is a 83 y.o. female seen today at [facility] for an acute visit for evaluation of left forearm skin tear.She is seen in her room today.She complains of nausea.she is currently on Amoxicillin 250 mg Tablet three times daily started 10/05/2016 by On call provider for U/A Nitrite positive urine cultures  > 100,000 colonies of E.coli.she denies any fever, chills, constipation or diarrhea. Facility Nurse reports patient sustained left forearm skin tear. Site was cleansed with saline,  edges well approximated and steri strips applied per facility standing orders. She complains of lower abdominal pain, tender to touch with feeling of voiding with palpation. Bladder distention noted. Foley Catheter inserted 100cc of cloudy yellow urine with sediments noted.    Past Medical History:  Diagnosis Date  . Alzheimer's disease 06/22/2015  . Anemia    iron deficient  . Anemia   . Anxiety   . Chronic airway obstruction, not elsewhere classified   . Chronic back pain   . Chronic cystitis   . Cystitis, chronic    Dr Gaynelle Arabian  . Degenerative arthritis   . Depression   . Disturbance of skin sensation   . Disturbance of skin sensation   . Dyslipidemia   . Dyspnea    Dr Chase Caller  . Esophageal reflux   . Essential and other specified forms of tremor 12/11/2012  . Foot drop, bilateral 12/21/2013  . Gait disorder   . GERD (gastroesophageal reflux disease)   . Hemorrhoids   . Hereditary and idiopathic peripheral neuropathy 10/11/2015  . Hernia   . History of cerebrovascular disease    Moderate level small vessel disease  . Hyperlipidemia    mild  . Hypertension   . Osteoarthritis    right knee injection per Dr Trudie Reed  . Osteoporosis   . Other abnormal clinical finding   . Pain in joint, upper arm   . Peripheral vascular disease ([place])   . Rheumatoid arthritis ([place])    Dr Trudie Reed  . Tobacco use disorder   . Tremor    and gait disorder--Dr Jannifer Franklin  . Unspecified vitamin D deficiency    Past Surgical History:  Procedure Laterality  Date  . APPENDECTOMY    . BACK SURGERY Bilateral    X2  . Bladder resuspension procedure    . CATARACT EXTRACTION Bilateral   . FEMUR IM NAIL Right 02/22/2014   Procedure: INTRAMEDULLARY (IM) NAIL FEMORAL;  Surgeon: Johnny Bridge, MD;  Location: [facility];  Service: Orthopedics;  Laterality: Right;  . INGUINAL HERNIA REPAIR Bilateral   . ORIF ANKLE FRACTURE Left 09/19/2016   Procedure: OPEN REDUCTION INTERNAL FIXATION (ORIF) ANKLE FRACTURE;   Surgeon: Vickey Huger, MD;  Location: [facility];  Service: Orthopedics;  Laterality: Left;  . TONSILLECTOMY      Allergies  Allergen Reactions  . Novocain [Procaine]     Passed  out  . Limbrel [Flavocoxid]     Dizziness   . Sertraline Anxiety    Allergies as of 10/07/2016      Reactions   Novocain [procaine]    Passed  out   Limbrel [flavocoxid]    Dizziness   Sertraline Anxiety      Medication List       Accurate as of 10/07/16  1:55 PM. Always use your most recent med list.          acetaminophen 650 MG CR tablet Commonly known as:  TYLENOL Take 650 mg by mouth 2 (two) times daily.   alum & mag hydroxide-simeth 200-200-20 MG/5ML suspension Commonly known as:  MAALOX/MYLANTA Take 30 mLs by mouth every 4 (four) hours as needed for indigestion or heartburn.   CALTRATE 600+D 600-400 MG-UNIT tablet Generic drug:  Calcium Carbonate-Vitamin D Take 1 tablet by mouth daily.   enoxaparin 30 MG/0.3ML injection Commonly known as:  LOVENOX Inject 0.3 mLs (30 mg total) into the skin daily.   furosemide 20 MG tablet Commonly known as:  LASIX Take 20 mg by mouth daily.   HYDROcodone-acetaminophen 5-325 MG tablet Commonly known as:  NORCO/VICODIN Take 1 tablet by mouth every 6 (six) hours as needed for moderate pain.   LEVSIN/SL 0.125 MG Subl Generic drug:  Hyoscyamine Sulfate SL Place under the tongue every 12 (twelve) hours as needed (for cramps).   memantine 10 MG tablet Commonly known as:  NAMENDA Take 10 mg by mouth 2 (two) times daily.   multivitamin tablet Take 1 tablet by mouth daily.   OSTEO BI-FLEX TRIPLE STRENGTH Tabs Take 1 tablet by mouth daily.   ranitidine 150 MG tablet Commonly known as:  ZANTAC Take 150 mg by mouth at bedtime.   RESOURCE 2.0 Liqd Take by mouth. 55ml twice daily   sennosides-docusate sodium 8.6-50 MG tablet Commonly known as:  SENOKOT-S Take 2 tablets by mouth daily.   Vitamin D3 10000 units Tabs Take 1 tablet by mouth  daily.       Review of Systems  Constitutional: Negative for activity change, appetite change, chills, fatigue and fever.  Respiratory: Negative for cough, chest tightness, shortness of breath and wheezing.   Cardiovascular: Negative for chest pain, palpitations and leg swelling.  Gastrointestinal: Positive for abdominal distention, abdominal pain and nausea. Negative for constipation, diarrhea and vomiting.  Genitourinary: Positive for urgency. Negative for dyspareunia, dysuria, flank pain and frequency.  Skin: Negative for color change, pallor and rash.  Neurological: Negative for dizziness, syncope, light-headedness and headaches.  Psychiatric/Behavioral: Positive for confusion. Negative for agitation, hallucinations and sleep disturbance.    Immunization History  Administered Date(s) Administered  . Influenza Whole 01/16/2010  . Influenza-Unspecified 02/03/2006, 01/09/2007, 01/05/2013, 12/22/2014, 12/28/2015  . PPD Test 05/01/2004, 02/24/2014  . Pneumococcal Polysaccharide-23 01/21/2006  .  Pneumococcal-Unspecified 12/19/1998  . Td 03/18/1998  . Tdap 09/18/2016   Pertinent  Health Maintenance Due  Topic Date Due  . PNA vac Low Risk Adult (2 of 2 - PCV13) 03/18/2017 (Originally 01/22/2007)  . INFLUENZA VACCINE  10/16/2016  . DEXA SCAN  Completed   Fall Risk  06/20/2015  Falls in the past year? No  Risk for fall due to : Impaired balance/gait    Vitals:   10/07/16 1106  BP: (!) 121/93  Pulse: 80  Resp: (!) 21  Temp: 97.6 F (36.4 C)  SpO2: 98%  Weight: 122 lb (55.3 kg)  Height: 5\' 1"  (1.549 m)   Body mass index is 23.05 kg/m. Physical Exam  Constitutional: She appears well-developed and well-nourished. No distress.  HENT:  Head: Normocephalic.  Mouth/Throat: Oropharynx is clear and moist. No oropharyngeal exudate.  Eyes: Pupils are equal, round, and reactive to light. Conjunctivae and EOM are normal. Right eye exhibits no discharge. Left eye exhibits no discharge.  No scleral icterus.     Neck: Normal range of motion. No JVD present. No thyromegaly present.  Cardiovascular: Intact distal pulses.  Exam reveals no gallop and no friction rub.   Murmur heard. Pulmonary/Chest: Effort normal and breath sounds normal. No respiratory distress. She has no wheezes. She has no rales.  Abdominal: Soft. Bowel sounds are normal. There is no rebound and no guarding.  Suprapubic tender to palpation with bladder distention noted.   Musculoskeletal: She exhibits no tenderness or deformity.  Unsteady gait. Left leg boot in place.   Lymphadenopathy:    She has no cervical adenopathy.  Neurological: She is alert. Coordination normal.  Skin: Skin is warm and dry. No rash noted. No erythema. No pallor.  1. Left fore arm skin tear; Steri-strips dry, clean and intact.Surrounding skin tissue without any signs of infections.   2.Left leg dressing wound; dry, clean and intact. Wound not visualized this visit.   Psychiatric: She has a normal mood and affect.    Labs reviewed:  Recent Labs  09/18/16 2232 09/18/16 2239 09/26/16  NA 137 136 139  K 4.6 4.5 4.5  CL 99* 96*  --   CO2 33*  --   --   GLUCOSE 115* 110*  --   BUN 24* 30* 16  CREATININE 1.06* 1.00 0.9  CALCIUM 9.4  --   --     Recent Labs  11/17/15 05/16/16  AST 17 16  ALT 11 7  ALKPHOS 71 70    Recent Labs  05/16/16 09/18/16 2232 09/18/16 2239 09/26/16  WBC 6.9 11.9*  --  10.5  NEUTROABS  --  8.4*  --   --   HGB 14.3 13.0 13.3 13.2  HCT 44 39.5 39.0 40  MCV  --  99.7  --   --   PLT 260 358  --  447*   Lab Results  Component Value Date   TSH 1.49 05/16/2016    Significant Diagnostic Results in last 30 days:  Dg Ankle 2 Views Left  Result Date: 09/19/2016 CLINICAL DATA:  Left ankle fracture fixation. EXAM: DG C-ARM 61-120 MIN; LEFT ANKLE - 2 VIEW COMPARISON:  09/19/2016 FINDINGS: Intraoperative fluoroscopic images from fixation of medial and lateral malleolar fractures demonstrate sideplate  and screw fixation of the distal fibula and cancellous screw fixation of the medial malleolus. The alignment is anatomic. No new fractures are seen. Soft tissue swelling. IMPRESSION: Intraoperative fluoroscopic images from fixation of the distal tibia and fibula. Fluoroscopy time 30  seconds. Electronically Signed   By: Fidela Salisbury M.D.   On: 09/19/2016 17:06   Ct Head Wo Contrast  Result Date: 09/18/2016 CLINICAL DATA:  Dementia patient post unwitnessed fall landing on left side. Struck head on bed. EXAM: CT HEAD WITHOUT CONTRAST CT CERVICAL SPINE WITHOUT CONTRAST TECHNIQUE: Multidetector CT imaging of the head and cervical spine was performed following the standard protocol without intravenous contrast. Multiplanar CT image reconstructions of the cervical spine were also generated. COMPARISON:  Head CT 02/21/2014 FINDINGS: CT HEAD FINDINGS Brain: No evidence of acute infarction, hemorrhage, hydrocephalus, extra-axial collection or mass lesion/mass effect. Stable ventriculomegaly and ventricular morphology. Stable advanced chronic small vessel ischemia. Remote lacunar infarct in the right caudate. Vascular: Atherosclerosis of skullbase vasculature without hyperdense vessel or abnormal calcification. Skull: No skull fracture.  No focal lesion. Sinuses/Orbits: Paranasal sinuses and mastoid air cells are clear. The visualized orbits are unremarkable. Bilateral cataract resection. Other: None. CT CERVICAL SPINE FINDINGS Alignment: No traumatic malalignment. Minimal anterolisthesis of C7 on T1, retrolisthesis of C5 on C6, and anterolisthesis of C3 on C4 appears degenerative. This at the normally aligned. Skull base and vertebrae: No acute fracture. Skullbase and dens are intact. Vertebral body marrow heterogeneity likely secondary to osteopenia. No discrete focal lesion. Soft tissues and spinal canal: No prevertebral fluid or swelling. No visible canal hematoma. Disc levels: Diffuse disc space narrowing and  endplate spurring, most prominent at C5-C6. Multilevel facet arthropathy. Upper chest: No acute abnormality. Atherosclerosis of the visualized thoracic aorta. Other: Carotid calcifications. IMPRESSION: 1. No acute intracranial abnormality. No skull fracture. Stable chronic change. 2. Multilevel degenerative change in the cervical spine without acute fracture or subluxation. Electronically Signed   By: Jeb Levering M.D.   On: 09/18/2016 23:57   Ct Cervical Spine Wo Contrast  Result Date: 09/18/2016 CLINICAL DATA:  Dementia patient post unwitnessed fall landing on left side. Struck head on bed. EXAM: CT HEAD WITHOUT CONTRAST CT CERVICAL SPINE WITHOUT CONTRAST TECHNIQUE: Multidetector CT imaging of the head and cervical spine was performed following the standard protocol without intravenous contrast. Multiplanar CT image reconstructions of the cervical spine were also generated. COMPARISON:  Head CT 02/21/2014 FINDINGS: CT HEAD FINDINGS Brain: No evidence of acute infarction, hemorrhage, hydrocephalus, extra-axial collection or mass lesion/mass effect. Stable ventriculomegaly and ventricular morphology. Stable advanced chronic small vessel ischemia. Remote lacunar infarct in the right caudate. Vascular: Atherosclerosis of skullbase vasculature without hyperdense vessel or abnormal calcification. Skull: No skull fracture.  No focal lesion. Sinuses/Orbits: Paranasal sinuses and mastoid air cells are clear. The visualized orbits are unremarkable. Bilateral cataract resection. Other: None. CT CERVICAL SPINE FINDINGS Alignment: No traumatic malalignment. Minimal anterolisthesis of C7 on T1, retrolisthesis of C5 on C6, and anterolisthesis of C3 on C4 appears degenerative. This at the normally aligned. Skull base and vertebrae: No acute fracture. Skullbase and dens are intact. Vertebral body marrow heterogeneity likely secondary to osteopenia. No discrete focal lesion. Soft tissues and spinal canal: No prevertebral  fluid or swelling. No visible canal hematoma. Disc levels: Diffuse disc space narrowing and endplate spurring, most prominent at C5-C6. Multilevel facet arthropathy. Upper chest: No acute abnormality. Atherosclerosis of the visualized thoracic aorta. Other: Carotid calcifications. IMPRESSION: 1. No acute intracranial abnormality. No skull fracture. Stable chronic change. 2. Multilevel degenerative change in the cervical spine without acute fracture or subluxation. Electronically Signed   By: Jeb Levering M.D.   On: 09/18/2016 23:57   Dg Chest Port 1 View  Result Date: 09/18/2016 CLINICAL DATA:  Fall at home with open ankle fracture. EXAM: PORTABLE CHEST 1 VIEW COMPARISON:  Radiograph 02/21/2014 FINDINGS: Low lung volumes limit assessment. Mild cardiomegaly. Normal mediastinal contours. No pulmonary edema. Possible blunting of left costophrenic angle versus soft tissue attenuation. No consolidation or pneumothorax. Postsurgical change in the lower thoracic and lumbar spine, partially included. No acute osseous abnormalities are seen. The bones appear under mineralized. IMPRESSION: Low lung volumes with mild cardiomegaly. Possible small left pleural effusion. Electronically Signed   By: Jeb Levering M.D.   On: 09/18/2016 22:52   Dg Ankle Left Port  Result Date: 09/19/2016 CLINICAL DATA:  Fracture, postreduction. EXAM: PORTABLE LEFT ANKLE - 2 VIEW COMPARISON:  Pre reduction radiographs yesterday. FINDINGS: Improved alignment of fracture subluxation of the left ankle postreduction. Tibial talar alignment is improved and near anatomic. Decrease displacement of medial malleolar and distal fibular fractures. Posterior tibial tubercle fracture is tentatively identified. Overlying splint material in place. IMPRESSION: Improved alignment postreduction with decrease displacement of medial malleolar and distal fibular fractures in improved tibial talar alignment. Question of posterior tibial tubercle fracture.  Electronically Signed   By: Jeb Levering M.D.   On: 09/19/2016 01:08   Dg Ankle Left Port  Result Date: 09/18/2016 CLINICAL DATA:  Fall at nursing home with ankle deformity. EXAM: PORTABLE LEFT ANKLE - 2 VIEW COMPARISON:  None. FINDINGS: Ankle fracture dislocation. The talus is displaced laterally with apex medial angulation with respect to the tibial plafond. Displaced transverse medial malleolar fracture with distal fragment remaining aligned with the talus. Oblique distal fibular fracture with angulation, distal fragment remains aligned with the talus. No definite posterior tibial tubercle fracture. IMPRESSION: Bimalleolar ankle fracture dislocation. Lateral displacement of the talus with fractures of the medial malleolus and distal fibula, distal fracture fragment remain aligned with the talus. No definite posterior tibial tubercle fracture is seen. Electronically Signed   By: Jeb Levering M.D.   On: 09/18/2016 22:56   Dg C-arm 1-60 Min  Result Date: 09/19/2016 CLINICAL DATA:  Left ankle fracture fixation. EXAM: DG C-ARM 61-120 MIN; LEFT ANKLE - 2 VIEW COMPARISON:  09/19/2016 FINDINGS: Intraoperative fluoroscopic images from fixation of medial and lateral malleolar fractures demonstrate sideplate and screw fixation of the distal fibula and cancellous screw fixation of the medial malleolus. The alignment is anatomic. No new fractures are seen. Soft tissue swelling. IMPRESSION: Intraoperative fluoroscopic images from fixation of the distal tibia and fibula. Fluoroscopy time 30 seconds. Electronically Signed   By: Fidela Salisbury M.D.   On: 09/19/2016 17:06   Assessment/Plan 1. Acute urinary retention Afebrile. bladder distended, tender to touch with palpation. Reports urge to void. Foley Catheter inserted by facility Nurse with 100 cc of yellow, cloudy urine with sediments obtain. Patient continues to have urge to void despite foley catheter insertion. Bladder continues to be distended and  tender to touch. Foley catheter flushed without any difficulty by Nurse but no urine drained. Discussed patient's output with Dr. Bubba Camp who recommends patient send to ER for evaluation of urinary retention.   2. Skin tear of left forearm without complication, initial encounter edges well approximated steri-strips applied per standing orders. Continue to monitor for signs of infections.   3. Acute cystitis without hematuria Afebrile. currently on Amoxicillin 250 mg Tablet three times daily started 10/05/2016 by On call provider for U/A Nitrite positive and urine cultures  > 100,000 colonies of E.coli. On further reviewed of U/A and C/S results no amoxicillin sensitivity but  Amoxicillin/Clavunate noted. Cipro was more sensitivity to E.Coli  on the cultures. Will discontinue Amoxicillin 250 mg tablet then start on Cipro 500 mg Tablet twice daily X 7 days. Add florastor 250 mg Capsule one by mouth twice daily x 10 days. Will Send to ER for evaluation of urinary retention despite indwelling Catheter.   Family/ staff Communication: Reviewed plan of care with Dr. Mikeal Hawthorne and facility Nurse.   Labs/tests ordered: None   Dinah C Ngetich, NP

## 2016-10-09 LAB — URINE CULTURE

## 2016-10-10 ENCOUNTER — Encounter: Payer: Self-pay | Admitting: Family

## 2016-10-10 ENCOUNTER — Non-Acute Institutional Stay (SKILLED_NURSING_FACILITY): Payer: Medicare Other | Admitting: Family

## 2016-10-10 DIAGNOSIS — N3000 Acute cystitis without hematuria: Secondary | ICD-10-CM | POA: Diagnosis not present

## 2016-10-10 DIAGNOSIS — R339 Retention of urine, unspecified: Secondary | ICD-10-CM | POA: Diagnosis not present

## 2016-10-10 NOTE — Progress Notes (Signed)
Location:  [facility] Room Number: 32 Place of Service:  SNF (31) Provider: Dinah Ngetich FNP-C  Blanchie Serve, MD  Patient Care Team: Blanchie Serve, MD as PCP - General (Internal Medicine) Jettie Booze, MD as Consulting Physician (Cardiology) Marchia Bond, MD as Consulting Physician (Orthopedic Surgery) Kathrynn Ducking, MD as Consulting Physician (Neurology) Brand Males, MD as Consulting Physician (Pulmonary Disease) Ngetich, Nelda Bucks, NP as Nurse Practitioner (Family Medicine)  Extended Emergency Contact Information Primary Emergency Contact: [place] of [place] Phone: [phone] Relation: Son Secondary Emergency Contact: Ricardo Jericho [place] of [place] Phone: [phone] Mobile Phone: [phone] Relation: Daughter  Code Status:  DNR  Goals of care: Advanced Directive information Advanced Directives 10/10/2016  Does Patient Have a Medical Advance Directive? Yes  Type of Advance Directive Out of facility DNR (pink MOST or yellow form);[place];Living will  Does patient want to make changes to medical advance directive? -  Copy of [place] in Chart? Yes  Pre-existing out of facility DNR order (yellow form or pink MOST form) Yellow form placed in chart (order not valid for inpatient use)     Chief Complaint  Patient presents with  . Hospitalization Follow-up    ER follow up for urine retention    HPI:  Pt is a 83 y.o. female seen today at [facility] for an acute visit for ER follow up for urine retention. She is seen in her room today. She states feeling much better. She is currently on oral Cipro for UTI. Facility Nurse reports foley catheter draining adequate amounts of urine. She denies any fever, chills, nausea or vomiting.     Past Medical History:  Diagnosis Date  . Alzheimer's disease 06/22/2015  . Anemia    iron deficient  .  Anemia   . Anxiety   . Chronic airway obstruction, not elsewhere classified   . Chronic back pain   . Chronic cystitis   . Cystitis, chronic    Dr Gaynelle Arabian  . Degenerative arthritis   . Depression   . Disturbance of skin sensation   . Disturbance of skin sensation   . Dyslipidemia   . Dyspnea    Dr Chase Caller  . Esophageal reflux   . Essential and other specified forms of tremor 12/11/2012  . Foot drop, bilateral 12/21/2013  . Gait disorder   . GERD (gastroesophageal reflux disease)   . Hemorrhoids   . Hereditary and idiopathic peripheral neuropathy 10/11/2015  . Hernia   . History of cerebrovascular disease    Moderate level small vessel disease  . Hyperlipidemia    mild  . Hypertension   . Osteoarthritis    right knee injection per Dr Trudie Reed  . Osteoporosis   . Other abnormal clinical finding   . Pain in joint, upper arm   . Peripheral vascular disease ([place])   . Rheumatoid arthritis ([place])    Dr Trudie Reed  . Tobacco use disorder   . Tremor    and gait disorder--Dr Jannifer Franklin  . Unspecified vitamin D deficiency    Past Surgical History:  Procedure Laterality Date  . APPENDECTOMY    . BACK SURGERY Bilateral    X2  . Bladder resuspension procedure    . CATARACT EXTRACTION Bilateral   . FEMUR IM NAIL Right 02/22/2014   Procedure: INTRAMEDULLARY (IM) NAIL FEMORAL;  Surgeon: Johnny Bridge, MD;  Location: [facility];  Service: Orthopedics;  Laterality: Right;  .  INGUINAL HERNIA REPAIR Bilateral   . ORIF ANKLE FRACTURE Left 09/19/2016   Procedure: OPEN REDUCTION INTERNAL FIXATION (ORIF) ANKLE FRACTURE;  Surgeon: Vickey Huger, MD;  Location: [facility];  Service: Orthopedics;  Laterality: Left;  . TONSILLECTOMY      Allergies  Allergen Reactions  . Novocain [Procaine]     Passed  out  . Limbrel [Flavocoxid]     Dizziness   . Sertraline Anxiety    Allergies as of 10/10/2016      Reactions   Novocain [procaine]    Passed  out   Limbrel [flavocoxid]    Dizziness   Sertraline  Anxiety      Medication List       Accurate as of 10/10/16  3:35 PM. Always use your most recent med list.          acetaminophen 650 MG CR tablet Commonly known as:  TYLENOL Take 650 mg by mouth 2 (two) times daily.   alum & mag hydroxide-simeth 200-200-20 MG/5ML suspension Commonly known as:  MAALOX/MYLANTA Take 30 mLs by mouth every 4 (four) hours as needed for indigestion or heartburn.   CALTRATE 600+D 600-400 MG-UNIT tablet Generic drug:  Calcium Carbonate-Vitamin D Take 1 tablet by mouth daily.   ciprofloxacin 500 MG tablet Commonly known as:  CIPRO Take 500 mg by mouth 2 (two) times daily.   enoxaparin 30 MG/0.3ML injection Commonly known as:  LOVENOX Inject 0.3 mLs (30 mg total) into the skin daily.   furosemide 20 MG tablet Commonly known as:  LASIX Take 20 mg by mouth daily.   HYDROcodone-acetaminophen 5-325 MG tablet Commonly known as:  NORCO/VICODIN Take 1 tablet by mouth every 6 (six) hours as needed for moderate pain.   LEVSIN/SL 0.125 MG Subl Generic drug:  Hyoscyamine Sulfate SL Place under the tongue every 12 (twelve) hours as needed (for cramps).   memantine 10 MG tablet Commonly known as:  NAMENDA Take 10 mg by mouth 2 (two) times daily.   multivitamin tablet Take 1 tablet by mouth daily.   ondansetron 4 MG tablet Commonly known as:  ZOFRAN Take 4 mg by mouth every 6 (six) hours as needed for nausea.   OSTEO BI-FLEX TRIPLE STRENGTH Tabs Take 1 tablet by mouth daily.   ranitidine 150 MG tablet Commonly known as:  ZANTAC Take 150 mg by mouth at bedtime.   RESOURCE 2.0 Liqd Take by mouth. 56ml twice daily   saccharomyces boulardii 250 MG capsule Commonly known as:  FLORASTOR Take 250 mg by mouth 2 (two) times daily.   sennosides-docusate sodium 8.6-50 MG tablet Commonly known as:  SENOKOT-S Take 2 tablets by mouth daily.   Vitamin D3 10000 units Tabs Take 1 tablet by mouth daily.       Review of Systems  Constitutional:  Negative for activity change, appetite change, chills, fatigue and fever.  Respiratory: Negative for cough, chest tightness, shortness of breath and wheezing.   Cardiovascular: Negative for chest pain, palpitations and leg swelling.  Gastrointestinal: Negative for abdominal distention, abdominal pain, constipation, diarrhea, nausea and vomiting.  Genitourinary: Negative for flank pain, hematuria and urgency.  Skin: Negative for color change, pallor and rash.  Neurological: Negative for dizziness, syncope, light-headedness and headaches.  Psychiatric/Behavioral: Negative for agitation, confusion, hallucinations and sleep disturbance.    Immunization History  Administered Date(s) Administered  . Influenza Whole 01/16/2010  . Influenza-Unspecified 02/03/2006, 01/09/2007, 01/05/2013, 12/22/2014, 12/28/2015  . PPD Test 05/01/2004, 02/24/2014  . Pneumococcal Polysaccharide-23 01/21/2006  . Pneumococcal-Unspecified 12/19/1998  .  Td 03/18/1998  . Tdap 09/18/2016   Pertinent  Health Maintenance Due  Topic Date Due  . PNA vac Low Risk Adult (2 of 2 - PCV13) 03/18/2017 (Originally 01/22/2007)  . INFLUENZA VACCINE  10/16/2016  . DEXA SCAN  Completed   Fall Risk  06/20/2015  Falls in the past year? No  Risk for fall due to : Impaired balance/gait    Vitals:   10/10/16 1035  BP: 130/70  Pulse: 76  Resp: 18  Temp: (!) 97 F (36.1 C)  SpO2: 93%  Weight: 122 lb 6.4 oz (55.5 kg)  Height: 5\' 2"  (1.575 m)   Body mass index is 22.39 kg/m. Physical Exam  Constitutional: She appears well-developed and well-nourished. No distress.  HENT:  Head: Normocephalic.  Mouth/Throat: Oropharynx is clear and moist. No oropharyngeal exudate.  Eyes: Pupils are equal, round, and reactive to light. Conjunctivae and EOM are normal. Right eye exhibits no discharge. Left eye exhibits no discharge. No scleral icterus.     Neck: Normal range of motion. No JVD present. No thyromegaly present.  Cardiovascular:  Intact distal pulses.  Exam reveals no gallop and no friction rub.   Murmur heard. Pulmonary/Chest: Effort normal and breath sounds normal. No respiratory distress. She has no wheezes. She has no rales.  Abdominal: Soft. Bowel sounds are normal. There is no rebound and no guarding.  Suprapubic tender to palpation with bladder distention noted.   Musculoskeletal: She exhibits no tenderness or deformity.  Unsteady gait uses wheelchair. Right leg boot in place.   Lymphadenopathy:    She has no cervical adenopathy.  Neurological: She is alert. Coordination normal.  Skin: Skin is warm and dry. No rash noted. No erythema. No pallor.  Right leg boot in place wound drsg dry, clean and intact.   Psychiatric: She has a normal mood and affect.   Labs reviewed:  Recent Labs  09/18/16 2232 09/18/16 2239 09/26/16  NA 137 136 139  K 4.6 4.5 4.5  CL 99* 96*  --   CO2 33*  --   --   GLUCOSE 115* 110*  --   BUN 24* 30* 16  CREATININE 1.06* 1.00 0.9  CALCIUM 9.4  --   --     Recent Labs  11/17/15 05/16/16  AST 17 16  ALT 11 7  ALKPHOS 71 70    Recent Labs  05/16/16 09/18/16 2232 09/18/16 2239 09/26/16  WBC 6.9 11.9*  --  10.5  NEUTROABS  --  8.4*  --   --   HGB 14.3 13.0 13.3 13.2  HCT 44 39.5 39.0 40  MCV  --  99.7  --   --   PLT 260 358  --  447*   Lab Results  Component Value Date   TSH 1.49 05/16/2016    Assessment/Plan  Urine retention Afebrile. Foley catheter draining adequate amounts of clear yellow urine. Currently on Cipro for recent  UTI.Examine findings negative for suprapubic distension or tenderness. Facility Nurse to  Attempt voiding trial. Notify provider if no void in 6 hours.   Urinary Tract infection  Afebrile. Confusion has improved.Complete course of Cipro. Continue to encourage fluid intake.   Family/ staff Communication: Reviewed plan of care with patient and facility Nurse supervisor  Labs/tests ordered: None   Sandrea Hughs, NP

## 2016-10-22 DIAGNOSIS — S82852D Displaced trimalleolar fracture of left lower leg, subsequent encounter for closed fracture with routine healing: Secondary | ICD-10-CM | POA: Diagnosis not present

## 2016-10-23 DIAGNOSIS — D0462 Carcinoma in situ of skin of left upper limb, including shoulder: Secondary | ICD-10-CM | POA: Diagnosis not present

## 2016-10-23 DIAGNOSIS — C44622 Squamous cell carcinoma of skin of right upper limb, including shoulder: Secondary | ICD-10-CM | POA: Diagnosis not present

## 2016-10-28 ENCOUNTER — Non-Acute Institutional Stay (SKILLED_NURSING_FACILITY): Payer: Medicare Other

## 2016-10-28 DIAGNOSIS — Z0000 Encounter for general adult medical examination without abnormal findings: Secondary | ICD-10-CM

## 2016-10-28 NOTE — Patient Instructions (Signed)
[name] , Thank you for taking time to come for your Medicare Wellness Visit. I appreciate your ongoing commitment to your health goals. Please review the following plan we discussed and let me know if I can assist you in the future.   Screening recommendations/referrals: Colonoscopy excluded, pt over age 81 Mammogram excluded, pt over age 32 Bone Density up to date Recommended yearly ophthalmology/optometry visit for glaucoma screening and checkup Recommended yearly dental visit for hygiene and checkup  Vaccinations: Influenza vaccine due 2018 fall season Pneumococcal vaccine up to date Tdap vaccine up to date. Due 09/19/26 Shingles vaccine not in records  Advanced directives: In Chart  Conditions/risks identified: None  Next appointment: Dr. Bubba Camp makes rounds   Preventive Care 72 Years and Older, Female Preventive care refers to lifestyle choices and visits with your health care provider that can promote health and wellness. What does preventive care include?  A yearly physical exam. This is also called an annual well check.  Dental exams once or twice a year.  Routine eye exams. Ask your health care provider how often you should have your eyes checked.  Personal lifestyle choices, including:  Daily care of your teeth and gums.  Regular physical activity.  Eating a healthy diet.  Avoiding tobacco and drug use.  Limiting alcohol use.  Practicing safe sex.  Taking low-dose aspirin every day.  Taking vitamin and mineral supplements as recommended by your health care provider. What happens during an annual well check? The services and screenings done by your health care provider during your annual well check will depend on your age, overall health, lifestyle risk factors, and family history of disease. Counseling  Your health care provider may ask you questions about your:  Alcohol use.  Tobacco use.  Drug use.  Emotional well-being.  Home and  relationship well-being.  Sexual activity.  Eating habits.  History of falls.  Memory and ability to understand (cognition).  Work and work Statistician.  Reproductive health. Screening  You may have the following tests or measurements:  Height, weight, and BMI.  Blood pressure.  Lipid and cholesterol levels. These may be checked every 5 years, or more frequently if you are over 28 years old.  Skin check.  Lung cancer screening. You may have this screening every year starting at age 22 if you have a 30-pack-year history of smoking and currently smoke or have quit within the past 15 years.  Fecal occult blood test (FOBT) of the stool. You may have this test every year starting at age 46.  Flexible sigmoidoscopy or colonoscopy. You may have a sigmoidoscopy every 5 years or a colonoscopy every 10 years starting at age 27.  Hepatitis C blood test.  Hepatitis B blood test.  Sexually transmitted disease (STD) testing.  Diabetes screening. This is done by checking your blood sugar (glucose) after you have not eaten for a while (fasting). You may have this done every 1-3 years.  Bone density scan. This is done to screen for osteoporosis. You may have this done starting at age 23.  Mammogram. This may be done every 1-2 years. Talk to your health care provider about how often you should have regular mammograms. Talk with your health care provider about your test results, treatment options, and if necessary, the need for more tests. Vaccines  Your health care provider may recommend certain vaccines, such as:  Influenza vaccine. This is recommended every year.  Tetanus, diphtheria, and acellular pertussis (Tdap, Td) vaccine. You may need  a Td booster every 10 years.  Zoster vaccine. You may need this after age 28.  Pneumococcal 13-valent conjugate (PCV13) vaccine. One dose is recommended after age 56.  Pneumococcal polysaccharide (PPSV23) vaccine. One dose is recommended after  age 73. Talk to your health care provider about which screenings and vaccines you need and how often you need them. This information is not intended to replace advice given to you by your health care provider. Make sure you discuss any questions you have with your health care provider. Document Released: 03/31/2015 Document Revised: 11/22/2015 Document Reviewed: 01/03/2015 Elsevier Interactive Patient Education  2017 [employer] Prevention in the Home Falls can cause injuries. They can happen to people of all ages. There are many things you can do to make your home safe and to help prevent falls. What can I do on the outside of my home?  Regularly fix the edges of walkways and driveways and fix any cracks.  Remove anything that might make you trip as you walk through a door, such as a raised step or threshold.  Trim any bushes or trees on the path to your home.  Use bright outdoor lighting.  Clear any walking paths of anything that might make someone trip, such as rocks or tools.  Regularly check to see if handrails are loose or broken. Make sure that both sides of any steps have handrails.  Any raised decks and porches should have guardrails on the edges.  Have any leaves, snow, or ice cleared regularly.  Use sand or salt on walking paths during winter.  Clean up any spills in your garage right away. This includes oil or grease spills. What can I do in the bathroom?  Use night lights.  Install grab bars by the toilet and in the tub and shower. Do not use towel bars as grab bars.  Use non-skid mats or decals in the tub or shower.  If you need to sit down in the shower, use a plastic, non-slip stool.  Keep the floor dry. Clean up any water that spills on the floor as soon as it happens.  Remove soap buildup in the tub or shower regularly.  Attach bath mats securely with double-sided non-slip rug tape.  Do not have throw rugs and other things on the floor that can  make you trip. What can I do in the bedroom?  Use night lights.  Make sure that you have a light by your bed that is easy to reach.  Do not use any sheets or blankets that are too big for your bed. They should not hang down onto the floor.  Have a firm chair that has side arms. You can use this for support while you get dressed.  Do not have throw rugs and other things on the floor that can make you trip. What can I do in the kitchen?  Clean up any spills right away.  Avoid walking on wet floors.  Keep items that you use a lot in easy-to-reach places.  If you need to reach something above you, use a strong step stool that has a grab bar.  Keep electrical cords out of the way.  Do not use floor polish or wax that makes floors slippery. If you must use wax, use non-skid floor wax.  Do not have throw rugs and other things on the floor that can make you trip. What can I do with my stairs?  Do not leave any items on the  stairs.  Make sure that there are handrails on both sides of the stairs and use them. Fix handrails that are broken or loose. Make sure that handrails are as long as the stairways.  Check any carpeting to make sure that it is firmly attached to the stairs. Fix any carpet that is loose or worn.  Avoid having throw rugs at the top or bottom of the stairs. If you do have throw rugs, attach them to the floor with carpet tape.  Make sure that you have a light switch at the top of the stairs and the bottom of the stairs. If you do not have them, ask someone to add them for you. What else can I do to help prevent falls?  Wear shoes that:  Do not have high heels.  Have rubber bottoms.  Are comfortable and fit you well.  Are closed at the toe. Do not wear sandals.  If you use a stepladder:  Make sure that it is fully opened. Do not climb a closed stepladder.  Make sure that both sides of the stepladder are locked into place.  Ask someone to hold it for you,  if possible.  Clearly mark and make sure that you can see:  Any grab bars or handrails.  First and last steps.  Where the edge of each step is.  Use tools that help you move around (mobility aids) if they are needed. These include:  Canes.  Walkers.  Scooters.  Crutches.  Turn on the lights when you go into a dark area. Replace any light bulbs as soon as they burn out.  Set up your furniture so you have a clear path. Avoid moving your furniture around.  If any of your floors are uneven, fix them.  If there are any pets around you, be aware of where they are.  Review your medicines with your doctor. Some medicines can make you feel dizzy. This can increase your chance of falling. Ask your doctor what other things that you can do to help prevent falls. This information is not intended to replace advice given to you by your health care provider. Make sure you discuss any questions you have with your health care provider. Document Released: 12/29/2008 Document Revised: 08/10/2015 Document Reviewed: 04/08/2014 Elsevier Interactive Patient Education  2017 [employer].

## 2016-10-28 NOTE — Progress Notes (Signed)
Subjective:   [name] is a 83 y.o. female who presents for Medicare Annual (Subsequent) preventive examination at [facility]; incapacitated patient unable to answer questions appropriately  Last AWV-10/19/10     Objective:     Vitals: BP 110/70 (BP Location: Left Arm, Patient Position: Sitting)   Pulse 69   Temp (!) 97.5 F (36.4 C) (Oral)   Ht 5\' 2"  (1.575 m)   Wt 122 lb (55.3 kg)   BMI 22.31 kg/m   Body mass index is 22.31 kg/m.   Tobacco History  Smoking Status  . Former Smoker  Smokeless Tobacco  . Never Used    Comment: "very few years"     Counseling given: Not Answered   Past Medical History:  Diagnosis Date  . Alzheimer's disease 06/22/2015  . Anemia    iron deficient  . Anemia   . Anxiety   . Chronic airway obstruction, not elsewhere classified   . Chronic back pain   . Chronic cystitis   . Cystitis, chronic    Dr Gaynelle Arabian  . Degenerative arthritis   . Depression   . Disturbance of skin sensation   . Disturbance of skin sensation   . Dyslipidemia   . Dyspnea    Dr Chase Caller  . Esophageal reflux   . Essential and other specified forms of tremor 12/11/2012  . Foot drop, bilateral 12/21/2013  . Gait disorder   . GERD (gastroesophageal reflux disease)   . Hemorrhoids   . Hereditary and idiopathic peripheral neuropathy 10/11/2015  . Hernia   . History of cerebrovascular disease    Moderate level small vessel disease  . Hyperlipidemia    mild  . Hypertension   . Osteoarthritis    right knee injection per Dr Trudie Reed  . Osteoporosis   . Other abnormal clinical finding   . Pain in joint, upper arm   . Peripheral vascular disease ([place])   . Rheumatoid arthritis ([place])    Dr Trudie Reed  . Tobacco use disorder   . Tremor    and gait disorder--Dr Jannifer Franklin  . Unspecified vitamin D deficiency    Past Surgical History:  Procedure Laterality Date  . APPENDECTOMY    . BACK SURGERY Bilateral    X2  . Bladder resuspension  procedure    . CATARACT EXTRACTION Bilateral   . FEMUR IM NAIL Right 02/22/2014   Procedure: INTRAMEDULLARY (IM) NAIL FEMORAL;  Surgeon: Johnny Bridge, MD;  Location: [facility];  Service: Orthopedics;  Laterality: Right;  . INGUINAL HERNIA REPAIR Bilateral   . ORIF ANKLE FRACTURE Left 09/19/2016   Procedure: OPEN REDUCTION INTERNAL FIXATION (ORIF) ANKLE FRACTURE;  Surgeon: Vickey Huger, MD;  Location: [facility];  Service: Orthopedics;  Laterality: Left;  . TONSILLECTOMY     Family History  Problem Relation Age of Onset  . Heart Problems Mother   . Diabetes Father   . Heart attack Father   . Cancer Sister        Brain tumor  . Emphysema Brother    History  Sexual Activity  . Sexual activity: No    Outpatient Encounter Prescriptions as of 10/28/2016  Medication Sig  . acetaminophen (TYLENOL) 650 MG CR tablet Take 650 mg by mouth 2 (two) times daily.   Marland Kitchen alum & mag hydroxide-simeth (MAALOX/MYLANTA) 200-200-20 MG/5ML suspension Take 30 mLs by mouth every 4 (four) hours as needed for indigestion or heartburn.  . Calcium Carbonate-Vitamin D (CALTRATE 600+D) 600-400 MG-UNIT per tablet  Take 1 tablet by mouth daily.  . Cholecalciferol (VITAMIN D3) 10000 units TABS Take 1 tablet by mouth daily.  . furosemide (LASIX) 20 MG tablet Take 20 mg by mouth daily.   Marland Kitchen HYDROcodone-acetaminophen (NORCO/VICODIN) 5-325 MG tablet Take 1 tablet by mouth every 6 (six) hours as needed for moderate pain.  Marland Kitchen Hyoscyamine Sulfate SL (LEVSIN/SL) 0.125 MG SUBL Place under the tongue every 12 (twelve) hours as needed (for cramps).   . memantine (NAMENDA) 10 MG tablet Take 10 mg by mouth 2 (two) times daily.  . Misc Natural Products (OSTEO BI-FLEX TRIPLE STRENGTH) TABS Take 1 tablet by mouth daily.  . Multiple Vitamin (MULTIVITAMIN) tablet Take 1 tablet by mouth daily.  . Nutritional Supplements (RESOURCE 2.0) LIQD Take by mouth. 44ml twice daily  . ranitidine (ZANTAC) 150 MG tablet Take 150 mg by mouth at bedtime.   .  sennosides-docusate sodium (SENOKOT-S) 8.6-50 MG tablet Take 2 tablets by mouth daily.  . [DISCONTINUED] enoxaparin (LOVENOX) 30 MG/0.3ML injection Inject 0.3 mLs (30 mg total) into the skin daily.  . [DISCONTINUED] ondansetron (ZOFRAN) 4 MG tablet Take 4 mg by mouth every 6 (six) hours as needed for nausea.   No facility-administered encounter medications on file as of 10/28/2016.     Activities of Daily Living In your present state of health, do you have any difficulty performing the following activities: 10/28/2016  Hearing? Y  Vision? N  Difficulty concentrating or making decisions? Y  Walking or climbing stairs? Y  Dressing or bathing? Y  Doing errands, shopping? Y  Preparing Food and eating ? Y  Using the Toilet? Y  In the past six months, have you accidently leaked urine? Y  Do you have problems with loss of bowel control? Y  Managing your Medications? Y  Managing your Finances? Y  Housekeeping or managing your Housekeeping? Y  Some recent data might be hidden    Patient Care Team: Blanchie Serve, MD as PCP - General (Internal Medicine) Jettie Booze, MD as Consulting Physician (Cardiology) Marchia Bond, MD as Consulting Physician (Orthopedic Surgery) Kathrynn Ducking, MD as Consulting Physician (Neurology) Brand Males, MD as Consulting Physician (Pulmonary Disease) Ngetich, Nelda Bucks, NP as Nurse Practitioner (Family Medicine)    Assessment:     Exercise Activities and Dietary recommendations Current Exercise Habits: The patient does not participate in regular exercise at present, Exercise limited by: neurologic condition(s);orthopedic condition(s)  Goals    None     Fall Risk Fall Risk  10/28/2016 06/20/2015  Falls in the past year? No No  Risk for fall due to : - Impaired balance/gait   Depression Screen PHQ 2/9 Scores 10/28/2016 06/20/2015  PHQ - 2 Score 0 0     Cognitive Function MMSE - Mini Mental State Exam 10/28/2016  Orientation to time 1    Orientation to Place 1  Registration 3  Attention/ Calculation 0  Recall 0  Language- name 2 objects 2  Language- repeat 0  Language- follow 3 step command 2  Language- read & follow direction 1  Write a sentence 0  Copy design 0  Total score 10        Immunization History  Administered Date(s) Administered  . Influenza Whole 01/16/2010  . Influenza-Unspecified 02/03/2006, 01/09/2007, 01/05/2013, 12/22/2014, 12/28/2015  . PPD Test 05/01/2004, 02/24/2014  . Pneumococcal Polysaccharide-23 01/21/2006  . Pneumococcal-Unspecified 12/19/1998  . Td 03/18/1998  . Tdap 09/18/2016   Screening Tests Health Maintenance  Topic Date Due  . INFLUENZA VACCINE  10/16/2016  . PNA vac Low Risk Adult (2 of 2 - PCV13) 03/18/2017 (Originally 01/22/2007)  . TETANUS/TDAP  09/19/2026  . DEXA SCAN  Completed      Plan:    I have personally reviewed and addressed the Medicare Annual Wellness questionnaire and have noted the following in the patient's chart:  A. Medical and social history B. Use of alcohol, tobacco or illicit drugs  C. Current medications and supplements D. Functional ability and status E.  Nutritional status F.  Physical activity G. Advance directives H. List of other physicians I.  Hospitalizations, surgeries, and ER visits in previous 12 months J.  [place] to include hearing, vision, cognitive, depression L. Referrals and appointments - none  In addition, I am unable to review and discuss with incapacitated patient certain preventive protocols, quality metrics, and best practice recommendations. A written personalized care plan for preventive services as well as general preventive health recommendations were provided to patient.   See attached scanned questionnaire for additional information.   Signed,   Rich Reining, RN Nurse Health Advisor   Quick Notes   Health Maintenance: Up to date     Abnormal Screen: MMSE 10/30. Did not pass clock  drawing     Patient Concerns: None     Nurse Concerns: None

## 2016-10-30 ENCOUNTER — Non-Acute Institutional Stay (SKILLED_NURSING_FACILITY): Payer: Medicare Other | Admitting: Family

## 2016-10-30 ENCOUNTER — Encounter: Payer: Self-pay | Admitting: Family

## 2016-10-30 DIAGNOSIS — S81811D Laceration without foreign body, right lower leg, subsequent encounter: Secondary | ICD-10-CM | POA: Diagnosis not present

## 2016-10-30 DIAGNOSIS — R609 Edema, unspecified: Secondary | ICD-10-CM | POA: Diagnosis not present

## 2016-10-30 MED ORDER — FUROSEMIDE 20 MG PO TABS: 40.0000 mg | ORAL_TABLET | Freq: Every day | ORAL | Status: DC

## 2016-10-30 NOTE — Progress Notes (Signed)
Location:  [facility] Room Number: 32 Place of Service:  SNF (31) Provider: Armella Stogner FNP-C  Blanchie Serve, MD  Patient Care Team: Blanchie Serve, MD as PCP - General (Internal Medicine) Jettie Booze, MD as Consulting Physician (Cardiology) Marchia Bond, MD as Consulting Physician (Orthopedic Surgery) Kathrynn Ducking, MD as Consulting Physician (Neurology) Brand Males, MD as Consulting Physician (Pulmonary Disease) Elyce Zollinger, Nelda Bucks, NP as Nurse Practitioner (Family Medicine)  Extended Emergency Contact Information Primary Emergency Contact: [place] of [place] Phone: [phone] Relation: Son Secondary Emergency Contact: Ricardo [place] of [place] Phone: [phone] Mobile Phone: [phone] Relation: Daughter  Code Status:  DNR  Goals of care: Advanced Directive information Advanced Directives 10/28/2016  Does Patient Have a Medical Advance Directive? Yes  Type of Advance Directive Out of facility DNR (pink MOST or yellow form);[place];Living will  Does patient want to make changes to medical advance directive? No - Patient declined  Copy of [place] in Chart? Yes  Pre-existing out of facility DNR order (yellow form or pink MOST form) Yellow form placed in chart (order not valid for inpatient use);Pink MOST form placed in chart (order not valid for inpatient use)     Chief Complaint  Patient presents with  . Acute Visit    Swelling to both legs    HPI:  Pt is a 83 y.o. female seen [facility] at [facility] for an acute visit for evaluation of swelling on the legs. She is seen in her room today. She denies any acute issues this visit. Facility Nurse reports patient has worsening swelling on both legs. She denies any cough, shortness of breath or wheezing. She has had a weight gain of 1.6 pounds over one month.   Past Medical History:    Diagnosis Date  . Alzheimer's disease 06/22/2015  . Anemia    iron deficient  . Anemia   . Anxiety   . Chronic airway obstruction, not elsewhere classified   . Chronic back pain   . Chronic cystitis   . Cystitis, chronic    Dr Gaynelle Arabian  . Degenerative arthritis   . Depression   . Disturbance of skin sensation   . Disturbance of skin sensation   . Dyslipidemia   . Dyspnea    Dr Chase Caller  . Esophageal reflux   . Essential and other specified forms of tremor 12/11/2012  . Foot drop, bilateral 12/21/2013  . Gait disorder   . GERD (gastroesophageal reflux disease)   . Hemorrhoids   . Hereditary and idiopathic peripheral neuropathy 10/11/2015  . Hernia   . History of cerebrovascular disease    Moderate level small vessel disease  . Hyperlipidemia    mild  . Hypertension   . Osteoarthritis    right knee injection per Dr Trudie Reed  . Osteoporosis   . Other abnormal clinical finding   . Pain in joint, upper arm   . Peripheral vascular disease ([place])   . Rheumatoid arthritis ([place])    Dr Trudie Reed  . Tobacco use disorder   . Tremor    and gait disorder--Dr Jannifer Franklin  . Unspecified vitamin D deficiency    Past Surgical History:  Procedure Laterality Date  . APPENDECTOMY    . BACK SURGERY Bilateral    X2  . Bladder resuspension procedure    . CATARACT EXTRACTION Bilateral   . FEMUR IM NAIL Right 02/22/2014   Procedure: INTRAMEDULLARY (IM) NAIL FEMORAL;  Surgeon: Johnny Bridge, MD;  Location: [facility];  Service: Orthopedics;  Laterality: Right;  . INGUINAL HERNIA REPAIR Bilateral   . ORIF ANKLE FRACTURE Left 09/19/2016   Procedure: OPEN REDUCTION INTERNAL FIXATION (ORIF) ANKLE FRACTURE;  Surgeon: Vickey Huger, MD;  Location: [facility];  Service: Orthopedics;  Laterality: Left;  . TONSILLECTOMY      Allergies  Allergen Reactions  . Novocain [Procaine]     Passed  out  . Limbrel [Flavocoxid]     Dizziness   . Sertraline Anxiety    Allergies as of 10/30/2016      Reactions    Novocain [procaine]    Passed  out   Limbrel [flavocoxid]    Dizziness   Sertraline Anxiety      Medication List       Accurate as of 10/30/16  1:49 PM. Always use your most recent med list.          acetaminophen 650 MG CR tablet Commonly known as:  TYLENOL Take 650 mg by mouth 2 (two) times daily.   alum & mag hydroxide-simeth 200-200-20 MG/5ML suspension Commonly known as:  MAALOX/MYLANTA Take 30 mLs by mouth every 4 (four) hours as needed for indigestion or heartburn.   CALTRATE 600+D 600-400 MG-UNIT tablet Generic drug:  Calcium Carbonate-Vitamin D Take 1 tablet by mouth daily.   furosemide 20 MG tablet Commonly known as:  LASIX Take 2 tablets (40 mg total) by mouth daily. Then reduce to 20 mg Tablet daily.Hold for SBP < 110   HYDROcodone-acetaminophen 5-325 MG tablet Commonly known as:  NORCO/VICODIN Take 1 tablet by mouth every 6 (six) hours as needed for moderate pain.   LEVSIN/SL 0.125 MG Subl Generic drug:  Hyoscyamine Sulfate SL Place under the tongue every 12 (twelve) hours as needed (for cramps).   memantine 10 MG tablet Commonly known as:  NAMENDA Take 10 mg by mouth 2 (two) times daily.   multivitamin tablet Take 1 tablet by mouth daily.   OSTEO BI-FLEX TRIPLE STRENGTH Tabs Take 1 tablet by mouth daily.   ranitidine 150 MG tablet Commonly known as:  ZANTAC Take 150 mg by mouth at bedtime.   RESOURCE 2.0 Liqd Take by mouth. 20ml twice daily   sennosides-docusate sodium 8.6-50 MG tablet Commonly known as:  SENOKOT-S Take 2 tablets by mouth daily.   Vitamin D3 10000 units Tabs Take 1 tablet by mouth daily.       Review of Systems  Constitutional: Negative for activity change, appetite change, chills, fatigue and fever.  Respiratory: Negative for cough, chest tightness, shortness of breath and wheezing.   Cardiovascular: Positive for leg swelling. Negative for chest pain and palpitations.  Gastrointestinal: Negative for abdominal  distention, abdominal pain, constipation, diarrhea, nausea and vomiting.  Genitourinary: Negative for flank pain, hematuria and urgency.  Skin: Negative for color change, pallor and rash.  Neurological: Negative for dizziness, syncope, light-headedness and headaches.  Psychiatric/Behavioral: Negative for agitation, confusion, hallucinations and sleep disturbance.    Immunization History  Administered Date(s) Administered  . Influenza Whole 01/16/2010  . Influenza-Unspecified 02/03/2006, 01/09/2007, 01/05/2013, 12/22/2014, 12/28/2015  . PPD Test 05/01/2004, 02/24/2014  . Pneumococcal Polysaccharide-23 01/21/2006  . Pneumococcal-Unspecified 12/19/1998  . Td 03/18/1998  . Tdap 09/18/2016   Pertinent  Health Maintenance Due  Topic Date Due  . INFLUENZA VACCINE  10/16/2016  . PNA vac Low Risk Adult (2 of 2 - PCV13) 03/18/2017 (Originally 01/22/2007)  . DEXA SCAN  Completed   Fall Risk  10/28/2016 06/20/2015  Falls in the past year? No No  Risk for fall due to : - Impaired balance/gait    Vitals:   10/30/16 1317  BP: 138/78  Pulse: 88  Resp: 18  Temp: 97.7 F (36.5 C)  TempSrc: Oral  SpO2: 96%  Weight: 124 lb (56.2 kg)  Height: 5\' 2"  (1.575 m)   Body mass index is 22.68 kg/m. Physical Exam  Constitutional: She appears well-developed and well-nourished. No distress.  HENT:  Head: Normocephalic.  Mouth/Throat: Oropharynx is clear and moist. No oropharyngeal exudate.  Eyes: Pupils are equal, round, and reactive to light. Conjunctivae and EOM are normal. Right eye exhibits no discharge. Left eye exhibits no discharge. No scleral icterus.  Neck: Normal range of motion. No JVD present. No thyromegaly present.  Cardiovascular: Intact distal pulses.  Exam reveals no gallop and no friction rub.   Murmur heard. Pulmonary/Chest: Effort normal and breath sounds normal. No respiratory distress. She has no wheezes. She has no rales.  Abdominal: Soft. Bowel sounds are normal. There is no  rebound and no guarding.     Musculoskeletal: She exhibits no tenderness or deformity.  Spends most time on wheelchair.Gait unsteady. Right leg boot in place.Bilateral 2-3+ edema to lower extremities noted.    Lymphadenopathy:    She has no cervical adenopathy.  Neurological: She is alert. Coordination normal.  Skin: Skin is warm and dry. No rash noted. No erythema. No pallor.  Right lateral leg skin tears x 2 with steri-strips surrounding skin tissues without any signs of infections. Shin area serous weeping due to edema noted.    Psychiatric: She has a normal mood and affect.    Labs reviewed:  Recent Labs  09/18/16 2232 09/18/16 2239 09/26/16  NA 137 136 139  K 4.6 4.5 4.5  CL 99* 96*  --   CO2 33*  --   --   GLUCOSE 115* 110*  --   BUN 24* 30* 16  CREATININE 1.06* 1.00 0.9  CALCIUM 9.4  --   --     Recent Labs  11/17/15 05/16/16  AST 17 16  ALT 11 7  ALKPHOS 71 70    Recent Labs  05/16/16 09/18/16 2232 09/18/16 2239 09/26/16  WBC 6.9 11.9*  --  10.5  NEUTROABS  --  8.4*  --   --   HGB 14.3 13.0 13.3 13.2  HCT 44 39.5 39.0 40  MCV  --  99.7  --   --   PLT 260 358  --  447*   Lab Results  Component Value Date   TSH 1.49 05/16/2016   Significant Diagnostic Results in last 30 days:  No results found.  Assessment/Plan 1. Edema, unspecified type Bilateral lower extremities 2-3+ edema. Right Shin area serous weeping noted. Lungs CTA no cough or shortness of breath noted.left left brace boot in place. Apply right leg knee high Ted hose. Change furosemide to 40 mg tablet daily x 5 days then reduce to 20 mg Tablet daily. Check BMP 11/04/2016.   2. Skin tear   Right lateral leg skin tears x 2 with steri-strips surrounding skin tissues without any signs of infections.continue to monitor.   Family/ staff Communication: Reviewed plan of care with patient and facility Nurse.   Labs/tests ordered: BMP 11/04/2016.  Sandrea Hughs, NP

## 2016-11-04 DIAGNOSIS — I10 Essential (primary) hypertension: Secondary | ICD-10-CM | POA: Diagnosis not present

## 2016-11-04 LAB — BASIC METABOLIC PANEL
BUN: 14 (ref 4–21)
Creatinine: 0.9 (ref 0.5–1.1)
GLUCOSE: 85
Potassium: 3.6 (ref 3.4–5.3)
SODIUM: 142 (ref 137–147)

## 2016-11-05 ENCOUNTER — Encounter: Payer: Self-pay | Admitting: *Deleted

## 2016-11-06 ENCOUNTER — Non-Acute Institutional Stay (SKILLED_NURSING_FACILITY): Payer: Medicare Other | Admitting: Family

## 2016-11-06 ENCOUNTER — Encounter: Payer: Self-pay | Admitting: Family

## 2016-11-06 DIAGNOSIS — K5901 Slow transit constipation: Secondary | ICD-10-CM | POA: Diagnosis not present

## 2016-11-06 DIAGNOSIS — K219 Gastro-esophageal reflux disease without esophagitis: Secondary | ICD-10-CM | POA: Diagnosis not present

## 2016-11-06 DIAGNOSIS — R609 Edema, unspecified: Secondary | ICD-10-CM | POA: Diagnosis not present

## 2016-11-06 DIAGNOSIS — R000 Tachycardia, unspecified: Secondary | ICD-10-CM | POA: Diagnosis not present

## 2016-11-06 NOTE — Progress Notes (Signed)
Location:  [facility] Room Number: 32 Place of Service:  SNF (31) Provider: Anja Neuzil FNP-C   Blanchie Serve, MD  Patient Care Team: Blanchie Serve, MD as PCP - General (Internal Medicine) Jettie Booze, MD as Consulting Physician (Cardiology) Marchia Bond, MD as Consulting Physician (Orthopedic Surgery) Kathrynn Ducking, MD as Consulting Physician (Neurology) Brand Males, MD as Consulting Physician (Pulmonary Disease) Chavonne Sforza, Nelda Bucks, NP as Nurse Practitioner (Family Medicine)  Extended Emergency Contact Information Primary Emergency Contact: Isabela of [place] Phone: [phone] Relation: Son Secondary Emergency Contact: Ricardo Jericho States of [place] Phone: [phone] Mobile Phone: [phone] Relation: Daughter  Code Status: DNR  Goals of care: Advanced Directive information Advanced Directives 11/06/2016  Does Patient Have a Medical Advance Directive? Yes  Type of Advance Directive Out of facility DNR (pink MOST or yellow form);[place];Living will  Does patient want to make changes to medical advance directive? -  Copy of [place] in Chart? Yes  Pre-existing out of facility DNR order (yellow form or pink MOST form) Yellow form placed in chart (order not valid for inpatient use)     Chief Complaint  Patient presents with  . Medical Management of Chronic Issues    routine visit    HPI:  Pt is a 83 y.o. female seen today [place] for medical management of chronic diseases.she has a medical history of HTN, GERD, Anemia, Depression, Anxiety, Alzheimer's disease,OA among other conditions. She is seen in her room today. She continues to work with therapy post left ankle fracture. She complains of chronic nausea just received nausea medication per facility Nurse. Her facility vital signs reviewed HR ranging in the 100's-120's. She denies  any chest pain or shortness of breath.     Past Medical History:  Diagnosis Date  . Alzheimer's disease 06/22/2015  . Anemia    iron deficient  . Anemia   . Anxiety   . Chronic airway obstruction, not elsewhere classified   . Chronic back pain   . Chronic cystitis   . Cystitis, chronic    Dr Gaynelle Arabian  . Degenerative arthritis   . Depression   . Disturbance of skin sensation   . Disturbance of skin sensation   . Dyslipidemia   . Dyspnea    Dr Chase Caller  . Esophageal reflux   . Essential and other specified forms of tremor 12/11/2012  . Foot drop, bilateral 12/21/2013  . Gait disorder   . GERD (gastroesophageal reflux disease)   . Hemorrhoids   . Hereditary and idiopathic peripheral neuropathy 10/11/2015  . Hernia   . History of cerebrovascular disease    Moderate level small vessel disease  . Hyperlipidemia    mild  . Hypertension   . Osteoarthritis    right knee injection per Dr Trudie Reed  . Osteoporosis   . Other abnormal clinical finding   . Pain in joint, upper arm   . Peripheral vascular disease ([place])   . Rheumatoid arthritis ([place])    Dr Trudie Reed  . Tobacco use disorder   . Tremor    and gait disorder--Dr Jannifer Franklin  . Unspecified vitamin D deficiency    Past Surgical History:  Procedure Laterality Date  . APPENDECTOMY    . BACK SURGERY Bilateral    X2  . Bladder resuspension procedure    . CATARACT EXTRACTION Bilateral   . FEMUR IM NAIL Right 02/22/2014   Procedure: INTRAMEDULLARY (IM) NAIL FEMORAL;  Surgeon: Johnny Bridge, MD;  Location: [facility];  Service: Orthopedics;  Laterality: Right;  . INGUINAL HERNIA REPAIR Bilateral   . ORIF ANKLE FRACTURE Left 09/19/2016   Procedure: OPEN REDUCTION INTERNAL FIXATION (ORIF) ANKLE FRACTURE;  Surgeon: Vickey Huger, MD;  Location: [facility];  Service: Orthopedics;  Laterality: Left;  . TONSILLECTOMY      Allergies  Allergen Reactions  . Novocain [Procaine]     Passed  out  . Limbrel [Flavocoxid]     Dizziness   .  Sertraline Anxiety    Allergies as of 11/06/2016      Reactions   Novocain [procaine]    Passed  out   Limbrel [flavocoxid]    Dizziness   Sertraline Anxiety      Medication List       Accurate as of 11/06/16  2:37 PM. Always use your most recent med list.          acetaminophen 650 MG CR tablet Commonly known as:  TYLENOL Take 650 mg by mouth 2 (two) times daily.   alum & mag hydroxide-simeth 200-200-20 MG/5ML suspension Commonly known as:  MAALOX/MYLANTA Take 30 mLs by mouth every 4 (four) hours as needed for indigestion or heartburn.   CALTRATE 600+D 600-400 MG-UNIT tablet Generic drug:  Calcium Carbonate-Vitamin D Take 1 tablet by mouth daily.   furosemide 20 MG tablet Commonly known as:  LASIX Take 20 mg by mouth daily.   HYDROcodone-acetaminophen 5-325 MG tablet Commonly known as:  NORCO/VICODIN Take 1 tablet by mouth every 6 (six) hours as needed for moderate pain.   LEVSIN/SL 0.125 MG Subl Generic drug:  Hyoscyamine Sulfate SL Place under the tongue every 12 (twelve) hours as needed (for cramps).   memantine 10 MG tablet Commonly known as:  NAMENDA Take 10 mg by mouth 2 (two) times daily.   multivitamin tablet Take 1 tablet by mouth daily.   OSTEO BI-FLEX TRIPLE STRENGTH Tabs Take 1 tablet by mouth daily.   ranitidine 150 MG tablet Commonly known as:  ZANTAC Take 150 mg by mouth at bedtime.   RESOURCE 2.0 Liqd Take by mouth. 33ml twice daily   sennosides-docusate sodium 8.6-50 MG tablet Commonly known as:  SENOKOT-S Take 2 tablets by mouth daily.   Vitamin D3 10000 units Tabs Take 1 tablet by mouth daily.       Review of Systems  Constitutional: Negative for activity change, appetite change, chills, fatigue and fever.  HENT: Negative for congestion, rhinorrhea, sinus pain, sinus pressure, sneezing, sore throat and trouble swallowing.   Eyes: Negative for pain, discharge, redness and itching.  Respiratory: Negative for cough, chest  tightness, shortness of breath and wheezing.   Cardiovascular: Positive for leg swelling. Negative for chest pain and palpitations.  Gastrointestinal: Negative for abdominal distention, abdominal pain, constipation, diarrhea, nausea and vomiting.  Endocrine: Negative for cold intolerance, heat intolerance, polydipsia, polyphagia and polyuria.  Genitourinary: Negative for dysuria, flank pain, hematuria and urgency.  Musculoskeletal: Positive for arthralgias and gait problem.  Skin: Negative for color change, pallor and rash.  Neurological: Negative for dizziness, syncope, light-headedness and headaches.  Hematological: Does not bruise/bleed easily.  Psychiatric/Behavioral: Negative for agitation, confusion, hallucinations and sleep disturbance.    Immunization History  Administered Date(s) Administered  . Influenza Whole 01/16/2010  . Influenza-Unspecified 02/03/2006, 01/09/2007, 01/05/2013, 12/22/2014, 12/28/2015  . PPD Test 05/01/2004, 02/24/2014  . Pneumococcal Polysaccharide-23 01/21/2006  . Pneumococcal-Unspecified 12/19/1998  . Td 03/18/1998  . Tdap 09/18/2016   Pertinent  Health Maintenance  Due  Topic Date Due  . INFLUENZA VACCINE  10/16/2016  . PNA vac Low Risk Adult (2 of 2 - PCV13) 03/18/2017 (Originally 01/22/2007)  . DEXA SCAN  Completed   Fall Risk  10/28/2016 06/20/2015  Falls in the past year? No No  Risk for fall due to : - Impaired balance/gait    Vitals:   11/06/16 1217  BP: 110/82  Pulse: (!) 116  Resp: 16  Temp: (!) 94.1 F (34.5 C)  SpO2: 94%  Weight: 124 lb (56.2 kg)  Height: 5\' 2"  (1.575 m)   Body mass index is 22.68 kg/m. Physical Exam  Constitutional: She appears well-developed and well-nourished. No distress.  HENT:  Head: Normocephalic.  Mouth/Throat: Oropharynx is clear and moist. No oropharyngeal exudate.  Eyes: Pupils are equal, round, and reactive to light. Conjunctivae and EOM are normal. Right eye exhibits no discharge. Left eye exhibits  no discharge. No scleral icterus.  Neck: Normal range of motion. No JVD present. No thyromegaly present.  Cardiovascular: Intact distal pulses.  Exam reveals no gallop and no friction rub.   Murmur heard. HR 116   Pulmonary/Chest: Effort normal and breath sounds normal. No respiratory distress. She has no wheezes. She has no rales.  Abdominal: Soft. Bowel sounds are normal. There is no rebound and no guarding.   LBM 11/04/2016  Musculoskeletal: She exhibits no tenderness or deformity.  Moves x 4 extremities except left leg boot in place. Bilateral lower extremities edema 1-2 +   Lymphadenopathy:    She has no cervical adenopathy.  Neurological: She is alert. Coordination normal.  Skin: Skin is warm and dry. No rash noted. No erythema. No pallor.  Right leg skin tears progressive healing no drainage noted.   Psychiatric: She has a normal mood and affect.   Labs reviewed:  Recent Labs  09/18/16 2232 09/18/16 2239 09/26/16 11/04/16  NA 137 136 139 142  K 4.6 4.5 4.5 3.6  CL 99* 96*  --   --   CO2 33*  --   --   --   GLUCOSE 115* 110*  --   --   BUN 24* 30* 16 14  CREATININE 1.06* 1.00 0.9 0.9  CALCIUM 9.4  --   --   --     Recent Labs  11/17/15 05/16/16  AST 17 16  ALT 11 7  ALKPHOS 71 70    Recent Labs  05/16/16 09/18/16 2232 09/18/16 2239 09/26/16  WBC 6.9 11.9*  --  10.5  NEUTROABS  --  8.4*  --   --   HGB 14.3 13.0 13.3 13.2  HCT 44 39.5 39.0 40  MCV  --  99.7  --   --   PLT 260 358  --  447*   Lab Results  Component Value Date   TSH 1.49 05/16/2016   Significant Diagnostic Results in last 30 days:  No results found.  Assessment/Plan  1. Tachycardia HR ranging in the 100's-120's. Tends to be high in the afternoon and within range in the morning.Asymptomatic.obtain EKG. Check TSH level 11/07/2016.   2. Slow transit constipation Continue on senokot-S two tablets daily.    3. Gastroesophageal reflux disease without esophagitis Stable. Continue on  ranitidine 150 mg tablet daily.   4. Edema Much improvement. Continue on Furosemide 40 mg tablet daily.   Family/ staff Communication: Reviewed plan of care with patient and facility Nurse supervisor   Labs/tests ordered: EKG. Check TSH level 11/07/2016.  Sandrea Hughs, NP

## 2016-11-07 DIAGNOSIS — I10 Essential (primary) hypertension: Secondary | ICD-10-CM | POA: Diagnosis not present

## 2016-11-07 LAB — TSH: TSH: 1.67 (ref 0.41–5.90)

## 2016-11-15 ENCOUNTER — Non-Acute Institutional Stay (SKILLED_NURSING_FACILITY): Payer: Medicare Other | Admitting: Family

## 2016-11-15 ENCOUNTER — Encounter: Payer: Self-pay | Admitting: Family

## 2016-11-15 DIAGNOSIS — L89620 Pressure ulcer of left heel, unstageable: Secondary | ICD-10-CM | POA: Diagnosis not present

## 2016-11-15 NOTE — Progress Notes (Addendum)
Location:  [facility] Room Number: 32 Place of Service:  SNF (31) Provider: Emogene Muratalla FNP-C  Blanchie Serve, MD  Patient Care Team: Blanchie Serve, MD as PCP - General (Internal Medicine) Jettie Booze, MD as Consulting Physician (Cardiology) Marchia Bond, MD as Consulting Physician (Orthopedic Surgery) Kathrynn Ducking, MD as Consulting Physician (Neurology) Brand Males, MD as Consulting Physician (Pulmonary Disease) Zion Ta, Nelda Bucks, NP as Nurse Practitioner (Family Medicine)  Extended Emergency Contact Information Primary Emergency Contact: [place] of [place] Phone: [phone] Relation: Son Secondary Emergency Contact: Ricardo Jericho States of [place] Phone: [phone] Mobile Phone: [phone] Relation: Daughter  Code Status:  DNR Goals of care: Advanced Directive information Advanced Directives 11/15/2016  Does Patient Have a Medical Advance Directive? Yes  Type of Advance Directive Out of facility DNR (pink MOST or yellow form);[place];Living will  Does patient want to make changes to medical advance directive? -  Copy of [place] in Chart? Yes  Pre-existing out of facility DNR order (yellow form or pink MOST form) Yellow form placed in chart (order not valid for inpatient use)     Chief Complaint  Patient presents with  . Acute Visit    wound on left heel    HPI:  Pt is a 83 y.o. female seen today at [facility] for an acute visit for evaluation of left heel wound. She is seen in her room today per facility Nurse request. Nurse reports patient has new onset of left heel ulcer. She wears a cam boot throughout the day status post left ankle open bimalleolar fracture post ORIF on 09/19/2016. She denies any pain, fever, chills or drainage from heel.   Addendum: 11/19/2016 Facility Nurse reports patient's left heel eschar came off. No  bleeding or signs of infections noted. Afebrile.    Past Medical History:  Diagnosis Date  . Alzheimer's disease 06/22/2015  . Anemia    iron deficient  . Anemia   . Anxiety   . Chronic airway obstruction, not elsewhere classified   . Chronic back pain   . Chronic cystitis   . Cystitis, chronic    Dr Gaynelle Arabian  . Degenerative arthritis   . Depression   . Disturbance of skin sensation   . Disturbance of skin sensation   . Dyslipidemia   . Dyspnea    Dr Chase Caller  . Esophageal reflux   . Essential and other specified forms of tremor 12/11/2012  . Foot drop, bilateral 12/21/2013  . Gait disorder   . GERD (gastroesophageal reflux disease)   . Hemorrhoids   . Hereditary and idiopathic peripheral neuropathy 10/11/2015  . Hernia   . History of cerebrovascular disease    Moderate level small vessel disease  . Hyperlipidemia    mild  . Hypertension   . Osteoarthritis    right knee injection per Dr Trudie Reed  . Osteoporosis   . Other abnormal clinical finding   . Pain in joint, upper arm   . Peripheral vascular disease ([place])   . Rheumatoid arthritis ([place])    Dr Trudie Reed  . Tobacco use disorder   . Tremor    and gait disorder--Dr Jannifer Franklin  . Unspecified vitamin D deficiency    Past Surgical History:  Procedure Laterality Date  . APPENDECTOMY    . BACK SURGERY Bilateral    X2  . Bladder resuspension procedure    . CATARACT EXTRACTION Bilateral   . FEMUR IM  NAIL Right 02/22/2014   Procedure: INTRAMEDULLARY (IM) NAIL FEMORAL;  Surgeon: Johnny Bridge, MD;  Location: [facility];  Service: Orthopedics;  Laterality: Right;  . INGUINAL HERNIA REPAIR Bilateral   . ORIF ANKLE FRACTURE Left 09/19/2016   Procedure: OPEN REDUCTION INTERNAL FIXATION (ORIF) ANKLE FRACTURE;  Surgeon: Vickey Huger, MD;  Location: [facility];  Service: Orthopedics;  Laterality: Left;  . TONSILLECTOMY      Allergies  Allergen Reactions  . Novocain [Procaine]     Passed  out  . Limbrel [Flavocoxid]     Dizziness   .  Sertraline Anxiety    Outpatient Encounter Prescriptions as of 11/15/2016  Medication Sig  . acetaminophen (TYLENOL) 650 MG CR tablet Take 650 mg by mouth 2 (two) times daily.   Marland Kitchen alum & mag hydroxide-simeth (MAALOX/MYLANTA) 200-200-20 MG/5ML suspension Take 30 mLs by mouth every 4 (four) hours as needed for indigestion or heartburn.  . Calcium Carbonate-Vitamin D (CALTRATE 600+D) 600-400 MG-UNIT per tablet Take 1 tablet by mouth daily.  . Cholecalciferol (VITAMIN D3) 1000 units CAPS Take 1 capsule by mouth daily.  . furosemide (LASIX) 20 MG tablet Take 20 mg by mouth daily.  Marland Kitchen HYDROcodone-acetaminophen (NORCO/VICODIN) 5-325 MG tablet Take 1 tablet by mouth every 6 (six) hours as needed for moderate pain.  Marland Kitchen Hyoscyamine Sulfate SL (LEVSIN/SL) 0.125 MG SUBL Place under the tongue every 12 (twelve) hours as needed (for cramps).   . memantine (NAMENDA) 10 MG tablet Take 10 mg by mouth 2 (two) times daily.  . metoprolol tartrate (LOPRESSOR) 25 MG tablet Take 12.5 mg by mouth 2 (two) times daily. Hold for SBP <110 or HR <60/min  . Misc Natural Products (OSTEO BI-FLEX TRIPLE STRENGTH) TABS Take 1 tablet by mouth daily.  . Multiple Vitamin (MULTIVITAMIN) tablet Take 1 tablet by mouth daily.  . Nutritional Supplements (RESOURCE 2.0) LIQD Take by mouth. 48ml twice daily  . ranitidine (ZANTAC) 150 MG tablet Take 150 mg by mouth at bedtime.   . sennosides-docusate sodium (SENOKOT-S) 8.6-50 MG tablet Take 2 tablets by mouth daily.  . [DISCONTINUED] Cholecalciferol (VITAMIN D3) 10000 units TABS Take 1 tablet by mouth daily.   No facility-administered encounter medications on file as of 11/15/2016.     Review of Systems  Constitutional: Negative for activity change, chills and fever.  Respiratory: Negative for cough, chest tightness, shortness of breath and wheezing.   Cardiovascular: Negative for chest pain, palpitations and leg swelling.  Gastrointestinal: Negative for abdominal distention, abdominal  pain, constipation, diarrhea and nausea.  Musculoskeletal: Positive for gait problem.       Wears cam boot through out the day status post left ORIF on 09/19/2016.  Skin: Negative for color change, pallor and rash.       New onset of left heel ulcer   Neurological: Negative for dizziness, seizures, light-headedness and headaches.  Psychiatric/Behavioral: Negative for agitation, confusion and hallucinations. The patient is not nervous/anxious.     Immunization History  Administered Date(s) Administered  . Influenza Whole 01/16/2010  . Influenza-Unspecified 02/03/2006, 01/09/2007, 01/05/2013, 12/22/2014, 12/28/2015  . PPD Test 05/01/2004, 02/24/2014  . Pneumococcal Polysaccharide-23 01/21/2006  . Pneumococcal-Unspecified 12/19/1998  . Td 03/18/1998  . Tdap 09/18/2016   Pertinent  Health Maintenance Due  Topic Date Due  . INFLUENZA VACCINE  10/16/2016  . PNA vac Low Risk Adult (2 of 2 - PCV13) 03/18/2017 (Originally 01/22/2007)  . DEXA SCAN  Completed   Fall Risk  10/28/2016 06/20/2015  Falls in the past year? No  No  Risk for fall due to : - Impaired balance/gait    Vitals:   11/15/16 1127  BP: 120/80  Pulse: 90  Resp: 16  Temp: 97.8 F (36.6 C)  SpO2: 96%  Weight: 124 lb (56.2 kg)  Height: 5\' 2"  (1.575 m)   Body mass index is 22.68 kg/m. Physical Exam  Constitutional: She appears well-developed and well-nourished. No distress.  Elderly in no acute distress  HENT:  Head: Normocephalic.  Mouth/Throat: Oropharynx is clear and moist. No oropharyngeal exudate.  Eyes: Pupils are equal, round, and reactive to light. Conjunctivae and EOM are normal. Right eye exhibits no discharge. Left eye exhibits no discharge. No scleral icterus.  Neck: Normal range of motion. No JVD present. No thyromegaly present.  Cardiovascular: Normal rate, regular rhythm, normal heart sounds and intact distal pulses.  Exam reveals no gallop and no friction rub.   No murmur heard. Pulmonary/Chest: Effort  normal and breath sounds normal. No respiratory distress. She has no wheezes. She has no rales.  Abdominal: Soft. Bowel sounds are normal. She exhibits no distension. There is no tenderness. There is no rebound and no guarding.  Musculoskeletal: She exhibits no edema or tenderness.  Moves x 4 extremities.Unsteady gait self propels on wheelchair  Lymphadenopathy:    She has no cervical adenopathy.  Skin: Skin is warm and dry. No rash noted. No erythema. No pallor.  Left heel unstageable ulcer wound bed dry covered with  Black eschar.No drainage noted. Surrounding skin without any signs of infections.   Psychiatric: She has a normal mood and affect.   Labs reviewed:  Recent Labs  09/18/16 2232 09/18/16 2239 09/26/16 11/04/16  NA 137 136 139 142  K 4.6 4.5 4.5 3.6  CL 99* 96*  --   --   CO2 33*  --   --   --   GLUCOSE 115* 110*  --   --   BUN 24* 30* 16 14  CREATININE 1.06* 1.00 0.9 0.9  CALCIUM 9.4  --   --   --     Recent Labs  11/17/15 05/16/16  AST 17 16  ALT 11 7  ALKPHOS 71 70    Recent Labs  05/16/16 09/18/16 2232 09/18/16 2239 09/26/16  WBC 6.9 11.9*  --  10.5  NEUTROABS  --  8.4*  --   --   HGB 14.3 13.0 13.3 13.2  HCT 44 39.5 39.0 40  MCV  --  99.7  --   --   PLT 260 358  --  447*   Lab Results  Component Value Date   TSH 1.49 05/16/2016   Significant Diagnostic Results in last 30 days:  No results found.  Assessment/Plan   Decubitus ulcer of left heel, unstageable Afebrile. New onset of heel eschar suspect due to wearing CAM boot during the day causing pressure to heel. Status post  left ankle open bimalleolar fracture post ORIF on 09/19/2016.Facility Nurse to cleanse heel eschar with saline,pat dry and cover with foam dressing to provide extra cushion. Change dressing Q 3 days until resolved. Nurse to check with Orthopedic office if use of off loading boot is okay at bedtime to relief pressure on heel and prevent further skin breakdown.   Addendum:  11/19/2016 Left heel previous eschar site wound bed red without any drainage noted. Surrounding skin tissues without any signs of infections. Will discontinue current dressing. Facility Nurse to cleanse with saline, pat dry, apply hydrogel and cover with allyn. Change dressing every  3 days.   Family/ staff Communication: Reviewed plan of care with patient and facility Nurse.   Labs/tests ordered: None   Edmon Magid C Weylyn Ricciuti, NP

## 2016-11-22 DIAGNOSIS — S82852D Displaced trimalleolar fracture of left lower leg, subsequent encounter for closed fracture with routine healing: Secondary | ICD-10-CM | POA: Diagnosis not present

## 2016-11-22 DIAGNOSIS — M7752 Other enthesopathy of left foot: Secondary | ICD-10-CM | POA: Diagnosis not present

## 2016-11-22 DIAGNOSIS — Z967 Presence of other bone and tendon implants: Secondary | ICD-10-CM | POA: Diagnosis not present

## 2016-11-26 DIAGNOSIS — L570 Actinic keratosis: Secondary | ICD-10-CM | POA: Diagnosis not present

## 2016-11-26 DIAGNOSIS — C44622 Squamous cell carcinoma of skin of right upper limb, including shoulder: Secondary | ICD-10-CM | POA: Diagnosis not present

## 2016-12-09 ENCOUNTER — Encounter: Payer: Self-pay | Admitting: Internal Medicine

## 2016-12-09 ENCOUNTER — Non-Acute Institutional Stay (SKILLED_NURSING_FACILITY): Payer: Medicare Other | Admitting: Internal Medicine

## 2016-12-09 DIAGNOSIS — N183 Chronic kidney disease, stage 3 unspecified: Secondary | ICD-10-CM | POA: Insufficient documentation

## 2016-12-09 DIAGNOSIS — K5901 Slow transit constipation: Secondary | ICD-10-CM

## 2016-12-09 DIAGNOSIS — K219 Gastro-esophageal reflux disease without esophagitis: Secondary | ICD-10-CM

## 2016-12-09 DIAGNOSIS — I10 Essential (primary) hypertension: Secondary | ICD-10-CM | POA: Diagnosis not present

## 2016-12-09 DIAGNOSIS — R1013 Epigastric pain: Secondary | ICD-10-CM | POA: Diagnosis not present

## 2016-12-09 DIAGNOSIS — Z8781 Personal history of (healed) traumatic fracture: Secondary | ICD-10-CM

## 2016-12-09 DIAGNOSIS — I129 Hypertensive chronic kidney disease with stage 1 through stage 4 chronic kidney disease, or unspecified chronic kidney disease: Secondary | ICD-10-CM | POA: Insufficient documentation

## 2016-12-09 NOTE — Progress Notes (Signed)
Provider:  Blanchie Serve MD  Location:  [facility] Room Number: 32 Place of Service:  SNF (31)  PCP: Blanchie Serve, MD Patient Care Team: Blanchie Serve, MD as PCP - General (Internal Medicine) Jettie Booze, MD as Consulting Physician (Cardiology) Marchia Bond, MD as Consulting Physician (Orthopedic Surgery) Kathrynn Ducking, MD as Consulting Physician (Neurology) Brand Males, MD as Consulting Physician (Pulmonary Disease) Ngetich, Nelda Bucks, NP as Nurse Practitioner (Family Medicine)  Extended Emergency Contact Information Primary Emergency Contact: [place] Phone: [phone] Relation: Son Secondary Emergency Contact: Ricardo [place] of [place] Phone: [phone] Mobile Phone: [phone] Relation: Daughter  Code Status: DNR Goals of Care: Advanced Directive information Advanced Directives 12/09/2016  Does Patient Have a Medical Advance Directive? Yes  Type of Paramedic of [place];Living will;Out of facility DNR (pink MOST or yellow form)  Does patient want to make changes to medical advance directive? No - Patient declined  Copy of [place] in Chart? Yes  Pre-existing out of facility DNR order (yellow form or pink MOST form) Yellow form placed in chart (order not valid for inpatient use)      Chief Complaint  Patient presents with  . Medical Management of Chronic Issues    Routine Visit     HPI: Patient is a 83 y.o. female seen today for routine visit. She complaints of intermittent upset stomach. When asked further, she points to epigastric area. Appetite is fair overall though refuses feeding at times due to discomfort to her abdomen. She has dementia and is pleasantly confused.   Hypertension- stable BP overall with few readings 109/67, 128/56, 103/68, 107/63. Currently on metoprolol tartrate 12.5 mg bid  Chronic  constipation- currently taking milk of magnesia, senokot s.   gerd- taking ranitidine, complaints of epigastric discomfort. Denies vomiting but has occassional nausea. Denies diarrhea. She is on hyoscyamine prn for ? Colic pain.    History of left ankle fracture- s/p ORIF, gets around with her wheelchair. No fall reported. Currently on norco 5-325 mg q6h prn pain with tylenol 650 mg bid scheduled. Has used her prn norco twice on 9/12 and 9/14. None after that.   Past Medical History:  Diagnosis Date  . Alzheimer's disease 06/22/2015  . Anemia    iron deficient  . Anemia   . Anxiety   . Chronic airway obstruction, not elsewhere classified   . Chronic back pain   . Chronic cystitis   . Cystitis, chronic    Dr Gaynelle Arabian  . Degenerative arthritis   . Depression   . Disturbance of skin sensation   . Disturbance of skin sensation   . Dyslipidemia   . Dyspnea    Dr Chase Caller  . Esophageal reflux   . Essential and other specified forms of tremor 12/11/2012  . Foot drop, bilateral 12/21/2013  . Gait disorder   . GERD (gastroesophageal reflux disease)   . Hemorrhoids   . Hereditary and idiopathic peripheral neuropathy 10/11/2015  . Hernia   . History of cerebrovascular disease    Moderate level small vessel disease  . Hyperlipidemia    mild  . Hypertension   . Osteoarthritis    right knee injection per Dr Trudie Reed  . Osteoporosis   . Other abnormal clinical finding   . Pain in joint, upper arm   . Peripheral vascular disease ([place])   . Rheumatoid arthritis ([place])    Dr Trudie Reed  .  Tobacco use disorder   . Tremor    and gait disorder--Dr Jannifer Franklin  . Unspecified vitamin D deficiency    Past Surgical History:  Procedure Laterality Date  . APPENDECTOMY    . BACK SURGERY Bilateral    X2  . Bladder resuspension procedure    . CATARACT EXTRACTION Bilateral   . FEMUR IM NAIL Right 02/22/2014   Procedure: INTRAMEDULLARY (IM) NAIL FEMORAL;  Surgeon: Johnny Bridge, MD;  Location: [facility];   Service: Orthopedics;  Laterality: Right;  . INGUINAL HERNIA REPAIR Bilateral   . ORIF ANKLE FRACTURE Left 09/19/2016   Procedure: OPEN REDUCTION INTERNAL FIXATION (ORIF) ANKLE FRACTURE;  Surgeon: Vickey Huger, MD;  Location: [facility];  Service: Orthopedics;  Laterality: Left;  . TONSILLECTOMY      reports that she has quit smoking. She has never used smokeless tobacco. She reports that she does not drink alcohol or use drugs. Social History   Social History  . Marital status: Widowed    Spouse name: N/A  . Number of children: 3  . Years of education: college   Occupational History  . SECRETARY Retired  . Retired     Social History Main Topics  . Smoking status: Former Research scientist (life sciences)  . Smokeless tobacco: Never Used     Comment: "very few years"  . Alcohol use No  . Drug use: No  . Sexual activity: No   Other Topics Concern  . Not on file   Social History Narrative   Patient [facility]    Patient is widowed.    Patient has 3 children.    Patient is right handed.    Patient has 2 years of Business school.       Tobacco use, amount per day now: None      Past tobacco use, amount per day: Never      How many years did you use tobacco: Never      Alcohol use (drinks per week): None      Diet: N/A      Do you drink/eat things with caffeine? No      Marital status: Widowed             What year were you married?      Do you live in a house, apartment, assisted living, condo, trailer? [place]      Is it one or more stories? 1      How many persons live in your home? N/A      Do you have any pets in your home? 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 N/A             How often? N/A      Do you have a living will? Yes      Do you have a DNR form? N/A           If not, do you want to discuss one? N/A      Do you have signed POA/HPOA forms? Yes               Functional Status Survey:    Family History  Problem  Relation Age of Onset  . Heart Problems Mother   . Diabetes Father   . Heart attack Father   . Cancer Sister        Brain tumor  . Emphysema Brother  Health Maintenance  Topic Date Due  . INFLUENZA VACCINE  12/16/2016 (Originally 10/16/2016)  . PNA vac Low Risk Adult (2 of 2 - PCV13) 03/18/2017 (Originally 01/22/2007)  . TETANUS/TDAP  09/19/2026  . DEXA SCAN  Completed    Allergies  Allergen Reactions  . Novocain [Procaine]     Passed  out  . Limbrel [Flavocoxid]     Dizziness   . Sertraline Anxiety    Outpatient Encounter Prescriptions as of 12/09/2016  Medication Sig  . acetaminophen (TYLENOL) 650 MG CR tablet Take 650 mg by mouth 2 (two) times daily.   Marland Kitchen alum & mag hydroxide-simeth (MAALOX/MYLANTA) 200-200-20 MG/5ML suspension Take 30 mLs by mouth every 4 (four) hours as needed for indigestion or heartburn.  . Calcium Carbonate-Vitamin D (CALTRATE 600+D) 600-400 MG-UNIT per tablet Take 1 tablet by mouth daily.  . Cholecalciferol (VITAMIN D3) 1000 units CAPS Take 1 capsule by mouth daily.  . furosemide (LASIX) 20 MG tablet Take 20 mg by mouth daily.  Marland Kitchen HYDROcodone-acetaminophen (NORCO/VICODIN) 5-325 MG tablet Take 1 tablet by mouth every 6 (six) hours as needed for moderate pain.  Marland Kitchen Hyoscyamine Sulfate SL (LEVSIN/SL) 0.125 MG SUBL Place under the tongue every 12 (twelve) hours as needed (for cramps).   . magnesium hydroxide (MILK OF MAGNESIA) 400 MG/5ML suspension Take 30 mLs by mouth as needed for mild constipation.  . memantine (NAMENDA) 10 MG tablet Take 10 mg by mouth 2 (two) times daily.  . metoprolol tartrate (LOPRESSOR) 25 MG tablet Take 12.5 mg by mouth 2 (two) times daily. Hold for SBP <110 or HR <60/min  . Misc Natural Products (OSTEO BI-FLEX TRIPLE STRENGTH) TABS Take 1 tablet by mouth daily.  . Multiple Vitamin (MULTIVITAMIN) tablet Take 1 tablet by mouth daily.  . Nutritional Supplements (RESOURCE 2.0) LIQD Take 120 mLs by mouth 3 (three) times daily.   .  ranitidine (ZANTAC) 150 MG tablet Take 150 mg by mouth at bedtime.   . sennosides-docusate sodium (SENOKOT-S) 8.6-50 MG tablet Take 2 tablets by mouth daily.   No facility-administered encounter medications on file as of 12/09/2016.     Review of Systems  Constitutional: Negative for appetite change, chills, diaphoresis, fatigue and fever.  HENT: Positive for hearing loss. Negative for congestion, mouth sores and rhinorrhea.   Eyes: Negative for visual disturbance.  Respiratory: Negative for cough, shortness of breath and wheezing.   Cardiovascular: Negative for chest pain, palpitations and leg swelling.  Gastrointestinal: Positive for nausea. Negative for abdominal pain and vomiting.  Genitourinary: Negative for dysuria.  Musculoskeletal: Positive for gait problem.       Uses a wheelchair  Skin: Negative for rash.  Neurological: Negative for dizziness and headaches.  Psychiatric/Behavioral: Positive for confusion. Negative for agitation and behavioral problems.    Vitals:   12/09/16 1156  BP: 118/68  Pulse: 96  Resp: 18  Temp: 98.6 F (37 C)  TempSrc: Oral  SpO2: 96%  Weight: 120 lb 1.6 oz (54.5 kg)  Height: 5\' 2"  (1.575 m)   Body mass index is 21.97 kg/m.   Wt Readings from Last 3 Encounters:  12/09/16 120 lb 1.6 oz (54.5 kg)  11/15/16 124 lb (56.2 kg)  11/06/16 124 lb (56.2 kg)   Physical Exam  Constitutional: She appears well-developed and well-nourished.  Elderly female in no acute distress  HENT:  Head: Normocephalic and atraumatic.  Mouth/Throat: Oropharynx is clear and moist.  Eyes: Pupils are equal, round, and reactive to light. Conjunctivae and EOM are normal.  Neck: Normal range of motion. Neck supple. No thyromegaly present.  Cardiovascular: Normal rate and regular rhythm.   Murmur heard. Pulmonary/Chest: Effort normal and breath sounds normal. No respiratory distress. She has no wheezes.  Abdominal: Soft. Bowel sounds are normal. She exhibits no  distension. There is tenderness. There is no guarding.  Musculoskeletal: She exhibits no edema.  Can move all 4 extremities, uses wheelchair, needs 1 person assistance with transfer, OOB daily, arthritis changes to her fingers  Lymphadenopathy:    She has no cervical adenopathy.  Neurological: She is alert.  Oriented only to self  Skin: Skin is warm and dry. She is not diaphoretic.  Psychiatric: She has a normal mood and affect.  Pleasantly confused    Labs reviewed: Basic Metabolic Panel:  Recent Labs  09/18/16 2232 09/18/16 2239 09/26/16 11/04/16  NA 137 136 139 142  K 4.6 4.5 4.5 3.6  CL 99* 96*  --   --   CO2 33*  --   --   --   GLUCOSE 115* 110*  --   --   BUN 24* 30* 16 14  CREATININE 1.06* 1.00 0.9 0.9  CALCIUM 9.4  --   --   --    Liver Function Tests:  Recent Labs  05/16/16  AST 16  ALT 7  ALKPHOS 70   No results for input(s): LIPASE, AMYLASE in the last 8760 hours. No results for input(s): AMMONIA in the last 8760 hours. CBC:  Recent Labs  05/16/16 09/18/16 2232 09/18/16 2239 09/26/16  WBC 6.9 11.9*  --  10.5  NEUTROABS  --  8.4*  --   --   HGB 14.3 13.0 13.3 13.2  HCT 44 39.5 39.0 40  MCV  --  99.7  --   --   PLT 260 358  --  447*   Cardiac Enzymes: No results for input(s): CKTOTAL, CKMB, CKMBINDEX, TROPONINI in the last 8760 hours. BNP: Invalid input(s): POCBNP No results found for: HGBA1C Lab Results  Component Value Date   TSH 1.67 11/07/2016   Lab Results  Component Value Date   OIZTIWPY09 983 12/21/2013   No results found for: FOLATE Lab Results  Component Value Date   IRON <10 (L) 07/14/2007   TIBC NOT CALC Not calculated due to Iron <10. 07/14/2007   FERRITIN 2 (L) 07/14/2007    Urinalysis 09/19/16 Color- yellow, appearance- cloudy, occult blood- trace, protein-1+, nitrite- negative, leukocyte esterase- 1+, wbc- 6-10, rbc- 3-10, squamous epithelial cells- 6-10, bacteria- none seen, hyaline cast- none seen  Imaging and  Procedures obtained prior to SNF admission: Dg Ankle 2 Views Left  Result Date: 09/19/2016 CLINICAL DATA:  Left ankle fracture fixation. EXAM: DG C-ARM 61-120 MIN; LEFT ANKLE - 2 VIEW COMPARISON:  09/19/2016 FINDINGS: Intraoperative fluoroscopic images from fixation of medial and lateral malleolar fractures demonstrate sideplate and screw fixation of the distal fibula and cancellous screw fixation of the medial malleolus. The alignment is anatomic. No new fractures are seen. Soft tissue swelling. IMPRESSION: Intraoperative fluoroscopic images from fixation of the distal tibia and fibula. Fluoroscopy time 30 seconds. Electronically Signed   By: Fidela Salisbury M.D.   On: 09/19/2016 17:06   Ct Head Wo Contrast  Result Date: 09/18/2016 CLINICAL DATA:  Dementia patient post unwitnessed fall landing on left side. Struck head on bed. EXAM: CT HEAD WITHOUT CONTRAST CT CERVICAL SPINE WITHOUT CONTRAST TECHNIQUE: Multidetector CT imaging of the head and cervical spine was performed following the standard protocol without intravenous contrast.  Multiplanar CT image reconstructions of the cervical spine were also generated. COMPARISON:  Head CT 02/21/2014 FINDINGS: CT HEAD FINDINGS Brain: No evidence of acute infarction, hemorrhage, hydrocephalus, extra-axial collection or mass lesion/mass effect. Stable ventriculomegaly and ventricular morphology. Stable advanced chronic small vessel ischemia. Remote lacunar infarct in the right caudate. Vascular: Atherosclerosis of skullbase vasculature without hyperdense vessel or abnormal calcification. Skull: No skull fracture.  No focal lesion. Sinuses/Orbits: Paranasal sinuses and mastoid air cells are clear. The visualized orbits are unremarkable. Bilateral cataract resection. Other: None. CT CERVICAL SPINE FINDINGS Alignment: No traumatic malalignment. Minimal anterolisthesis of C7 on T1, retrolisthesis of C5 on C6, and anterolisthesis of C3 on C4 appears degenerative. This at  the normally aligned. Skull base and vertebrae: No acute fracture. Skullbase and dens are intact. Vertebral body marrow heterogeneity likely secondary to osteopenia. No discrete focal lesion. Soft tissues and spinal canal: No prevertebral fluid or swelling. No visible canal hematoma. Disc levels: Diffuse disc space narrowing and endplate spurring, most prominent at C5-C6. Multilevel facet arthropathy. Upper chest: No acute abnormality. Atherosclerosis of the visualized thoracic aorta. Other: Carotid calcifications. IMPRESSION: 1. No acute intracranial abnormality. No skull fracture. Stable chronic change. 2. Multilevel degenerative change in the cervical spine without acute fracture or subluxation. Electronically Signed   By: Jeb Levering M.D.   On: 09/18/2016 23:57   Ct Cervical Spine Wo Contrast  Result Date: 09/18/2016 CLINICAL DATA:  Dementia patient post unwitnessed fall landing on left side. Struck head on bed. EXAM: CT HEAD WITHOUT CONTRAST CT CERVICAL SPINE WITHOUT CONTRAST TECHNIQUE: Multidetector CT imaging of the head and cervical spine was performed following the standard protocol without intravenous contrast. Multiplanar CT image reconstructions of the cervical spine were also generated. COMPARISON:  Head CT 02/21/2014 FINDINGS: CT HEAD FINDINGS Brain: No evidence of acute infarction, hemorrhage, hydrocephalus, extra-axial collection or mass lesion/mass effect. Stable ventriculomegaly and ventricular morphology. Stable advanced chronic small vessel ischemia. Remote lacunar infarct in the right caudate. Vascular: Atherosclerosis of skullbase vasculature without hyperdense vessel or abnormal calcification. Skull: No skull fracture.  No focal lesion. Sinuses/Orbits: Paranasal sinuses and mastoid air cells are clear. The visualized orbits are unremarkable. Bilateral cataract resection. Other: None. CT CERVICAL SPINE FINDINGS Alignment: No traumatic malalignment. Minimal anterolisthesis of C7 on T1,  retrolisthesis of C5 on C6, and anterolisthesis of C3 on C4 appears degenerative. This at the normally aligned. Skull base and vertebrae: No acute fracture. Skullbase and dens are intact. Vertebral body marrow heterogeneity likely secondary to osteopenia. No discrete focal lesion. Soft tissues and spinal canal: No prevertebral fluid or swelling. No visible canal hematoma. Disc levels: Diffuse disc space narrowing and endplate spurring, most prominent at C5-C6. Multilevel facet arthropathy. Upper chest: No acute abnormality. Atherosclerosis of the visualized thoracic aorta. Other: Carotid calcifications. IMPRESSION: 1. No acute intracranial abnormality. No skull fracture. Stable chronic change. 2. Multilevel degenerative change in the cervical spine without acute fracture or subluxation. Electronically Signed   By: Jeb Levering M.D.   On: 09/18/2016 23:57   Dg Chest Port 1 View  Result Date: 09/18/2016 CLINICAL DATA:  Fall at home with open ankle fracture. EXAM: PORTABLE CHEST 1 VIEW COMPARISON:  Radiograph 02/21/2014 FINDINGS: Low lung volumes limit assessment. Mild cardiomegaly. Normal mediastinal contours. No pulmonary edema. Possible blunting of left costophrenic angle versus soft tissue attenuation. No consolidation or pneumothorax. Postsurgical change in the lower thoracic and lumbar spine, partially included. No acute osseous abnormalities are seen. The bones appear under mineralized. IMPRESSION: Low lung volumes  with mild cardiomegaly. Possible small left pleural effusion. Electronically Signed   By: Jeb Levering M.D.   On: 09/18/2016 22:52   Dg Ankle Left Port  Result Date: 09/19/2016 CLINICAL DATA:  Fracture, postreduction. EXAM: PORTABLE LEFT ANKLE - 2 VIEW COMPARISON:  Pre reduction radiographs yesterday. FINDINGS: Improved alignment of fracture subluxation of the left ankle postreduction. Tibial talar alignment is improved and near anatomic. Decrease displacement of medial malleolar and  distal fibular fractures. Posterior tibial tubercle fracture is tentatively identified. Overlying splint material in place. IMPRESSION: Improved alignment postreduction with decrease displacement of medial malleolar and distal fibular fractures in improved tibial talar alignment. Question of posterior tibial tubercle fracture. Electronically Signed   By: Jeb Levering M.D.   On: 09/19/2016 01:08   Dg Ankle Left Port  Result Date: 09/18/2016 CLINICAL DATA:  Fall at nursing home with ankle deformity. EXAM: PORTABLE LEFT ANKLE - 2 VIEW COMPARISON:  None. FINDINGS: Ankle fracture dislocation. The talus is displaced laterally with apex medial angulation with respect to the tibial plafond. Displaced transverse medial malleolar fracture with distal fragment remaining aligned with the talus. Oblique distal fibular fracture with angulation, distal fragment remains aligned with the talus. No definite posterior tibial tubercle fracture. IMPRESSION: Bimalleolar ankle fracture dislocation. Lateral displacement of the talus with fractures of the medial malleolus and distal fibula, distal fracture fragment remain aligned with the talus. No definite posterior tibial tubercle fracture is seen. Electronically Signed   By: Jeb Levering M.D.   On: 09/18/2016 22:56   Dg C-arm 1-60 Min  Result Date: 09/19/2016 CLINICAL DATA:  Left ankle fracture fixation. EXAM: DG C-ARM 61-120 MIN; LEFT ANKLE - 2 VIEW COMPARISON:  09/19/2016 FINDINGS: Intraoperative fluoroscopic images from fixation of medial and lateral malleolar fractures demonstrate sideplate and screw fixation of the distal fibula and cancellous screw fixation of the medial malleolus. The alignment is anatomic. No new fractures are seen. Soft tissue swelling. IMPRESSION: Intraoperative fluoroscopic images from fixation of the distal tibia and fibula. Fluoroscopy time 30 seconds. Electronically Signed   By: Fidela Salisbury M.D.   On: 09/19/2016 17:06     Assessment/Plan  Hypertension Stable, continuemetoprolol tartrate 12.5 mg bid  Chronic constipation Continue senokot s and MOM  Epigastric pain C/o stomach upset, her dementia limits her history taking. Has history of GERD and is on ranitidine. Start omeprazole 20 mg daily. Check cbc and cmp to assess her liver function, lytes and hemoglobin with concern for GI etiology. Vital signs stable. Denies nausea or vomiting at present.   gerd Continue ranitidine. Start omeprazole as above.  History of left ankle fracture s/p ORIF, gets around with her wheelchair. No fall reported. Has not required norco 5-325 mg q6h prn pain . D/c this. Continue scheduled tyleno, and if has pain, notify provider   Family/ staff Communication: reviewed care plan with patient and charge nurse.    Labs/tests ordered: cbc, cmp  Blanchie Serve, MD Internal Medicine [facility] [address] [place], [place] [address] Cell Phone (Monday-Friday 8 am - 5 pm): [phone] On Call: [phone] and follow prompts after 5 pm and on weekends Office Phone: [phone] Office Fax: [phone]

## 2016-12-11 ENCOUNTER — Encounter: Payer: Self-pay | Admitting: Family

## 2016-12-11 ENCOUNTER — Non-Acute Institutional Stay (SKILLED_NURSING_FACILITY): Payer: Medicare Other | Admitting: Family

## 2016-12-11 DIAGNOSIS — L97929 Non-pressure chronic ulcer of unspecified part of left lower leg with unspecified severity: Secondary | ICD-10-CM

## 2016-12-11 DIAGNOSIS — S81812A Laceration without foreign body, left lower leg, initial encounter: Secondary | ICD-10-CM | POA: Diagnosis not present

## 2016-12-11 DIAGNOSIS — R609 Edema, unspecified: Secondary | ICD-10-CM | POA: Diagnosis not present

## 2016-12-11 DIAGNOSIS — S81811S Laceration without foreign body, right lower leg, sequela: Secondary | ICD-10-CM

## 2016-12-11 NOTE — Progress Notes (Signed)
Location:  [place] Room Number: 32 Place of Service:  SNF (31) Provider: Marcellis Frampton FNP-C  Blanchie Serve, MD  Patient Care Team: Blanchie Serve, MD as PCP - General (Internal Medicine) Jettie Booze, MD as Consulting Physician (Cardiology) Marchia Bond, MD as Consulting Physician (Orthopedic Surgery) Kathrynn Ducking, MD as Consulting Physician (Neurology) Brand Males, MD as Consulting Physician (Pulmonary Disease) Bradee Common, Nelda Bucks, NP as Nurse Practitioner (Family Medicine)  Extended Emergency Contact Information Primary Emergency Contact: [place] Phone: [phone] Relation: Son Secondary Emergency Contact: Ricardo Jericho [place] of [place] Phone: [phone] Mobile Phone: [phone] Relation: Daughter  Code Status:  DNR Goals of care: Advanced Directive information Advanced Directives 12/11/2016  Does Patient Have a Medical Advance Directive? Yes  Type of Advance Directive Living will;Healthcare Power of Attorney  Does patient want to make changes to medical advance directive? -  Copy of [place] in Chart? Yes  Pre-existing out of facility DNR order (yellow form or pink MOST form) Yellow form placed in chart (order not valid for inpatient use)     Chief Complaint  Patient presents with  . Acute Visit    check bilateral leg wounds    HPI:  Pt is a 83 y.o. female seen today at [facility] for an acute visit for evaluation of lower extremities wound.She is seen in her room today per facility Nurse request.she states was sick on her stomach earlier but took some medication and now feels better.She has a nonhealing ulcer to left heel and bilateral leg skin tears. She denies any fever or chills.    Past Medical History:  Diagnosis Date  . Alzheimer's disease 06/22/2015  . Anemia    iron deficient  . Anemia   . Anxiety   . Chronic airway  obstruction, not elsewhere classified   . Chronic back pain   . Chronic cystitis   . Cystitis, chronic    Dr Gaynelle Arabian  . Degenerative arthritis   . Depression   . Disturbance of skin sensation   . Disturbance of skin sensation   . Dyslipidemia   . Dyspnea    Dr Chase Caller  . Esophageal reflux   . Essential and other specified forms of tremor 12/11/2012  . Foot drop, bilateral 12/21/2013  . Gait disorder   . GERD (gastroesophageal reflux disease)   . Hemorrhoids   . Hereditary and idiopathic peripheral neuropathy 10/11/2015  . Hernia   . History of cerebrovascular disease    Moderate level small vessel disease  . Hyperlipidemia    mild  . Hypertension   . Osteoarthritis    right knee injection per Dr Trudie Reed  . Osteoporosis   . Other abnormal clinical finding   . Pain in joint, upper arm   . Peripheral vascular disease ([place])   . Rheumatoid arthritis ([place])    Dr Trudie Reed  . Tobacco use disorder   . Tremor    and gait disorder--Dr Jannifer Franklin  . Unspecified vitamin D deficiency    Past Surgical History:  Procedure Laterality Date  . APPENDECTOMY    . BACK SURGERY Bilateral    X2  . Bladder resuspension procedure    . CATARACT EXTRACTION Bilateral   . FEMUR IM NAIL Right 02/22/2014   Procedure: INTRAMEDULLARY (IM) NAIL FEMORAL;  Surgeon: Johnny Bridge, MD;  Location: [employer];  Service: Orthopedics;  Laterality: Right;  . INGUINAL HERNIA REPAIR Bilateral   . ORIF  ANKLE FRACTURE Left 09/19/2016   Procedure: OPEN REDUCTION INTERNAL FIXATION (ORIF) ANKLE FRACTURE;  Surgeon: Vickey Huger, MD;  Location: [place];  Service: Orthopedics;  Laterality: Left;  . TONSILLECTOMY      Allergies  Allergen Reactions  . Novocain [Procaine]     Passed  out  . Limbrel [Flavocoxid]     Dizziness   . Sertraline Anxiety    Outpatient Encounter Prescriptions as of 12/11/2016  Medication Sig  . acetaminophen (TYLENOL) 650 MG CR tablet Take 650 mg by mouth 2 (two) times daily.   Marland Kitchen alum & mag  hydroxide-simeth (MAALOX/MYLANTA) 200-200-20 MG/5ML suspension Take 30 mLs by mouth every 4 (four) hours as needed for indigestion or heartburn.  . Calcium Carbonate-Vitamin D (CALTRATE 600+D) 600-400 MG-UNIT per tablet Take 1 tablet by mouth daily.  . Cholecalciferol (VITAMIN D3) 1000 units CAPS Take 1 capsule by mouth daily.  . furosemide (LASIX) 20 MG tablet Take 20 mg by mouth daily.  Marland Kitchen Hyoscyamine Sulfate SL (LEVSIN/SL) 0.125 MG SUBL Place under the tongue every 12 (twelve) hours as needed (for cramps).   . magnesium hydroxide (MILK OF MAGNESIA) 400 MG/5ML suspension Take 30 mLs by mouth as needed for mild constipation.  . memantine (NAMENDA) 10 MG tablet Take 10 mg by mouth 2 (two) times daily.  . metoprolol tartrate (LOPRESSOR) 25 MG tablet Take 12.5 mg by mouth 2 (two) times daily. Hold for SBP <110 or HR <60/min  . Misc Natural Products (OSTEO BI-FLEX TRIPLE STRENGTH) TABS Take 1 tablet by mouth daily.  . Multiple Vitamin (MULTIVITAMIN) tablet Take 1 tablet by mouth daily.  . Nutritional Supplements (RESOURCE 2.0) LIQD Take 120 mLs by mouth 3 (three) times daily.   Marland Kitchen omeprazole (PRILOSEC) 20 MG capsule Take 20 mg by mouth daily.  . ranitidine (ZANTAC) 150 MG tablet Take 150 mg by mouth at bedtime.   . sennosides-docusate sodium (SENOKOT-S) 8.6-50 MG tablet Take 2 tablets by mouth daily.  . [DISCONTINUED] HYDROcodone-acetaminophen (NORCO/VICODIN) 5-325 MG tablet Take 1 tablet by mouth every 6 (six) hours as needed for moderate pain.   No facility-administered encounter medications on file as of 12/11/2016.     Review of Systems  Constitutional: Negative for activity change, chills and fever.  Respiratory: Negative for cough, chest tightness, shortness of breath and wheezing.   Cardiovascular: Positive for leg swelling. Negative for chest pain and palpitations.  Gastrointestinal: Negative for abdominal distention, abdominal pain, constipation, diarrhea and vomiting.       Nausea per HPI    Musculoskeletal: Positive for gait problem.  Skin: Negative for pallor and rash.       Left heel ulcer, right and left  Leg skin tears.    Hematological: Does not bruise/bleed easily.  Psychiatric/Behavioral: Negative for agitation, hallucinations and sleep disturbance.    Immunization History  Administered Date(s) Administered  . Influenza Whole 01/16/2010  . Influenza-Unspecified 02/03/2006, 01/09/2007, 01/05/2013, 12/22/2014, 12/28/2015  . PPD Test 05/01/2004, 02/24/2014  . Pneumococcal Polysaccharide-23 01/21/2006  . Pneumococcal-Unspecified 12/19/1998  . Td 03/18/1998  . Tdap 09/18/2016   Pertinent  Health Maintenance Due  Topic Date Due  . INFLUENZA VACCINE  12/16/2016 (Originally 10/16/2016)  . PNA vac Low Risk Adult (2 of 2 - PCV13) 03/18/2017 (Originally 01/22/2007)  . DEXA SCAN  Completed   Fall Risk  10/28/2016 06/20/2015  Falls in the past year? No No  Risk for fall due to : - Impaired balance/gait    Vitals:   12/11/16 1154  BP: 103/70  Pulse: 80  Resp: 18  Temp: 98.8 F (37.1 C)  SpO2: 95%  Weight: 120 lb 1.6 oz (54.5 kg)  Height: 5\' 2"  (1.575 m)   Body mass index is 21.97 kg/m. Physical Exam  Constitutional: She appears well-developed and well-nourished. No distress.  HENT:  Head: Normocephalic.  Mouth/Throat: Oropharynx is clear and moist. No oropharyngeal exudate.  Eyes: Pupils are equal, round, and reactive to light. Conjunctivae and EOM are normal. Right eye exhibits no discharge. Left eye exhibits no discharge. No scleral icterus.  Neck: Normal range of motion. No JVD present. No thyromegaly present.  Cardiovascular: Normal rate, regular rhythm and normal heart sounds.  Exam reveals no gallop and no friction rub.   No murmur heard. Left pedis with dry scab unable to palpate pedal pulse diminished right pedal and bilateral dorsalis pulse   Pulmonary/Chest: Effort normal and breath sounds normal. No respiratory distress. She has no wheezes. She has no  rales.  Abdominal: Soft. Bowel sounds are normal. She exhibits no distension. There is no tenderness. There is no rebound and no guarding.  Musculoskeletal:  Moves x 4 extremities.Unsteady gait uses Front wheel walker. Bilateral lower extremities trace-1+ edema.   Lymphadenopathy:    She has no cervical adenopathy.  Neurological: Coordination normal.  Alert and oriented to person and place but disoriented to time.   Skin: Skin is warm and dry. No rash noted. No pallor.  Bilateral lower extremities/toes skin purple blue in color. Left heel quarter size unstageable ulcer without any signs of infections.  Left lateral leg calf skin tear wound bed red.No signs of infections. Left dorsal foot dry scab noted without any signs of infection.  Right lateral leg skin tear x 2 site # 1.skin tear with steristrips intact. No s/sx of infections. # 2. Skin tear wound bed red without any signs of infections.    Psychiatric: She has a normal mood and affect.    Labs reviewed:  Recent Labs  09/18/16 2232 09/18/16 2239 09/26/16 11/04/16  NA 137 136 139 142  K 4.6 4.5 4.5 3.6  CL 99* 96*  --   --   CO2 33*  --   --   --   GLUCOSE 115* 110*  --   --   BUN 24* 30* 16 14  CREATININE 1.06* 1.00 0.9 0.9  CALCIUM 9.4  --   --   --     Recent Labs  05/16/16  AST 16  ALT 7  ALKPHOS 70    Recent Labs  05/16/16 09/18/16 2232 09/18/16 2239 09/26/16  WBC 6.9 11.9*  --  10.5  NEUTROABS  --  8.4*  --   --   HGB 14.3 13.0 13.3 13.2  HCT 44 39.5 39.0 40  MCV  --  99.7  --   --   PLT 260 358  --  447*   Lab Results  Component Value Date   TSH 1.67 11/07/2016   Significant Diagnostic Results in last 30 days:  No results found.  Assessment/Plan  1. Nonhealing ulcer of left lower leg Afebrile.Left heel quarter size unstageable ulcer without any signs of infections.  Purple blue skin discoloration to toes and legs with diminished dorsalis pulses. unable to palpate left pedal pulse due to dry  scab on top of the foot.No pain reported though has history of Neuropathy.Worrisome for PVD. Will obtain ABI's rule out PVD.Consider starting Aspirin and Statin will send to vascular specialities if indicated.I've discussed patient's condition with Dr. Bubba Camp  who agrees with plan. Apply off loading boot  To prevent further skin breakdown.    2. Edema Trace-1+ to lower extremities.continue on furosemide 20 mg Tablet daily. Monitor BMP.     3.left leg skin tear  Afebrile.Left lateral leg calf skin tear wound bed red.No signs of infections.continue with wound care.continue wound care.    4. Right leg skin tear Afebrile.Right lateral leg skin tear x 2 site # 1.skin tear with steristrips intact. No s/sx of infections. # 2. Skin tear wound bed red without any signs of infections.  Continue wound care.  Family/ staff Communication: Reviewed plan of care with Dr.Pandey,patient and facility Nurse.    Labs/tests ordered: bilateral lower extremities ABI's  Nelda Bucks Amiria Orrison, NP

## 2016-12-12 DIAGNOSIS — I10 Essential (primary) hypertension: Secondary | ICD-10-CM | POA: Diagnosis not present

## 2016-12-12 DIAGNOSIS — I679 Cerebrovascular disease, unspecified: Secondary | ICD-10-CM | POA: Diagnosis not present

## 2016-12-12 DIAGNOSIS — R9430 Abnormal result of cardiovascular function study, unspecified: Secondary | ICD-10-CM | POA: Diagnosis not present

## 2016-12-12 LAB — CBC
HEMATOCRIT: 42
HGB: 13.6
PLATELET COUNT: 350
WBC: 9.8

## 2016-12-12 LAB — COMPLETE METABOLIC PANEL WITH GFR
ALT: 8
AST: 17
Albumin: 3.2
Alkaline Phosphatase: 79
BILIRUBIN TOTAL: 0.5
BUN: 24 — AB (ref 4–21)
CALCIUM: 9.4
Carbon Dioxide, Total: 28
Chloride: 103
Creat: 0.93
Glucose: 176
POTASSIUM: 4
Sodium: 139
Total Protein: 6.6 g/dL

## 2016-12-12 LAB — CBC AND DIFFERENTIAL
HEMATOCRIT: 42 (ref 36–46)
HEMOGLOBIN: 13.6 (ref 12.0–16.0)
PLATELETS: 350 (ref 150–399)
WBC: 9.8

## 2016-12-12 LAB — HEPATIC FUNCTION PANEL
ALK PHOS: 79 (ref 25–125)
ALT: 8 (ref 7–35)
AST: 17 (ref 13–35)
BILIRUBIN, TOTAL: 0.5

## 2016-12-12 LAB — BASIC METABOLIC PANEL
BUN: 24 — AB (ref 4–21)
Creatinine: 0.9 (ref 0.5–1.1)
Glucose: 176
Potassium: 4 (ref 3.4–5.3)
SODIUM: 139 (ref 137–147)

## 2016-12-13 ENCOUNTER — Non-Acute Institutional Stay (SKILLED_NURSING_FACILITY): Payer: Medicare Other | Admitting: Family

## 2016-12-13 ENCOUNTER — Encounter: Payer: Self-pay | Admitting: Family

## 2016-12-13 ENCOUNTER — Encounter: Payer: Self-pay | Admitting: *Deleted

## 2016-12-13 DIAGNOSIS — E8809 Other disorders of plasma-protein metabolism, not elsewhere classified: Secondary | ICD-10-CM

## 2016-12-13 DIAGNOSIS — R7309 Other abnormal glucose: Secondary | ICD-10-CM

## 2016-12-13 DIAGNOSIS — L853 Xerosis cutis: Secondary | ICD-10-CM | POA: Diagnosis not present

## 2016-12-13 NOTE — Progress Notes (Addendum)
Location:  [place] Room Number: 32 Place of Service:  SNF (31) Provider: Dinah Ngetich FNP-C  Blanchie Serve, MD  Patient Care Team: Blanchie Serve, MD as PCP - General (Internal Medicine) Jettie Booze, MD as Consulting Physician (Cardiology) Marchia Bond, MD as Consulting Physician (Orthopedic Surgery) Kathrynn Ducking, MD as Consulting Physician (Neurology) Brand Males, MD as Consulting Physician (Pulmonary Disease) Ngetich, Nelda Bucks, NP as Nurse Practitioner (Family Medicine)  Extended Emergency Contact Information Primary Emergency Contact: [place] of [place] Phone: [phone] Relation: Son Secondary Emergency Contact: Ricardo Jericho [place] of [place] Phone: [phone] Mobile Phone: [phone] Relation: Daughter  Code Status:  DNR Goals of care: Advanced Directive information Advanced Directives 12/13/2016  Does Patient Have a Medical Advance Directive? Yes  Type of Paramedic of [place];Living will;Out of facility DNR (pink MOST or yellow form)  Does patient want to make changes to medical advance directive? -  Copy of [place] in Chart? Yes  Pre-existing out of facility DNR order (yellow form or pink MOST form) Yellow form placed in chart (order not valid for inpatient use)     Chief Complaint  Patient presents with  . Acute Visit    Abnormal labs    HPI:  Pt is a 83 y.o. female seen today at [facility] for an acute visit for evaluation of abnormal lab results.she is seen in her room today. She denies any acute issues this visit. Her recent Alterial brachial index results showed Right ABI of 1.13 and left 0.94 results limited due to lack of compressibility of heavily calcified vessels given patient's age ( 12/12/2016).she continues to denies any leg pain walking.Her lab results showed elevated glucose level 176 and Alb 3.2 (  12/12/2016).      Past Medical History:  Diagnosis Date  . Alzheimer's disease 06/22/2015  . Anemia    iron deficient  . Anemia   . Anxiety   . Chronic airway obstruction, not elsewhere classified   . Chronic back pain   . Chronic cystitis   . Cystitis, chronic    Dr Gaynelle Arabian  . Degenerative arthritis   . Depression   . Disturbance of skin sensation   . Disturbance of skin sensation   . Dyslipidemia   . Dyspnea    Dr Chase Caller  . Esophageal reflux   . Essential and other specified forms of tremor 12/11/2012  . Foot drop, bilateral 12/21/2013  . Gait disorder   . GERD (gastroesophageal reflux disease)   . Hemorrhoids   . Hereditary and idiopathic peripheral neuropathy 10/11/2015  . Hernia   . History of cerebrovascular disease    Moderate level small vessel disease  . Hyperlipidemia    mild  . Hypertension   . Osteoarthritis    right knee injection per Dr Trudie Reed  . Osteoporosis   . Other abnormal clinical finding   . Pain in joint, upper arm   . Peripheral vascular disease ([place])   . Rheumatoid arthritis ([place])    Dr Trudie Reed  . Tobacco use disorder   . Tremor    and gait disorder--Dr Jannifer Franklin  . Unspecified vitamin D deficiency    Past Surgical History:  Procedure Laterality Date  . APPENDECTOMY    . BACK SURGERY Bilateral    X2  . Bladder resuspension procedure    . CATARACT EXTRACTION Bilateral   . FEMUR IM NAIL Right 02/22/2014   Procedure: INTRAMEDULLARY (IM) NAIL  FEMORAL;  Surgeon: Johnny Bridge, MD;  Location: [place];  Service: Orthopedics;  Laterality: Right;  . INGUINAL HERNIA REPAIR Bilateral   . ORIF ANKLE FRACTURE Left 09/19/2016   Procedure: OPEN REDUCTION INTERNAL FIXATION (ORIF) ANKLE FRACTURE;  Surgeon: Vickey Huger, MD;  Location: [place];  Service: Orthopedics;  Laterality: Left;  . TONSILLECTOMY      Allergies  Allergen Reactions  . Novocain [Procaine]     Passed  out  . Limbrel [Flavocoxid]     Dizziness   . Sertraline Anxiety    Outpatient  Encounter Prescriptions as of 12/13/2016  Medication Sig  . acetaminophen (TYLENOL) 650 MG CR tablet Take 650 mg by mouth 2 (two) times daily.   Marland Kitchen alum & mag hydroxide-simeth (MAALOX/MYLANTA) 200-200-20 MG/5ML suspension Take 30 mLs by mouth every 4 (four) hours as needed for indigestion or heartburn.  . Calcium Carbonate-Vitamin D (CALTRATE 600+D) 600-400 MG-UNIT per tablet Take 1 tablet by mouth daily.  . Cholecalciferol (VITAMIN D3) 1000 units CAPS Take 1 capsule by mouth daily.  . furosemide (LASIX) 20 MG tablet Take 20 mg by mouth daily.  Marland Kitchen Hyoscyamine Sulfate SL (LEVSIN/SL) 0.125 MG SUBL Place under the tongue every 12 (twelve) hours as needed (for cramps).   . magnesium hydroxide (MILK OF MAGNESIA) 400 MG/5ML suspension Take 30 mLs by mouth as needed for mild constipation.  . memantine (NAMENDA) 10 MG tablet Take 10 mg by mouth 2 (two) times daily.  . metoprolol tartrate (LOPRESSOR) 25 MG tablet Take 12.5 mg by mouth 2 (two) times daily. Hold for SBP <110 or HR <60/min  . Misc Natural Products (OSTEO BI-FLEX TRIPLE STRENGTH) TABS Take 1 tablet by mouth daily.  . Multiple Vitamin (MULTIVITAMIN) tablet Take 1 tablet by mouth daily.  . Nutritional Supplements (RESOURCE 2.0) LIQD Take 120 mLs by mouth 3 (three) times daily.   Marland Kitchen omeprazole (PRILOSEC) 20 MG capsule Take 20 mg by mouth daily.  . ranitidine (ZANTAC) 150 MG tablet Take 150 mg by mouth at bedtime.   . sennosides-docusate sodium (SENOKOT-S) 8.6-50 MG tablet Take 2 tablets by mouth daily.   No facility-administered encounter medications on file as of 12/13/2016.     Review of Systems  Constitutional: Negative for activity change, appetite change, chills, fatigue and fever.  Respiratory: Negative for cough, chest tightness and shortness of breath.   Cardiovascular: Negative for chest pain, palpitations and leg swelling.  Gastrointestinal: Negative for abdominal distention, abdominal pain, constipation, diarrhea, nausea and vomiting.   Endocrine: Negative for polydipsia, polyphagia and polyuria.  Genitourinary: Negative for dysuria, frequency and urgency.  Skin: Negative for pallor and rash.       Left heel ulcer and right leg skin tear dressing managed by Nurse.   Neurological: Negative for dizziness, light-headedness and headaches.  Psychiatric/Behavioral: Negative for agitation, confusion, hallucinations and sleep disturbance. The patient is not nervous/anxious.     Immunization History  Administered Date(s) Administered  . Influenza Whole 01/16/2010  . Influenza-Unspecified 02/03/2006, 01/09/2007, 01/05/2013, 12/22/2014, 12/28/2015  . PPD Test 05/01/2004, 02/24/2014  . Pneumococcal Polysaccharide-23 01/21/2006  . Pneumococcal-Unspecified 12/19/1998  . Td 03/18/1998  . Tdap 09/18/2016   Pertinent  Health Maintenance Due  Topic Date Due  . INFLUENZA VACCINE  12/16/2016 (Originally 10/16/2016)  . PNA vac Low Risk Adult (2 of 2 - PCV13) 03/18/2017 (Originally 01/22/2007)  . DEXA SCAN  Completed   Fall Risk  10/28/2016 06/20/2015  Falls in the past year? No No  Risk for fall due to : -  Impaired balance/gait    Vitals:   12/13/16 1056  BP: 133/71  Pulse: 98  Resp: 16  Temp: (!) 97.4 F (36.3 C)  SpO2: 96%  Weight: 123 lb (55.8 kg)  Height: 5\' 2"  (1.575 m)   Body mass index is 22.5 kg/m. Physical Exam  Constitutional: She appears well-developed and well-nourished. No distress.  HENT:  Head: Normocephalic.  Mouth/Throat: Oropharynx is clear and moist. No oropharyngeal exudate.  Eyes: Pupils are equal, round, and reactive to light. Conjunctivae and EOM are normal. Right eye exhibits no discharge. Left eye exhibits no discharge. No scleral icterus.  Neck: Normal range of motion. No JVD present. No thyromegaly present.  Cardiovascular: Normal rate, regular rhythm, normal heart sounds and intact distal pulses.  Exam reveals no gallop and no friction rub.   No murmur heard. Pulmonary/Chest: Effort normal and  breath sounds normal. No respiratory distress. She has no wheezes. She has no rales.  Abdominal: Soft. Bowel sounds are normal. She exhibits no distension. There is no tenderness. There is no rebound and no guarding.  Musculoskeletal: She exhibits no tenderness.  Unsteady gait uses FWW with assistance but spends most time self propelling on wheelchair. Right leg Ted hose in place   Lymphadenopathy:    She has no cervical adenopathy.  Neurological: Coordination normal.  Alert and oriented to person, place but not time.   Skin: Skin is warm and dry. No rash noted. No erythema. No pallor.  Left lower extremity toes purple blue color persist.overall right leg purple blue color improved this visit with patient sitting on recliner and legs elevated.  Lower extremities skin very dry and scaly.   Psychiatric: She has a normal mood and affect.   Labs reviewed:  Recent Labs  09/18/16 2232 09/18/16 2239 09/26/16 11/04/16  NA 137 136 139 142  K 4.6 4.5 4.5 3.6  CL 99* 96*  --   --   CO2 33*  --   --   --   GLUCOSE 115* 110*  --   --   BUN 24* 30* 16 14  CREATININE 1.06* 1.00 0.9 0.9  CALCIUM 9.4  --   --   --     Recent Labs  05/16/16  AST 16  ALT 7  ALKPHOS 70    Recent Labs  05/16/16 09/18/16 2232 09/18/16 2239 09/26/16  WBC 6.9 11.9*  --  10.5  NEUTROABS  --  8.4*  --   --   HGB 14.3 13.0 13.3 13.2  HCT 44 39.5 39.0 40  MCV  --  99.7  --   --   PLT 260 358  --  447*   Lab Results  Component Value Date   TSH 1.67 11/07/2016   No results found for: HGBA1C No results found for: CHOL, HDL, LDLCALC, LDLDIRECT, TRIG, CHOLHDL  Significant Diagnostic Results in last 30 days:  No results found.  Assessment/Plan 1. Elevated glucose level glucose level 176 ( 12/12/2016). No signs and symptoms of hyperglycemia. Check Hgb A1C and Lipid panel 12/16/2016.      2. Hypoalbuminemia Alb 3.2 ( 12/12/2016).continue on protein supplements. Continue to monitor CMP.  3. Dry skin  dermatitis  Lower extremities skin very dry and scaly. Apply Aquaphor ointment twice daily x 8 weeks.      Family/ staff Communication: Reviewed plan of care with patient and facility Nurse.   Labs/tests ordered: Hgb A1C and Lipid panel 12/16/2016. Sandrea Hughs, NP

## 2016-12-16 DIAGNOSIS — I10 Essential (primary) hypertension: Secondary | ICD-10-CM | POA: Diagnosis not present

## 2016-12-16 DIAGNOSIS — R7309 Other abnormal glucose: Secondary | ICD-10-CM | POA: Diagnosis not present

## 2016-12-16 LAB — LIPID PANEL
CHOLESTEROL: 174 (ref 0–200)
Cholesterol: 174 (ref 0–200)
HDL: 68 (ref 35–70)
HDL: 68 (ref 35–70)
LDL CALC: 80
LDL Cholesterol: 80
TRIGLYCERIDES: 159 (ref 40–160)

## 2016-12-16 LAB — HEMOGLOBIN A1C: HEMOGLOBIN A1C: 5.8

## 2017-01-01 DIAGNOSIS — M6281 Muscle weakness (generalized): Secondary | ICD-10-CM | POA: Diagnosis not present

## 2017-01-01 DIAGNOSIS — Z4889 Encounter for other specified surgical aftercare: Secondary | ICD-10-CM | POA: Diagnosis not present

## 2017-01-01 DIAGNOSIS — R279 Unspecified lack of coordination: Secondary | ICD-10-CM | POA: Diagnosis not present

## 2017-01-01 DIAGNOSIS — N814 Uterovaginal prolapse, unspecified: Secondary | ICD-10-CM | POA: Diagnosis not present

## 2017-01-01 DIAGNOSIS — R0600 Dyspnea, unspecified: Secondary | ICD-10-CM | POA: Diagnosis not present

## 2017-01-01 DIAGNOSIS — M4800 Spinal stenosis, site unspecified: Secondary | ICD-10-CM | POA: Diagnosis not present

## 2017-01-01 DIAGNOSIS — R262 Difficulty in walking, not elsewhere classified: Secondary | ICD-10-CM | POA: Diagnosis not present

## 2017-01-01 DIAGNOSIS — R296 Repeated falls: Secondary | ICD-10-CM | POA: Diagnosis not present

## 2017-01-01 DIAGNOSIS — Z9181 History of falling: Secondary | ICD-10-CM | POA: Diagnosis not present

## 2017-01-01 DIAGNOSIS — R270 Ataxia, unspecified: Secondary | ICD-10-CM | POA: Diagnosis not present

## 2017-01-01 DIAGNOSIS — R2681 Unsteadiness on feet: Secondary | ICD-10-CM | POA: Diagnosis not present

## 2017-01-01 DIAGNOSIS — M5134 Other intervertebral disc degeneration, thoracic region: Secondary | ICD-10-CM | POA: Diagnosis not present

## 2017-01-01 DIAGNOSIS — R2990 Unspecified symptoms and signs involving the nervous system: Secondary | ICD-10-CM | POA: Diagnosis not present

## 2017-01-02 DIAGNOSIS — R296 Repeated falls: Secondary | ICD-10-CM | POA: Diagnosis not present

## 2017-01-02 DIAGNOSIS — M5134 Other intervertebral disc degeneration, thoracic region: Secondary | ICD-10-CM | POA: Diagnosis not present

## 2017-01-02 DIAGNOSIS — N814 Uterovaginal prolapse, unspecified: Secondary | ICD-10-CM | POA: Diagnosis not present

## 2017-01-02 DIAGNOSIS — Z8781 Personal history of (healed) traumatic fracture: Secondary | ICD-10-CM | POA: Diagnosis not present

## 2017-01-02 DIAGNOSIS — M6281 Muscle weakness (generalized): Secondary | ICD-10-CM | POA: Diagnosis not present

## 2017-01-02 DIAGNOSIS — M4800 Spinal stenosis, site unspecified: Secondary | ICD-10-CM | POA: Diagnosis not present

## 2017-01-02 DIAGNOSIS — R2681 Unsteadiness on feet: Secondary | ICD-10-CM | POA: Diagnosis not present

## 2017-01-02 LAB — VITAMIN D 25 HYDROXY (VIT D DEFICIENCY, FRACTURES): Vit D, 25-Hydroxy: 58

## 2017-01-03 ENCOUNTER — Encounter: Payer: Self-pay | Admitting: Family

## 2017-01-03 ENCOUNTER — Non-Acute Institutional Stay (SKILLED_NURSING_FACILITY): Payer: Medicare Other | Admitting: Family

## 2017-01-03 DIAGNOSIS — L89612 Pressure ulcer of right heel, stage 2: Secondary | ICD-10-CM

## 2017-01-03 DIAGNOSIS — R634 Abnormal weight loss: Secondary | ICD-10-CM | POA: Diagnosis not present

## 2017-01-03 DIAGNOSIS — S81812A Laceration without foreign body, left lower leg, initial encounter: Secondary | ICD-10-CM | POA: Diagnosis not present

## 2017-01-03 NOTE — Progress Notes (Signed)
Location:  [place] Room Number: 32 Place of Service:  SNF (31) Provider: Lakina Mcintire FNP-C  Blanchie Serve, MD  Patient Care Team: Blanchie Serve, MD as PCP - General (Internal Medicine) Jettie Booze, MD as Consulting Physician (Cardiology) Marchia Bond, MD as Consulting Physician (Orthopedic Surgery) Kathrynn Ducking, MD as Consulting Physician (Neurology) Brand Males, MD as Consulting Physician (Pulmonary Disease) Susanna Benge, Nelda Bucks, NP as Nurse Practitioner (Family Medicine)  Extended Emergency Contact Information Primary Emergency Contact: [place] Phone: [phone] Relation: Son Secondary Emergency Contact: Ricardo Jericho [place] of [place] Phone: [phone] Mobile Phone: [phone] Relation: Daughter  Code Status:  DNR Goals of care: Advanced Directive information Advanced Directives 01/03/2017  Does Patient Have a Medical Advance Directive? Yes  Type of Advance Directive Out of facility DNR (pink MOST or yellow form);Living will;Healthcare Power of Attorney  Does patient want to make changes to medical advance directive? -  Copy of [place] in Chart? Yes  Pre-existing out of facility DNR order (yellow form or pink MOST form) Yellow form placed in chart (order not valid for inpatient use)     Chief Complaint  Patient presents with  . Acute Visit    recheck wound    HPI:  Pt is a 83 y.o. female seen today at [facility] for an acute visit for evaluation of left heel pressure ulcer.She is seen in her room today with Nurse at bedside.Her left heel ulcer has worsen. No fever or chills reported.she has had a 4 pounds weight loss over seven days.     Past Medical History:  Diagnosis Date  . Alzheimer's disease 06/22/2015  . Anemia    iron deficient  . Anemia   . Anxiety   . Chronic airway obstruction, not elsewhere classified   . Chronic back  pain   . Chronic cystitis   . Cystitis, chronic    Dr Gaynelle Arabian  . Degenerative arthritis   . Depression   . Disturbance of skin sensation   . Disturbance of skin sensation   . Dyslipidemia   . Dyspnea    Dr Chase Caller  . Esophageal reflux   . Essential and other specified forms of tremor 12/11/2012  . Foot drop, bilateral 12/21/2013  . Gait disorder   . GERD (gastroesophageal reflux disease)   . Hemorrhoids   . Hereditary and idiopathic peripheral neuropathy 10/11/2015  . Hernia   . History of cerebrovascular disease    Moderate level small vessel disease  . Hyperlipidemia    mild  . Hypertension   . Osteoarthritis    right knee injection per Dr Trudie Reed  . Osteoporosis   . Other abnormal clinical finding   . Pain in joint, upper arm   . Peripheral vascular disease ([place])   . Rheumatoid arthritis ([place])    Dr Trudie Reed  . Tobacco use disorder   . Tremor    and gait disorder--Dr Jannifer Franklin  . Unspecified vitamin D deficiency    Past Surgical History:  Procedure Laterality Date  . APPENDECTOMY    . BACK SURGERY Bilateral    X2  . Bladder resuspension procedure    . CATARACT EXTRACTION Bilateral   . FEMUR IM NAIL Right 02/22/2014   Procedure: INTRAMEDULLARY (IM) NAIL FEMORAL;  Surgeon: Johnny Bridge, MD;  Location: [place];  Service: Orthopedics;  Laterality: Right;  . INGUINAL HERNIA REPAIR Bilateral   . ORIF ANKLE FRACTURE Left 09/19/2016  Procedure: OPEN REDUCTION INTERNAL FIXATION (ORIF) ANKLE FRACTURE;  Surgeon: Vickey Huger, MD;  Location: [place];  Service: Orthopedics;  Laterality: Left;  . TONSILLECTOMY      Allergies  Allergen Reactions  . Novocain [Procaine]     Passed  out  . Limbrel [Flavocoxid]     Dizziness   . Sertraline Anxiety    Outpatient Encounter Prescriptions as of 01/03/2017  Medication Sig  . acetaminophen (TYLENOL) 650 MG CR tablet Take 650 mg by mouth 2 (two) times daily.   Marland Kitchen alum & mag hydroxide-simeth (MAALOX/MYLANTA) 200-200-20 MG/5ML  suspension Take 30 mLs by mouth every 4 (four) hours as needed for indigestion or heartburn.  . Amino Acids-Protein Hydrolys (FEEDING SUPPLEMENT, PRO-STAT SUGAR FREE 64,) LIQD Take 30 mLs by mouth daily.  . Calcium Carbonate-Vitamin D (CALTRATE 600+D) 600-400 MG-UNIT per tablet Take 1 tablet by mouth daily.  . Cholecalciferol (VITAMIN D3) 1000 units CAPS Take 1 capsule by mouth daily.  . furosemide (LASIX) 20 MG tablet Take 20 mg by mouth daily.  Marland Kitchen Hyoscyamine Sulfate SL (LEVSIN/SL) 0.125 MG SUBL Place under the tongue every 12 (twelve) hours as needed (for cramps).   . magnesium hydroxide (MILK OF MAGNESIA) 400 MG/5ML suspension Take 30 mLs by mouth as needed for mild constipation.  . memantine (NAMENDA) 10 MG tablet Take 10 mg by mouth 2 (two) times daily.  . metoprolol tartrate (LOPRESSOR) 25 MG tablet Take 12.5 mg by mouth 2 (two) times daily. Hold for SBP <110 or HR <60/min  . mineral oil-hydrophilic petrolatum (AQUAPHOR) ointment Apply 1 application topically 2 (two) times daily.  . Misc Natural Products (OSTEO BI-FLEX TRIPLE STRENGTH) TABS Take 1 tablet by mouth daily.  . Multiple Vitamin (MULTIVITAMIN) tablet Take 1 tablet by mouth daily.  . Nutritional Supplements (RESOURCE 2.0) LIQD Take 120 mLs by mouth 3 (three) times daily.   Marland Kitchen omeprazole (PRILOSEC) 20 MG capsule Take 20 mg by mouth daily.  . ranitidine (ZANTAC) 150 MG tablet Take 150 mg by mouth at bedtime.   . sennosides-docusate sodium (SENOKOT-S) 8.6-50 MG tablet Take 2 tablets by mouth daily.   No facility-administered encounter medications on file as of 01/03/2017.     Review of Systems  Constitutional: Negative for activity change, appetite change, chills, fatigue and fever.       4 pounds weight loss over 7 days  HENT: Negative for congestion, rhinorrhea, sinus pain, sinus pressure, sneezing and sore throat.   Respiratory: Negative for cough, chest tightness, shortness of breath and wheezing.   Cardiovascular: Negative  for chest pain, palpitations and leg swelling.  Gastrointestinal: Negative for abdominal distention, abdominal pain, constipation, diarrhea and vomiting.       Chronic complains of nausea   Genitourinary: Negative for dysuria, frequency and urgency.  Musculoskeletal: Positive for gait problem.  Skin: Negative for color change, pallor and rash.       Left heel ulcer Left inner lateral leg skin tear Chronic venous dermatitis. Hx PVD Alterial brachial index results showed Right ABI of 1.13 and left 0.94 results limited due to lack of compressibility of heavily calcified vessels given patient's age ( 12/12/2016).  Neurological: Negative for dizziness, syncope, light-headedness and headaches.  Hematological: Bruises/bleeds easily.  Psychiatric/Behavioral: Positive for confusion. Negative for agitation and sleep disturbance. The patient is not nervous/anxious.     Immunization History  Administered Date(s) Administered  . Influenza Whole 01/16/2010  . Influenza-Unspecified 02/03/2006, 01/09/2007, 01/05/2013, 12/22/2014, 12/28/2015, 12/26/2016  . PPD Test 05/01/2004, 02/24/2014  . Pneumococcal  Polysaccharide-23 01/21/2006  . Pneumococcal-Unspecified 12/19/1998  . Td 03/18/1998  . Tdap 09/18/2016   Pertinent  Health Maintenance Due  Topic Date Due  . PNA vac Low Risk Adult (2 of 2 - PCV13) 03/18/2017 (Originally 01/22/2007)  . INFLUENZA VACCINE  Completed  . DEXA SCAN  Completed   Fall Risk  10/28/2016 06/20/2015  Falls in the past year? No No  Risk for fall due to : - Impaired balance/gait    Vitals:   01/03/17 1202  BP: (!) 142/86  Pulse: 92  Resp: 20  Temp: 99.2 F (37.3 C)  SpO2: 95%  Weight: 117 lb 14.4 oz (53.5 kg)  Height: 5\' 2"  (1.575 m)   Body mass index is 21.56 kg/m. Physical Exam  Constitutional: She appears well-developed and well-nourished.  Pleasantly confused elderly in no acute distress able to make needs known.   HENT:  Head: Normocephalic.  Right Ear:  External ear normal.  Left Ear: External ear normal.  Mouth/Throat: Oropharynx is clear and moist. No oropharyngeal exudate.  HOH  Eyes: Pupils are equal, round, and reactive to light. Conjunctivae and EOM are normal. Right eye exhibits no discharge. Left eye exhibits no discharge. No scleral icterus.  Neck: Normal range of motion. No JVD present. No thyromegaly present.  Cardiovascular: Normal rate, regular rhythm, normal heart sounds and intact distal pulses.  Exam reveals no gallop and no friction rub.   No murmur heard. Pulmonary/Chest: Effort normal and breath sounds normal. No respiratory distress. She has no wheezes. She has no rales.  Abdominal: Soft. Bowel sounds are normal. She exhibits no distension and no mass. There is no tenderness. There is no rebound and no guarding.  Genitourinary:  Genitourinary Comments: incontinent   Musculoskeletal: Normal range of motion. She exhibits no tenderness.  Unsteady gait self propels on wheel chair. Bilateral lower extremities trace edema.  Lymphadenopathy:    She has no cervical adenopathy.  Neurological: Coordination normal.  Pleasantly confused at her baseline  Skin: Skin is warm and dry. No rash noted. No pallor.  1. Left lateral leg skin tear with flap without any drainage. Surrounding skin tissue without any signs of infections.   2. Left heel quarter size ulcer wound bed yellow without any drainage noted. Peri-wound and surrounding skin tissue without any signs of infections.  3. Bilateral leg chronic venous dermatitis with purple-blue skin discoloration.Color improves with elevation of extremities.Positive pedal pulses to palpation. Previous ABI's negative.     Labs reviewed:  Recent Labs  09/18/16 2232 09/18/16 2239 09/26/16 11/04/16 12/12/16  NA 137 136 139 142 139  K 4.6 4.5 4.5 3.6 4.0  CL 99* 96*  --   --  103  CO2 33*  --   --   --  28  GLUCOSE 115* 110*  --   --   --   BUN 24* 30* 16 14 24*  CREATININE 1.06* 1.00 0.9  0.9 0.93  CALCIUM 9.4  --   --   --  9.4    Recent Labs  05/16/16 12/12/16  AST 16 17  ALT 7 8  ALKPHOS 70 79  BILITOT  --  0.5  PROT  --  6.6  ALBUMIN  --  3.2    Recent Labs  05/16/16 09/18/16 2232 09/18/16 2239 09/26/16 12/12/16  WBC 6.9 11.9*  --  10.5 9.8  NEUTROABS  --  8.4*  --   --   --   HGB 14.3 13.0 13.3 13.2 13.6  HCT 44  39.5 39.0 40 42  MCV  --  99.7  --   --   --   PLT 260 358  --  447*  --    Lab Results  Component Value Date   TSH 1.67 11/07/2016   Lab Results  Component Value Date   HGBA1C 5.8 12/16/2016   Lab Results  Component Value Date   CHOL 174 12/16/2016   HDL 68 12/16/2016   LDLCALC 80 12/16/2016   TRIG 159 12/16/2016    Significant Diagnostic Results in last 30 days:  No results found.  Assessment/Plan 1. Pressure injury of left heel, stage 2 Afebrile.wound bed pale yellow tissue noted without any drainage.Surrounding skin without any signs of infections. Cleanse ulcer with saline, pat dry, apply santyl and cover with hydrocolloid dressing. Change dressing every three days. Continue on MVI and protein supplement.   2. Weight loss Has had a 4 pound weight loss over 7 days. Continue on protein supplements. Add Remeron 7.5 mg tablet at bedtime for appetite stimulant.  3. Left lateral leg skin tear with flap No signs of infections.Continue to cleanse with saline and cover with non adhesive dressing. Monitor for signs of bleeding or infections.   Family/ staff Communication: Reviewed plan of care with patient and facility Nurse.   Labs/tests ordered: None   Meliza Kage C Mahogani Holohan, NP

## 2017-01-05 MED ORDER — MIRTAZAPINE 7.5 MG PO TABS: 7.5000 mg | ORAL_TABLET | Freq: Every day | ORAL | Status: DC

## 2017-01-07 DIAGNOSIS — M5134 Other intervertebral disc degeneration, thoracic region: Secondary | ICD-10-CM | POA: Diagnosis not present

## 2017-01-07 DIAGNOSIS — N814 Uterovaginal prolapse, unspecified: Secondary | ICD-10-CM | POA: Diagnosis not present

## 2017-01-07 DIAGNOSIS — R296 Repeated falls: Secondary | ICD-10-CM | POA: Diagnosis not present

## 2017-01-07 DIAGNOSIS — M4800 Spinal stenosis, site unspecified: Secondary | ICD-10-CM | POA: Diagnosis not present

## 2017-01-07 DIAGNOSIS — M6281 Muscle weakness (generalized): Secondary | ICD-10-CM | POA: Diagnosis not present

## 2017-01-07 DIAGNOSIS — R2681 Unsteadiness on feet: Secondary | ICD-10-CM | POA: Diagnosis not present

## 2017-01-09 ENCOUNTER — Non-Acute Institutional Stay (SKILLED_NURSING_FACILITY): Payer: Medicare Other | Admitting: Family

## 2017-01-09 ENCOUNTER — Encounter: Payer: Self-pay | Admitting: Family

## 2017-01-09 DIAGNOSIS — R296 Repeated falls: Secondary | ICD-10-CM | POA: Diagnosis not present

## 2017-01-09 DIAGNOSIS — S81811D Laceration without foreign body, right lower leg, subsequent encounter: Secondary | ICD-10-CM | POA: Diagnosis not present

## 2017-01-09 DIAGNOSIS — R634 Abnormal weight loss: Secondary | ICD-10-CM | POA: Diagnosis not present

## 2017-01-09 DIAGNOSIS — F0281 Dementia in other diseases classified elsewhere with behavioral disturbance: Secondary | ICD-10-CM

## 2017-01-09 DIAGNOSIS — L89622 Pressure ulcer of left heel, stage 2: Secondary | ICD-10-CM

## 2017-01-09 DIAGNOSIS — R2681 Unsteadiness on feet: Secondary | ICD-10-CM | POA: Diagnosis not present

## 2017-01-09 DIAGNOSIS — G309 Alzheimer's disease, unspecified: Secondary | ICD-10-CM

## 2017-01-09 DIAGNOSIS — E559 Vitamin D deficiency, unspecified: Secondary | ICD-10-CM

## 2017-01-09 DIAGNOSIS — N814 Uterovaginal prolapse, unspecified: Secondary | ICD-10-CM | POA: Diagnosis not present

## 2017-01-09 DIAGNOSIS — M6281 Muscle weakness (generalized): Secondary | ICD-10-CM | POA: Diagnosis not present

## 2017-01-09 DIAGNOSIS — K219 Gastro-esophageal reflux disease without esophagitis: Secondary | ICD-10-CM | POA: Diagnosis not present

## 2017-01-09 DIAGNOSIS — M4800 Spinal stenosis, site unspecified: Secondary | ICD-10-CM | POA: Diagnosis not present

## 2017-01-09 DIAGNOSIS — I10 Essential (primary) hypertension: Secondary | ICD-10-CM | POA: Diagnosis not present

## 2017-01-09 DIAGNOSIS — M5134 Other intervertebral disc degeneration, thoracic region: Secondary | ICD-10-CM | POA: Diagnosis not present

## 2017-01-09 DIAGNOSIS — K5901 Slow transit constipation: Secondary | ICD-10-CM

## 2017-01-09 NOTE — Progress Notes (Signed)
Location:  [place] Room Number: 32 Place of Service:  SNF (31) Provider: Laurin Paulo FNP-C   Blanchie Serve, MD  Patient Care Team: Blanchie Serve, MD as PCP - General (Internal Medicine) Jettie Booze, MD as Consulting Physician (Cardiology) Marchia Bond, MD as Consulting Physician (Orthopedic Surgery) Kathrynn Ducking, MD as Consulting Physician (Neurology) Brand Males, MD as Consulting Physician (Pulmonary Disease) Jenevie Casstevens, Nelda Bucks, NP as Nurse Practitioner (Family Medicine)  Extended Emergency Contact Information Primary Emergency Contact: [place] Phone: [phone] Relation: Son Secondary Emergency Contact: Ricardo Jericho [place] of [place] Phone: [phone] Mobile Phone: [phone] Relation: Daughter  Code Status:DNR  Goals of care: Advanced Directive information Advanced Directives 01/09/2017  Does Patient Have a Medical Advance Directive? Yes  Type of Advance Directive Out of facility DNR (pink MOST or yellow form);Living will;Healthcare Power of Attorney  Does patient want to make changes to medical advance directive? -  Copy of [place] in Chart? Yes  Pre-existing out of facility DNR order (yellow form or pink MOST form) Yellow form placed in chart (order not valid for inpatient use)     Chief Complaint  Patient presents with  . Acute Visit    recheck wounds; also check feet-was rolled over by wheelchair    HPI:  Pt is a 83 y.o. female seen today [place] for medical management of chronic diseases.she has a medical history of HTN, GERD, Alzheimer's dementia, CVA, Peripheral neuropathy among other conditions. She is seen in her room today with facility Nurse at bedside. She denies any acute issues this visit. She continues to self propel on her wheelchair and uses walker with supervision for short distance. Her left heel ulcer continues  to be managed by facility Nurse. She has had no recent fall episode.Her weight continues to progressively decline: 12/11/2016 wt 123 lbs; 12/18/2016 wt 121.5 lbs; 12/25/2016 wt 122 lbs; 01/01/2017 wt 117.9 lbs; 01/08/2017 wt 116.5 lbs. Nurse reports patient not eating well just 25-50 % of meals but drinks her protein supplement 75-100 %.      Past Medical History:  Diagnosis Date  . Alzheimer's disease 06/22/2015  . Anemia    iron deficient  . Anemia   . Anxiety   . Chronic airway obstruction, not elsewhere classified   . Chronic back pain   . Chronic cystitis   . Cystitis, chronic    Dr Gaynelle Arabian  . Degenerative arthritis   . Depression   . Disturbance of skin sensation   . Disturbance of skin sensation   . Dyslipidemia   . Dyspnea    Dr Chase Caller  . Esophageal reflux   . Essential and other specified forms of tremor 12/11/2012  . Foot drop, bilateral 12/21/2013  . Gait disorder   . GERD (gastroesophageal reflux disease)   . Hemorrhoids   . Hereditary and idiopathic peripheral neuropathy 10/11/2015  . Hernia   . History of cerebrovascular disease    Moderate level small vessel disease  . Hyperlipidemia    mild  . Hypertension   . Osteoarthritis    right knee injection per Dr Trudie Reed  . Osteoporosis   . Other abnormal clinical finding   . Pain in joint, upper arm   . Peripheral vascular disease ([place])   . Rheumatoid arthritis ([place])    Dr Trudie Reed  . Tobacco use disorder   . Tremor    and gait disorder--Dr Jannifer Franklin  . Unspecified vitamin D  deficiency    Past Surgical History:  Procedure Laterality Date  . APPENDECTOMY    . BACK SURGERY Bilateral    X2  . Bladder resuspension procedure    . CATARACT EXTRACTION Bilateral   . FEMUR IM NAIL Right 02/22/2014   Procedure: INTRAMEDULLARY (IM) NAIL FEMORAL;  Surgeon: Johnny Bridge, MD;  Location: [place];  Service: Orthopedics;  Laterality: Right;  . INGUINAL HERNIA REPAIR Bilateral   . ORIF ANKLE FRACTURE Left 09/19/2016    Procedure: OPEN REDUCTION INTERNAL FIXATION (ORIF) ANKLE FRACTURE;  Surgeon: Vickey Huger, MD;  Location: [facility];  Service: Orthopedics;  Laterality: Left;  . TONSILLECTOMY      Allergies  Allergen Reactions  . Novocain [Procaine]     Passed  out  . Limbrel [Flavocoxid]     Dizziness   . Sertraline Anxiety    Allergies as of 01/09/2017      Reactions   Novocain [procaine]    Passed  out   Limbrel [flavocoxid]    Dizziness   Sertraline Anxiety      Medication List       Accurate as of 01/09/17  1:22 PM. Always use your most recent med list.          acetaminophen 650 MG CR tablet Commonly known as:  TYLENOL Take 650 mg by mouth 2 (two) times daily.   alum & mag hydroxide-simeth 200-200-20 MG/5ML suspension Commonly known as:  MAALOX/MYLANTA Take 30 mLs by mouth every 4 (four) hours as needed for indigestion or heartburn.   CALTRATE 600+D 600-400 MG-UNIT tablet Generic drug:  Calcium Carbonate-Vitamin D Take 1 tablet by mouth daily.   feeding supplement (PRO-STAT SUGAR FREE 64) Liqd Take 30 mLs by mouth daily.   furosemide 20 MG tablet Commonly known as:  LASIX Take 20 mg by mouth daily.   LEVSIN/SL 0.125 MG Subl Generic drug:  Hyoscyamine Sulfate SL Place under the tongue every 12 (twelve) hours as needed (for cramps).   magnesium hydroxide 400 MG/5ML suspension Commonly known as:  MILK OF MAGNESIA Take 30 mLs by mouth as needed for mild constipation.   memantine 10 MG tablet Commonly known as:  NAMENDA Take 10 mg by mouth 2 (two) times daily.   metoprolol tartrate 25 MG tablet Commonly known as:  LOPRESSOR Take 12.5 mg by mouth 2 (two) times daily. Hold for SBP <110 or HR <60/min   mineral oil-hydrophilic petrolatum ointment Apply 1 application topically 2 (two) times daily.   mirtazapine 7.5 MG tablet Commonly known as:  REMERON Take 1 tablet (7.5 mg total) by mouth at bedtime.   multivitamin tablet Take 1 tablet by mouth daily.   omeprazole  20 MG capsule Commonly known as:  PRILOSEC Take 20 mg by mouth daily.   OSTEO BI-FLEX TRIPLE STRENGTH Tabs Take 1 tablet by mouth daily.   ranitidine 150 MG tablet Commonly known as:  ZANTAC Take 150 mg by mouth at bedtime.   RESOURCE 2.0 Liqd Take 120 mLs by mouth 3 (three) times daily.   sennosides-docusate sodium 8.6-50 MG tablet Commonly known as:  SENOKOT-S Take 2 tablets by mouth daily.   Vitamin D3 1000 units Caps Take 1 capsule by mouth daily.       Review of Systems  Constitutional: Negative for activity change, appetite change, chills, fatigue and fever.  HENT: Positive for hearing loss. Negative for congestion, rhinorrhea, sinus pain, sinus pressure, sneezing, sore throat and trouble swallowing.   Eyes: Negative for discharge and redness.  Wears eye glasses   Respiratory: Negative for cough, chest tightness, shortness of breath and wheezing.   Cardiovascular: Positive for leg swelling. Negative for chest pain and palpitations.  Gastrointestinal: Negative for abdominal distention, abdominal pain, constipation, diarrhea, nausea and vomiting.  Endocrine: Negative for cold intolerance, heat intolerance, polydipsia, polyphagia and polyuria.  Genitourinary: Negative for dysuria, frequency and urgency.  Musculoskeletal: Positive for gait problem.  Skin: Negative for pallor and rash.  Neurological: Negative for dizziness, syncope, light-headedness and headaches.  Hematological: Does not bruise/bleed easily.  Psychiatric/Behavioral: Positive for confusion. Negative for agitation and sleep disturbance. The patient is not nervous/anxious.     Immunization History  Administered Date(s) Administered  . Influenza Whole 01/16/2010  . Influenza-Unspecified 02/03/2006, 01/09/2007, 01/05/2013, 12/22/2014, 12/28/2015, 12/26/2016  . PPD Test 05/01/2004, 02/24/2014  . Pneumococcal Polysaccharide-23 01/21/2006  . Pneumococcal-Unspecified 12/19/1998  . Td 03/18/1998  . Tdap  09/18/2016   Pertinent  Health Maintenance Due  Topic Date Due  . PNA vac Low Risk Adult (2 of 2 - PCV13) 03/18/2017 (Originally 01/22/2007)  . INFLUENZA VACCINE  Completed  . DEXA SCAN  Completed   Fall Risk  10/28/2016 06/20/2015  Falls in the past year? No No  Risk for fall due to : - Impaired balance/gait    Vitals:   01/09/17 1104  BP: 122/66  Pulse: 92  Resp: 20  Temp: 98.1 F (36.7 C)  SpO2: 91%  Weight: 116 lb 3.2 oz (52.7 kg)  Height: 5\' 2"  (1.575 m)   Body mass index is 21.25 kg/m. Physical Exam  Constitutional: She appears well-developed.  Pleasantly confused elderly in no acute distress   HENT:  Head: Normocephalic.  Right Ear: External ear normal.  Left Ear: External ear normal.  Mouth/Throat: Oropharynx is clear and moist. No oropharyngeal exudate.  Eyes: Pupils are equal, round, and reactive to light. Conjunctivae and EOM are normal. Right eye exhibits no discharge. Left eye exhibits no discharge. No scleral icterus.  Neck: No JVD present. No thyromegaly present.  Cardiovascular: Normal rate and intact distal pulses.  Exam reveals no gallop and no friction rub.   Pulmonary/Chest: Effort normal and breath sounds normal. No respiratory distress. She has no wheezes. She has no rales.  Abdominal: Soft. Bowel sounds are normal. She exhibits no distension. There is no tenderness. There is no rebound and no guarding.  Musculoskeletal:  Moves x 4 extremities. Self propel on wheelchair.Bilateral lower extremities 1+ edema.   Lymphadenopathy:    She has no cervical adenopathy.  Neurological: Coordination normal.  Pleasantly confused at her baseline.  Skin: Skin is warm and dry. No rash noted. No erythema. No pallor.  1. Right shin area skin tear progressive healing without any signs of infection.   2. Left heel pressure ulcer stable in size. Wound bed 80% yellow slough and rest of the ulcer red. Surrounding skin tissues without any signs of infection. No drainage  noted.    Psychiatric: She has a normal mood and affect.   Labs reviewed:  Recent Labs  09/18/16 2232 09/18/16 2239 09/26/16 11/04/16 12/12/16  NA 137 136 139 142 139  K 4.6 4.5 4.5 3.6 4.0  CL 99* 96*  --   --  103  CO2 33*  --   --   --  28  GLUCOSE 115* 110*  --   --   --   BUN 24* 30* 16 14 24*  CREATININE 1.06* 1.00 0.9 0.9 0.93  CALCIUM 9.4  --   --   --  9.4    Recent Labs  05/16/16 12/12/16  AST 16 17  ALT 7 8  ALKPHOS 70 79  BILITOT  --  0.5  PROT  --  6.6  ALBUMIN  --  3.2    Recent Labs  05/16/16 09/18/16 2232 09/18/16 2239 09/26/16 12/12/16  WBC 6.9 11.9*  --  10.5 9.8  NEUTROABS  --  8.4*  --   --   --   HGB 14.3 13.0 13.3 13.2 13.6  HCT 44 39.5 39.0 40 42  MCV  --  99.7  --   --   --   PLT 260 358  --  447*  --    Lab Results  Component Value Date   TSH 1.67 11/07/2016   Lab Results  Component Value Date   HGBA1C 5.8 12/16/2016   Lab Results  Component Value Date   CHOL 174 12/16/2016   HDL 68 12/16/2016   LDLCALC 80 12/16/2016   TRIG 159 12/16/2016    Significant Diagnostic Results in last 30 days:  No results found.  Assessment/Plan 1. Essential hypertension B/p and HR stable.continue on metoprolol  12.5 mg tablet twice daily and furosemide 20 mg tablet daily. Continue to monitor.off ASA due to high risk for falls.   2. Abnormal weight loss 12/11/2016 wt 123 lbs  12/18/2016 wt 121.5 lbs 12/25/2016 wt 122 lbs 01/01/2017 wt 117.9 lbs  01/08/2017 wt 116.5 lbs Per Nurse patient not eating well just 25-50 % of meals but drinks her protein supplement 75-100 %. Will increase Remeron to 15 mg tablet at bedtime. Continue to encourage oral intake,hydration and monitor weight.    3. Slow transit constipation Senokot-s effective. Continue to encourage oral intake and hydration.   4. Gastroesophageal reflux disease without esophagitis Stable. Continue on zantac 150 mg tablet at bedtime.   5. Alzheimer's dementia with behavioral  disturbance Tends to yell for help without any need for help usually by 2 Pm. No aggressive behavior or increased confusion reported.continue to non-pharmacological measure for now. Will consider adding Seroquel at 1 Pm if yelling behavior worsening.continue on Namenda 10 mg tablet twice daily.assist with ADL's as needed.       6. Vitamin D deficiency Continue on Calcium and vit D supplements. d/c vit D 1000 mg capsule lab results within normal limit and continue with calcium-vit D 600-400 mg tablet daily. Monitor vit d level.   7. Pressure ulcer left heel stage 2  Afebrile.ulcer stable in size.Wound bed 80% yellow slough and rest of the ulcer red. Surrounding skin tissues without any signs of infection. No drainage noted.    8. Right shin area skin tear  progressive healing without any signs of infection.continue current dressing changes and monitor.   Family/ staff Communication: Reviewed plan of care with patient and facility Nurse.   Labs/tests ordered:  Vit D level with next lab   Sandrea Hughs, NP

## 2017-01-10 DIAGNOSIS — M5134 Other intervertebral disc degeneration, thoracic region: Secondary | ICD-10-CM | POA: Diagnosis not present

## 2017-01-10 DIAGNOSIS — R2681 Unsteadiness on feet: Secondary | ICD-10-CM | POA: Diagnosis not present

## 2017-01-10 DIAGNOSIS — R296 Repeated falls: Secondary | ICD-10-CM | POA: Diagnosis not present

## 2017-01-10 DIAGNOSIS — M6281 Muscle weakness (generalized): Secondary | ICD-10-CM | POA: Diagnosis not present

## 2017-01-10 DIAGNOSIS — N814 Uterovaginal prolapse, unspecified: Secondary | ICD-10-CM | POA: Diagnosis not present

## 2017-01-10 DIAGNOSIS — M4800 Spinal stenosis, site unspecified: Secondary | ICD-10-CM | POA: Diagnosis not present

## 2017-01-14 DIAGNOSIS — N814 Uterovaginal prolapse, unspecified: Secondary | ICD-10-CM | POA: Diagnosis not present

## 2017-01-14 DIAGNOSIS — M5134 Other intervertebral disc degeneration, thoracic region: Secondary | ICD-10-CM | POA: Diagnosis not present

## 2017-01-14 DIAGNOSIS — M6281 Muscle weakness (generalized): Secondary | ICD-10-CM | POA: Diagnosis not present

## 2017-01-14 DIAGNOSIS — M4800 Spinal stenosis, site unspecified: Secondary | ICD-10-CM | POA: Diagnosis not present

## 2017-01-14 DIAGNOSIS — R2681 Unsteadiness on feet: Secondary | ICD-10-CM | POA: Diagnosis not present

## 2017-01-14 DIAGNOSIS — R296 Repeated falls: Secondary | ICD-10-CM | POA: Diagnosis not present

## 2017-01-15 ENCOUNTER — Encounter: Payer: Self-pay | Admitting: Family

## 2017-01-15 ENCOUNTER — Non-Acute Institutional Stay (SKILLED_NURSING_FACILITY): Payer: Medicare Other | Admitting: Family

## 2017-01-15 DIAGNOSIS — F411 Generalized anxiety disorder: Secondary | ICD-10-CM | POA: Diagnosis not present

## 2017-01-15 DIAGNOSIS — F0281 Dementia in other diseases classified elsewhere with behavioral disturbance: Secondary | ICD-10-CM

## 2017-01-15 DIAGNOSIS — G309 Alzheimer's disease, unspecified: Secondary | ICD-10-CM | POA: Diagnosis not present

## 2017-01-15 MED ORDER — QUETIAPINE FUMARATE 25 MG PO TABS: 25.0000 mg | ORAL_TABLET | Freq: Every day | ORAL | Status: DC

## 2017-01-15 NOTE — Progress Notes (Signed)
Location:  [facility] Room Number: 32 Place of Service:  SNF (31) Provider: Shianne Zeiser FNP-C  Blanchie Serve, MD  Patient Care Team: Blanchie Serve, MD as PCP - General (Internal Medicine) Jettie Booze, MD as Consulting Physician (Cardiology) Marchia Bond, MD as Consulting Physician (Orthopedic Surgery) Kathrynn Ducking, MD as Consulting Physician (Neurology) Brand Males, MD as Consulting Physician (Pulmonary Disease) Antwoine Zorn, Nelda Bucks, NP as Nurse Practitioner (Family Medicine)  Extended Emergency Contact Information Primary Emergency Contact: [place] of [place] Phone: [phone] Relation: Son Secondary Emergency Contact: Ricardo Jericho [place] of [place] Phone: [phone] Mobile Phone: [phone] Relation: Daughter  Code Status:  DNR Goals of care: Advanced Directive information Advanced Directives 01/15/2017  Does Patient Have a Medical Advance Directive? Yes  Type of Paramedic of [place];Living will;Out of facility DNR (pink MOST or yellow form)  Does patient want to make changes to medical advance directive? No - Patient declined  Copy of [place] in Chart? Yes  Pre-existing out of facility DNR order (yellow form or pink MOST form) Yellow form placed in chart (order not valid for inpatient use)     Chief Complaint  Patient presents with  . Acute Visit    Increased agitation/anxiety    HPI:  Pt is a 83 y.o. female seen today at [facility] for an acute visit for evaluation of increased agitation and anxiety.She is seen in her room today per facility Nurse request. Nurse reports patient has had increased anxiety looking for her car keys. Patient states she is trying to get home before it's dark.Her agitation seems to get worst after 2 Pm.She yells out for help even when staff is present but will states doesn't need assistance.she is  able to verbalize needs majority of the time.She denies any fever, chills, cough or signs of urinary tract infections.Facility staff have attempted to engage in activities and distract her behavior without any success.   Past Medical History:  Diagnosis Date  . Alzheimer's disease 06/22/2015  . Anemia    iron deficient  . Anemia   . Anxiety   . Chronic airway obstruction, not elsewhere classified   . Chronic back pain   . Chronic cystitis   . Cystitis, chronic    Dr Gaynelle Arabian  . Degenerative arthritis   . Depression   . Disturbance of skin sensation   . Disturbance of skin sensation   . Dyslipidemia   . Dyspnea    Dr Chase Caller  . Esophageal reflux   . Essential and other specified forms of tremor 12/11/2012  . Foot drop, bilateral 12/21/2013  . Gait disorder   . GERD (gastroesophageal reflux disease)   . Hemorrhoids   . Hereditary and idiopathic peripheral neuropathy 10/11/2015  . Hernia   . History of cerebrovascular disease    Moderate level small vessel disease  . Hyperlipidemia    mild  . Hypertension   . Osteoarthritis    right knee injection per Dr Trudie Reed  . Osteoporosis   . Other abnormal clinical finding   . Pain in joint, upper arm   . Peripheral vascular disease ([place])   . Rheumatoid arthritis ([place])    Dr Trudie Reed  . Tobacco use disorder   . Tremor    and gait disorder--Dr Jannifer Franklin  . Unspecified vitamin D deficiency    Past Surgical History:  Procedure Laterality Date  . APPENDECTOMY    . BACK SURGERY Bilateral  X2  . Bladder resuspension procedure    . CATARACT EXTRACTION Bilateral   . FEMUR IM NAIL Right 02/22/2014   Procedure: INTRAMEDULLARY (IM) NAIL FEMORAL;  Surgeon: Johnny Bridge, MD;  Location: [facility];  Service: Orthopedics;  Laterality: Right;  . INGUINAL HERNIA REPAIR Bilateral   . ORIF ANKLE FRACTURE Left 09/19/2016   Procedure: OPEN REDUCTION INTERNAL FIXATION (ORIF) ANKLE FRACTURE;  Surgeon: Vickey Huger, MD;  Location: [facility];  Service:  Orthopedics;  Laterality: Left;  . TONSILLECTOMY      Allergies  Allergen Reactions  . Novocain [Procaine]     Passed  out  . Limbrel [Flavocoxid]     Dizziness   . Sertraline Anxiety    Outpatient Encounter Prescriptions as of 01/15/2017  Medication Sig  . acetaminophen (TYLENOL) 650 MG CR tablet Take 650 mg by mouth 2 (two) times daily.   Marland Kitchen alum & mag hydroxide-simeth (MAALOX/MYLANTA) 200-200-20 MG/5ML suspension Take 30 mLs by mouth every 4 (four) hours as needed for indigestion or heartburn.  . Amino Acids-Protein Hydrolys (FEEDING SUPPLEMENT, PRO-STAT SUGAR FREE 64,) LIQD Take 30 mLs by mouth daily.  . Calcium Carbonate-Vitamin D (CALTRATE 600+D) 600-400 MG-UNIT per tablet Take 1 tablet by mouth daily.  . collagenase (SANTYL) ointment Apply 1 application topically every 3 (three) days.  . furosemide (LASIX) 20 MG tablet Take 20 mg by mouth daily.  Marland Kitchen Hyoscyamine Sulfate SL (LEVSIN/SL) 0.125 MG SUBL Place under the tongue every 12 (twelve) hours as needed (for cramps).   . memantine (NAMENDA) 10 MG tablet Take 10 mg by mouth 2 (two) times daily.  . metoprolol tartrate (LOPRESSOR) 25 MG tablet Take 12.5 mg by mouth 2 (two) times daily. Hold for SBP <110 or HR <60/min  . mineral oil-hydrophilic petrolatum (AQUAPHOR) ointment Apply 1 application topically 2 (two) times daily.  . mirtazapine (REMERON) 15 MG tablet Take 15 mg by mouth at bedtime.  . Misc Natural Products (OSTEO BI-FLEX TRIPLE STRENGTH) TABS Take 1 tablet by mouth daily.  . Multiple Vitamin (MULTIVITAMIN) tablet Take 1 tablet by mouth daily.  . Nutritional Supplements (RESOURCE 2.0) LIQD Take 120 mLs by mouth 3 (three) times daily.   Marland Kitchen omeprazole (PRILOSEC) 20 MG capsule Take 20 mg by mouth daily.  . ondansetron (ZOFRAN) 4 MG tablet Take 4 mg by mouth every 6 (six) hours as needed for nausea or vomiting.  . ranitidine (ZANTAC) 150 MG tablet Take 150 mg by mouth at bedtime.   . sennosides-docusate sodium (SENOKOT-S)  8.6-50 MG tablet Take 2 tablets by mouth daily.  . QUEtiapine (SEROQUEL) 25 MG tablet Take 1 tablet (25 mg total) by mouth daily. At 2 Pm  . [DISCONTINUED] Cholecalciferol (VITAMIN D3) 1000 units CAPS Take 1 capsule by mouth daily.  . [DISCONTINUED] magnesium hydroxide (MILK OF MAGNESIA) 400 MG/5ML suspension Take 30 mLs by mouth as needed for mild constipation.  . [DISCONTINUED] mirtazapine (REMERON) 7.5 MG tablet Take 1 tablet (7.5 mg total) by mouth at bedtime.   No facility-administered encounter medications on file as of 01/15/2017.     Review of Systems  Constitutional: Negative for activity change, chills, fatigue, fever and unexpected weight change.  HENT: Negative for congestion, rhinorrhea, sinus pain, sinus pressure, sneezing and sore throat.   Eyes: Negative for discharge and redness.  Respiratory: Negative for cough, chest tightness, shortness of breath and wheezing.   Cardiovascular: Negative for chest pain and palpitations.  Gastrointestinal: Negative for abdominal distention, abdominal pain, constipation, diarrhea, nausea and vomiting.  Endocrine:  Negative for cold intolerance and heat intolerance.  Genitourinary: Negative for dysuria, flank pain, frequency and urgency.  Musculoskeletal: Positive for gait problem.  Skin: Negative for color change, pallor and rash.       Left heel ulcer and right shin skin tear.   Neurological: Negative for dizziness, seizures, syncope, light-headedness, numbness and headaches.  Psychiatric/Behavioral: Positive for agitation and confusion. Negative for sleep disturbance. The patient is nervous/anxious.     Immunization History  Administered Date(s) Administered  . Influenza Whole 01/16/2010  . Influenza-Unspecified 02/03/2006, 01/09/2007, 01/05/2013, 12/22/2014, 12/28/2015, 12/26/2016  . PPD Test 05/01/2004, 02/24/2014  . Pneumococcal Polysaccharide-23 01/21/2006  . Pneumococcal-Unspecified 12/19/1998  . Td 03/18/1998  . Tdap  09/18/2016   Pertinent  Health Maintenance Due  Topic Date Due  . PNA vac Low Risk Adult (2 of 2 - PCV13) 03/18/2017 (Originally 01/22/2007)  . INFLUENZA VACCINE  Completed  . DEXA SCAN  Completed   Fall Risk  10/28/2016 06/20/2015  Falls in the past year? No No  Risk for fall due to : - Impaired balance/gait    Vitals:   01/15/17 1520  BP: 128/76  Pulse: 78  Resp: 20  Temp: 97.8 F (36.6 C)  TempSrc: Oral  SpO2: 94%  Weight: 117 lb 3.2 oz (53.2 kg)  Height: 5\' 1"  (1.549 m)   Body mass index is 22.14 kg/m. Physical Exam  Constitutional: She appears well-developed and well-nourished.  Elderly in no acute distress   HENT:  Head: Normocephalic.  Right Ear: External ear normal.  Left Ear: External ear normal.  Mouth/Throat: Oropharynx is clear and moist. No oropharyngeal exudate.  Eyes: Pupils are equal, round, and reactive to light. Conjunctivae and EOM are normal. Right eye exhibits no discharge. Left eye exhibits no discharge. No scleral icterus.  Neck: Normal range of motion. No JVD present. No thyromegaly present.  Cardiovascular: Normal rate, regular rhythm, normal heart sounds and intact distal pulses.  Exam reveals no gallop and no friction rub.   No murmur heard. Pulmonary/Chest: Effort normal and breath sounds normal. No respiratory distress. She has no wheezes. She has no rales.  Abdominal: Soft. Bowel sounds are normal. She exhibits no distension. There is no tenderness. There is no rebound and no guarding.  Lymphadenopathy:    She has no cervical adenopathy.  Neurological: Coordination normal.  Alert and oriented to person and city but disoriented to year, date and time.   Skin: Skin is warm and dry. No rash noted. No pallor.  Left heel ulcer; 85% wound bed red in color with mid yellow tissues noted. Surrounding skin tissue without any signs of infections.   Psychiatric:  Anxious during visit and yelling out for help during assessment without any need for  anything.    Labs reviewed:  Recent Labs  09/18/16 2232 09/18/16 2239 09/26/16 11/04/16 12/12/16  NA 137 136 139 142 139  K 4.6 4.5 4.5 3.6 4.0  CL 99* 96*  --   --  103  CO2 33*  --   --   --  28  GLUCOSE 115* 110*  --   --   --   BUN 24* 30* 16 14 24*  CREATININE 1.06* 1.00 0.9 0.9 0.93  CALCIUM 9.4  --   --   --  9.4    Recent Labs  05/16/16 12/12/16  AST 16 17  ALT 7 8  ALKPHOS 70 79  BILITOT  --  0.5  PROT  --  6.6  ALBUMIN  --  3.2    Recent Labs  05/16/16 09/18/16 2232 09/18/16 2239 09/26/16 12/12/16  WBC 6.9 11.9*  --  10.5 9.8  NEUTROABS  --  8.4*  --   --   --   HGB 14.3 13.0 13.3 13.2 13.6  HCT 44 39.5 39.0 40 42  MCV  --  99.7  --   --   --   PLT 260 358  --  447*  --    Lab Results  Component Value Date   TSH 1.67 11/07/2016   Lab Results  Component Value Date   HGBA1C 5.8 12/16/2016   Lab Results  Component Value Date   CHOL 174 12/16/2016   HDL 68 12/16/2016   LDLCALC 80 12/16/2016   TRIG 159 12/16/2016    Significant Diagnostic Results in last 30 days:  No results found.  Assessment/Plan 1. Alzheimer's dementia with behavioral disturbance Has had increased behavioral issues yelling out loudly for help even when she does not need any assistance.Also looking for her car keys, wanting to go home to see her mother and sometimes her grandparents.distractions and engaging in activities has not been successful. Behaviors seems to be worst after 2 Pm which seems to be sundowning.Will start on Seroquel 25 mg tablet daily at 2 Pm. Continue on Namenda 10 mg tablet twice daily.continue to encourage participation in activities and Monitor.     2. GAD (generalized anxiety disorder) Dementia related. Will avoid Benzo's due to increased risk for falls.could benefit from low dose SSRI but patient is allergic to sertraline.Start Seroquel as above. Continue to monitor.   Family/ staff Communication: Reviewed plan of care with patient and facility Nurse.     Labs/tests ordered: None   Marchelle Rinella C Mindy Gali, NP

## 2017-01-16 DIAGNOSIS — R296 Repeated falls: Secondary | ICD-10-CM | POA: Diagnosis not present

## 2017-01-16 DIAGNOSIS — R2681 Unsteadiness on feet: Secondary | ICD-10-CM | POA: Diagnosis not present

## 2017-01-16 DIAGNOSIS — Z4889 Encounter for other specified surgical aftercare: Secondary | ICD-10-CM | POA: Diagnosis not present

## 2017-01-16 DIAGNOSIS — M5134 Other intervertebral disc degeneration, thoracic region: Secondary | ICD-10-CM | POA: Diagnosis not present

## 2017-01-16 DIAGNOSIS — R2990 Unspecified symptoms and signs involving the nervous system: Secondary | ICD-10-CM | POA: Diagnosis not present

## 2017-01-16 DIAGNOSIS — R270 Ataxia, unspecified: Secondary | ICD-10-CM | POA: Diagnosis not present

## 2017-01-16 DIAGNOSIS — R279 Unspecified lack of coordination: Secondary | ICD-10-CM | POA: Diagnosis not present

## 2017-01-16 DIAGNOSIS — N814 Uterovaginal prolapse, unspecified: Secondary | ICD-10-CM | POA: Diagnosis not present

## 2017-01-16 DIAGNOSIS — R0600 Dyspnea, unspecified: Secondary | ICD-10-CM | POA: Diagnosis not present

## 2017-01-16 DIAGNOSIS — R262 Difficulty in walking, not elsewhere classified: Secondary | ICD-10-CM | POA: Diagnosis not present

## 2017-01-16 DIAGNOSIS — Z9181 History of falling: Secondary | ICD-10-CM | POA: Diagnosis not present

## 2017-01-16 DIAGNOSIS — M6281 Muscle weakness (generalized): Secondary | ICD-10-CM | POA: Diagnosis not present

## 2017-01-17 DIAGNOSIS — R0600 Dyspnea, unspecified: Secondary | ICD-10-CM | POA: Diagnosis not present

## 2017-01-17 DIAGNOSIS — R2681 Unsteadiness on feet: Secondary | ICD-10-CM | POA: Diagnosis not present

## 2017-01-17 DIAGNOSIS — N814 Uterovaginal prolapse, unspecified: Secondary | ICD-10-CM | POA: Diagnosis not present

## 2017-01-17 DIAGNOSIS — M5134 Other intervertebral disc degeneration, thoracic region: Secondary | ICD-10-CM | POA: Diagnosis not present

## 2017-01-17 DIAGNOSIS — R296 Repeated falls: Secondary | ICD-10-CM | POA: Diagnosis not present

## 2017-01-17 DIAGNOSIS — M6281 Muscle weakness (generalized): Secondary | ICD-10-CM | POA: Diagnosis not present

## 2017-01-20 ENCOUNTER — Non-Acute Institutional Stay (SKILLED_NURSING_FACILITY): Payer: Medicare Other | Admitting: Family

## 2017-01-20 ENCOUNTER — Encounter: Payer: Self-pay | Admitting: Family

## 2017-01-20 DIAGNOSIS — S81812A Laceration without foreign body, left lower leg, initial encounter: Secondary | ICD-10-CM

## 2017-01-20 DIAGNOSIS — L89622 Pressure ulcer of left heel, stage 2: Secondary | ICD-10-CM | POA: Diagnosis not present

## 2017-01-20 DIAGNOSIS — S81811D Laceration without foreign body, right lower leg, subsequent encounter: Secondary | ICD-10-CM

## 2017-01-20 NOTE — Progress Notes (Addendum)
Location:  [facility] Room Number: 32 Place of Service:  SNF (31) Provider: Curties Conigliaro FNP-C  Blanchie Serve, MD  Patient Care Team: Blanchie Serve, MD as PCP - General (Internal Medicine) Jettie Booze, MD as Consulting Physician (Cardiology) Marchia Bond, MD as Consulting Physician (Orthopedic Surgery) Kathrynn Ducking, MD as Consulting Physician (Neurology) Brand Males, MD as Consulting Physician (Pulmonary Disease) Ellamay Fors, Nelda Bucks, NP as Nurse Practitioner (Family Medicine)  Extended Emergency Contact Information Primary Emergency Contact: [place] of [place] Phone: [phone] Relation: Son Secondary Emergency Contact: Ricardo Jericho [place] of [place] Phone: [phone] Mobile Phone: [phone] Relation: Daughter  Code Status:  DNR Goals of care: Advanced Directive information Advanced Directives 01/20/2017  Does Patient Have a Medical Advance Directive? Yes  Type of Advance Directive Out of facility DNR (pink MOST or yellow form);Living will;Healthcare Power of Attorney  Does patient want to make changes to medical advance directive? -  Copy of [name] in Chart? Yes  Pre-existing out of facility DNR order (yellow form or pink MOST form) Yellow form placed in chart (order not valid for inpatient use)     Chief Complaint  Patient presents with  . Acute Visit    recheck leg wound-staff noted hypergranulation ? change dressing    HPI:  Pt is a 83 y.o. female seen today at [facility] for an acute visit for evaluation of right leg previous skin tear site. She is seen in her room today. She denies any new acute issues this visit. Facility Nurse states previous right upper shin hypergranulation . No fever or chills reported.    Past Medical History:  Diagnosis Date  . Alzheimer's disease 06/22/2015  . Anemia    iron deficient  . Anemia   . Anxiety   .  Chronic airway obstruction, not elsewhere classified   . Chronic back pain   . Chronic cystitis   . Cystitis, chronic    Dr Gaynelle Arabian  . Degenerative arthritis   . Depression   . Disturbance of skin sensation   . Disturbance of skin sensation   . Dyslipidemia   . Dyspnea    Dr Chase Caller  . Esophageal reflux   . Essential and other specified forms of tremor 12/11/2012  . Foot drop, bilateral 12/21/2013  . Gait disorder   . GERD (gastroesophageal reflux disease)   . Hemorrhoids   . Hereditary and idiopathic peripheral neuropathy 10/11/2015  . Hernia   . History of cerebrovascular disease    Moderate level small vessel disease  . Hyperlipidemia    mild  . Hypertension   . Osteoarthritis    right knee injection per Dr Trudie Reed  . Osteoporosis   . Other abnormal clinical finding   . Pain in joint, upper arm   . Peripheral vascular disease ([place])   . Rheumatoid arthritis ([place])    Dr Trudie Reed  . Tobacco use disorder   . Tremor    and gait disorder--Dr Jannifer Franklin  . Unspecified vitamin D deficiency    Past Surgical History:  Procedure Laterality Date  . APPENDECTOMY    . BACK SURGERY Bilateral    X2  . Bladder resuspension procedure    . CATARACT EXTRACTION Bilateral   . INGUINAL HERNIA REPAIR Bilateral   . TONSILLECTOMY      Allergies  Allergen Reactions  . Novocain [Procaine]     Passed  out  . Limbrel [Flavocoxid]     Dizziness   .  Sertraline Anxiety    Outpatient Encounter Medications as of 01/20/2017  Medication Sig  . acetaminophen (TYLENOL) 650 MG CR tablet Take 650 mg by mouth 2 (two) times daily.   Marland Kitchen alum & mag hydroxide-simeth (MAALOX/MYLANTA) 200-200-20 MG/5ML suspension Take 30 mLs by mouth every 4 (four) hours as needed for indigestion or heartburn.  . Amino Acids-Protein Hydrolys (FEEDING SUPPLEMENT, PRO-STAT SUGAR FREE 64,) LIQD Take 30 mLs by mouth daily.  . Calcium Carbonate-Vitamin D (CALTRATE 600+D) 600-400 MG-UNIT per tablet Take 1 tablet by mouth  daily.  . collagenase (SANTYL) ointment Apply 1 application topically every 3 (three) days.  . furosemide (LASIX) 20 MG tablet Take 20 mg by mouth daily.  Marland Kitchen Hyoscyamine Sulfate SL (LEVSIN/SL) 0.125 MG SUBL Place under the tongue every 12 (twelve) hours as needed (for cramps).   . memantine (NAMENDA) 10 MG tablet Take 10 mg by mouth 2 (two) times daily.  . metoprolol tartrate (LOPRESSOR) 25 MG tablet Take 12.5 mg by mouth 2 (two) times daily. Hold for SBP <110 or HR <60/min  . mineral oil-hydrophilic petrolatum (AQUAPHOR) ointment Apply 1 application topically 2 (two) times daily.  . mirtazapine (REMERON) 15 MG tablet Take 15 mg by mouth at bedtime.  . Misc Natural Products (OSTEO BI-FLEX TRIPLE STRENGTH) TABS Take 1 tablet by mouth daily.  . Multiple Vitamin (MULTIVITAMIN) tablet Take 1 tablet by mouth daily.  . Nutritional Supplements (RESOURCE 2.0) LIQD Take 120 mLs by mouth 3 (three) times daily.   Marland Kitchen omeprazole (PRILOSEC) 20 MG capsule Take 20 mg by mouth daily.  . ondansetron (ZOFRAN) 4 MG tablet Take 4 mg by mouth every 6 (six) hours as needed for nausea or vomiting.  Marland Kitchen QUEtiapine (SEROQUEL) 25 MG tablet Take 1 tablet (25 mg total) by mouth daily. At 2 Pm  . ranitidine (ZANTAC) 150 MG tablet Take 150 mg by mouth at bedtime.   . sennosides-docusate sodium (SENOKOT-S) 8.6-50 MG tablet Take 2 tablets by mouth daily.   No facility-administered encounter medications on file as of 01/20/2017.     Review of Systems  Constitutional: Negative for activity change, chills, fatigue and fever.  Respiratory: Negative for cough, chest tightness and wheezing.   Cardiovascular: Positive for leg swelling. Negative for chest pain and palpitations.  Genitourinary: Negative for dysuria, frequency and urgency.  Musculoskeletal: Positive for gait problem.  Skin: Negative for pallor and rash.       Chronic leg dermatitis   Hematological: Bruises/bleeds easily.  Psychiatric/Behavioral: Positive for  confusion. Negative for agitation. The patient is not nervous/anxious.     Immunization History  Administered Date(s) Administered  . Influenza Whole 01/16/2010  . Influenza-Unspecified 02/03/2006, 01/09/2007, 01/05/2013, 12/22/2014, 12/28/2015, 12/26/2016  . PPD Test 05/01/2004, 02/24/2014  . Pneumococcal Polysaccharide-23 01/21/2006  . Pneumococcal-Unspecified 12/19/1998  . Td 03/18/1998  . Tdap 09/18/2016   Pertinent  Health Maintenance Due  Topic Date Due  . PNA vac Low Risk Adult (2 of 2 - PCV13) 03/18/2017 (Originally 01/22/2007)  . INFLUENZA VACCINE  Completed  . DEXA SCAN  Completed   Fall Risk  10/28/2016 06/20/2015  Falls in the past year? No No  Risk for fall due to : - Impaired balance/gait    Vitals:   01/20/17 1050  BP: 120/70  Pulse: 70  Resp: 20  Temp: 98.8 F (37.1 C)  SpO2: 94%  Weight: 117 lb 3.2 oz (53.2 kg)  Height: 5\' 1"  (1.549 m)   Body mass index is 22.14 kg/m. Physical Exam  Constitutional: She  appears well-developed and well-nourished.  Pleasantly confused elderly at her baseline   HENT:  Head: Normocephalic.  Mouth/Throat: Oropharynx is clear and moist. No oropharyngeal exudate.  Eyes: Conjunctivae and EOM are normal. Pupils are equal, round, and reactive to light. Right eye exhibits no discharge. Left eye exhibits no discharge. No scleral icterus.  Cardiovascular: Normal rate, regular rhythm, normal heart sounds and intact distal pulses. Exam reveals no gallop and no friction rub.  No murmur heard. Pulmonary/Chest: Effort normal and breath sounds normal. No respiratory distress. She has no wheezes. She has no rales.  Abdominal: Soft. Bowel sounds are normal. She exhibits no distension. There is no tenderness. There is no rebound.  Musculoskeletal:  Moves x 4 extremities.self propels on wheelchair.bilateral lower extremities trace-1+ edema.    Neurological: Coordination normal.  Alert and oriented to person and place.  Skin: Skin is warm and  dry. No rash noted. No erythema.  1. Right upper shin area previous healed difficult to discern due to chronic leg dermatitis.   2. Left shin skin tear with dry blood no signs of infections on surrounding skin tissue.   3. Left heel pressure ulcer; Progressive healing wound bed with 10 % yellow tissue.No drainage noted.     Psychiatric: She has a normal mood and affect.   Labs reviewed: Recent Labs    09/18/16 2232 09/18/16 2239 09/26/16 11/04/16 12/12/16  NA 137 136 139 142 139  K 4.6 4.5 4.5 3.6 4.0  CL 99* 96*  --   --  103  CO2 33*  --   --   --  28  GLUCOSE 115* 110*  --   --   --   BUN 24* 30* 16 14 24*  CREATININE 1.06* 1.00 0.9 0.9 0.93  CALCIUM 9.4  --   --   --  9.4   Recent Labs    05/16/16 12/12/16  AST 16 17  ALT 7 8  ALKPHOS 70 79  BILITOT  --  0.5  PROT  --  6.6  ALBUMIN  --  3.2   Recent Labs    05/16/16 09/18/16 2232 09/18/16 2239 09/26/16 12/12/16  WBC 6.9 11.9*  --  10.5 9.8  NEUTROABS  --  8.4*  --   --   --   HGB 14.3 13.0 13.3 13.2 13.6  HCT 44 39.5 39.0 40 42  MCV  --  99.7  --   --   --   PLT 260 358  --  447*  --    Lab Results  Component Value Date   TSH 1.67 11/07/2016   Lab Results  Component Value Date   HGBA1C 5.8 12/16/2016   Lab Results  Component Value Date   CHOL 174 12/16/2016   HDL 68 12/16/2016   LDLCALC 80 12/16/2016   TRIG 159 12/16/2016    Significant Diagnostic Results in last 30 days:  No results found.  Assessment/Plan 1. Noninfected skin tear of left lower extremity, initial encounter Afebrile.Left shin skin tear with dry blood no signs of infections on surrounding skin tissue.clean skin tear with saline, pat dry and cover with non-adhesive gauze.Wrap leg with Kerlix from toes to below the knee due to edema. Avoid use of coban to prevent tightness.   2. Pressure ulcer of left heel, stage 2   Progressive healing wound bed with 10 % yellow tissue.No drainage noted.continue cleanse wound bed with saline, pat  dry, santyl to yellow skin tissue and cover with foam dressing.Kerlix wrap  as above.    3.Right upper shin area  previous skin tear area healed difficult to discern due to chronic leg dermatitis.  Continue to monitor.   Family/ staff Communication: Reviewed plan of care with patient and facility Nurse.   Labs/tests ordered: None   Marra Fraga C Kiora Hallberg, NP

## 2017-01-21 DIAGNOSIS — R0600 Dyspnea, unspecified: Secondary | ICD-10-CM | POA: Diagnosis not present

## 2017-01-21 DIAGNOSIS — M5134 Other intervertebral disc degeneration, thoracic region: Secondary | ICD-10-CM | POA: Diagnosis not present

## 2017-01-21 DIAGNOSIS — N814 Uterovaginal prolapse, unspecified: Secondary | ICD-10-CM | POA: Diagnosis not present

## 2017-01-21 DIAGNOSIS — R2681 Unsteadiness on feet: Secondary | ICD-10-CM | POA: Diagnosis not present

## 2017-01-21 DIAGNOSIS — R296 Repeated falls: Secondary | ICD-10-CM | POA: Diagnosis not present

## 2017-01-21 DIAGNOSIS — M6281 Muscle weakness (generalized): Secondary | ICD-10-CM | POA: Diagnosis not present

## 2017-01-23 DIAGNOSIS — R296 Repeated falls: Secondary | ICD-10-CM | POA: Diagnosis not present

## 2017-01-23 DIAGNOSIS — R2681 Unsteadiness on feet: Secondary | ICD-10-CM | POA: Diagnosis not present

## 2017-01-23 DIAGNOSIS — N814 Uterovaginal prolapse, unspecified: Secondary | ICD-10-CM | POA: Diagnosis not present

## 2017-01-23 DIAGNOSIS — R0600 Dyspnea, unspecified: Secondary | ICD-10-CM | POA: Diagnosis not present

## 2017-01-23 DIAGNOSIS — M6281 Muscle weakness (generalized): Secondary | ICD-10-CM | POA: Diagnosis not present

## 2017-01-23 DIAGNOSIS — M5134 Other intervertebral disc degeneration, thoracic region: Secondary | ICD-10-CM | POA: Diagnosis not present

## 2017-01-24 DIAGNOSIS — N814 Uterovaginal prolapse, unspecified: Secondary | ICD-10-CM | POA: Diagnosis not present

## 2017-01-24 DIAGNOSIS — M5134 Other intervertebral disc degeneration, thoracic region: Secondary | ICD-10-CM | POA: Diagnosis not present

## 2017-01-24 DIAGNOSIS — R2681 Unsteadiness on feet: Secondary | ICD-10-CM | POA: Diagnosis not present

## 2017-01-24 DIAGNOSIS — M6281 Muscle weakness (generalized): Secondary | ICD-10-CM | POA: Diagnosis not present

## 2017-01-24 DIAGNOSIS — R296 Repeated falls: Secondary | ICD-10-CM | POA: Diagnosis not present

## 2017-01-24 DIAGNOSIS — R0600 Dyspnea, unspecified: Secondary | ICD-10-CM | POA: Diagnosis not present

## 2017-01-28 DIAGNOSIS — M5134 Other intervertebral disc degeneration, thoracic region: Secondary | ICD-10-CM | POA: Diagnosis not present

## 2017-01-28 DIAGNOSIS — R2681 Unsteadiness on feet: Secondary | ICD-10-CM | POA: Diagnosis not present

## 2017-01-28 DIAGNOSIS — N814 Uterovaginal prolapse, unspecified: Secondary | ICD-10-CM | POA: Diagnosis not present

## 2017-01-28 DIAGNOSIS — R296 Repeated falls: Secondary | ICD-10-CM | POA: Diagnosis not present

## 2017-01-28 DIAGNOSIS — R0600 Dyspnea, unspecified: Secondary | ICD-10-CM | POA: Diagnosis not present

## 2017-01-28 DIAGNOSIS — M6281 Muscle weakness (generalized): Secondary | ICD-10-CM | POA: Diagnosis not present

## 2017-01-30 DIAGNOSIS — M6281 Muscle weakness (generalized): Secondary | ICD-10-CM | POA: Diagnosis not present

## 2017-01-30 DIAGNOSIS — R0600 Dyspnea, unspecified: Secondary | ICD-10-CM | POA: Diagnosis not present

## 2017-01-30 DIAGNOSIS — M5134 Other intervertebral disc degeneration, thoracic region: Secondary | ICD-10-CM | POA: Diagnosis not present

## 2017-01-30 DIAGNOSIS — N814 Uterovaginal prolapse, unspecified: Secondary | ICD-10-CM | POA: Diagnosis not present

## 2017-01-30 DIAGNOSIS — R2681 Unsteadiness on feet: Secondary | ICD-10-CM | POA: Diagnosis not present

## 2017-01-30 DIAGNOSIS — R296 Repeated falls: Secondary | ICD-10-CM | POA: Diagnosis not present

## 2017-01-31 DIAGNOSIS — R2681 Unsteadiness on feet: Secondary | ICD-10-CM | POA: Diagnosis not present

## 2017-01-31 DIAGNOSIS — R296 Repeated falls: Secondary | ICD-10-CM | POA: Diagnosis not present

## 2017-01-31 DIAGNOSIS — M6281 Muscle weakness (generalized): Secondary | ICD-10-CM | POA: Diagnosis not present

## 2017-01-31 DIAGNOSIS — N814 Uterovaginal prolapse, unspecified: Secondary | ICD-10-CM | POA: Diagnosis not present

## 2017-01-31 DIAGNOSIS — M5134 Other intervertebral disc degeneration, thoracic region: Secondary | ICD-10-CM | POA: Diagnosis not present

## 2017-01-31 DIAGNOSIS — R0600 Dyspnea, unspecified: Secondary | ICD-10-CM | POA: Diagnosis not present

## 2017-02-04 DIAGNOSIS — R2681 Unsteadiness on feet: Secondary | ICD-10-CM | POA: Diagnosis not present

## 2017-02-04 DIAGNOSIS — M6281 Muscle weakness (generalized): Secondary | ICD-10-CM | POA: Diagnosis not present

## 2017-02-04 DIAGNOSIS — R296 Repeated falls: Secondary | ICD-10-CM | POA: Diagnosis not present

## 2017-02-04 DIAGNOSIS — R0600 Dyspnea, unspecified: Secondary | ICD-10-CM | POA: Diagnosis not present

## 2017-02-04 DIAGNOSIS — M5134 Other intervertebral disc degeneration, thoracic region: Secondary | ICD-10-CM | POA: Diagnosis not present

## 2017-02-04 DIAGNOSIS — N814 Uterovaginal prolapse, unspecified: Secondary | ICD-10-CM | POA: Diagnosis not present

## 2017-02-05 ENCOUNTER — Non-Acute Institutional Stay (SKILLED_NURSING_FACILITY): Payer: Medicare Other | Admitting: Family

## 2017-02-05 ENCOUNTER — Encounter: Payer: Self-pay | Admitting: Family

## 2017-02-05 DIAGNOSIS — G309 Alzheimer's disease, unspecified: Secondary | ICD-10-CM | POA: Diagnosis not present

## 2017-02-05 DIAGNOSIS — L89622 Pressure ulcer of left heel, stage 2: Secondary | ICD-10-CM

## 2017-02-05 DIAGNOSIS — M6281 Muscle weakness (generalized): Secondary | ICD-10-CM | POA: Diagnosis not present

## 2017-02-05 DIAGNOSIS — R2681 Unsteadiness on feet: Secondary | ICD-10-CM | POA: Diagnosis not present

## 2017-02-05 DIAGNOSIS — R0600 Dyspnea, unspecified: Secondary | ICD-10-CM | POA: Diagnosis not present

## 2017-02-05 DIAGNOSIS — F0281 Dementia in other diseases classified elsewhere with behavioral disturbance: Secondary | ICD-10-CM

## 2017-02-05 DIAGNOSIS — N814 Uterovaginal prolapse, unspecified: Secondary | ICD-10-CM | POA: Diagnosis not present

## 2017-02-05 DIAGNOSIS — R296 Repeated falls: Secondary | ICD-10-CM | POA: Diagnosis not present

## 2017-02-05 DIAGNOSIS — M5134 Other intervertebral disc degeneration, thoracic region: Secondary | ICD-10-CM | POA: Diagnosis not present

## 2017-02-05 NOTE — Progress Notes (Signed)
Location:  [place] Room Number: 32 Place of Service:  SNF (31) Provider: Dalessandro Baldyga FNP-C  Blanchie Serve, MD  Patient Care Team: Blanchie Serve, MD as PCP - General (Internal Medicine) Jettie Booze, MD as Consulting Physician (Cardiology) Marchia Bond, MD as Consulting Physician (Orthopedic Surgery) Kathrynn Ducking, MD as Consulting Physician (Neurology) Brand Males, MD as Consulting Physician (Pulmonary Disease) Peg Fifer, Nelda Bucks, NP as Nurse Practitioner (Family Medicine)  Extended Emergency Contact Information Primary Emergency Contact: [place] of [place] Phone: [phone] Relation: Son Secondary Emergency Contact: Ricardo Jericho [place] of [place] Phone: [phone] Mobile Phone: [phone] Relation: Daughter  Code Status:  DNR Goals of care: Advanced Directive information Advanced Directives 02/05/2017  Does Patient Have a Medical Advance Directive? Yes  Type of Advance Directive Out of facility DNR (pink MOST or yellow form);[place];Living will  Does patient want to make changes to medical advance directive? -  Copy of [place] in Chart? Yes  Pre-existing out of facility DNR order (yellow form or pink MOST form) Yellow form placed in chart (order not valid for inpatient use)     Chief Complaint  Patient presents with  . Acute Visit    Behavior change    HPI:  Pt is a 83 y.o. female seen today at [facility] for an acute visit for evaluation of yelling out for help.She has a significant medical history of Alzheimer's among other conditions.Facility Nurse reports patient calls out " Help!Help!" and wants to call family members.Sometimes states going to get her belongings to go home.Whenever asked what assistance she needs, she does not recall call for help.She is currently on Seroquel at 2 pm but has had behavioral changes in the  mornings too.No fever, chills or urinary tract infection symptoms reported.No recent fall episodes. Left heel ulcer and shin skin tear managed by facility Nurse.     Past Medical History:  Diagnosis Date  . Alzheimer's disease 06/22/2015  . Anemia    iron deficient  . Anemia   . Anxiety   . Chronic airway obstruction, not elsewhere classified   . Chronic back pain   . Chronic cystitis   . Cystitis, chronic    Dr Gaynelle Arabian  . Degenerative arthritis   . Depression   . Disturbance of skin sensation   . Disturbance of skin sensation   . Dyslipidemia   . Dyspnea    Dr Chase Caller  . Esophageal reflux   . Essential and other specified forms of tremor 12/11/2012  . Foot drop, bilateral 12/21/2013  . Gait disorder   . GERD (gastroesophageal reflux disease)   . Hemorrhoids   . Hereditary and idiopathic peripheral neuropathy 10/11/2015  . Hernia   . History of cerebrovascular disease    Moderate level small vessel disease  . Hyperlipidemia    mild  . Hypertension   . Osteoarthritis    right knee injection per Dr Trudie Reed  . Osteoporosis   . Other abnormal clinical finding   . Pain in joint, upper arm   . Peripheral vascular disease ([place])   . Rheumatoid arthritis ([place])    Dr Trudie Reed  . Tobacco use disorder   . Tremor    and gait disorder--Dr Jannifer Franklin  . Unspecified vitamin D deficiency    Past Surgical History:  Procedure Laterality Date  . APPENDECTOMY    . BACK SURGERY Bilateral    X2  . Bladder resuspension procedure    .  CATARACT EXTRACTION Bilateral   . FEMUR IM NAIL Right 02/22/2014   Procedure: INTRAMEDULLARY (IM) NAIL FEMORAL;  Surgeon: Johnny Bridge, MD;  Location: [place];  Service: Orthopedics;  Laterality: Right;  . INGUINAL HERNIA REPAIR Bilateral   . ORIF ANKLE FRACTURE Left 09/19/2016   Procedure: OPEN REDUCTION INTERNAL FIXATION (ORIF) ANKLE FRACTURE;  Surgeon: Vickey Huger, MD;  Location: [place];  Service: Orthopedics;  Laterality: Left;  . TONSILLECTOMY       Allergies  Allergen Reactions  . Novocain [Procaine]     Passed  out  . Limbrel [Flavocoxid]     Dizziness   . Sertraline Anxiety    Outpatient Encounter Medications as of 02/05/2017  Medication Sig  . acetaminophen (TYLENOL) 650 MG CR tablet Take 650 mg by mouth 2 (two) times daily.   Marland Kitchen alum & mag hydroxide-simeth (MAALOX/MYLANTA) 200-200-20 MG/5ML suspension Take 30 mLs by mouth every 4 (four) hours as needed for indigestion or heartburn.  . Amino Acids-Protein Hydrolys (FEEDING SUPPLEMENT, PRO-STAT SUGAR FREE 64,) LIQD Take 30 mLs by mouth daily.  . Calcium Carbonate-Vitamin D (CALTRATE 600+D) 600-400 MG-UNIT per tablet Take 1 tablet by mouth daily.  . collagenase (SANTYL) ointment Apply 1 application topically every 3 (three) days.  . furosemide (LASIX) 20 MG tablet Take 20 mg by mouth daily.  Marland Kitchen Hyoscyamine Sulfate SL (LEVSIN/SL) 0.125 MG SUBL Place under the tongue every 12 (twelve) hours as needed (for cramps).   . memantine (NAMENDA) 10 MG tablet Take 10 mg by mouth 2 (two) times daily.  . metoprolol tartrate (LOPRESSOR) 25 MG tablet Take 12.5 mg by mouth 2 (two) times daily. Hold for SBP <110 or HR <60/min  . mineral oil-hydrophilic petrolatum (AQUAPHOR) ointment Apply 1 application topically 2 (two) times daily.  . mirtazapine (REMERON) 15 MG tablet Take 15 mg by mouth at bedtime.  . Misc Natural Products (OSTEO BI-FLEX TRIPLE STRENGTH) TABS Take 1 tablet by mouth daily.  . Multiple Vitamin (MULTIVITAMIN) tablet Take 1 tablet by mouth daily.  . Nutritional Supplements (RESOURCE 2.0) LIQD Take 120 mLs by mouth 3 (three) times daily.   Marland Kitchen omeprazole (PRILOSEC) 20 MG capsule Take 20 mg by mouth daily.  . ondansetron (ZOFRAN) 4 MG tablet Take 4 mg by mouth every 6 (six) hours as needed for nausea or vomiting.  Marland Kitchen QUEtiapine (SEROQUEL) 25 MG tablet Take 1 tablet (25 mg total) by mouth daily. At 2 Pm  . ranitidine (ZANTAC) 150 MG tablet Take 150 mg by mouth at bedtime.   .  sennosides-docusate sodium (SENOKOT-S) 8.6-50 MG tablet Take 2 tablets by mouth daily.   No facility-administered encounter medications on file as of 02/05/2017.     Review of Systems  Constitutional: Negative for activity change, chills, fatigue and fever.  HENT: Negative for congestion, rhinorrhea, sinus pressure, sinus pain, sneezing and sore throat.   Respiratory: Negative for cough, chest tightness, shortness of breath and wheezing.   Cardiovascular: Positive for leg swelling. Negative for chest pain and palpitations.  Gastrointestinal: Negative for abdominal distention, abdominal pain, constipation, diarrhea and vomiting.       Chronic nausea   Genitourinary: Negative for dysuria, flank pain, frequency and urgency.  Musculoskeletal: Positive for gait problem.  Skin: Negative for color change, pallor and rash.  Neurological: Negative for dizziness, seizures, syncope, light-headedness and headaches.  Psychiatric/Behavioral: Positive for confusion. Negative for agitation, hallucinations and sleep disturbance. The patient is not nervous/anxious.     Immunization History  Administered Date(s) Administered  .  Influenza Whole 01/16/2010  . Influenza-Unspecified 02/03/2006, 01/09/2007, 01/05/2013, 12/22/2014, 12/28/2015, 12/26/2016  . PPD Test 05/01/2004, 02/24/2014  . Pneumococcal Polysaccharide-23 01/21/2006  . Pneumococcal-Unspecified 12/19/1998  . Td 03/18/1998  . Tdap 09/18/2016   Pertinent  Health Maintenance Due  Topic Date Due  . PNA vac Low Risk Adult (2 of 2 - PCV13) 03/18/2017 (Originally 01/22/2007)  . INFLUENZA VACCINE  Completed  . DEXA SCAN  Completed   Fall Risk  10/28/2016 06/20/2015  Falls in the past year? No No  Risk for fall due to : - Impaired balance/gait   Functional Status Survey:    Vitals:   02/05/17 1323  BP: (!) 148/62  Pulse: 90  Resp: 13  Temp: 97.7 F (36.5 C)  SpO2: 93%  Weight: 121 lb (54.9 kg)  Height: 5\' 1"  (1.549 m)   Body mass index  is 22.86 kg/m. Physical Exam  Constitutional: She appears well-developed and well-nourished.  Pleasantly confused elderly in no acute distress   HENT:  Head: Normocephalic.  Mouth/Throat: Oropharynx is clear and moist. No oropharyngeal exudate.  Eyes: Conjunctivae and EOM are normal. Pupils are equal, round, and reactive to light. Right eye exhibits no discharge. Left eye exhibits no discharge. No scleral icterus.  Eye glasses in place  Neck: Normal range of motion. No JVD present. No thyromegaly present.  Cardiovascular: Normal rate, regular rhythm, normal heart sounds and intact distal pulses. Exam reveals no gallop and no friction rub.  No murmur heard. Pulmonary/Chest: Effort normal and breath sounds normal. No respiratory distress. She has no wheezes. She has no rales.  Abdominal: Soft. Bowel sounds are normal. She exhibits no distension. There is no tenderness. There is no rebound and no guarding.  Musculoskeletal: She exhibits no tenderness.  Moves x 4 extremities.Self propel on wheelchair.Right lower extremity Ted hose in place.   Lymphadenopathy:    She has no cervical adenopathy.  Neurological: She is alert.  Pleasantly confused at her baseline  Skin: Skin is warm and dry. No rash noted. No erythema.  Left shin area skin tear without any signs of infection.   Left heel ulcer old dressing with moderate amounts of drainage.  Psychiatric: She has a normal mood and affect.  Saying Help!Help! Even when provider is present.     Labs reviewed: Recent Labs    09/18/16 2232 09/18/16 2239 09/26/16 11/04/16 12/12/16  NA 137 136 139 142 139  K 4.6 4.5 4.5 3.6 4.0  CL 99* 96*  --   --  103  CO2 33*  --   --   --  28  GLUCOSE 115* 110*  --   --   --   BUN 24* 30* 16 14 24*  CREATININE 1.06* 1.00 0.9 0.9 0.93  CALCIUM 9.4  --   --   --  9.4   Recent Labs    05/16/16 12/12/16  AST 16 17  ALT 7 8  ALKPHOS 70 79  BILITOT  --  0.5  PROT  --  6.6  ALBUMIN  --  3.2   Recent  Labs    05/16/16 09/18/16 2232 09/18/16 2239 09/26/16 12/12/16  WBC 6.9 11.9*  --  10.5 9.8  NEUTROABS  --  8.4*  --   --   --   HGB 14.3 13.0 13.3 13.2 13.6  HCT 44 39.5 39.0 40 42  MCV  --  99.7  --   --   --   PLT 260 358  --  447*  --  Lab Results  Component Value Date   TSH 1.67 11/07/2016   Lab Results  Component Value Date   HGBA1C 5.8 12/16/2016   Lab Results  Component Value Date   CHOL 174 12/16/2016   HDL 68 12/16/2016   LDLCALC 80 12/16/2016   TRIG 159 12/16/2016    Significant Diagnostic Results in last 30 days:  No results found.  Assessment/Plan 1. Pressure ulcer of left heel, stage 2 Moderate amount of brownish drainage on old dressing noted.surrounding skin tissue without any signs of infections.D/c current dressing changes then cleanse ulcer with saline,pat dry,cover with calcium alginate and foam dressing.Change dressing every three days.   2. Alzheimer's dementia with behavioral disturbance Calls out Help!Help! Despite not needing any help.She does not recall call for help.Afebrile and no signs of urinary tract infections.Will change Seroquel 25 mg tablet to one by mouth once daily in the morning.Continue to monitor.  Family/ staff Communication: Reviewed plan of care with patient and facility Nurse.  Labs/tests ordered: None   Tamma Brigandi C Exilda Wilhite, NP

## 2017-02-10 ENCOUNTER — Encounter: Payer: Self-pay | Admitting: Internal Medicine

## 2017-02-10 ENCOUNTER — Non-Acute Institutional Stay (SKILLED_NURSING_FACILITY): Payer: Medicare Other | Admitting: Internal Medicine

## 2017-02-10 DIAGNOSIS — S91101A Unspecified open wound of right great toe without damage to nail, initial encounter: Secondary | ICD-10-CM | POA: Diagnosis not present

## 2017-02-10 DIAGNOSIS — K219 Gastro-esophageal reflux disease without esophagitis: Secondary | ICD-10-CM | POA: Diagnosis not present

## 2017-02-10 DIAGNOSIS — L89623 Pressure ulcer of left heel, stage 3: Secondary | ICD-10-CM | POA: Diagnosis not present

## 2017-02-10 DIAGNOSIS — G309 Alzheimer's disease, unspecified: Secondary | ICD-10-CM

## 2017-02-10 DIAGNOSIS — S81819D Laceration without foreign body, unspecified lower leg, subsequent encounter: Secondary | ICD-10-CM

## 2017-02-10 DIAGNOSIS — F0281 Dementia in other diseases classified elsewhere with behavioral disturbance: Secondary | ICD-10-CM

## 2017-02-10 DIAGNOSIS — I739 Peripheral vascular disease, unspecified: Secondary | ICD-10-CM

## 2017-02-10 NOTE — Progress Notes (Signed)
Location:  [place] Room Number: 32 Place of Service:  SNF [phone]) Provider:  Blanchie Serve MD  Blanchie Serve, MD  Patient Care Team: Blanchie Serve, MD as PCP - General (Internal Medicine) Jettie Booze, MD as Consulting Physician (Cardiology) Marchia Bond, MD as Consulting Physician (Orthopedic Surgery) Kathrynn Ducking, MD as Consulting Physician (Neurology) Brand Males, MD as Consulting Physician (Pulmonary Disease) Ngetich, Nelda Bucks, NP as Nurse Practitioner (Family Medicine)  Extended Emergency Contact Information Primary Emergency Contact: [place] of [place] Phone: [phone] Relation: Son Secondary Emergency Contact: Ricardo Jericho [place] of [place] Phone: [phone] Mobile Phone: [phone] Relation: Daughter  Code Status:  DNR  Goals of care: Advanced Directive information Advanced Directives 02/10/2017  Does Patient Have a Medical Advance Directive? Yes  Type of [place] of [place];Living will;Out of facility DNR (pink MOST or yellow form)  Does patient want to make changes to medical advance directive? No - Patient declined  Copy of [place] in Chart? Yes  Pre-existing out of facility DNR order (yellow form or pink MOST form) Yellow form placed in chart (order not valid for inpatient use)     Chief Complaint  Patient presents with  . Medical Management of Chronic Issues    Routine Visit     HPI:  Pt is a 83 y.o. female seen today for medical management of chronic diseases. She is pleasantly confused with her dementia and denies any health concern. Per nursing, she sustained a skin tear to right shin on 11/04/21/16 during transfer. Staff also reports increased behavior changes with anxiety. Pt required ativan 0.5 mg po x 2 over the weekend. She has had hallucination where she sees dead people and is noticed to be hollering in the  hallway at times. She needs frequent redirection and is unable to participate in HPI and ROS with her dementia.    Past Medical History:  Diagnosis Date  . Alzheimer's disease 06/22/2015  . Anemia    iron deficient  . Anemia   . Anxiety   . Chronic airway obstruction, not elsewhere classified   . Chronic back pain   . Chronic cystitis   . Cystitis, chronic    Dr Gaynelle Arabian  . Degenerative arthritis   . Depression   . Disturbance of skin sensation   . Disturbance of skin sensation   . Dyslipidemia   . Dyspnea    Dr Chase Caller  . Esophageal reflux   . Essential and other specified forms of tremor 12/11/2012  . Foot drop, bilateral 12/21/2013  . Gait disorder   . GERD (gastroesophageal reflux disease)   . Hemorrhoids   . Hereditary and idiopathic peripheral neuropathy 10/11/2015  . Hernia   . History of cerebrovascular disease    Moderate level small vessel disease  . Hyperlipidemia    mild  . Hypertension   . Osteoarthritis    right knee injection per Dr Trudie Reed  . Osteoporosis   . Other abnormal clinical finding   . Pain in joint, upper arm   . Peripheral vascular disease ([place])   . Rheumatoid arthritis ([place])    Dr Trudie Reed  . Tobacco use disorder   . Tremor    and gait disorder--Dr Jannifer Franklin  . Unspecified vitamin D deficiency    Past Surgical History:  Procedure Laterality Date  . APPENDECTOMY    . BACK SURGERY Bilateral    X2  . Bladder resuspension procedure    .  CATARACT EXTRACTION Bilateral   . FEMUR IM NAIL Right 02/22/2014   Procedure: INTRAMEDULLARY (IM) NAIL FEMORAL;  Surgeon: Johnny Bridge, MD;  Location: [place];  Service: Orthopedics;  Laterality: Right;  . INGUINAL HERNIA REPAIR Bilateral   . ORIF ANKLE FRACTURE Left 09/19/2016   Procedure: OPEN REDUCTION INTERNAL FIXATION (ORIF) ANKLE FRACTURE;  Surgeon: Vickey Huger, MD;  Location: [place];  Service: Orthopedics;  Laterality: Left;  . TONSILLECTOMY      Allergies  Allergen Reactions  . Novocain [Procaine]      Passed  out  . Limbrel [Flavocoxid]     Dizziness   . Sertraline Anxiety    Outpatient Encounter Medications as of 02/10/2017  Medication Sig  . acetaminophen (TYLENOL) 650 MG CR tablet Take 650 mg by mouth 2 (two) times daily.   Marland Kitchen alum & mag hydroxide-simeth (MAALOX/MYLANTA) 200-200-20 MG/5ML suspension Take 30 mLs by mouth every 4 (four) hours as needed for indigestion or heartburn.  . Amino Acids-Protein Hydrolys (FEEDING SUPPLEMENT, PRO-STAT SUGAR FREE 64,) LIQD Take 30 mLs by mouth daily.  . Calcium Carbonate-Vitamin D (CALTRATE 600+D) 600-400 MG-UNIT per tablet Take 1 tablet by mouth daily.  . furosemide (LASIX) 20 MG tablet Take 20 mg by mouth daily.  Marland Kitchen Hyoscyamine Sulfate SL (LEVSIN/SL) 0.125 MG SUBL Place under the tongue every 12 (twelve) hours as needed (for cramps).   . memantine (NAMENDA) 10 MG tablet Take 10 mg by mouth 2 (two) times daily.  . metoprolol tartrate (LOPRESSOR) 25 MG tablet Take 12.5 mg by mouth 2 (two) times daily. Hold for SBP <110 or HR <60/min  . mirtazapine (REMERON) 15 MG tablet Take 15 mg by mouth at bedtime.  . Misc Natural Products (OSTEO BI-FLEX TRIPLE STRENGTH) TABS Take 1 tablet by mouth daily.  . Multiple Vitamin (MULTIVITAMIN) tablet Take 1 tablet by mouth daily.  . Nutritional Supplements (RESOURCE 2.0) LIQD Take 120 mLs by mouth 3 (three) times daily.   Marland Kitchen omeprazole (PRILOSEC) 20 MG capsule Take 20 mg by mouth daily.  . ondansetron (ZOFRAN) 4 MG tablet Take 4 mg by mouth every 6 (six) hours as needed for nausea or vomiting.  Marland Kitchen QUEtiapine (SEROQUEL) 25 MG tablet Take 1 tablet (25 mg total) by mouth daily. At 2 Pm  . ranitidine (ZANTAC) 150 MG tablet Take 150 mg by mouth at bedtime.   . sennosides-docusate sodium (SENOKOT-S) 8.6-50 MG tablet Take 2 tablets by mouth daily.  . [DISCONTINUED] collagenase (SANTYL) ointment Apply 1 application topically every 3 (three) days.   No facility-administered encounter medications on file as of  02/10/2017.     Review of Systems  Unable to perform ROS: Dementia (limited)  Constitutional: Negative for appetite change, chills and fever.  HENT: Positive for hearing loss. Negative for congestion, ear pain and trouble swallowing.   Respiratory: Negative for cough and shortness of breath.   Cardiovascular: Negative for chest pain and palpitations.  Gastrointestinal: Positive for nausea. Negative for abdominal pain, constipation, diarrhea and vomiting.  Genitourinary: Negative for dysuria.  Musculoskeletal: Positive for gait problem.  Neurological: Negative for light-headedness and headaches.  Hematological: Bruises/bleeds easily.  Psychiatric/Behavioral: Positive for behavioral problems and confusion.    Immunization History  Administered Date(s) Administered  . Influenza Whole 01/16/2010  . Influenza-Unspecified 02/03/2006, 01/09/2007, 01/05/2013, 12/22/2014, 12/28/2015, 12/26/2016  . PPD Test 05/01/2004, 02/24/2014  . Pneumococcal Polysaccharide-23 01/21/2006  . Pneumococcal-Unspecified 12/19/1998  . Td 03/18/1998  . Tdap 09/18/2016   Pertinent  Health Maintenance Due  Topic Date Due  .  PNA vac Low Risk Adult (2 of 2 - PCV13) 03/18/2017 (Originally 01/22/2007)  . INFLUENZA VACCINE  Completed  . DEXA SCAN  Completed   Fall Risk  10/28/2016 06/20/2015  Falls in the past year? No No  Risk for fall due to : - Impaired balance/gait   Functional Status Survey:    Vitals:   02/10/17 1015  BP: 122/72  Pulse: 92  Resp: 20  Temp: 97.7 F (36.5 C)  TempSrc: Oral  SpO2: 93%  Weight: 121 lb (54.9 kg)  Height: 5\' 1"  (1.549 m)   Body mass index is 22.86 kg/m.   Wt Readings from Last 3 Encounters:  02/10/17 121 lb (54.9 kg)  02/05/17 121 lb (54.9 kg)  01/20/17 117 lb 3.2 oz (53.2 kg)   Physical Exam  Constitutional: She appears well-developed. No distress.  HENT:  Head: Normocephalic and atraumatic.  Mouth/Throat: Oropharynx is clear and moist.  Eyes: Conjunctivae  and EOM are normal. Right eye exhibits no discharge. Left eye exhibits no discharge.  Neck: Neck supple.  Cardiovascular: Normal rate and regular rhythm.  Pulmonary/Chest: Effort normal and breath sounds normal.  Abdominal: Soft. Bowel sounds are normal. There is no tenderness.  Musculoskeletal: She exhibits edema and deformity.  Can move all 4 extremities, weakness more prominent to lower extremities, on wheelchair and has to be wheeled. 1+ pitting leg edema. Chronic venous stasis skin changes  Lymphadenopathy:    She has no cervical adenopathy.  Neurological: She is alert.  Oriented to self and place only  Skin: Skin is warm and dry. She is not diaphoretic.  Skin tear to RLE with 4 steristrips, open area to right great toe with surrounding skin blanchable, no drainage or discharge, no signs of infection. Scabbed area to LLE with chronic venous stasis changes.  Easy bruising to arms and legs Stage 3 pressure ulcer to right heel with some slough present, No erythema and tender to touch  Psychiatric:  Pleasantly confused, repeats questions, is redirectable    Labs reviewed: Recent Labs    09/18/16 2232 09/18/16 2239 09/26/16 11/04/16 12/12/16  NA 137 136 139 142 139  139  K 4.6 4.5 4.5 3.6 4.0  4.0  CL 99* 96*  --   --  103  CO2 33*  --   --   --  28  GLUCOSE 115* 110*  --   --   --   BUN 24* 30* 16 14 24*  24*  CREATININE 1.06* 1.00 0.9 0.9 0.9  0.93  CALCIUM 9.4  --   --   --  9.4   Recent Labs    05/16/16 12/12/16  AST 16 17  17   ALT 7 8  8   ALKPHOS 70 79  79  BILITOT  --  0.5  PROT  --  6.6  ALBUMIN  --  3.2   Recent Labs    09/18/16 2232  09/26/16 12/12/16  WBC 11.9*  --  10.5 9.8  9.8  NEUTROABS 8.4*  --   --   --   HGB 13.0   < > 13.2 13.6  13.6  HCT 39.5   < > 40 42  42  MCV 99.7  --   --   --   PLT 358  --  447* 350   < > = values in this interval not displayed.   Lab Results  Component Value Date   TSH 1.67 11/07/2016   Lab Results    Component Value Date  HGBA1C 5.8 12/16/2016   Lab Results  Component Value Date   CHOL 174 12/16/2016   CHOL 174 12/16/2016   HDL 68 12/16/2016   HDL 68 12/16/2016   LDLCALC 80 12/16/2016   LDLCALC 80 12/16/2016   TRIG 159 12/16/2016    Significant Diagnostic Results in last 30 days:  No results found.  Assessment/Plan   Stage 3 pressure ulcer To left heel. Will need to offload pressure from left heel which is a challenge for her with her keeping her legs hanging down from her wheelchair and keeping heel on floor. Will need to apply heel floaters. Clean area with NS, apply santyl and calcium aleginate with allevyn dressing for now and monitor for signs of infection. Continue tylenol for pain. Add decubivite to promote wound healing  gerd Continue her prilosec and ranitidine, no changes made  PVD With chronic skin changes, advised to keep legs elevated. Add ted hose for both legs during daytime. Continue lasix. Monitor bmp periodically.   Skin tear To RLE. steristrip in place. Continue skin care.  Open wound to right great toe Skin prep for now and monitor for signs of infection  Dementia with behavioral disturbance Has hallucination. Infectious etiology ruled out. Currently on seroquel 25 mg daily and namenda 10 mg bid. Also on remeron 15 mg daily. Change seroquel to 25 mg bid and monitor. Redirection and supportive care. Will write for ativan 0.5 mg q12h prn severe anxiety/ agitation x 1 week.      Family/ staff Communication: reviewed care plan with patient and charge nurse.    Labs/tests ordered:  none   Blanchie Serve, MD Internal Medicine [facility] [address] [place], [place] [address] Cell Phone (Monday-Friday 8 am - 5 pm): [phone] On Call: [phone] and follow prompts after 5 pm and on weekends Office Phone: [phone] Office Fax: [phone]

## 2017-02-21 ENCOUNTER — Encounter: Payer: Self-pay | Admitting: Family

## 2017-02-21 ENCOUNTER — Non-Acute Institutional Stay (SKILLED_NURSING_FACILITY): Payer: Medicare Other | Admitting: Family

## 2017-02-21 DIAGNOSIS — F0281 Dementia in other diseases classified elsewhere with behavioral disturbance: Secondary | ICD-10-CM | POA: Diagnosis not present

## 2017-02-21 DIAGNOSIS — R451 Restlessness and agitation: Secondary | ICD-10-CM

## 2017-02-21 DIAGNOSIS — G309 Alzheimer's disease, unspecified: Secondary | ICD-10-CM | POA: Diagnosis not present

## 2017-02-24 DIAGNOSIS — Z79899 Other long term (current) drug therapy: Secondary | ICD-10-CM | POA: Diagnosis not present

## 2017-02-24 DIAGNOSIS — D510 Vitamin B12 deficiency anemia due to intrinsic factor deficiency: Secondary | ICD-10-CM | POA: Diagnosis not present

## 2017-02-24 DIAGNOSIS — E612 Magnesium deficiency: Secondary | ICD-10-CM | POA: Diagnosis not present

## 2017-02-24 DIAGNOSIS — M6281 Muscle weakness (generalized): Secondary | ICD-10-CM | POA: Diagnosis not present

## 2017-02-24 DIAGNOSIS — R609 Edema, unspecified: Secondary | ICD-10-CM | POA: Diagnosis not present

## 2017-02-26 DIAGNOSIS — L570 Actinic keratosis: Secondary | ICD-10-CM | POA: Diagnosis not present

## 2017-02-26 DIAGNOSIS — C44622 Squamous cell carcinoma of skin of right upper limb, including shoulder: Secondary | ICD-10-CM | POA: Diagnosis not present

## 2017-02-26 DIAGNOSIS — L821 Other seborrheic keratosis: Secondary | ICD-10-CM | POA: Diagnosis not present

## 2017-03-02 NOTE — Progress Notes (Signed)
Location:  [place] Room Number: 32 Place of Service:  SNF (31) Provider: Darrik Richman FNP-C  Blanchie Serve, MD  Patient Care Team: Blanchie Serve, MD as PCP - General (Internal Medicine) Jettie Booze, MD as Consulting Physician (Cardiology) Marchia Bond, MD as Consulting Physician (Orthopedic Surgery) Kathrynn Ducking, MD as Consulting Physician (Neurology) Brand Males, MD as Consulting Physician (Pulmonary Disease) Breeona Waid, Nelda Bucks, NP as Nurse Practitioner (Family Medicine)  Extended Emergency Contact Information Primary Emergency Contact: [place] of [place] Phone: [phone] Relation: Son Secondary Emergency Contact: Ricardo Jericho [place] of [place] Phone: [phone] Mobile Phone: [phone] Relation: Daughter  Code Status:  DNR Goals of care: Advanced Directive information Advanced Directives 02/21/2017  Does Patient Have a Medical Advance Directive? Yes  Type of Advance Directive Out of facility DNR (pink MOST or yellow form);[place];Living will  Does patient want to make changes to medical advance directive? -  Copy of [place] in Chart? Yes  Pre-existing out of facility DNR order (yellow form or pink MOST form) Yellow form placed in chart (order not valid for inpatient use)     Chief Complaint  Patient presents with  . Acute Visit    medication management for agitation     HPI:  Pt is a 83 y.o. female seen today at [facility] for an acute visit for evaluation of agitation.she is seen in her room today per facility Nurse request. Nurse reports patient continues to loudly yell for help despite not wanting any assistance.Yelling more worse in the afternoon.Ativan tablet has help some per facility Nurse. She denies any pain,fever,chills or cough.     Past Medical History:  Diagnosis Date  . Alzheimer's disease 06/22/2015  . Anemia     iron deficient  . Anemia   . Anxiety   . Chronic airway obstruction, not elsewhere classified   . Chronic back pain   . Chronic cystitis   . Cystitis, chronic    Dr Gaynelle Arabian  . Degenerative arthritis   . Depression   . Disturbance of skin sensation   . Disturbance of skin sensation   . Dyslipidemia   . Dyspnea    Dr Chase Caller  . Esophageal reflux   . Essential and other specified forms of tremor 12/11/2012  . Foot drop, bilateral 12/21/2013  . Gait disorder   . GERD (gastroesophageal reflux disease)   . Hemorrhoids   . Hereditary and idiopathic peripheral neuropathy 10/11/2015  . Hernia   . History of cerebrovascular disease    Moderate level small vessel disease  . Hyperlipidemia    mild  . Hypertension   . Osteoarthritis    right knee injection per Dr Trudie Reed  . Osteoporosis   . Other abnormal clinical finding   . Pain in joint, upper arm   . Peripheral vascular disease ([place])   . Rheumatoid arthritis ([place])    Dr Trudie Reed  . Tobacco use disorder   . Tremor    and gait disorder--Dr Jannifer Franklin  . Unspecified vitamin D deficiency    Past Surgical History:  Procedure Laterality Date  . APPENDECTOMY    . BACK SURGERY Bilateral    X2  . Bladder resuspension procedure    . CATARACT EXTRACTION Bilateral   . FEMUR IM NAIL Right 02/22/2014   Procedure: INTRAMEDULLARY (IM) NAIL FEMORAL;  Surgeon: Johnny Bridge, MD;  Location: [place];  Service: Orthopedics;  Laterality: Right;  . INGUINAL  HERNIA REPAIR Bilateral   . ORIF ANKLE FRACTURE Left 09/19/2016   Procedure: OPEN REDUCTION INTERNAL FIXATION (ORIF) ANKLE FRACTURE;  Surgeon: Vickey Huger, MD;  Location: [place];  Service: Orthopedics;  Laterality: Left;  . TONSILLECTOMY      Allergies  Allergen Reactions  . Novocain [Procaine]     Passed  out  . Limbrel [Flavocoxid]     Dizziness   . Sertraline Anxiety    Outpatient Encounter Medications as of 02/21/2017  Medication Sig  . acetaminophen (TYLENOL) 650 MG CR tablet  Take 650 mg by mouth 2 (two) times daily.   Marland Kitchen alum & mag hydroxide-simeth (MAALOX/MYLANTA) 200-200-20 MG/5ML suspension Take 30 mLs by mouth every 4 (four) hours as needed for indigestion or heartburn.  . Amino Acids-Protein Hydrolys (FEEDING SUPPLEMENT, PRO-STAT SUGAR FREE 64,) LIQD Take 30 mLs by mouth daily.  . Calcium Carbonate-Vitamin D (CALTRATE 600+D) 600-400 MG-UNIT per tablet Take 1 tablet by mouth daily.  . furosemide (LASIX) 20 MG tablet Take 20 mg by mouth daily.  Marland Kitchen Hyoscyamine Sulfate SL (LEVSIN/SL) 0.125 MG SUBL Place under the tongue every 12 (twelve) hours as needed (for cramps).   . memantine (NAMENDA) 10 MG tablet Take 10 mg by mouth 2 (two) times daily.  . metoprolol tartrate (LOPRESSOR) 25 MG tablet Take 12.5 mg by mouth 2 (two) times daily. Hold for SBP <110 or HR <60/min  . mirtazapine (REMERON) 15 MG tablet Take 15 mg by mouth at bedtime.  . Misc Natural Products (OSTEO BI-FLEX TRIPLE STRENGTH) TABS Take 1 tablet by mouth daily.  . Multiple Vitamin (MULTIVITAMIN) tablet Take 1 tablet by mouth daily.  . Nutritional Supplements (RESOURCE 2.0) LIQD Take 120 mLs by mouth 3 (three) times daily.   Marland Kitchen omeprazole (PRILOSEC) 20 MG capsule Take 20 mg by mouth daily.  . ondansetron (ZOFRAN) 4 MG tablet Take 4 mg by mouth every 6 (six) hours as needed for nausea or vomiting.  Marland Kitchen QUEtiapine (SEROQUEL) 25 MG tablet Take 1 tablet (25 mg total) by mouth daily. At 2 Pm  . ranitidine (ZANTAC) 150 MG tablet Take 150 mg by mouth at bedtime.   . sennosides-docusate sodium (SENOKOT-S) 8.6-50 MG tablet Take 2 tablets by mouth daily.   No facility-administered encounter medications on file as of 02/21/2017.     Review of Systems  Constitutional: Negative for activity change, appetite change, chills, fatigue and fever.  HENT: Negative for congestion, rhinorrhea, sinus pressure, sinus pain, sneezing and sore throat.   Eyes: Negative for discharge, redness and itching.  Respiratory: Negative for  cough, chest tightness, shortness of breath and wheezing.   Cardiovascular: Negative for chest pain, palpitations and leg swelling.  Gastrointestinal: Negative for abdominal distention, abdominal pain, constipation, diarrhea, nausea and vomiting.  Genitourinary: Negative for dysuria, flank pain, frequency and urgency.  Musculoskeletal: Positive for gait problem. Negative for arthralgias.  Skin: Negative for color change, pallor and rash.  Neurological: Negative for dizziness, light-headedness, numbness and headaches.  Psychiatric/Behavioral: Positive for agitation and confusion. Negative for hallucinations and sleep disturbance. The patient is not nervous/anxious.     Immunization History  Administered Date(s) Administered  . Influenza Whole 01/16/2010  . Influenza-Unspecified 02/03/2006, 01/09/2007, 01/05/2013, 12/22/2014, 12/28/2015, 12/26/2016  . PPD Test 05/01/2004, 02/24/2014  . Pneumococcal Polysaccharide-23 01/21/2006  . Pneumococcal-Unspecified 12/19/1998  . Td 03/18/1998  . Tdap 09/18/2016   Pertinent  Health Maintenance Due  Topic Date Due  . PNA vac Low Risk Adult (2 of 2 - PCV13) 03/18/2017 (Originally 01/22/2007)  .  INFLUENZA VACCINE  Completed  . DEXA SCAN  Completed   Fall Risk  10/28/2016 06/20/2015  Falls in the past year? No No  Risk for fall due to : - Impaired balance/gait   Functional Status Survey:    Vitals:   02/21/17 1135  BP: (!) 144/80  Pulse: 94  Resp: 20  Temp: 97.7 F (36.5 C)  SpO2: 96%  Weight: 118 lb (53.5 kg)  Height: 5\' 1"  (1.549 m)   Body mass index is 22.3 kg/m. Physical Exam  Constitutional: She appears well-developed and well-nourished.  Elderly in no acute distress   HENT:  Head: Normocephalic.  Right Ear: External ear normal.  Left Ear: External ear normal.  Mouth/Throat: Oropharynx is clear and moist. No oropharyngeal exudate.  Eyes: Conjunctivae and EOM are normal. Pupils are equal, round, and reactive to light. Right eye  exhibits no discharge. Left eye exhibits no discharge. No scleral icterus.  Neck: Normal range of motion. No JVD present. No thyromegaly present.  Cardiovascular: Normal rate, regular rhythm, normal heart sounds and intact distal pulses. Exam reveals no gallop and no friction rub.  No murmur heard. Pulmonary/Chest: Effort normal and breath sounds normal. No respiratory distress. She has no wheezes. She has no rales.  Abdominal: Soft. Bowel sounds are normal. She exhibits no distension. There is no tenderness. There is no rebound and no guarding.  Lymphadenopathy:    She has no cervical adenopathy.  Neurological: She is alert. Coordination normal.  Pleasantly confused   Skin: Skin is warm and dry. No rash noted. No erythema.  Psychiatric: She has a normal mood and affect.   Labs reviewed: Recent Labs    09/18/16 2232 09/18/16 2239 09/26/16 11/04/16 12/12/16  NA 137 136 139 142 139  139  K 4.6 4.5 4.5 3.6 4.0  4.0  CL 99* 96*  --   --  103  CO2 33*  --   --   --  28  GLUCOSE 115* 110*  --   --   --   BUN 24* 30* 16 14 24*  24*  CREATININE 1.06* 1.00 0.9 0.9 0.9  0.93  CALCIUM 9.4  --   --   --  9.4   Recent Labs    05/16/16 12/12/16  AST 16 17  17   ALT 7 8  8   ALKPHOS 70 79  79  BILITOT  --  0.5  PROT  --  6.6  ALBUMIN  --  3.2   Recent Labs    09/18/16 2232  09/26/16 12/12/16  WBC 11.9*  --  10.5 9.8  9.8  NEUTROABS 8.4*  --   --   --   HGB 13.0   < > 13.2 13.6  13.6  HCT 39.5   < > 40 42  42  MCV 99.7  --   --   --   PLT 358  --  447* 350   < > = values in this interval not displayed.   Lab Results  Component Value Date   TSH 1.67 11/07/2016   Lab Results  Component Value Date   HGBA1C 5.8 12/16/2016   Lab Results  Component Value Date   CHOL 174 12/16/2016   CHOL 174 12/16/2016   HDL 68 12/16/2016   HDL 68 12/16/2016   LDLCALC 80 12/16/2016   LDLCALC 80 12/16/2016   TRIG 159 12/16/2016    Significant Diagnostic Results in last 30 days:  No  results found.  Assessment/Plan 1. Agitation  Afebrile.continues to yell for help despite not needing any assistance.Suspect possible sundowning.will change Seroquel 25 mg tablet to one by mouth daily at  9 am and 1 PM. Continue to monitor.    2. Alzheimer's dementia with behavioral disturbance Yells help as above.continue to assist with ADL's. Fall and safety precautions. Add Aricept 5 mg tablet daily.continue to monitor for behavioral changes.   Family/ staff Communication: Reviewed plan of care with patient and facility Nurse.  Labs/tests ordered: None   Shanterria Franta C Kelvon Giannini, NP

## 2017-03-14 ENCOUNTER — Non-Acute Institutional Stay (SKILLED_NURSING_FACILITY): Payer: Medicare Other | Admitting: Family

## 2017-03-14 ENCOUNTER — Encounter: Payer: Self-pay | Admitting: Family

## 2017-03-14 DIAGNOSIS — I739 Peripheral vascular disease, unspecified: Secondary | ICD-10-CM | POA: Diagnosis not present

## 2017-03-14 DIAGNOSIS — F0281 Dementia in other diseases classified elsewhere with behavioral disturbance: Secondary | ICD-10-CM

## 2017-03-14 DIAGNOSIS — L89620 Pressure ulcer of left heel, unstageable: Secondary | ICD-10-CM | POA: Diagnosis not present

## 2017-03-14 DIAGNOSIS — S81811A Laceration without foreign body, right lower leg, initial encounter: Secondary | ICD-10-CM

## 2017-03-14 DIAGNOSIS — G309 Alzheimer's disease, unspecified: Secondary | ICD-10-CM

## 2017-03-14 DIAGNOSIS — I10 Essential (primary) hypertension: Secondary | ICD-10-CM

## 2017-03-14 NOTE — Progress Notes (Signed)
Location:  [place] Room Number: 32 Place of Service:  SNF (31) Provider: Derrico Zhong FNP-C   Blanchie Serve, MD  Patient Care Team: Blanchie Serve, MD as PCP - General (Internal Medicine) Jettie Booze, MD as Consulting Physician (Cardiology) Marchia Bond, MD as Consulting Physician (Orthopedic Surgery) Kathrynn Ducking, MD as Consulting Physician (Neurology) Brand Males, MD as Consulting Physician (Pulmonary Disease) Jerusha Reising, Nelda Bucks, NP as Nurse Practitioner (Family Medicine)  Extended Emergency Contact Information Primary Emergency Contact: [place] of [place] Phone: [phone] Relation: Son Secondary Emergency Contact: Ricardo Jericho [place] of [place] Phone: [phone] Mobile Phone: [phone] Relation: Daughter  Code Status:DNR  Goals of care: Advanced Directive information Advanced Directives 03/14/2017  Does Patient Have a Medical Advance Directive? Yes  Type of Paramedic of [place];Out of facility DNR (pink MOST or yellow form);Living will  Does patient want to make changes to medical advance directive? -  Copy of [place] in Chart? Yes  Pre-existing out of facility DNR order (yellow form or pink MOST form) Yellow form placed in chart (order not valid for inpatient use)     Chief Complaint  Patient presents with  . Medical Management of Chronic Issues    routine visit; also fell this AM    HPI:  Pt is a 83 y.o. female seen today [facility] for medical management of chronic diseases.She has a medical history of HTN, PVD,GERD,Essential tremor,Depression,Alzheimer's dementia among other conditions.She is seen in her room today with facility Nurse at bedside.she denies any acute issues this visit.Facility Nurse reports patient slid off her recliner to the floor while awaiting to be transferred to her wheelchair. No acute injuries  reported. Her weight has been stable. Nurse reports patient continue to yell help!help !at times despite needing no help. Yelling has improved with Seroquel and Ativan. She is calm during the visit.  Past Medical History:  Diagnosis Date  . Alzheimer's disease 06/22/2015  . Anemia    iron deficient  . Anemia   . Anxiety   . Chronic airway obstruction, not elsewhere classified   . Chronic back pain   . Chronic cystitis   . Cystitis, chronic    Dr Gaynelle Arabian  . Degenerative arthritis   . Depression   . Disturbance of skin sensation   . Disturbance of skin sensation   . Dyslipidemia   . Dyspnea    Dr Chase Caller  . Esophageal reflux   . Essential and other specified forms of tremor 12/11/2012  . Foot drop, bilateral 12/21/2013  . Gait disorder   . GERD (gastroesophageal reflux disease)   . Hemorrhoids   . Hereditary and idiopathic peripheral neuropathy 10/11/2015  . Hernia   . History of cerebrovascular disease    Moderate level small vessel disease  . Hyperlipidemia    mild  . Hypertension   . Osteoarthritis    right knee injection per Dr Trudie Reed  . Osteoporosis   . Other abnormal clinical finding   . Pain in joint, upper arm   . Peripheral vascular disease ([place])   . Rheumatoid arthritis ([place])    Dr Trudie Reed  . Tobacco use disorder   . Tremor    and gait disorder--Dr Jannifer Franklin  . Unspecified vitamin D deficiency    Past Surgical History:  Procedure Laterality Date  . APPENDECTOMY    . BACK SURGERY Bilateral    X2  . Bladder resuspension procedure    .  CATARACT EXTRACTION Bilateral   . FEMUR IM NAIL Right 02/22/2014   Procedure: INTRAMEDULLARY (IM) NAIL FEMORAL;  Surgeon: Johnny Bridge, MD;  Location: [place];  Service: Orthopedics;  Laterality: Right;  . INGUINAL HERNIA REPAIR Bilateral   . ORIF ANKLE FRACTURE Left 09/19/2016   Procedure: OPEN REDUCTION INTERNAL FIXATION (ORIF) ANKLE FRACTURE;  Surgeon: Vickey Huger, MD;  Location: [place];  Service: Orthopedics;  Laterality:  Left;  . TONSILLECTOMY      Allergies  Allergen Reactions  . Novocain [Procaine]     Passed  out  . Limbrel [Flavocoxid]     Dizziness   . Sertraline Anxiety    Allergies as of 03/14/2017      Reactions   Novocain [procaine]    Passed  out   Limbrel [flavocoxid]    Dizziness   Sertraline Anxiety      Medication List        Accurate as of 03/14/17  2:54 PM. Always use your most recent med list.          acetaminophen 650 MG CR tablet Commonly known as:  TYLENOL Take 650 mg by mouth 2 (two) times daily.   alum & mag hydroxide-simeth 200-200-20 MG/5ML suspension Commonly known as:  MAALOX/MYLANTA Take 30 mLs by mouth every 4 (four) hours as needed for indigestion or heartburn.   CALTRATE 600+D 600-400 MG-UNIT tablet Generic drug:  Calcium Carbonate-Vitamin D Take 1 tablet by mouth daily.   feeding supplement (PRO-STAT SUGAR FREE 64) Liqd Take 30 mLs by mouth 2 (two) times daily.   furosemide 20 MG tablet Commonly known as:  LASIX Take 20 mg by mouth daily.   LEVSIN/SL 0.125 MG Subl Generic drug:  Hyoscyamine Sulfate SL Place under the tongue every 12 (twelve) hours as needed (for cramps).   LORazepam 0.5 MG tablet Commonly known as:  ATIVAN Take 0.5 mg by mouth 2 (two) times daily as needed for anxiety.   memantine 10 MG tablet Commonly known as:  NAMENDA Take 10 mg by mouth 2 (two) times daily.   metoprolol tartrate 25 MG tablet Commonly known as:  LOPRESSOR Take 12.5 mg by mouth 2 (two) times daily. Hold for SBP <110 or HR <60/min   mirtazapine 15 MG tablet Commonly known as:  REMERON Take 15 mg by mouth at bedtime.   multivitamin tablet Take 1 tablet by mouth daily.   omeprazole 20 MG capsule Commonly known as:  PRILOSEC Take 20 mg by mouth daily.   ondansetron 4 MG tablet Commonly known as:  ZOFRAN Take 4 mg by mouth every 6 (six) hours as needed for nausea or vomiting.   OSTEO BI-FLEX TRIPLE STRENGTH Tabs Take 1 tablet by mouth  daily.   QUEtiapine 25 MG tablet Commonly known as:  SEROQUEL Take 25 mg by mouth 2 (two) times daily. At 9AM and 1PM   ranitidine 150 MG tablet Commonly known as:  ZANTAC Take 150 mg by mouth at bedtime.   RESOURCE 2.0 Liqd Take 120 mLs by mouth 3 (three) times daily.   sennosides-docusate sodium 8.6-50 MG tablet Commonly known as:  SENOKOT-S Take 2 tablets by mouth daily.       Review of Systems  Unable to perform ROS: Dementia  Constitutional: Negative for activity change, chills, fatigue, fever and unexpected weight change.  HENT: Negative for congestion, rhinorrhea, sinus pressure, sinus pain, sneezing, sore throat and trouble swallowing.   Eyes: Negative for discharge, redness and itching.  Respiratory: Negative for cough, chest tightness,  shortness of breath and wheezing.   Cardiovascular: Positive for leg swelling. Negative for chest pain and palpitations.  Gastrointestinal: Negative for abdominal distention, abdominal pain, constipation, diarrhea, nausea and vomiting.  Endocrine: Negative for cold intolerance, heat intolerance, polydipsia, polyphagia and polyuria.  Genitourinary: Negative for dysuria, flank pain, frequency and urgency.  Musculoskeletal: Positive for gait problem.  Skin: Negative for color change, pallor and rash.       Left heel ulcer healed.right leg skin tear continue to be managed by facility Nurse   Neurological: Negative for dizziness, seizures, syncope, light-headedness and headaches.  Hematological: Does not bruise/bleed easily.  Psychiatric/Behavioral: Positive for confusion. Negative for behavioral problems, hallucinations and sleep disturbance. The patient is not nervous/anxious.     Immunization History  Administered Date(s) Administered  . Influenza Whole 01/16/2010  . Influenza-Unspecified 02/03/2006, 01/09/2007, 01/05/2013, 12/22/2014, 12/28/2015, 12/26/2016  . PPD Test 05/01/2004, 02/24/2014  . Pneumococcal Polysaccharide-23  01/21/2006  . Pneumococcal-Unspecified 12/19/1998  . Td 03/18/1998  . Tdap 09/18/2016   Pertinent  Health Maintenance Due  Topic Date Due  . PNA vac Low Risk Adult (2 of 2 - PCV13) 03/18/2017 (Originally 01/22/2007)  . INFLUENZA VACCINE  Completed  . DEXA SCAN  Completed   Fall Risk  10/28/2016 06/20/2015  Falls in the past year? No No  Risk for fall due to : - Impaired balance/gait    Vitals:   03/14/17 1200  BP: 128/82  Pulse: 90  Resp: (!) 22  Temp: 98.7 F (37.1 C)  SpO2: 96%  Weight: 114 lb 12.8 oz (52.1 kg)  Height: 5\' 1"  (1.549 m)   Body mass index is 21.69 kg/m. Physical Exam  Constitutional: She appears well-developed.  Frail elderly in no acute distress   HENT:  Head: Normocephalic.  Right Ear: External ear normal.  Left Ear: External ear normal.  Mouth/Throat: Oropharynx is clear and moist. No oropharyngeal exudate.  Eyes: Conjunctivae and EOM are normal. Pupils are equal, round, and reactive to light. Right eye exhibits no discharge. Left eye exhibits no discharge. No scleral icterus.  Neck: Normal range of motion. No JVD present. No thyromegaly present.  Cardiovascular: Normal rate, regular rhythm, normal heart sounds and intact distal pulses. Exam reveals no gallop and no friction rub.  No murmur heard. Pulmonary/Chest: Effort normal and breath sounds normal. No respiratory distress. She has no wheezes. She has no rales.  Abdominal: Soft. Bowel sounds are normal. She exhibits no distension. There is no tenderness. There is no rebound and no guarding.  Genitourinary:  Genitourinary Comments: Continent   Musculoskeletal: She exhibits no tenderness.  Unsteady gait spends most time on wheelchair and self propel.bilateral lower extremities trace edema Ted hose in place.   Lymphadenopathy:    She has no cervical adenopathy.  Neurological: Coordination normal.  Pleasantly confused at her baseline.   Skin: Skin is warm and dry. No rash noted. No erythema.  1.  Left heel previous ulcer site scab noted tender to touch but surrounding skin tissue without any redness,drainage  or swelling.  2. Right leg anterior and posterior skin tear without any signs of infections.   Psychiatric: She has a normal mood and affect.    Labs reviewed: Recent Labs    09/18/16 2232 09/18/16 2239 09/26/16 11/04/16 12/12/16  NA 137 136 139 142 139  139  K 4.6 4.5 4.5 3.6 4.0  4.0  CL 99* 96*  --   --  103  CO2 33*  --   --   --  28  GLUCOSE 115* 110*  --   --   --   BUN 24* 30* 16 14 24*  24*  CREATININE 1.06* 1.00 0.9 0.9 0.9  0.93  CALCIUM 9.4  --   --   --  9.4   Recent Labs    05/16/16 12/12/16  AST 16 17  17   ALT 7 8  8   ALKPHOS 70 79  79  BILITOT  --  0.5  PROT  --  6.6  ALBUMIN  --  3.2   Recent Labs    09/18/16 2232  09/26/16 12/12/16  WBC 11.9*  --  10.5 9.8  9.8  NEUTROABS 8.4*  --   --   --   HGB 13.0   < > 13.2 13.6  13.6  HCT 39.5   < > 40 42  42  MCV 99.7  --   --   --   PLT 358  --  447* 350   < > = values in this interval not displayed.   Lab Results  Component Value Date   TSH 1.67 11/07/2016   Lab Results  Component Value Date   HGBA1C 5.8 12/16/2016   Lab Results  Component Value Date   CHOL 174 12/16/2016   CHOL 174 12/16/2016   HDL 68 12/16/2016   HDL 68 12/16/2016   LDLCALC 80 12/16/2016   LDLCALC 80 12/16/2016   TRIG 159 12/16/2016    Significant Diagnostic Results in last 30 days:  No results found.  Assessment/Plan 1. Pressure injury of left heel, unstageable Afebrile.Scab noted.tender to touch but surrounding skin tissue without any redness,drainage  or swelling.continue to apply foam dressing for extra protection to prevent scab from reopening.continue to wear offloading boots.notify provider if worsening or not resolved.  2. Noninfected skin tear of right lower extremity Easily bruises.Right leg anterior and posterior skin tear without any signs of infections.continue to cleanse skin tear with  saline,pat dry and cover with non adhesive dressing.   3. Essential hypertension B/p stable.continue on metoprolol 12.5 mg tablet twice daily and Furosemide 20 mg tablet daily.continue to monitor.   4. PVD (peripheral vascular disease) Bilateral lower extremities Ted hose in place.Left heel ulcer scab noted with wound care in progress.continue on furosemide 20 mg tablet daily.monitor.    5. Alzheimer's dementia with behavioral disturbance Yelling " Help" has improved but not completely resolved. Continue on Mirtazapine 15 mg tablet daily,Seroquel 25 mg tablet twice daily and  Namenda 10 mg tablet twice daily.she has required Ativan twice daily.will change Ativan 0.5 mg Tablet to one by mouth twice daily.Continue to encourage to participate in facility activities.Continue to monitor.    Family/ staff Communication: Reviewed plan of care with patient and facility Nurse.   Labs/tests ordered: None   Buffie Herne C Teofilo Lupinacci, NP

## 2017-03-31 DIAGNOSIS — R6889 Other general symptoms and signs: Secondary | ICD-10-CM | POA: Diagnosis not present

## 2017-04-04 ENCOUNTER — Encounter: Payer: Self-pay | Admitting: Family

## 2017-04-04 ENCOUNTER — Non-Acute Institutional Stay (SKILLED_NURSING_FACILITY): Payer: Medicare Other | Admitting: Family

## 2017-04-04 DIAGNOSIS — S51011A Laceration without foreign body of right elbow, initial encounter: Secondary | ICD-10-CM

## 2017-04-04 DIAGNOSIS — S81812A Laceration without foreign body, left lower leg, initial encounter: Secondary | ICD-10-CM | POA: Diagnosis not present

## 2017-04-04 NOTE — Progress Notes (Signed)
Location:  [facility] Room Number: 32 Place of Service:  SNF (31) Provider: Dinah Ngetich FNP-C  Blanchie Serve, MD  Patient Care Team: Blanchie Serve, MD as PCP - General (Internal Medicine) Jettie Booze, MD as Consulting Physician (Cardiology) Marchia Bond, MD as Consulting Physician (Orthopedic Surgery) Kathrynn Ducking, MD as Consulting Physician (Neurology) Brand Males, MD as Consulting Physician (Pulmonary Disease) Ngetich, Nelda Bucks, NP as Nurse Practitioner (Family Medicine)  Extended Emergency Contact Information Primary Emergency Contact: [place] of [place] Phone: [phone] Relation: Son Secondary Emergency Contact: Ricardo [place]o [place] of [place] Phone: [phone] Mobile Phone: [phone] Relation: Daughter  Code Status:  DNR Goals of care: Advanced Directive information Advanced Directives 04/04/2017  Does Patient Have a Medical Advance Directive? Yes  Type of Paramedic of [place];Out of facility DNR (pink MOST or yellow form);Living will  Does patient want to make changes to medical advance directive? -  Copy of [place] in Chart? Yes  Pre-existing out of facility DNR order (yellow form or pink MOST form) Yellow form placed in chart (order not valid for inpatient use)     Chief Complaint  Patient presents with  . Acute Visit    skin tear to leg    HPI:  Pt is a 84 y.o. female seen today at [facility] for an acute visit for evaluation of left leg skin tear.she is seen in her room today per facility Nurse request.Nurse reports patient sustained a left leg skin tear while being transferred by CNA from toilet to wheelchair.Skin tear cleansed with saline,edges were approximated,steri strips applied and covered with non adhesive gauze per standing orders.Patient denies any fever, chills or drainage from skin tear site.Also has a  skin tear to right elbow. Patient prone to easily bruising. Wears Geri-sleeves to lower extremities.No recent fall episode reported.     Past Medical History:  Diagnosis Date  . Alzheimer's disease 06/22/2015  . Anemia    iron deficient  . Anemia   . Anxiety   . Chronic airway obstruction, not elsewhere classified   . Chronic back pain   . Chronic cystitis   . Cystitis, chronic    Dr Gaynelle Arabian  . Degenerative arthritis   . Depression   . Disturbance of skin sensation   . Disturbance of skin sensation   . Dyslipidemia   . Dyspnea    Dr Chase Caller  . Esophageal reflux   . Essential and other specified forms of tremor 12/11/2012  . Foot drop, bilateral 12/21/2013  . Gait disorder   . GERD (gastroesophageal reflux disease)   . Hemorrhoids   . Hereditary and idiopathic peripheral neuropathy 10/11/2015  . Hernia   . History of cerebrovascular disease    Moderate level small vessel disease  . Hyperlipidemia    mild  . Hypertension   . Osteoarthritis    right knee injection per Dr Trudie Reed  . Osteoporosis   . Other abnormal clinical finding   . Pain in joint, upper arm   . Peripheral vascular disease ([place])   . Rheumatoid arthritis ([place])    Dr Trudie Reed  . Tobacco use disorder   . Tremor    and gait disorder--Dr Jannifer Franklin  . Unspecified vitamin D deficiency    Past Surgical History:  Procedure Laterality Date  . APPENDECTOMY    . BACK SURGERY Bilateral    X2  . Bladder resuspension procedure    . CATARACT EXTRACTION  Bilateral   . FEMUR IM NAIL Right 02/22/2014   Procedure: INTRAMEDULLARY (IM) NAIL FEMORAL;  Surgeon: Johnny Bridge, MD;  Location: [facility];  Service: Orthopedics;  Laterality: Right;  . INGUINAL HERNIA REPAIR Bilateral   . ORIF ANKLE FRACTURE Left 09/19/2016   Procedure: OPEN REDUCTION INTERNAL FIXATION (ORIF) ANKLE FRACTURE;  Surgeon: Vickey Huger, MD;  Location: [facility];  Service: Orthopedics;  Laterality: Left;  . TONSILLECTOMY      Allergies  Allergen Reactions    . Novocain [Procaine]     Passed  out  . Limbrel [Flavocoxid]     Dizziness   . Sertraline Anxiety    Outpatient Encounter Medications as of 04/04/2017  Medication Sig  . acetaminophen (TYLENOL) 650 MG CR tablet Take 650 mg by mouth 2 (two) times daily.   Marland Kitchen alum & mag hydroxide-simeth (MAALOX/MYLANTA) 200-200-20 MG/5ML suspension Take 30 mLs by mouth every 4 (four) hours as needed for indigestion or heartburn.  . Amino Acids-Protein Hydrolys (FEEDING SUPPLEMENT, PRO-STAT SUGAR FREE 64,) LIQD Take 30 mLs by mouth 2 (two) times daily.   . Calcium Carbonate-Vitamin D (CALTRATE 600+D) 600-400 MG-UNIT per tablet Take 1 tablet by mouth daily.  Marland Kitchen donepezil (ARICEPT) 5 MG tablet Take 5 mg by mouth at bedtime.  . furosemide (LASIX) 20 MG tablet Take 20 mg by mouth daily.  Marland Kitchen Hyoscyamine Sulfate SL (LEVSIN/SL) 0.125 MG SUBL Place under the tongue every 12 (twelve) hours as needed (for cramps).   . LORazepam (ATIVAN) 0.5 MG tablet Take 0.5 mg by mouth 2 (two) times daily as needed for anxiety.  . memantine (NAMENDA) 10 MG tablet Take 10 mg by mouth 2 (two) times daily.  . metoprolol tartrate (LOPRESSOR) 25 MG tablet Take 12.5 mg by mouth 2 (two) times daily. Hold for SBP <110 or HR <60/min  . mirtazapine (REMERON) 15 MG tablet Take 15 mg by mouth at bedtime.  . Misc Natural Products (OSTEO BI-FLEX TRIPLE STRENGTH) TABS Take 1 tablet by mouth daily.  . Multiple Vitamin (MULTIVITAMIN) tablet Take 1 tablet by mouth daily.  . Nutritional Supplements (BENECALORIE PO) Take 1 oz by mouth daily.  . Nutritional Supplements (RESOURCE 2.0) LIQD Take 120 mLs by mouth 3 (three) times daily.   Marland Kitchen omeprazole (PRILOSEC) 20 MG capsule Take 20 mg by mouth daily.  . ondansetron (ZOFRAN) 4 MG tablet Take 4 mg by mouth every 6 (six) hours as needed for nausea or vomiting.  Marland Kitchen QUEtiapine (SEROQUEL) 25 MG tablet Take 25 mg by mouth 2 (two) times daily. At 9AM and 1PM  . ranitidine (ZANTAC) 150 MG tablet Take 150 mg by mouth  at bedtime.   . sennosides-docusate sodium (SENOKOT-S) 8.6-50 MG tablet Take 2 tablets by mouth daily.   No facility-administered encounter medications on file as of 04/04/2017.     Review of Systems  Constitutional: Negative for chills, fatigue and fever.  Respiratory: Negative for cough, chest tightness, shortness of breath and wheezing.   Cardiovascular: Negative for chest pain, palpitations and leg swelling.  Gastrointestinal: Negative for abdominal distention, abdominal pain and constipation.  Musculoskeletal: Positive for gait problem.  Skin: Negative for color change and pallor.       skin tear to right elbow and left leg dressing per standing orders.   Hematological: Bruises/bleeds easily.  Psychiatric/Behavioral: Positive for confusion. Negative for sleep disturbance.    Immunization History  Administered Date(s) Administered  . Influenza Whole 01/16/2010  . Influenza-Unspecified 02/03/2006, 01/09/2007, 01/05/2013, 12/22/2014, 12/28/2015, 12/26/2016  . PPD  Test 05/01/2004, 02/24/2014  . Pneumococcal Polysaccharide-23 01/21/2006  . Pneumococcal-Unspecified 12/19/1998  . Td 03/18/1998  . Tdap 09/18/2016   Pertinent  Health Maintenance Due  Topic Date Due  . PNA vac Low Risk Adult (2 of 2 - PCV13) 01/22/2007  . INFLUENZA VACCINE  Completed  . DEXA SCAN  Completed   Fall Risk  10/28/2016 06/20/2015  Falls in the past year? No No  Risk for fall due to : - Impaired balance/gait    Vitals:   04/04/17 1059  BP: 122/68  Pulse: 85  Resp: 18  Temp: 97.9 F (36.6 C)  SpO2: 98%  Weight: 117 lb (53.1 kg)  Height: 5\' 1"  (1.549 m)   Body mass index is 22.11 kg/m. Physical Exam  Constitutional: She appears well-developed.  Elderly in no acute distress  HENT:  Head: Normocephalic.  Mouth/Throat: Oropharynx is clear and moist. No oropharyngeal exudate.  Eyes: Conjunctivae and EOM are normal. Pupils are equal, round, and reactive to light. Right eye exhibits no discharge.  Left eye exhibits no discharge. No scleral icterus.  Cardiovascular: Normal rate, regular rhythm, normal heart sounds and intact distal pulses. Exam reveals no gallop and no friction rub.  No murmur heard. Pulmonary/Chest: Effort normal and breath sounds normal. No respiratory distress. She has no wheezes. She has no rales.  Abdominal: Soft. Bowel sounds are normal. She exhibits no distension. There is no tenderness. There is no rebound and no guarding.  Neurological: She is alert.  Pleasantly confused at her baseline   Skin: Skin is warm and dry. No rash noted. No erythema.  1. Right elbow skin tear red without any signs of infections. Small amounts of old dry blood on dressing noted.   2. Left upper lateral leg skin tear with steri-strips intact small amounts of old blood noted on dressing.   Psychiatric: She has a normal mood and affect.   Labs reviewed: Recent Labs    09/18/16 2232 09/18/16 2239 09/26/16 11/04/16 12/12/16  NA 137 136 139 142 139  139  K 4.6 4.5 4.5 3.6 4.0  4.0  CL 99* 96*  --   --  103  CO2 33*  --   --   --  28  GLUCOSE 115* 110*  --   --   --   BUN 24* 30* 16 14 24*  24*  CREATININE 1.06* 1.00 0.9 0.9 0.9  0.93  CALCIUM 9.4  --   --   --  9.4   Recent Labs    05/16/16 12/12/16  AST 16 17  17   ALT 7 8  8   ALKPHOS 70 79  79  BILITOT  --  0.5  PROT  --  6.6  ALBUMIN  --  3.2   Recent Labs    09/18/16 2232  09/26/16 12/12/16  WBC 11.9*  --  10.5 9.8  9.8  NEUTROABS 8.4*  --   --   --   HGB 13.0   < > 13.2 13.6  13.6  HCT 39.5   < > 40 42  42  MCV 99.7  --   --   --   PLT 358  --  447* 350   < > = values in this interval not displayed.   Lab Results  Component Value Date   TSH 1.67 11/07/2016   Lab Results  Component Value Date   HGBA1C 5.8 12/16/2016   Lab Results  Component Value Date   CHOL 174 12/16/2016   CHOL  174 12/16/2016   HDL 68 12/16/2016   HDL 68 12/16/2016   LDLCALC 80 12/16/2016   LDLCALC 80 12/16/2016   TRIG  159 12/16/2016    Significant Diagnostic Results in last 30 days:  No results found.  Assessment/Plan 1. Noninfected skin tear of left lower extremity, initial encounter Afebrile.Left upper lateral leg skin tear with steri-strips intact small amounts of old blood noted on dressing.continue current dressing changes.continue to monitor for any signs of infections.Geri-sleeves daily.   2. Skin tear of right elbow without complication, initial encounter Right elbow skin tear red without any signs of infections. Small amounts of old dry blood on dressing noted.continue to cleanse with saline, pat dry,TAO and cover with non adhesive gauze. Monitor for signs of infections.  Family/ staff Communication: Reviewed plan of care with patient and facility Nurse  Labs/tests ordered: None   Nelda Bucks Ngetich, NP

## 2017-04-17 ENCOUNTER — Non-Acute Institutional Stay (SKILLED_NURSING_FACILITY): Payer: Medicare Other | Admitting: Internal Medicine

## 2017-04-17 ENCOUNTER — Encounter: Payer: Self-pay | Admitting: Internal Medicine

## 2017-04-17 DIAGNOSIS — R278 Other lack of coordination: Secondary | ICD-10-CM | POA: Diagnosis not present

## 2017-04-17 DIAGNOSIS — M25562 Pain in left knee: Secondary | ICD-10-CM | POA: Diagnosis not present

## 2017-04-17 DIAGNOSIS — I10 Essential (primary) hypertension: Secondary | ICD-10-CM

## 2017-04-17 DIAGNOSIS — K648 Other hemorrhoids: Secondary | ICD-10-CM | POA: Diagnosis not present

## 2017-04-17 DIAGNOSIS — Z9181 History of falling: Secondary | ICD-10-CM | POA: Diagnosis not present

## 2017-04-17 DIAGNOSIS — H10023 Other mucopurulent conjunctivitis, bilateral: Secondary | ICD-10-CM | POA: Diagnosis not present

## 2017-04-17 DIAGNOSIS — G309 Alzheimer's disease, unspecified: Secondary | ICD-10-CM

## 2017-04-17 DIAGNOSIS — G250 Essential tremor: Secondary | ICD-10-CM | POA: Diagnosis not present

## 2017-04-17 DIAGNOSIS — K219 Gastro-esophageal reflux disease without esophagitis: Secondary | ICD-10-CM

## 2017-04-17 DIAGNOSIS — R262 Difficulty in walking, not elsewhere classified: Secondary | ICD-10-CM | POA: Diagnosis not present

## 2017-04-17 DIAGNOSIS — S82892S Other fracture of left lower leg, sequela: Secondary | ICD-10-CM | POA: Diagnosis not present

## 2017-04-17 DIAGNOSIS — M6281 Muscle weakness (generalized): Secondary | ICD-10-CM | POA: Diagnosis not present

## 2017-04-17 DIAGNOSIS — F329 Major depressive disorder, single episode, unspecified: Secondary | ICD-10-CM | POA: Diagnosis not present

## 2017-04-17 DIAGNOSIS — I679 Cerebrovascular disease, unspecified: Secondary | ICD-10-CM | POA: Diagnosis not present

## 2017-04-17 DIAGNOSIS — Z4789 Encounter for other orthopedic aftercare: Secondary | ICD-10-CM | POA: Diagnosis not present

## 2017-04-17 DIAGNOSIS — G609 Hereditary and idiopathic neuropathy, unspecified: Secondary | ICD-10-CM | POA: Diagnosis not present

## 2017-04-17 DIAGNOSIS — R609 Edema, unspecified: Secondary | ICD-10-CM | POA: Diagnosis not present

## 2017-04-17 DIAGNOSIS — F0281 Dementia in other diseases classified elsewhere with behavioral disturbance: Secondary | ICD-10-CM | POA: Diagnosis not present

## 2017-04-17 DIAGNOSIS — R5381 Other malaise: Secondary | ICD-10-CM

## 2017-04-17 DIAGNOSIS — R29898 Other symptoms and signs involving the musculoskeletal system: Secondary | ICD-10-CM | POA: Diagnosis not present

## 2017-04-17 DIAGNOSIS — M4800 Spinal stenosis, site unspecified: Secondary | ICD-10-CM | POA: Diagnosis not present

## 2017-04-17 DIAGNOSIS — M21371 Foot drop, right foot: Secondary | ICD-10-CM | POA: Diagnosis not present

## 2017-04-17 DIAGNOSIS — M21372 Foot drop, left foot: Secondary | ICD-10-CM | POA: Diagnosis not present

## 2017-04-17 DIAGNOSIS — J984 Other disorders of lung: Secondary | ICD-10-CM | POA: Diagnosis not present

## 2017-04-17 DIAGNOSIS — K5900 Constipation, unspecified: Secondary | ICD-10-CM | POA: Diagnosis not present

## 2017-04-17 DIAGNOSIS — R2681 Unsteadiness on feet: Secondary | ICD-10-CM | POA: Diagnosis not present

## 2017-04-17 DIAGNOSIS — I2789 Other specified pulmonary heart diseases: Secondary | ICD-10-CM | POA: Diagnosis not present

## 2017-04-17 NOTE — Progress Notes (Signed)
Location:  [facility] Room Number: 32 Place of Service:  SNF [phone]) Provider:  Blanchie Serve MD  Blanchie Serve, MD  Patient Care Team: Blanchie Serve, MD as PCP - General (Internal Medicine) Jettie Booze, MD as Consulting Physician (Cardiology) Marchia Bond, MD as Consulting Physician (Orthopedic Surgery) Kathrynn Ducking, MD as Consulting Physician (Neurology) Brand Males, MD as Consulting Physician (Pulmonary Disease) Ngetich, Nelda Bucks, NP as Nurse Practitioner (Family Medicine)  Extended Emergency Contact Information Primary Emergency Contact: [place] of [place] Phone: [phone] Relation: Son Secondary Emergency Contact: Ricardo [place] of [place] Phone: [phone] Mobile Phone: [phone] Relation: Daughter  Code Status:  DNR  Goals of care: Advanced Directive information Advanced Directives 04/17/2017  Does Patient Have a Medical Advance Directive? Yes  Type of Paramedic of [place];Living will;Out of facility DNR (pink MOST or yellow form)  Does patient want to make changes to medical advance directive? No - Patient declined  Copy of [place] in Chart? Yes  Pre-existing out of facility DNR order (yellow form or pink MOST form) Yellow form placed in chart (order not valid for inpatient use)     Chief Complaint  Patient presents with  . Medical Management of Chronic Issues    Routine Visit     HPI:  Pt is a 84 y.o. female seen today for medical management of chronic diseases. She is pleasantly confused and says "help help" several times. When asked what help is needed, she mentions that she needs time to think. Does not appear in distress.she does not participate in HPI and ROS with her dementia. She has been seen by Physical therapy team today with her ongoing decline with her ADLs. She required more assistance from CNA for transfers.  Behavior has been overall stable with current medications. unstageable pressure ulcer to left heel has healed.    Past Medical History:  Diagnosis Date  . Alzheimer's disease 06/22/2015  . Anemia    iron deficient  . Anemia   . Anxiety   . Chronic airway obstruction, not elsewhere classified   . Chronic back pain   . Chronic cystitis   . Cystitis, chronic    Dr Gaynelle Arabian  . Degenerative arthritis   . Depression   . Disturbance of skin sensation   . Disturbance of skin sensation   . Dyslipidemia   . Dyspnea    Dr Chase Caller  . Esophageal reflux   . Essential and other specified forms of tremor 12/11/2012  . Foot drop, bilateral 12/21/2013  . Gait disorder   . GERD (gastroesophageal reflux disease)   . Hemorrhoids   . Hereditary and idiopathic peripheral neuropathy 10/11/2015  . Hernia   . History of cerebrovascular disease    Moderate level small vessel disease  . Hyperlipidemia    mild  . Hypertension   . Osteoarthritis    right knee injection per Dr Trudie Reed  . Osteoporosis   . Other abnormal clinical finding   . Pain in joint, upper arm   . Peripheral vascular disease ([place])   . Rheumatoid arthritis ([place])    Dr Trudie Reed  . Tobacco use disorder   . Tremor    and gait disorder--Dr Jannifer Franklin  . Unspecified vitamin D deficiency    Past Surgical History:  Procedure Laterality Date  . APPENDECTOMY    . BACK SURGERY Bilateral    X2  . Bladder resuspension procedure    . CATARACT  EXTRACTION Bilateral   . FEMUR IM NAIL Right 02/22/2014   Procedure: INTRAMEDULLARY (IM) NAIL FEMORAL;  Surgeon: Johnny Bridge, MD;  Location: [place] [facility];  Service: Orthopedics;  Laterality: Right;  . INGUINAL HERNIA REPAIR Bilateral   . ORIF ANKLE FRACTURE Left 09/19/2016   Procedure: OPEN REDUCTION INTERNAL FIXATION (ORIF) ANKLE FRACTURE;  Surgeon: Vickey Huger, MD;  Location: [facility];  Service: Orthopedics;  Laterality: Left;  . TONSILLECTOMY      Allergies  Allergen Reactions  . Novocain [Procaine]      Passed  out  . Limbrel [Flavocoxid]     Dizziness   . Sertraline Anxiety    Outpatient Encounter Medications as of 04/17/2017  Medication Sig  . acetaminophen (TYLENOL) 650 MG CR tablet Take 650 mg by mouth 2 (two) times daily.   Marland Kitchen alum & mag hydroxide-simeth (MAALOX/MYLANTA) 200-200-20 MG/5ML suspension Take 30 mLs by mouth every 4 (four) hours as needed for indigestion or heartburn.  . Amino Acids-Protein Hydrolys (FEEDING SUPPLEMENT, PRO-STAT SUGAR FREE 64,) LIQD Take 30 mLs by mouth 2 (two) times daily.   . Calcium Carbonate-Vitamin D (CALTRATE 600+D) 600-400 MG-UNIT per tablet Take 1 tablet by mouth daily.  Marland Kitchen donepezil (ARICEPT) 5 MG tablet Take 5 mg by mouth at bedtime.  . furosemide (LASIX) 20 MG tablet Take 20 mg by mouth daily.  Marland Kitchen Hyoscyamine Sulfate SL (LEVSIN/SL) 0.125 MG SUBL Place under the tongue every 12 (twelve) hours as needed (for cramps).   . LORazepam (ATIVAN) 0.5 MG tablet Take 0.5 mg by mouth 2 (two) times daily as needed for anxiety.  . memantine (NAMENDA) 10 MG tablet Take 10 mg by mouth 2 (two) times daily.  . metoprolol tartrate (LOPRESSOR) 25 MG tablet Take 12.5 mg by mouth 2 (two) times daily. Hold for SBP <110 or HR <60/min  . mirtazapine (REMERON) 15 MG tablet Take 15 mg by mouth at bedtime.  . Misc Natural Products (OSTEO BI-FLEX TRIPLE STRENGTH) TABS Take 1 tablet by mouth daily.  . Multiple Vitamin (MULTIVITAMIN) tablet Take 1 tablet by mouth daily.  . Nutritional Supplements (BENECALORIE PO) Take 1 oz by mouth daily.  Marland Kitchen omeprazole (PRILOSEC) 20 MG capsule Take 20 mg by mouth daily.  . ondansetron (ZOFRAN) 4 MG tablet Take 4 mg by mouth every 6 (six) hours as needed for nausea or vomiting.  Marland Kitchen QUEtiapine (SEROQUEL) 25 MG tablet Take 25 mg by mouth 2 (two) times daily. At 9AM and 1PM  . ranitidine (ZANTAC) 150 MG tablet Take 150 mg by mouth at bedtime.   . sennosides-docusate sodium (SENOKOT-S) 8.6-50 MG tablet Take 2 tablets by mouth daily.  .  [DISCONTINUED] Nutritional Supplements (RESOURCE 2.0) LIQD Take 120 mLs by mouth 3 (three) times daily.    No facility-administered encounter medications on file as of 04/17/2017.     Review of Systems  Immunization History  Administered Date(s) Administered  . Influenza Whole 01/16/2010  . Influenza-Unspecified 02/03/2006, 01/09/2007, 01/05/2013, 12/22/2014, 12/28/2015, 12/26/2016  . PPD Test 05/01/2004, 02/24/2014  . Pneumococcal Polysaccharide-23 01/21/2006  . Pneumococcal-Unspecified 12/19/1998  . Td 03/18/1998  . Tdap 09/18/2016   Pertinent  Health Maintenance Due  Topic Date Due  . PNA vac Low Risk Adult (2 of 2 - PCV13) 01/22/2007  . INFLUENZA VACCINE  Completed  . DEXA SCAN  Completed   Fall Risk  10/28/2016 06/20/2015  Falls in the past year? No No  Risk for fall due to : - Impaired balance/gait   Functional Status Survey:  Vitals:   04/17/17 1424  BP: 136/88  Pulse: 88  Resp: 18  Temp: (!) 97.2 F (36.2 C)  TempSrc: Oral  SpO2: 98%  Weight: 117 lb 9.6 oz (53.3 kg)  Height: 5\' 1"  (1.549 m)   Body mass index is 22.22 kg/m.   Wt Readings from Last 3 Encounters:  04/17/17 117 lb 9.6 oz (53.3 kg)  04/04/17 117 lb (53.1 kg)  03/14/17 114 lb 12.8 oz (52.1 kg)   Physical Exam  Constitutional: She appears well-developed and well-nourished. No distress.  HENT:  Head: Normocephalic and atraumatic.  Mouth/Throat: Oropharynx is clear and moist.  Eyes: Conjunctivae are normal. Pupils are equal, round, and reactive to light.  Neck: Neck supple.  Cardiovascular: Normal rate and regular rhythm.  Pulmonary/Chest: Effort normal and breath sounds normal. No respiratory distress. She has no wheezes. She has no rales.  Abdominal: There is no tenderness.  Musculoskeletal: She exhibits edema.  Unsteady gait, walking with walker with gait belt support otherwise using wheelchair and is able to self propel  Lymphadenopathy:    She has no cervical adenopathy.    Neurological:  Pleasantly confused, alert but oriented only to self  Skin: Skin is warm and dry. She is not diaphoretic.  unstageable pressure ulcer ot left heel has healed  Psychiatric:  Pleasantly confused    Labs reviewed: Recent Labs    09/18/16 2232 09/18/16 2239 09/26/16 11/04/16 12/12/16  NA 137 136 139 142 139  139  K 4.6 4.5 4.5 3.6 4.0  4.0  CL 99* 96*  --   --  103  CO2 33*  --   --   --  28  GLUCOSE 115* 110*  --   --   --   BUN 24* 30* 16 14 24*  24*  CREATININE 1.06* 1.00 0.9 0.9 0.9  0.93  CALCIUM 9.4  --   --   --  9.4   Recent Labs    05/16/16 12/12/16  AST 16 17  17   ALT 7 8  8   ALKPHOS 70 79  79  BILITOT  --  0.5  PROT  --  6.6  ALBUMIN  --  3.2   Recent Labs    09/18/16 2232  09/26/16 12/12/16  WBC 11.9*  --  10.5 9.8  9.8  NEUTROABS 8.4*  --   --   --   HGB 13.0   < > 13.2 13.6  13.6  HCT 39.5   < > 40 42  42  MCV 99.7  --   --   --   PLT 358  --  447* 350   < > = values in this interval not displayed.   Lab Results  Component Value Date   TSH 1.67 11/07/2016   Lab Results  Component Value Date   HGBA1C 5.8 12/16/2016   Lab Results  Component Value Date   CHOL 174 12/16/2016   CHOL 174 12/16/2016   HDL 68 12/16/2016   HDL 68 12/16/2016   LDLCALC 80 12/16/2016   LDLCALC 80 12/16/2016   TRIG 159 12/16/2016    Significant Diagnostic Results in last 30 days:  No results found.  Assessment/Plan  Physical deconditioning Will have her work with physical therapy and occupational therapy team to help with gait training and muscle strengthening exercises.fall precautions. Skin care. Encourage to be out of bed. Continue protein supplements.   Dementia with behavioral disturbance Continue donepezil 5 mg qhs with memantine 10 mg bid and monitor,  supportive care, continue quetiapine 25 mg bid  gerd Continue omeprazole 20 mg daily with ranitidine 150 mg daily and monitor  Essential hypertension SBP highest 148 and lowest  97. No fall reported. Continue metoprolol tartrate 12.5 mg bid, ted hose and furosemide 20 mg daily. Check bmp  unstageable left heel pressure ulcer Healed. Continue skin prep and off loading boot for preventative treatment.    Family/ staff Communication: reviewed care plan with patient and charge nurse.    Labs/tests ordered:  Cbc, bmp   Blanchie Serve, MD Internal Medicine [facility] [address] [place], [place] [address] Cell Phone (Monday-Friday 8 am - 5 pm): [phone] On Call: [phone] and follow prompts after 5 pm and on weekends Office Phone: [phone] Office Fax: [phone]

## 2017-04-18 DIAGNOSIS — J984 Other disorders of lung: Secondary | ICD-10-CM | POA: Diagnosis not present

## 2017-04-18 DIAGNOSIS — Z9181 History of falling: Secondary | ICD-10-CM | POA: Diagnosis not present

## 2017-04-18 DIAGNOSIS — R29898 Other symptoms and signs involving the musculoskeletal system: Secondary | ICD-10-CM | POA: Diagnosis not present

## 2017-04-18 DIAGNOSIS — M6281 Muscle weakness (generalized): Secondary | ICD-10-CM | POA: Diagnosis not present

## 2017-04-18 DIAGNOSIS — R278 Other lack of coordination: Secondary | ICD-10-CM | POA: Diagnosis not present

## 2017-04-18 DIAGNOSIS — I679 Cerebrovascular disease, unspecified: Secondary | ICD-10-CM | POA: Diagnosis not present

## 2017-04-18 DIAGNOSIS — I2789 Other specified pulmonary heart diseases: Secondary | ICD-10-CM | POA: Diagnosis not present

## 2017-04-18 DIAGNOSIS — R262 Difficulty in walking, not elsewhere classified: Secondary | ICD-10-CM | POA: Diagnosis not present

## 2017-04-18 DIAGNOSIS — G609 Hereditary and idiopathic neuropathy, unspecified: Secondary | ICD-10-CM | POA: Diagnosis not present

## 2017-04-18 DIAGNOSIS — F329 Major depressive disorder, single episode, unspecified: Secondary | ICD-10-CM | POA: Diagnosis not present

## 2017-04-18 DIAGNOSIS — I10 Essential (primary) hypertension: Secondary | ICD-10-CM | POA: Diagnosis not present

## 2017-04-18 DIAGNOSIS — K648 Other hemorrhoids: Secondary | ICD-10-CM | POA: Diagnosis not present

## 2017-04-18 DIAGNOSIS — M21372 Foot drop, left foot: Secondary | ICD-10-CM | POA: Diagnosis not present

## 2017-04-18 DIAGNOSIS — G309 Alzheimer's disease, unspecified: Secondary | ICD-10-CM | POA: Diagnosis not present

## 2017-04-18 DIAGNOSIS — M4800 Spinal stenosis, site unspecified: Secondary | ICD-10-CM | POA: Diagnosis not present

## 2017-04-18 DIAGNOSIS — M21371 Foot drop, right foot: Secondary | ICD-10-CM | POA: Diagnosis not present

## 2017-04-18 DIAGNOSIS — G250 Essential tremor: Secondary | ICD-10-CM | POA: Diagnosis not present

## 2017-04-18 DIAGNOSIS — M25562 Pain in left knee: Secondary | ICD-10-CM | POA: Diagnosis not present

## 2017-04-18 DIAGNOSIS — Z7409 Other reduced mobility: Secondary | ICD-10-CM | POA: Diagnosis not present

## 2017-04-18 DIAGNOSIS — R609 Edema, unspecified: Secondary | ICD-10-CM | POA: Diagnosis not present

## 2017-04-18 DIAGNOSIS — R4789 Other speech disturbances: Secondary | ICD-10-CM | POA: Diagnosis not present

## 2017-04-18 DIAGNOSIS — K219 Gastro-esophageal reflux disease without esophagitis: Secondary | ICD-10-CM | POA: Diagnosis not present

## 2017-04-18 DIAGNOSIS — H10023 Other mucopurulent conjunctivitis, bilateral: Secondary | ICD-10-CM | POA: Diagnosis not present

## 2017-04-18 DIAGNOSIS — R2681 Unsteadiness on feet: Secondary | ICD-10-CM | POA: Diagnosis not present

## 2017-04-18 DIAGNOSIS — K5900 Constipation, unspecified: Secondary | ICD-10-CM | POA: Diagnosis not present

## 2017-04-21 DIAGNOSIS — F0281 Dementia in other diseases classified elsewhere with behavioral disturbance: Secondary | ICD-10-CM | POA: Diagnosis not present

## 2017-04-21 DIAGNOSIS — R29898 Other symptoms and signs involving the musculoskeletal system: Secondary | ICD-10-CM | POA: Diagnosis not present

## 2017-04-21 DIAGNOSIS — Z7409 Other reduced mobility: Secondary | ICD-10-CM | POA: Diagnosis not present

## 2017-04-21 DIAGNOSIS — M6281 Muscle weakness (generalized): Secondary | ICD-10-CM | POA: Diagnosis not present

## 2017-04-21 DIAGNOSIS — Z9181 History of falling: Secondary | ICD-10-CM | POA: Diagnosis not present

## 2017-04-21 DIAGNOSIS — R262 Difficulty in walking, not elsewhere classified: Secondary | ICD-10-CM | POA: Diagnosis not present

## 2017-04-21 DIAGNOSIS — R2681 Unsteadiness on feet: Secondary | ICD-10-CM | POA: Diagnosis not present

## 2017-04-21 DIAGNOSIS — I10 Essential (primary) hypertension: Secondary | ICD-10-CM | POA: Diagnosis not present

## 2017-04-22 DIAGNOSIS — R29898 Other symptoms and signs involving the musculoskeletal system: Secondary | ICD-10-CM | POA: Diagnosis not present

## 2017-04-22 DIAGNOSIS — M6281 Muscle weakness (generalized): Secondary | ICD-10-CM | POA: Diagnosis not present

## 2017-04-22 DIAGNOSIS — R262 Difficulty in walking, not elsewhere classified: Secondary | ICD-10-CM | POA: Diagnosis not present

## 2017-04-22 DIAGNOSIS — Z9181 History of falling: Secondary | ICD-10-CM | POA: Diagnosis not present

## 2017-04-22 DIAGNOSIS — Z7409 Other reduced mobility: Secondary | ICD-10-CM | POA: Diagnosis not present

## 2017-04-22 DIAGNOSIS — R2681 Unsteadiness on feet: Secondary | ICD-10-CM | POA: Diagnosis not present

## 2017-04-23 DIAGNOSIS — M6281 Muscle weakness (generalized): Secondary | ICD-10-CM | POA: Diagnosis not present

## 2017-04-23 DIAGNOSIS — Z9181 History of falling: Secondary | ICD-10-CM | POA: Diagnosis not present

## 2017-04-23 DIAGNOSIS — R262 Difficulty in walking, not elsewhere classified: Secondary | ICD-10-CM | POA: Diagnosis not present

## 2017-04-23 DIAGNOSIS — Z7409 Other reduced mobility: Secondary | ICD-10-CM | POA: Diagnosis not present

## 2017-04-23 DIAGNOSIS — R29898 Other symptoms and signs involving the musculoskeletal system: Secondary | ICD-10-CM | POA: Diagnosis not present

## 2017-04-23 DIAGNOSIS — R2681 Unsteadiness on feet: Secondary | ICD-10-CM | POA: Diagnosis not present

## 2017-04-24 DIAGNOSIS — R29898 Other symptoms and signs involving the musculoskeletal system: Secondary | ICD-10-CM | POA: Diagnosis not present

## 2017-04-24 DIAGNOSIS — R2681 Unsteadiness on feet: Secondary | ICD-10-CM | POA: Diagnosis not present

## 2017-04-24 DIAGNOSIS — R262 Difficulty in walking, not elsewhere classified: Secondary | ICD-10-CM | POA: Diagnosis not present

## 2017-04-24 DIAGNOSIS — M6281 Muscle weakness (generalized): Secondary | ICD-10-CM | POA: Diagnosis not present

## 2017-04-24 DIAGNOSIS — Z9181 History of falling: Secondary | ICD-10-CM | POA: Diagnosis not present

## 2017-04-24 DIAGNOSIS — Z7409 Other reduced mobility: Secondary | ICD-10-CM | POA: Diagnosis not present

## 2017-04-25 DIAGNOSIS — R29898 Other symptoms and signs involving the musculoskeletal system: Secondary | ICD-10-CM | POA: Diagnosis not present

## 2017-04-25 DIAGNOSIS — R262 Difficulty in walking, not elsewhere classified: Secondary | ICD-10-CM | POA: Diagnosis not present

## 2017-04-25 DIAGNOSIS — M6281 Muscle weakness (generalized): Secondary | ICD-10-CM | POA: Diagnosis not present

## 2017-04-25 DIAGNOSIS — Z9181 History of falling: Secondary | ICD-10-CM | POA: Diagnosis not present

## 2017-04-25 DIAGNOSIS — Z7409 Other reduced mobility: Secondary | ICD-10-CM | POA: Diagnosis not present

## 2017-04-25 DIAGNOSIS — R2681 Unsteadiness on feet: Secondary | ICD-10-CM | POA: Diagnosis not present

## 2017-04-28 DIAGNOSIS — M6281 Muscle weakness (generalized): Secondary | ICD-10-CM | POA: Diagnosis not present

## 2017-04-28 DIAGNOSIS — Z7409 Other reduced mobility: Secondary | ICD-10-CM | POA: Diagnosis not present

## 2017-04-28 DIAGNOSIS — R262 Difficulty in walking, not elsewhere classified: Secondary | ICD-10-CM | POA: Diagnosis not present

## 2017-04-28 DIAGNOSIS — R29898 Other symptoms and signs involving the musculoskeletal system: Secondary | ICD-10-CM | POA: Diagnosis not present

## 2017-04-28 DIAGNOSIS — R2681 Unsteadiness on feet: Secondary | ICD-10-CM | POA: Diagnosis not present

## 2017-04-28 DIAGNOSIS — Z9181 History of falling: Secondary | ICD-10-CM | POA: Diagnosis not present

## 2017-04-29 DIAGNOSIS — Z7409 Other reduced mobility: Secondary | ICD-10-CM | POA: Diagnosis not present

## 2017-04-29 DIAGNOSIS — R2681 Unsteadiness on feet: Secondary | ICD-10-CM | POA: Diagnosis not present

## 2017-04-29 DIAGNOSIS — M6281 Muscle weakness (generalized): Secondary | ICD-10-CM | POA: Diagnosis not present

## 2017-04-29 DIAGNOSIS — R262 Difficulty in walking, not elsewhere classified: Secondary | ICD-10-CM | POA: Diagnosis not present

## 2017-04-29 DIAGNOSIS — Z9181 History of falling: Secondary | ICD-10-CM | POA: Diagnosis not present

## 2017-04-29 DIAGNOSIS — R29898 Other symptoms and signs involving the musculoskeletal system: Secondary | ICD-10-CM | POA: Diagnosis not present

## 2017-04-30 DIAGNOSIS — R29898 Other symptoms and signs involving the musculoskeletal system: Secondary | ICD-10-CM | POA: Diagnosis not present

## 2017-04-30 DIAGNOSIS — R2681 Unsteadiness on feet: Secondary | ICD-10-CM | POA: Diagnosis not present

## 2017-04-30 DIAGNOSIS — Z9181 History of falling: Secondary | ICD-10-CM | POA: Diagnosis not present

## 2017-04-30 DIAGNOSIS — R262 Difficulty in walking, not elsewhere classified: Secondary | ICD-10-CM | POA: Diagnosis not present

## 2017-04-30 DIAGNOSIS — M6281 Muscle weakness (generalized): Secondary | ICD-10-CM | POA: Diagnosis not present

## 2017-04-30 DIAGNOSIS — Z7409 Other reduced mobility: Secondary | ICD-10-CM | POA: Diagnosis not present

## 2017-05-01 DIAGNOSIS — R2681 Unsteadiness on feet: Secondary | ICD-10-CM | POA: Diagnosis not present

## 2017-05-01 DIAGNOSIS — Z9181 History of falling: Secondary | ICD-10-CM | POA: Diagnosis not present

## 2017-05-01 DIAGNOSIS — R262 Difficulty in walking, not elsewhere classified: Secondary | ICD-10-CM | POA: Diagnosis not present

## 2017-05-01 DIAGNOSIS — M6281 Muscle weakness (generalized): Secondary | ICD-10-CM | POA: Diagnosis not present

## 2017-05-01 DIAGNOSIS — R29898 Other symptoms and signs involving the musculoskeletal system: Secondary | ICD-10-CM | POA: Diagnosis not present

## 2017-05-01 DIAGNOSIS — Z7409 Other reduced mobility: Secondary | ICD-10-CM | POA: Diagnosis not present

## 2017-05-02 DIAGNOSIS — Z7409 Other reduced mobility: Secondary | ICD-10-CM | POA: Diagnosis not present

## 2017-05-02 DIAGNOSIS — M6281 Muscle weakness (generalized): Secondary | ICD-10-CM | POA: Diagnosis not present

## 2017-05-02 DIAGNOSIS — R2681 Unsteadiness on feet: Secondary | ICD-10-CM | POA: Diagnosis not present

## 2017-05-02 DIAGNOSIS — R29898 Other symptoms and signs involving the musculoskeletal system: Secondary | ICD-10-CM | POA: Diagnosis not present

## 2017-05-02 DIAGNOSIS — R262 Difficulty in walking, not elsewhere classified: Secondary | ICD-10-CM | POA: Diagnosis not present

## 2017-05-02 DIAGNOSIS — Z9181 History of falling: Secondary | ICD-10-CM | POA: Diagnosis not present

## 2017-05-05 DIAGNOSIS — Z9181 History of falling: Secondary | ICD-10-CM | POA: Diagnosis not present

## 2017-05-05 DIAGNOSIS — Z7409 Other reduced mobility: Secondary | ICD-10-CM | POA: Diagnosis not present

## 2017-05-05 DIAGNOSIS — M6281 Muscle weakness (generalized): Secondary | ICD-10-CM | POA: Diagnosis not present

## 2017-05-05 DIAGNOSIS — R2681 Unsteadiness on feet: Secondary | ICD-10-CM | POA: Diagnosis not present

## 2017-05-05 DIAGNOSIS — R262 Difficulty in walking, not elsewhere classified: Secondary | ICD-10-CM | POA: Diagnosis not present

## 2017-05-05 DIAGNOSIS — R29898 Other symptoms and signs involving the musculoskeletal system: Secondary | ICD-10-CM | POA: Diagnosis not present

## 2017-05-06 DIAGNOSIS — R29898 Other symptoms and signs involving the musculoskeletal system: Secondary | ICD-10-CM | POA: Diagnosis not present

## 2017-05-06 DIAGNOSIS — Z9181 History of falling: Secondary | ICD-10-CM | POA: Diagnosis not present

## 2017-05-06 DIAGNOSIS — R2681 Unsteadiness on feet: Secondary | ICD-10-CM | POA: Diagnosis not present

## 2017-05-06 DIAGNOSIS — M6281 Muscle weakness (generalized): Secondary | ICD-10-CM | POA: Diagnosis not present

## 2017-05-06 DIAGNOSIS — Z7409 Other reduced mobility: Secondary | ICD-10-CM | POA: Diagnosis not present

## 2017-05-06 DIAGNOSIS — R262 Difficulty in walking, not elsewhere classified: Secondary | ICD-10-CM | POA: Diagnosis not present

## 2017-05-07 DIAGNOSIS — Z7409 Other reduced mobility: Secondary | ICD-10-CM | POA: Diagnosis not present

## 2017-05-07 DIAGNOSIS — M6281 Muscle weakness (generalized): Secondary | ICD-10-CM | POA: Diagnosis not present

## 2017-05-07 DIAGNOSIS — R262 Difficulty in walking, not elsewhere classified: Secondary | ICD-10-CM | POA: Diagnosis not present

## 2017-05-07 DIAGNOSIS — R2681 Unsteadiness on feet: Secondary | ICD-10-CM | POA: Diagnosis not present

## 2017-05-07 DIAGNOSIS — R29898 Other symptoms and signs involving the musculoskeletal system: Secondary | ICD-10-CM | POA: Diagnosis not present

## 2017-05-07 DIAGNOSIS — Z9181 History of falling: Secondary | ICD-10-CM | POA: Diagnosis not present

## 2017-05-08 DIAGNOSIS — Z9181 History of falling: Secondary | ICD-10-CM | POA: Diagnosis not present

## 2017-05-08 DIAGNOSIS — R2681 Unsteadiness on feet: Secondary | ICD-10-CM | POA: Diagnosis not present

## 2017-05-08 DIAGNOSIS — Z7409 Other reduced mobility: Secondary | ICD-10-CM | POA: Diagnosis not present

## 2017-05-08 DIAGNOSIS — R29898 Other symptoms and signs involving the musculoskeletal system: Secondary | ICD-10-CM | POA: Diagnosis not present

## 2017-05-08 DIAGNOSIS — R262 Difficulty in walking, not elsewhere classified: Secondary | ICD-10-CM | POA: Diagnosis not present

## 2017-05-08 DIAGNOSIS — M6281 Muscle weakness (generalized): Secondary | ICD-10-CM | POA: Diagnosis not present

## 2017-05-09 DIAGNOSIS — M6281 Muscle weakness (generalized): Secondary | ICD-10-CM | POA: Diagnosis not present

## 2017-05-09 DIAGNOSIS — Z7409 Other reduced mobility: Secondary | ICD-10-CM | POA: Diagnosis not present

## 2017-05-09 DIAGNOSIS — R29898 Other symptoms and signs involving the musculoskeletal system: Secondary | ICD-10-CM | POA: Diagnosis not present

## 2017-05-09 DIAGNOSIS — R2681 Unsteadiness on feet: Secondary | ICD-10-CM | POA: Diagnosis not present

## 2017-05-09 DIAGNOSIS — R262 Difficulty in walking, not elsewhere classified: Secondary | ICD-10-CM | POA: Diagnosis not present

## 2017-05-09 DIAGNOSIS — Z9181 History of falling: Secondary | ICD-10-CM | POA: Diagnosis not present

## 2017-05-12 DIAGNOSIS — Z7409 Other reduced mobility: Secondary | ICD-10-CM | POA: Diagnosis not present

## 2017-05-12 DIAGNOSIS — M6281 Muscle weakness (generalized): Secondary | ICD-10-CM | POA: Diagnosis not present

## 2017-05-12 DIAGNOSIS — R2681 Unsteadiness on feet: Secondary | ICD-10-CM | POA: Diagnosis not present

## 2017-05-12 DIAGNOSIS — Z9181 History of falling: Secondary | ICD-10-CM | POA: Diagnosis not present

## 2017-05-12 DIAGNOSIS — R262 Difficulty in walking, not elsewhere classified: Secondary | ICD-10-CM | POA: Diagnosis not present

## 2017-05-12 DIAGNOSIS — R29898 Other symptoms and signs involving the musculoskeletal system: Secondary | ICD-10-CM | POA: Diagnosis not present

## 2017-05-13 ENCOUNTER — Non-Acute Institutional Stay (SKILLED_NURSING_FACILITY): Payer: Medicare Other | Admitting: Family

## 2017-05-13 ENCOUNTER — Encounter: Payer: Self-pay | Admitting: Family

## 2017-05-13 DIAGNOSIS — R262 Difficulty in walking, not elsewhere classified: Secondary | ICD-10-CM | POA: Diagnosis not present

## 2017-05-13 DIAGNOSIS — S81811D Laceration without foreign body, right lower leg, subsequent encounter: Secondary | ICD-10-CM

## 2017-05-13 DIAGNOSIS — F411 Generalized anxiety disorder: Secondary | ICD-10-CM

## 2017-05-13 DIAGNOSIS — I10 Essential (primary) hypertension: Secondary | ICD-10-CM

## 2017-05-13 DIAGNOSIS — R2681 Unsteadiness on feet: Secondary | ICD-10-CM | POA: Diagnosis not present

## 2017-05-13 DIAGNOSIS — R29898 Other symptoms and signs involving the musculoskeletal system: Secondary | ICD-10-CM | POA: Diagnosis not present

## 2017-05-13 DIAGNOSIS — K219 Gastro-esophageal reflux disease without esophagitis: Secondary | ICD-10-CM

## 2017-05-13 DIAGNOSIS — Z7409 Other reduced mobility: Secondary | ICD-10-CM | POA: Diagnosis not present

## 2017-05-13 DIAGNOSIS — F0281 Dementia in other diseases classified elsewhere with behavioral disturbance: Secondary | ICD-10-CM

## 2017-05-13 DIAGNOSIS — G309 Alzheimer's disease, unspecified: Secondary | ICD-10-CM | POA: Diagnosis not present

## 2017-05-13 DIAGNOSIS — Z9181 History of falling: Secondary | ICD-10-CM | POA: Diagnosis not present

## 2017-05-13 DIAGNOSIS — M6281 Muscle weakness (generalized): Secondary | ICD-10-CM | POA: Diagnosis not present

## 2017-05-13 NOTE — Progress Notes (Signed)
Location:  [facility] Room Number: 32 Place of Service:  SNF (31) Provider: Dinah Ngetich FNP-C   Blanchie Serve, MD  Patient Care Team: Blanchie Serve, MD as PCP - General (Internal Medicine) Jettie Booze, MD as Consulting Physician (Cardiology) Marchia Bond, MD as Consulting Physician (Orthopedic Surgery) Kathrynn Ducking, MD as Consulting Physician (Neurology) Brand Males, MD as Consulting Physician (Pulmonary Disease) Ngetich, Nelda Bucks, NP as Nurse Practitioner (Family Medicine)  Extended Emergency Contact Information Primary Emergency Contact: [place] Phone: [phone] Relation: Son Secondary Emergency Contact: Ricardo [place] of [place] Phone: [phone] Mobile Phone: [phone] Relation: Daughter  Code Status:  DNR Goals of care: Advanced Directive information Advanced Directives 05/13/2017  Does Patient Have a Medical Advance Directive? Yes  Type of Advance Directive Living will;Healthcare Power of [place];Out of facility DNR (pink MOST or yellow form)  Does patient want to make changes to medical advance directive? -  Copy of [place] in Chart? Yes  Pre-existing out of facility DNR order (yellow form or pink MOST form) Yellow form placed in chart (order not valid for inpatient use)     Chief Complaint  Patient presents with  . Medical Management of Chronic Issues    monthly routine visit    HPI:  Pt is a 84 y.o. female seen today [facility] for medical management of chronic diseases.She has a medical history of HTN,Alzheimer's dementia,GERD,GAD,PVD,depression among other conditions.she is seen in her room today.she is unable to provide HPI and ROS due to her cognitive impairment.she has had no recent fall episode.she continues to progressively loss weight. Wt 121.8 lbs (02/2017);  Wt 115.1 lbs (03/2017 ); Wt 117.7 lbs (04/2017).Facility  nurse reports skin tear to right leg. No fever,chills or cough reported.    Past Medical History:  Diagnosis Date  . Alzheimer's disease 06/22/2015  . Anemia    iron deficient  . Anemia   . Anxiety   . Chronic airway obstruction, not elsewhere classified   . Chronic back pain   . Chronic cystitis   . Cystitis, chronic    Dr Gaynelle Arabian  . Degenerative arthritis   . Depression   . Disturbance of skin sensation   . Disturbance of skin sensation   . Dyslipidemia   . Dyspnea    Dr Chase Caller  . Esophageal reflux   . Essential and other specified forms of tremor 12/11/2012  . Foot drop, bilateral 12/21/2013  . Gait disorder   . GERD (gastroesophageal reflux disease)   . Hemorrhoids   . Hereditary and idiopathic peripheral neuropathy 10/11/2015  . Hernia   . History of cerebrovascular disease    Moderate level small vessel disease  . Hyperlipidemia    mild  . Hypertension   . Osteoarthritis    right knee injection per Dr Trudie Reed  . Osteoporosis   . Other abnormal clinical finding   . Pain in joint, upper arm   . Peripheral vascular disease ([place])   . Rheumatoid arthritis ([place])    Dr Trudie Reed  . Tobacco use disorder   . Tremor    and gait disorder--Dr Jannifer Franklin  . Unspecified vitamin D deficiency    Past Surgical History:  Procedure Laterality Date  . APPENDECTOMY    . BACK SURGERY Bilateral    X2  . Bladder resuspension procedure    . CATARACT EXTRACTION Bilateral   . FEMUR IM NAIL Right 02/22/2014   Procedure: INTRAMEDULLARY (IM)  NAIL FEMORAL;  Surgeon: Johnny Bridge, MD;  Location: [facility];  Service: Orthopedics;  Laterality: Right;  . INGUINAL HERNIA REPAIR Bilateral   . ORIF ANKLE FRACTURE Left 09/19/2016   Procedure: OPEN REDUCTION INTERNAL FIXATION (ORIF) ANKLE FRACTURE;  Surgeon: Vickey Huger, MD;  Location: [facility];  Service: Orthopedics;  Laterality: Left;  . TONSILLECTOMY      Allergies  Allergen Reactions  . Novocain [Procaine]     Passed  out  . Limbrel  [Flavocoxid]     Dizziness   . Sertraline Anxiety    Allergies as of 05/13/2017      Reactions   Novocain [procaine]    Passed  out   Limbrel [flavocoxid]    Dizziness   Sertraline Anxiety      Medication List        Accurate as of 05/13/17  4:55 PM. Always use your most recent med list.          acetaminophen 650 MG CR tablet Commonly known as:  TYLENOL Take 650 mg by mouth 2 (two) times daily.   alum & mag hydroxide-simeth 200-200-20 MG/5ML suspension Commonly known as:  MAALOX/MYLANTA Take 30 mLs by mouth every 4 (four) hours as needed for indigestion or heartburn.   BENECALORIE PO Take 1 oz by mouth daily.   RESOURCE 2.0 Liqd Take 120 mLs by mouth 3 (three) times daily.   CALTRATE 600+D 600-400 MG-UNIT tablet Generic drug:  Calcium Carbonate-Vitamin D Take 1 tablet by mouth daily.   donepezil 5 MG tablet Commonly known as:  ARICEPT Take 5 mg by mouth at bedtime.   feeding supplement (PRO-STAT SUGAR FREE 64) Liqd Take 30 mLs by mouth 2 (two) times daily.   furosemide 20 MG tablet Commonly known as:  LASIX Take 20 mg by mouth daily.   LEVSIN/SL 0.125 MG Subl Generic drug:  Hyoscyamine Sulfate SL Place under the tongue every 12 (twelve) hours as needed (for cramps).   LORazepam 0.5 MG tablet Commonly known as:  ATIVAN Take 0.5 mg by mouth 2 (two) times daily as needed for anxiety.   magnesium hydroxide 400 MG/5ML suspension Commonly known as:  MILK OF MAGNESIA Take 30 mLs by mouth daily as needed for mild constipation.   memantine 10 MG tablet Commonly known as:  NAMENDA Take 10 mg by mouth 2 (two) times daily.   metoprolol tartrate 25 MG tablet Commonly known as:  LOPRESSOR Take 12.5 mg by mouth 2 (two) times daily. Hold for SBP <110 or HR <60/min   mirtazapine 15 MG tablet Commonly known as:  REMERON Take 15 mg by mouth at bedtime.   multivitamin tablet Take 1 tablet by mouth daily.   omeprazole 20 MG capsule Commonly known as:   PRILOSEC Take 20 mg by mouth daily.   ondansetron 4 MG tablet Commonly known as:  ZOFRAN Take 4 mg by mouth every 6 (six) hours as needed for nausea or vomiting.   OSTEO BI-FLEX TRIPLE STRENGTH Tabs Take 1 tablet by mouth daily.   QUEtiapine 25 MG tablet Commonly known as:  SEROQUEL Take 25 mg by mouth 2 (two) times daily. At 9AM and 1PM   ranitidine 150 MG tablet Commonly known as:  ZANTAC Take 150 mg by mouth at bedtime.   sennosides-docusate sodium 8.6-50 MG tablet Commonly known as:  SENOKOT-S Take 2 tablets by mouth daily.       Review of Systems  Unable to perform ROS: Dementia (additional information provided by facility Nurse )  Immunization History  Administered Date(s) Administered  . Influenza Whole 01/16/2010  . Influenza-Unspecified 02/03/2006, 01/09/2007, 01/05/2013, 12/22/2014, 12/28/2015, 12/26/2016  . PPD Test 05/01/2004, 02/24/2014  . Pneumococcal Polysaccharide-23 01/21/2006  . Pneumococcal-Unspecified 12/19/1998  . Td 03/18/1998  . Tdap 09/18/2016   Pertinent  Health Maintenance Due  Topic Date Due  . PNA vac Low Risk Adult (2 of 2 - PCV13) 01/22/2007  . INFLUENZA VACCINE  Completed  . DEXA SCAN  Completed   Fall Risk  10/28/2016 06/20/2015  Falls in the past year? No No  Risk for fall due to : - Impaired balance/gait   Functional Status Survey:    Vitals:   05/13/17 1124  BP: 127/84  Pulse: 96  Resp: 20  Temp: 99.9 F (37.7 C)  SpO2: 90%  Weight: 118 lb 1.6 oz (53.6 kg)  Height: 5\' 1"  (1.549 m)   Body mass index is 22.31 kg/m. Physical Exam  Constitutional:  Frail elderly in no acute distress.  HENT:  Head: Normocephalic.  Right Ear: External ear normal.  Left Ear: External ear normal.  Mouth/Throat: Oropharynx is clear and moist. No oropharyngeal exudate.  Eyes: Conjunctivae and EOM are normal. Pupils are equal, round, and reactive to light. Right eye exhibits no discharge. Left eye exhibits no discharge. No scleral  icterus.  Neck: Normal range of motion. No JVD present. No thyromegaly present.  Cardiovascular: Normal rate, regular rhythm, normal heart sounds and intact distal pulses. Exam reveals no gallop and no friction rub.  No murmur heard. Pulmonary/Chest: Effort normal and breath sounds normal. No respiratory distress. She has no wheezes. She has no rales.  Abdominal: Soft. Bowel sounds are normal. She exhibits no distension. There is no tenderness. There is no rebound and no guarding.  Musculoskeletal: She exhibits no tenderness.  Moves x 4 extremities.unsteady gait transfers with assistance to wheelchair.Bilateral lower extremities edema.Knee high ted hose in place.   Lymphadenopathy:    She has no cervical adenopathy.  Neurological: She is alert. Coordination normal.  Pleasantly confused at her baseline.   Skin: Skin is warm and dry. No rash noted. No erythema.  Right upper shin area skin tear with steri-strips.Surrounding skin tissue without any signs of infections.   Psychiatric: She has a normal mood and affect. Her behavior is normal.   Labs reviewed: Recent Labs    09/18/16 2232 09/18/16 2239 09/26/16 11/04/16 12/12/16  NA 137 136 139 142 139  139  K 4.6 4.5 4.5 3.6 4.0  4.0  CL 99* 96*  --   --  103  CO2 33*  --   --   --  28  GLUCOSE 115* 110*  --   --   --   BUN 24* 30* 16 14 24*  24*  CREATININE 1.06* 1.00 0.9 0.9 0.9  0.93  CALCIUM 9.4  --   --   --  9.4   Recent Labs    05/16/16 12/12/16  AST 16 17  17   ALT 7 8  8   ALKPHOS 70 79  79  BILITOT  --  0.5  PROT  --  6.6  ALBUMIN  --  3.2   Recent Labs    09/18/16 2232  09/26/16 12/12/16  WBC 11.9*  --  10.5 9.8  9.8  NEUTROABS 8.4*  --   --   --   HGB 13.0   < > 13.2 13.6  13.6  HCT 39.5   < > 40 42  42  MCV 99.7  --   --   --  PLT 358  --  447* 350   < > = values in this interval not displayed.   Lab Results  Component Value Date   TSH 1.67 11/07/2016   Lab Results  Component Value Date   HGBA1C  5.8 12/16/2016   Lab Results  Component Value Date   CHOL 174 12/16/2016   CHOL 174 12/16/2016   HDL 68 12/16/2016   HDL 68 12/16/2016   LDLCALC 80 12/16/2016   LDLCALC 80 12/16/2016   TRIG 159 12/16/2016    Significant Diagnostic Results in last 30 days:  No results found.  Assessment/Plan 1. Essential hypertension B/p stable.continue on metoprolol 12.5 mg tablet twice daily and furosemide 20 mg tablet daily.check CBC/diff,TSH level and CMP 05/15/2017.   2. Gastroesophageal reflux disease without esophagitis Complains of chronic nausea on Zofran 4 mg tablet as needed and Zantac 150 mg daily.continue on omeprazole 20 mg capsule daily.obtain mg level 05/15/2017.     3. GAD (generalized anxiety disorder) Continue on lorazepam 0.5 mg tablet twice daily.continue to monitor.   4. Alzheimer's dementia with behavioral disturbance, unspecified timing of dementia onset Continues to say "Help" despite not needing any assistance though this has improved. Continue to assist with her ADl's.continue fall and safety precautions.continue on Namenda 10 mg tablet twice daily,Aricept 5 mg tablet at bedtime and Seroquel 25 mg tablet twice daily.  5. Skin tear of right lower leg without complication, subsequent encounter Right upper shin area skin tear with steri-strips.Surrounding skin tissue without any signs of infections.continue with current dressing change and monitor for signs of infections.   Family/ staff Communication: Reviewed plan of care with patient and facility Nurse.  Labs/tests ordered: CBC/diff,CMP,TSH and Mg level 05/15/2017.   Sandrea Hughs, NP

## 2017-05-14 DIAGNOSIS — R262 Difficulty in walking, not elsewhere classified: Secondary | ICD-10-CM | POA: Diagnosis not present

## 2017-05-14 DIAGNOSIS — R2681 Unsteadiness on feet: Secondary | ICD-10-CM | POA: Diagnosis not present

## 2017-05-14 DIAGNOSIS — Z7409 Other reduced mobility: Secondary | ICD-10-CM | POA: Diagnosis not present

## 2017-05-14 DIAGNOSIS — R29898 Other symptoms and signs involving the musculoskeletal system: Secondary | ICD-10-CM | POA: Diagnosis not present

## 2017-05-14 DIAGNOSIS — M6281 Muscle weakness (generalized): Secondary | ICD-10-CM | POA: Diagnosis not present

## 2017-05-14 DIAGNOSIS — Z9181 History of falling: Secondary | ICD-10-CM | POA: Diagnosis not present

## 2017-05-15 DIAGNOSIS — M6281 Muscle weakness (generalized): Secondary | ICD-10-CM | POA: Diagnosis not present

## 2017-05-15 DIAGNOSIS — Z9181 History of falling: Secondary | ICD-10-CM | POA: Diagnosis not present

## 2017-05-15 DIAGNOSIS — Z7409 Other reduced mobility: Secondary | ICD-10-CM | POA: Diagnosis not present

## 2017-05-15 DIAGNOSIS — Z4789 Encounter for other orthopedic aftercare: Secondary | ICD-10-CM | POA: Diagnosis not present

## 2017-05-15 DIAGNOSIS — I10 Essential (primary) hypertension: Secondary | ICD-10-CM | POA: Diagnosis not present

## 2017-05-15 DIAGNOSIS — R29898 Other symptoms and signs involving the musculoskeletal system: Secondary | ICD-10-CM | POA: Diagnosis not present

## 2017-05-15 DIAGNOSIS — R2681 Unsteadiness on feet: Secondary | ICD-10-CM | POA: Diagnosis not present

## 2017-05-15 DIAGNOSIS — R262 Difficulty in walking, not elsewhere classified: Secondary | ICD-10-CM | POA: Diagnosis not present

## 2017-05-15 LAB — CBC
HCT: 38.8
HGB: 13
PLATELET COUNT: 332
WBC: 9.6

## 2017-05-15 LAB — COMPLETE METABOLIC PANEL WITH GFR
ALT: 8
AST: 16
Albumin: 3.2
Alkaline Phosphatase: 70
BUN: 25 — AB (ref 4–21)
CREATININE: 0.82
Calcium: 9.3
Glucose: 105
POTASSIUM: 4.2
SODIUM: 140
TOTAL PROTEIN: 6.5 g/dL
Total Bilirubin: 0.3

## 2017-05-15 LAB — HEPATIC FUNCTION PANEL
ALT: 8 (ref 7–35)
AST: 16 (ref 13–35)
Alkaline Phosphatase: 70 (ref 25–125)
BILIRUBIN, TOTAL: 0.3

## 2017-05-15 LAB — CBC AND DIFFERENTIAL
HCT: 39 (ref 36–46)
HEMOGLOBIN: 13 (ref 12.0–16.0)
Platelets: 332 (ref 150–399)
WBC: 9.6

## 2017-05-15 LAB — BASIC METABOLIC PANEL
Creatinine: 0.8 (ref 0.5–1.1)
Glucose: 105
Potassium: 4.2 (ref 3.4–5.3)
Sodium: 140 (ref 137–147)

## 2017-05-15 LAB — MAGNESIUM: Magnesium: 2.1

## 2017-05-15 LAB — TSH
TSH: 1.09
TSH: 1.09 (ref 0.41–5.90)

## 2017-05-16 ENCOUNTER — Encounter: Payer: Self-pay | Admitting: *Deleted

## 2017-05-16 DIAGNOSIS — M21372 Foot drop, left foot: Secondary | ICD-10-CM | POA: Diagnosis not present

## 2017-05-16 DIAGNOSIS — R29898 Other symptoms and signs involving the musculoskeletal system: Secondary | ICD-10-CM | POA: Diagnosis not present

## 2017-05-16 DIAGNOSIS — R4789 Other speech disturbances: Secondary | ICD-10-CM | POA: Diagnosis not present

## 2017-05-16 DIAGNOSIS — Z9181 History of falling: Secondary | ICD-10-CM | POA: Diagnosis not present

## 2017-05-16 DIAGNOSIS — H10023 Other mucopurulent conjunctivitis, bilateral: Secondary | ICD-10-CM | POA: Diagnosis not present

## 2017-05-16 DIAGNOSIS — K648 Other hemorrhoids: Secondary | ICD-10-CM | POA: Diagnosis not present

## 2017-05-16 DIAGNOSIS — K5900 Constipation, unspecified: Secondary | ICD-10-CM | POA: Diagnosis not present

## 2017-05-16 DIAGNOSIS — J984 Other disorders of lung: Secondary | ICD-10-CM | POA: Diagnosis not present

## 2017-05-16 DIAGNOSIS — M6281 Muscle weakness (generalized): Secondary | ICD-10-CM | POA: Diagnosis not present

## 2017-05-16 DIAGNOSIS — M21371 Foot drop, right foot: Secondary | ICD-10-CM | POA: Diagnosis not present

## 2017-05-16 DIAGNOSIS — I679 Cerebrovascular disease, unspecified: Secondary | ICD-10-CM | POA: Diagnosis not present

## 2017-05-16 DIAGNOSIS — R2681 Unsteadiness on feet: Secondary | ICD-10-CM | POA: Diagnosis not present

## 2017-05-16 DIAGNOSIS — M4800 Spinal stenosis, site unspecified: Secondary | ICD-10-CM | POA: Diagnosis not present

## 2017-05-16 DIAGNOSIS — F329 Major depressive disorder, single episode, unspecified: Secondary | ICD-10-CM | POA: Diagnosis not present

## 2017-05-16 DIAGNOSIS — R609 Edema, unspecified: Secondary | ICD-10-CM | POA: Diagnosis not present

## 2017-05-16 DIAGNOSIS — G309 Alzheimer's disease, unspecified: Secondary | ICD-10-CM | POA: Diagnosis not present

## 2017-05-16 DIAGNOSIS — G609 Hereditary and idiopathic neuropathy, unspecified: Secondary | ICD-10-CM | POA: Diagnosis not present

## 2017-05-16 DIAGNOSIS — Z7409 Other reduced mobility: Secondary | ICD-10-CM | POA: Diagnosis not present

## 2017-05-16 DIAGNOSIS — R278 Other lack of coordination: Secondary | ICD-10-CM | POA: Diagnosis not present

## 2017-05-16 DIAGNOSIS — G250 Essential tremor: Secondary | ICD-10-CM | POA: Diagnosis not present

## 2017-05-16 DIAGNOSIS — M25562 Pain in left knee: Secondary | ICD-10-CM | POA: Diagnosis not present

## 2017-05-16 DIAGNOSIS — I10 Essential (primary) hypertension: Secondary | ICD-10-CM | POA: Diagnosis not present

## 2017-05-16 DIAGNOSIS — I2789 Other specified pulmonary heart diseases: Secondary | ICD-10-CM | POA: Diagnosis not present

## 2017-05-16 DIAGNOSIS — K219 Gastro-esophageal reflux disease without esophagitis: Secondary | ICD-10-CM | POA: Diagnosis not present

## 2017-05-19 DIAGNOSIS — R4789 Other speech disturbances: Secondary | ICD-10-CM | POA: Diagnosis not present

## 2017-05-19 DIAGNOSIS — Z7409 Other reduced mobility: Secondary | ICD-10-CM | POA: Diagnosis not present

## 2017-05-19 DIAGNOSIS — R2681 Unsteadiness on feet: Secondary | ICD-10-CM | POA: Diagnosis not present

## 2017-05-19 DIAGNOSIS — M6281 Muscle weakness (generalized): Secondary | ICD-10-CM | POA: Diagnosis not present

## 2017-05-19 DIAGNOSIS — Z9181 History of falling: Secondary | ICD-10-CM | POA: Diagnosis not present

## 2017-05-19 DIAGNOSIS — R29898 Other symptoms and signs involving the musculoskeletal system: Secondary | ICD-10-CM | POA: Diagnosis not present

## 2017-05-20 DIAGNOSIS — M6281 Muscle weakness (generalized): Secondary | ICD-10-CM | POA: Diagnosis not present

## 2017-05-20 DIAGNOSIS — R4789 Other speech disturbances: Secondary | ICD-10-CM | POA: Diagnosis not present

## 2017-05-20 DIAGNOSIS — R2681 Unsteadiness on feet: Secondary | ICD-10-CM | POA: Diagnosis not present

## 2017-05-20 DIAGNOSIS — R29898 Other symptoms and signs involving the musculoskeletal system: Secondary | ICD-10-CM | POA: Diagnosis not present

## 2017-05-20 DIAGNOSIS — Z7409 Other reduced mobility: Secondary | ICD-10-CM | POA: Diagnosis not present

## 2017-05-20 DIAGNOSIS — Z9181 History of falling: Secondary | ICD-10-CM | POA: Diagnosis not present

## 2017-05-21 DIAGNOSIS — R29898 Other symptoms and signs involving the musculoskeletal system: Secondary | ICD-10-CM | POA: Diagnosis not present

## 2017-05-21 DIAGNOSIS — Z7409 Other reduced mobility: Secondary | ICD-10-CM | POA: Diagnosis not present

## 2017-05-21 DIAGNOSIS — R4789 Other speech disturbances: Secondary | ICD-10-CM | POA: Diagnosis not present

## 2017-05-21 DIAGNOSIS — Z9181 History of falling: Secondary | ICD-10-CM | POA: Diagnosis not present

## 2017-05-21 DIAGNOSIS — M6281 Muscle weakness (generalized): Secondary | ICD-10-CM | POA: Diagnosis not present

## 2017-05-21 DIAGNOSIS — R2681 Unsteadiness on feet: Secondary | ICD-10-CM | POA: Diagnosis not present

## 2017-05-22 DIAGNOSIS — R2681 Unsteadiness on feet: Secondary | ICD-10-CM | POA: Diagnosis not present

## 2017-05-22 DIAGNOSIS — Z7409 Other reduced mobility: Secondary | ICD-10-CM | POA: Diagnosis not present

## 2017-05-22 DIAGNOSIS — Z9181 History of falling: Secondary | ICD-10-CM | POA: Diagnosis not present

## 2017-05-22 DIAGNOSIS — R4789 Other speech disturbances: Secondary | ICD-10-CM | POA: Diagnosis not present

## 2017-05-22 DIAGNOSIS — M6281 Muscle weakness (generalized): Secondary | ICD-10-CM | POA: Diagnosis not present

## 2017-05-22 DIAGNOSIS — R29898 Other symptoms and signs involving the musculoskeletal system: Secondary | ICD-10-CM | POA: Diagnosis not present

## 2017-05-26 DIAGNOSIS — Z9181 History of falling: Secondary | ICD-10-CM | POA: Diagnosis not present

## 2017-05-26 DIAGNOSIS — R2681 Unsteadiness on feet: Secondary | ICD-10-CM | POA: Diagnosis not present

## 2017-05-26 DIAGNOSIS — Z7409 Other reduced mobility: Secondary | ICD-10-CM | POA: Diagnosis not present

## 2017-05-26 DIAGNOSIS — R29898 Other symptoms and signs involving the musculoskeletal system: Secondary | ICD-10-CM | POA: Diagnosis not present

## 2017-05-26 DIAGNOSIS — M6281 Muscle weakness (generalized): Secondary | ICD-10-CM | POA: Diagnosis not present

## 2017-05-26 DIAGNOSIS — R4789 Other speech disturbances: Secondary | ICD-10-CM | POA: Diagnosis not present

## 2017-05-27 DIAGNOSIS — R29898 Other symptoms and signs involving the musculoskeletal system: Secondary | ICD-10-CM | POA: Diagnosis not present

## 2017-05-27 DIAGNOSIS — R2681 Unsteadiness on feet: Secondary | ICD-10-CM | POA: Diagnosis not present

## 2017-05-27 DIAGNOSIS — R4789 Other speech disturbances: Secondary | ICD-10-CM | POA: Diagnosis not present

## 2017-05-27 DIAGNOSIS — Z7409 Other reduced mobility: Secondary | ICD-10-CM | POA: Diagnosis not present

## 2017-05-27 DIAGNOSIS — M6281 Muscle weakness (generalized): Secondary | ICD-10-CM | POA: Diagnosis not present

## 2017-05-27 DIAGNOSIS — Z9181 History of falling: Secondary | ICD-10-CM | POA: Diagnosis not present

## 2017-05-28 DIAGNOSIS — R29898 Other symptoms and signs involving the musculoskeletal system: Secondary | ICD-10-CM | POA: Diagnosis not present

## 2017-05-28 DIAGNOSIS — C44622 Squamous cell carcinoma of skin of right upper limb, including shoulder: Secondary | ICD-10-CM | POA: Diagnosis not present

## 2017-05-28 DIAGNOSIS — R2681 Unsteadiness on feet: Secondary | ICD-10-CM | POA: Diagnosis not present

## 2017-05-28 DIAGNOSIS — M6281 Muscle weakness (generalized): Secondary | ICD-10-CM | POA: Diagnosis not present

## 2017-05-28 DIAGNOSIS — Z7409 Other reduced mobility: Secondary | ICD-10-CM | POA: Diagnosis not present

## 2017-05-28 DIAGNOSIS — Z9181 History of falling: Secondary | ICD-10-CM | POA: Diagnosis not present

## 2017-05-28 DIAGNOSIS — L821 Other seborrheic keratosis: Secondary | ICD-10-CM | POA: Diagnosis not present

## 2017-05-28 DIAGNOSIS — L570 Actinic keratosis: Secondary | ICD-10-CM | POA: Diagnosis not present

## 2017-05-28 DIAGNOSIS — R4789 Other speech disturbances: Secondary | ICD-10-CM | POA: Diagnosis not present

## 2017-05-29 DIAGNOSIS — M6281 Muscle weakness (generalized): Secondary | ICD-10-CM | POA: Diagnosis not present

## 2017-05-29 DIAGNOSIS — R4789 Other speech disturbances: Secondary | ICD-10-CM | POA: Diagnosis not present

## 2017-05-29 DIAGNOSIS — R29898 Other symptoms and signs involving the musculoskeletal system: Secondary | ICD-10-CM | POA: Diagnosis not present

## 2017-05-29 DIAGNOSIS — Z9181 History of falling: Secondary | ICD-10-CM | POA: Diagnosis not present

## 2017-05-29 DIAGNOSIS — Z7409 Other reduced mobility: Secondary | ICD-10-CM | POA: Diagnosis not present

## 2017-05-29 DIAGNOSIS — R2681 Unsteadiness on feet: Secondary | ICD-10-CM | POA: Diagnosis not present

## 2017-06-02 DIAGNOSIS — R4789 Other speech disturbances: Secondary | ICD-10-CM | POA: Diagnosis not present

## 2017-06-02 DIAGNOSIS — R2681 Unsteadiness on feet: Secondary | ICD-10-CM | POA: Diagnosis not present

## 2017-06-02 DIAGNOSIS — Z7409 Other reduced mobility: Secondary | ICD-10-CM | POA: Diagnosis not present

## 2017-06-02 DIAGNOSIS — M6281 Muscle weakness (generalized): Secondary | ICD-10-CM | POA: Diagnosis not present

## 2017-06-02 DIAGNOSIS — Z9181 History of falling: Secondary | ICD-10-CM | POA: Diagnosis not present

## 2017-06-02 DIAGNOSIS — R29898 Other symptoms and signs involving the musculoskeletal system: Secondary | ICD-10-CM | POA: Diagnosis not present

## 2017-06-03 DIAGNOSIS — R4789 Other speech disturbances: Secondary | ICD-10-CM | POA: Diagnosis not present

## 2017-06-03 DIAGNOSIS — R29898 Other symptoms and signs involving the musculoskeletal system: Secondary | ICD-10-CM | POA: Diagnosis not present

## 2017-06-03 DIAGNOSIS — Z9181 History of falling: Secondary | ICD-10-CM | POA: Diagnosis not present

## 2017-06-03 DIAGNOSIS — R2681 Unsteadiness on feet: Secondary | ICD-10-CM | POA: Diagnosis not present

## 2017-06-03 DIAGNOSIS — M6281 Muscle weakness (generalized): Secondary | ICD-10-CM | POA: Diagnosis not present

## 2017-06-03 DIAGNOSIS — Z7409 Other reduced mobility: Secondary | ICD-10-CM | POA: Diagnosis not present

## 2017-06-04 DIAGNOSIS — Z9181 History of falling: Secondary | ICD-10-CM | POA: Diagnosis not present

## 2017-06-04 DIAGNOSIS — Z7409 Other reduced mobility: Secondary | ICD-10-CM | POA: Diagnosis not present

## 2017-06-04 DIAGNOSIS — R4789 Other speech disturbances: Secondary | ICD-10-CM | POA: Diagnosis not present

## 2017-06-04 DIAGNOSIS — R2681 Unsteadiness on feet: Secondary | ICD-10-CM | POA: Diagnosis not present

## 2017-06-04 DIAGNOSIS — R29898 Other symptoms and signs involving the musculoskeletal system: Secondary | ICD-10-CM | POA: Diagnosis not present

## 2017-06-04 DIAGNOSIS — M6281 Muscle weakness (generalized): Secondary | ICD-10-CM | POA: Diagnosis not present

## 2017-06-05 DIAGNOSIS — R4789 Other speech disturbances: Secondary | ICD-10-CM | POA: Diagnosis not present

## 2017-06-05 DIAGNOSIS — R2681 Unsteadiness on feet: Secondary | ICD-10-CM | POA: Diagnosis not present

## 2017-06-05 DIAGNOSIS — Z7409 Other reduced mobility: Secondary | ICD-10-CM | POA: Diagnosis not present

## 2017-06-05 DIAGNOSIS — Z9181 History of falling: Secondary | ICD-10-CM | POA: Diagnosis not present

## 2017-06-05 DIAGNOSIS — M6281 Muscle weakness (generalized): Secondary | ICD-10-CM | POA: Diagnosis not present

## 2017-06-05 DIAGNOSIS — R29898 Other symptoms and signs involving the musculoskeletal system: Secondary | ICD-10-CM | POA: Diagnosis not present

## 2017-06-06 DIAGNOSIS — M6281 Muscle weakness (generalized): Secondary | ICD-10-CM | POA: Diagnosis not present

## 2017-06-06 DIAGNOSIS — Z9181 History of falling: Secondary | ICD-10-CM | POA: Diagnosis not present

## 2017-06-06 DIAGNOSIS — R29898 Other symptoms and signs involving the musculoskeletal system: Secondary | ICD-10-CM | POA: Diagnosis not present

## 2017-06-06 DIAGNOSIS — R2681 Unsteadiness on feet: Secondary | ICD-10-CM | POA: Diagnosis not present

## 2017-06-06 DIAGNOSIS — R4789 Other speech disturbances: Secondary | ICD-10-CM | POA: Diagnosis not present

## 2017-06-06 DIAGNOSIS — Z7409 Other reduced mobility: Secondary | ICD-10-CM | POA: Diagnosis not present

## 2017-06-09 DIAGNOSIS — R2681 Unsteadiness on feet: Secondary | ICD-10-CM | POA: Diagnosis not present

## 2017-06-09 DIAGNOSIS — M6281 Muscle weakness (generalized): Secondary | ICD-10-CM | POA: Diagnosis not present

## 2017-06-09 DIAGNOSIS — R4789 Other speech disturbances: Secondary | ICD-10-CM | POA: Diagnosis not present

## 2017-06-09 DIAGNOSIS — Z7409 Other reduced mobility: Secondary | ICD-10-CM | POA: Diagnosis not present

## 2017-06-09 DIAGNOSIS — Z9181 History of falling: Secondary | ICD-10-CM | POA: Diagnosis not present

## 2017-06-09 DIAGNOSIS — R29898 Other symptoms and signs involving the musculoskeletal system: Secondary | ICD-10-CM | POA: Diagnosis not present

## 2017-06-10 ENCOUNTER — Non-Acute Institutional Stay (SKILLED_NURSING_FACILITY): Payer: Medicare Other | Admitting: Family

## 2017-06-10 ENCOUNTER — Encounter: Payer: Self-pay | Admitting: Family

## 2017-06-10 DIAGNOSIS — K219 Gastro-esophageal reflux disease without esophagitis: Secondary | ICD-10-CM

## 2017-06-10 DIAGNOSIS — R2681 Unsteadiness on feet: Secondary | ICD-10-CM | POA: Diagnosis not present

## 2017-06-10 DIAGNOSIS — Z7409 Other reduced mobility: Secondary | ICD-10-CM | POA: Diagnosis not present

## 2017-06-10 DIAGNOSIS — R29898 Other symptoms and signs involving the musculoskeletal system: Secondary | ICD-10-CM | POA: Diagnosis not present

## 2017-06-10 DIAGNOSIS — F0281 Dementia in other diseases classified elsewhere with behavioral disturbance: Secondary | ICD-10-CM

## 2017-06-10 DIAGNOSIS — M6281 Muscle weakness (generalized): Secondary | ICD-10-CM | POA: Diagnosis not present

## 2017-06-10 DIAGNOSIS — G309 Alzheimer's disease, unspecified: Secondary | ICD-10-CM | POA: Diagnosis not present

## 2017-06-10 DIAGNOSIS — I10 Essential (primary) hypertension: Secondary | ICD-10-CM

## 2017-06-10 DIAGNOSIS — Z9181 History of falling: Secondary | ICD-10-CM | POA: Diagnosis not present

## 2017-06-10 DIAGNOSIS — R4789 Other speech disturbances: Secondary | ICD-10-CM | POA: Diagnosis not present

## 2017-06-10 NOTE — Progress Notes (Signed)
Location:  [facility] Room Number: 32 Place of Service:  SNF (31) Provider: Dinah Ngetich FNP-C   Blanchie Serve, MD  Patient Care Team: Blanchie Serve, MD as PCP - General (Internal Medicine) Jettie Booze, MD as Consulting Physician (Cardiology) Marchia Bond, MD as Consulting Physician (Orthopedic Surgery) Kathrynn Ducking, MD as Consulting Physician (Neurology) Brand Males, MD as Consulting Physician (Pulmonary Disease) Ngetich, Nelda Bucks, NP as Nurse Practitioner (Family Medicine)  Extended Emergency Contact Information Primary Emergency Contact: [place] of [employer] Phone: [phone] Relation: Son Secondary Emergency Contact: Ricardo Jericho [place] of [place] Phone: [phone] Mobile Phone: [phone] Relation: Daughter  Code Status:  DNR  Goals of care: Advanced Directive information Advanced Directives 05/13/2017  Does Patient Have a Medical Advance Directive? Yes  Type of Advance Directive Living will;Healthcare Power of [place];Out of facility DNR (pink MOST or yellow form)  Does patient want to make changes to medical advance directive? -  Copy of [place] in Chart? Yes  Pre-existing out of facility DNR order (yellow form or pink MOST form) Yellow form placed in chart (order not valid for inpatient use)     Chief Complaint  Patient presents with  . Medical Management of Chronic Issues    HPI:  Pt is a 84 y.o. 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 She denies any pain or itching on AK affected areas.Per Dermatology patient and patient's POA decline referral to surgery.      Past Medical History:  Diagnosis Date  . Alzheimer's disease 06/22/2015  . Anemia    iron deficient  . Anemia   . Anxiety   . Chronic airway obstruction, not elsewhere classified   . Chronic back pain   . Chronic cystitis   . Cystitis, chronic    Dr Gaynelle Arabian  . Degenerative arthritis   . Depression   . Disturbance of skin sensation   . Disturbance of skin sensation   . Dyslipidemia   . Dyspnea    Dr Chase Caller  . Esophageal reflux   . Essential and other specified forms of tremor 12/11/2012  . Foot drop, bilateral 12/21/2013  . Gait disorder   . GERD (gastroesophageal reflux disease)   . Hemorrhoids   . Hereditary and idiopathic peripheral neuropathy 10/11/2015  . Hernia   . History of cerebrovascular disease    Moderate level small vessel disease  . Hyperlipidemia    mild  . Hypertension   . Osteoarthritis    right knee injection per Dr Trudie Reed  . Osteoporosis   . Other abnormal clinical finding   . Pain in joint, upper arm   . Peripheral vascular disease ([place])   . Rheumatoid arthritis ([place])    Dr Trudie Reed  . Tobacco use disorder   . Tremor    and gait disorder--Dr Jannifer Franklin  . Unspecified vitamin D deficiency    Past Surgical History:  Procedure Laterality Date  . APPENDECTOMY    .  BACK SURGERY Bilateral    X2  . Bladder resuspension procedure    . CATARACT EXTRACTION Bilateral   . FEMUR IM NAIL Right 02/22/2014   Procedure: INTRAMEDULLARY (IM) NAIL FEMORAL;  Surgeon: Johnny Bridge, MD;  Location: [facility];  Service: Orthopedics;  Laterality: Right;  . INGUINAL HERNIA REPAIR Bilateral   . ORIF ANKLE FRACTURE Left 09/19/2016   Procedure: OPEN REDUCTION INTERNAL FIXATION (ORIF) ANKLE FRACTURE;   Surgeon: Vickey Huger, MD;  Location: [facility];  Service: Orthopedics;  Laterality: Left;  . TONSILLECTOMY      Allergies  Allergen Reactions  . Novocain [Procaine]     Passed  out  . Limbrel [Flavocoxid]     Dizziness   . Sertraline Anxiety    Allergies as of 06/10/2017      Reactions   Novocain [procaine]    Passed  out   Limbrel [flavocoxid]    Dizziness   Sertraline Anxiety      Medication List        Accurate as of 06/10/17  3:11 PM. Always use your most recent med list.          acetaminophen 650 MG CR tablet Commonly known as:  TYLENOL Take 650 mg by mouth 2 (two) times daily.   alum & mag hydroxide-simeth 200-200-20 MG/5ML suspension Commonly known as:  MAALOX/MYLANTA Take 30 mLs by mouth every 4 (four) hours as needed for indigestion or heartburn.   BENECALORIE PO Take 1 oz by mouth daily.   RESOURCE 2.0 Liqd Take 120 mLs by mouth 3 (three) times daily.   CALTRATE 600+D 600-400 MG-UNIT tablet Generic drug:  Calcium Carbonate-Vitamin D Take 1 tablet by mouth daily.   feeding supplement (PRO-STAT SUGAR FREE 64) Liqd Take 30 mLs by mouth 2 (two) times daily.   furosemide 20 MG tablet Commonly known as:  LASIX Take 20 mg by mouth daily.   LEVSIN/SL 0.125 MG Subl Generic drug:  Hyoscyamine Sulfate SL Place under the tongue every 12 (twelve) hours as needed (for cramps).   LORazepam 0.5 MG tablet Commonly known as:  ATIVAN Take 0.5 mg by mouth 2 (two) times daily as needed for anxiety.   magnesium hydroxide 400 MG/5ML suspension Commonly known as:  MILK OF MAGNESIA Take 30 mLs by mouth daily as needed for mild constipation.   memantine 10 MG tablet Commonly known as:  NAMENDA Take 10 mg by mouth 2 (two) times daily.   metoprolol tartrate 25 MG tablet Commonly known as:  LOPRESSOR Take 12.5 mg by mouth 2 (two) times daily. Hold for SBP <110 or HR <60/min   mirtazapine 15 MG tablet Commonly known as:  REMERON Take 15 mg by mouth at bedtime.     multivitamin tablet Take 1 tablet by mouth daily.   omeprazole 20 MG capsule Commonly known as:  PRILOSEC Take 20 mg by mouth daily.   ondansetron 4 MG tablet Commonly known as:  ZOFRAN Take 4 mg by mouth every 6 (six) hours as needed for nausea or vomiting.   OSTEO BI-FLEX TRIPLE STRENGTH Tabs Take 1 tablet by mouth daily.   QUEtiapine 25 MG tablet Commonly known as:  SEROQUEL Take 25 mg by mouth 2 (two) times daily. At 9AM and 1PM   ranitidine 150 MG tablet Commonly known as:  ZANTAC Take 150 mg by mouth at bedtime.   sennosides-docusate sodium 8.6-50 MG tablet Commonly known as:  SENOKOT-S Take 2 tablets by mouth daily.       Review of  Systems  Unable to perform ROS: Dementia (additional information provided by facility nurse )  Constitutional: Negative for appetite change, chills, fatigue and fever.  HENT: Positive for hearing loss. Negative for congestion, rhinorrhea, sinus pressure, sinus pain, sneezing and sore throat.   Eyes: Negative for pain, discharge, redness, itching and visual disturbance.  Respiratory: Negative for cough, chest tightness, shortness of breath and wheezing.   Cardiovascular: Positive for leg swelling. Negative for chest pain and palpitations.  Gastrointestinal: Negative for abdominal distention, abdominal pain, constipation, diarrhea, nausea and vomiting.  Endocrine: Negative for cold intolerance, heat intolerance, polydipsia, polyphagia and polyuria.  Genitourinary: Negative for dysuria, flank pain, frequency and urgency.  Musculoskeletal: Positive for gait problem.  Skin: Negative for color change, pallor and rash.  Neurological: Negative for dizziness, light-headedness and headaches.  Hematological: Does not bruise/bleed easily.  Psychiatric/Behavioral: Positive for confusion. Negative for agitation and sleep disturbance. The patient is not nervous/anxious.     Immunization History  Administered Date(s) Administered  . Influenza Whole  01/16/2010  . Influenza-Unspecified 02/03/2006, 01/09/2007, 01/05/2013, 12/22/2014, 12/28/2015, 12/26/2016  . PPD Test 05/01/2004, 02/24/2014  . Pneumococcal Polysaccharide-23 01/21/2006  . Pneumococcal-Unspecified 12/19/1998  . Td 03/18/1998  . Tdap 09/18/2016   Pertinent  Health Maintenance Due  Topic Date Due  . PNA vac Low Risk Adult (2 of 2 - PCV13) 01/22/2007  . INFLUENZA VACCINE  Completed  . DEXA SCAN  Completed   Fall Risk  10/28/2016 06/20/2015  Falls in the past year? No No  Risk for fall due to : - Impaired balance/gait    Vitals:   06/10/17 1130  BP: 118/78  Pulse: 77  Resp: 18  Temp: (!) 97.2 F (36.2 C)  SpO2: 95%  Weight: 122 lb 4.8 oz (55.5 kg)  Height: 5\' 1"  (1.549 m)   Body mass index is 23.11 kg/m. Physical Exam  Constitutional: She appears well-developed.  Elderly in npo acute distress   HENT:  Head: Normocephalic.  Mouth/Throat: Oropharynx is clear and moist. No oropharyngeal exudate.  Eyes: Pupils are equal, round, and reactive to light. Conjunctivae and EOM are normal. Right eye exhibits no discharge. Left eye exhibits no discharge. No scleral icterus.  Neck: No JVD present. No thyromegaly present.  Cardiovascular: Normal rate, regular rhythm, normal heart sounds and intact distal pulses. Exam reveals no gallop and no friction rub.  No murmur heard. Pulmonary/Chest: Effort normal and breath sounds normal. No respiratory distress. She has no wheezes. She has no rales.  Abdominal: Soft. Bowel sounds are normal. She exhibits no distension. There is no tenderness. There is no rebound and no guarding.  Musculoskeletal: She exhibits edema. She exhibits no tenderness.  Unsteady gait self propel on wheelchair.Bilateral lower extremities knee high ted hose in place.   Lymphadenopathy:    She has no cervical adenopathy.  Neurological: Coordination normal.  Pleasantly confused at her baseline   Skin: Skin is warm and dry. No rash noted. No erythema. No  pallor.  Psychiatric: She has a normal mood and affect. Her speech is normal and behavior is normal. She exhibits abnormal recent memory.    Labs reviewed: Recent Labs    09/18/16 2232 09/18/16 2239  11/04/16 12/12/16 05/15/17  NA 137 136   < > 142 139  139 140  K 4.6 4.5   < > 3.6 4.0  4.0 4.2  CL 99* 96*  --   --  103  --   CO2 33*  --   --   --  28  --   GLUCOSE 115* 110*  --   --   --   --   BUN 24* 30*   < > 14 24*  24* 25*  CREATININE 1.06* 1.00   < > 0.9 0.9  0.93 0.82  CALCIUM 9.4  --   --   --  9.4 9.3  MG  --   --   --   --   --  2.1   < > = values in this interval not displayed.   Recent Labs    12/12/16 05/15/17  AST 17  17 16   ALT 8  8 8   ALKPHOS 79  79 70  BILITOT 0.5 0.3  PROT 6.6 6.5  ALBUMIN 3.2 3.2   Recent Labs    09/18/16 2232  09/26/16 12/12/16 05/15/17  WBC 11.9*  --  10.5 9.8  9.8 9.6  NEUTROABS 8.4*  --   --   --   --   HGB 13.0   < > 13.2 13.6  13.6 13.0  HCT 39.5   < > 40 42  42 38.8  MCV 99.7  --   --   --   --   PLT 358  --  447* 350  --    < > = values in this interval not displayed.   Lab Results  Component Value Date   TSH 1.09 05/15/2017   Lab Results  Component Value Date   HGBA1C 5.8 12/16/2016   Lab Results  Component Value Date   CHOL 174 12/16/2016   CHOL 174 12/16/2016   HDL 68 12/16/2016   HDL 68 12/16/2016   LDLCALC 80 12/16/2016   LDLCALC 80 12/16/2016   TRIG 159 12/16/2016    Significant Diagnostic Results in last 30 days:  No results found.  Assessment/Plan 1. Unsteady gait Continue to work with therapy.No recent fall episode.continue to work with PT/OT for exercise,muscle strengthening and gait training.continue on fall and safety precautions.   2. Essential hypertension B/p stable.continue on metoprolol 12.5 mg tablet twice daily and lasix 20 mg tablet daily.will reduce metoprolol to 12.5 mg tablet daily if HR and B/P remains stable.continue to monitor BMP. Off ASA due to high risk for falls.     3. Gastroesophageal reflux disease without esophagitis Stable.continue on omeprazole 20 mg capsule daily and ranitidine 150 mg tablet daily at bedtime.   4. Alzheimer's dementia with behavioral disturbance,  No new behavioral issues reported by Nurse.continue to assist with her ADL's.continue on Ativan 0.5 mg tablet twice daily and Namenda 10 mg Tablet twice daily.Her Aricept 5 mg tablet was discontinued due to weight loss.    Family/ staff Communication: Reviewed plan of care with patient and facility Nurse.   Labs/tests ordered: None   Dinah C Ngetich, NP

## 2017-06-11 DIAGNOSIS — R29898 Other symptoms and signs involving the musculoskeletal system: Secondary | ICD-10-CM | POA: Diagnosis not present

## 2017-06-11 DIAGNOSIS — R4789 Other speech disturbances: Secondary | ICD-10-CM | POA: Diagnosis not present

## 2017-06-11 DIAGNOSIS — Z7409 Other reduced mobility: Secondary | ICD-10-CM | POA: Diagnosis not present

## 2017-06-11 DIAGNOSIS — Z9181 History of falling: Secondary | ICD-10-CM | POA: Diagnosis not present

## 2017-06-11 DIAGNOSIS — R2681 Unsteadiness on feet: Secondary | ICD-10-CM | POA: Diagnosis not present

## 2017-06-11 DIAGNOSIS — M6281 Muscle weakness (generalized): Secondary | ICD-10-CM | POA: Diagnosis not present

## 2017-06-12 DIAGNOSIS — Z9181 History of falling: Secondary | ICD-10-CM | POA: Diagnosis not present

## 2017-06-12 DIAGNOSIS — M6281 Muscle weakness (generalized): Secondary | ICD-10-CM | POA: Diagnosis not present

## 2017-06-12 DIAGNOSIS — R29898 Other symptoms and signs involving the musculoskeletal system: Secondary | ICD-10-CM | POA: Diagnosis not present

## 2017-06-12 DIAGNOSIS — R2681 Unsteadiness on feet: Secondary | ICD-10-CM | POA: Diagnosis not present

## 2017-06-12 DIAGNOSIS — R4789 Other speech disturbances: Secondary | ICD-10-CM | POA: Diagnosis not present

## 2017-06-12 DIAGNOSIS — Z7409 Other reduced mobility: Secondary | ICD-10-CM | POA: Diagnosis not present

## 2017-06-13 DIAGNOSIS — R29898 Other symptoms and signs involving the musculoskeletal system: Secondary | ICD-10-CM | POA: Diagnosis not present

## 2017-06-13 DIAGNOSIS — Z9181 History of falling: Secondary | ICD-10-CM | POA: Diagnosis not present

## 2017-06-13 DIAGNOSIS — M6281 Muscle weakness (generalized): Secondary | ICD-10-CM | POA: Diagnosis not present

## 2017-06-13 DIAGNOSIS — R4789 Other speech disturbances: Secondary | ICD-10-CM | POA: Diagnosis not present

## 2017-06-13 DIAGNOSIS — R2681 Unsteadiness on feet: Secondary | ICD-10-CM | POA: Diagnosis not present

## 2017-06-13 DIAGNOSIS — Z7409 Other reduced mobility: Secondary | ICD-10-CM | POA: Diagnosis not present

## 2017-06-16 DIAGNOSIS — R4789 Other speech disturbances: Secondary | ICD-10-CM | POA: Diagnosis not present

## 2017-06-16 DIAGNOSIS — H10023 Other mucopurulent conjunctivitis, bilateral: Secondary | ICD-10-CM | POA: Diagnosis not present

## 2017-06-16 DIAGNOSIS — G309 Alzheimer's disease, unspecified: Secondary | ICD-10-CM | POA: Diagnosis not present

## 2017-06-16 DIAGNOSIS — I10 Essential (primary) hypertension: Secondary | ICD-10-CM | POA: Diagnosis not present

## 2017-06-16 DIAGNOSIS — R278 Other lack of coordination: Secondary | ICD-10-CM | POA: Diagnosis not present

## 2017-06-16 DIAGNOSIS — R2681 Unsteadiness on feet: Secondary | ICD-10-CM | POA: Diagnosis not present

## 2017-06-16 DIAGNOSIS — M21371 Foot drop, right foot: Secondary | ICD-10-CM | POA: Diagnosis not present

## 2017-06-16 DIAGNOSIS — Z9181 History of falling: Secondary | ICD-10-CM | POA: Diagnosis not present

## 2017-06-16 DIAGNOSIS — I679 Cerebrovascular disease, unspecified: Secondary | ICD-10-CM | POA: Diagnosis not present

## 2017-06-16 DIAGNOSIS — F329 Major depressive disorder, single episode, unspecified: Secondary | ICD-10-CM | POA: Diagnosis not present

## 2017-06-16 DIAGNOSIS — K219 Gastro-esophageal reflux disease without esophagitis: Secondary | ICD-10-CM | POA: Diagnosis not present

## 2017-06-16 DIAGNOSIS — Z7409 Other reduced mobility: Secondary | ICD-10-CM | POA: Diagnosis not present

## 2017-06-16 DIAGNOSIS — G609 Hereditary and idiopathic neuropathy, unspecified: Secondary | ICD-10-CM | POA: Diagnosis not present

## 2017-06-16 DIAGNOSIS — J984 Other disorders of lung: Secondary | ICD-10-CM | POA: Diagnosis not present

## 2017-06-16 DIAGNOSIS — R29898 Other symptoms and signs involving the musculoskeletal system: Secondary | ICD-10-CM | POA: Diagnosis not present

## 2017-06-16 DIAGNOSIS — M6281 Muscle weakness (generalized): Secondary | ICD-10-CM | POA: Diagnosis not present

## 2017-06-16 DIAGNOSIS — K648 Other hemorrhoids: Secondary | ICD-10-CM | POA: Diagnosis not present

## 2017-06-16 DIAGNOSIS — R609 Edema, unspecified: Secondary | ICD-10-CM | POA: Diagnosis not present

## 2017-06-16 DIAGNOSIS — K5900 Constipation, unspecified: Secondary | ICD-10-CM | POA: Diagnosis not present

## 2017-06-16 DIAGNOSIS — M25562 Pain in left knee: Secondary | ICD-10-CM | POA: Diagnosis not present

## 2017-06-16 DIAGNOSIS — G250 Essential tremor: Secondary | ICD-10-CM | POA: Diagnosis not present

## 2017-06-16 DIAGNOSIS — M21372 Foot drop, left foot: Secondary | ICD-10-CM | POA: Diagnosis not present

## 2017-06-16 DIAGNOSIS — M4800 Spinal stenosis, site unspecified: Secondary | ICD-10-CM | POA: Diagnosis not present

## 2017-06-16 DIAGNOSIS — I2789 Other specified pulmonary heart diseases: Secondary | ICD-10-CM | POA: Diagnosis not present

## 2017-06-17 DIAGNOSIS — R2681 Unsteadiness on feet: Secondary | ICD-10-CM | POA: Diagnosis not present

## 2017-06-17 DIAGNOSIS — Z9181 History of falling: Secondary | ICD-10-CM | POA: Diagnosis not present

## 2017-06-17 DIAGNOSIS — R29898 Other symptoms and signs involving the musculoskeletal system: Secondary | ICD-10-CM | POA: Diagnosis not present

## 2017-06-17 DIAGNOSIS — R4789 Other speech disturbances: Secondary | ICD-10-CM | POA: Diagnosis not present

## 2017-06-17 DIAGNOSIS — M6281 Muscle weakness (generalized): Secondary | ICD-10-CM | POA: Diagnosis not present

## 2017-06-17 DIAGNOSIS — Z7409 Other reduced mobility: Secondary | ICD-10-CM | POA: Diagnosis not present

## 2017-06-18 DIAGNOSIS — R29898 Other symptoms and signs involving the musculoskeletal system: Secondary | ICD-10-CM | POA: Diagnosis not present

## 2017-06-18 DIAGNOSIS — M6281 Muscle weakness (generalized): Secondary | ICD-10-CM | POA: Diagnosis not present

## 2017-06-18 DIAGNOSIS — Z9181 History of falling: Secondary | ICD-10-CM | POA: Diagnosis not present

## 2017-06-18 DIAGNOSIS — R4789 Other speech disturbances: Secondary | ICD-10-CM | POA: Diagnosis not present

## 2017-06-18 DIAGNOSIS — Z7409 Other reduced mobility: Secondary | ICD-10-CM | POA: Diagnosis not present

## 2017-06-18 DIAGNOSIS — R2681 Unsteadiness on feet: Secondary | ICD-10-CM | POA: Diagnosis not present

## 2017-06-19 DIAGNOSIS — Z7409 Other reduced mobility: Secondary | ICD-10-CM | POA: Diagnosis not present

## 2017-06-19 DIAGNOSIS — M6281 Muscle weakness (generalized): Secondary | ICD-10-CM | POA: Diagnosis not present

## 2017-06-19 DIAGNOSIS — Z9181 History of falling: Secondary | ICD-10-CM | POA: Diagnosis not present

## 2017-06-19 DIAGNOSIS — R29898 Other symptoms and signs involving the musculoskeletal system: Secondary | ICD-10-CM | POA: Diagnosis not present

## 2017-06-19 DIAGNOSIS — R4789 Other speech disturbances: Secondary | ICD-10-CM | POA: Diagnosis not present

## 2017-06-19 DIAGNOSIS — R2681 Unsteadiness on feet: Secondary | ICD-10-CM | POA: Diagnosis not present

## 2017-06-23 DIAGNOSIS — R29898 Other symptoms and signs involving the musculoskeletal system: Secondary | ICD-10-CM | POA: Diagnosis not present

## 2017-06-23 DIAGNOSIS — R2681 Unsteadiness on feet: Secondary | ICD-10-CM | POA: Diagnosis not present

## 2017-06-23 DIAGNOSIS — Z9181 History of falling: Secondary | ICD-10-CM | POA: Diagnosis not present

## 2017-06-23 DIAGNOSIS — M6281 Muscle weakness (generalized): Secondary | ICD-10-CM | POA: Diagnosis not present

## 2017-06-23 DIAGNOSIS — R4789 Other speech disturbances: Secondary | ICD-10-CM | POA: Diagnosis not present

## 2017-06-23 DIAGNOSIS — Z7409 Other reduced mobility: Secondary | ICD-10-CM | POA: Diagnosis not present

## 2017-06-24 DIAGNOSIS — Z9181 History of falling: Secondary | ICD-10-CM | POA: Diagnosis not present

## 2017-06-24 DIAGNOSIS — Z7409 Other reduced mobility: Secondary | ICD-10-CM | POA: Diagnosis not present

## 2017-06-24 DIAGNOSIS — R2681 Unsteadiness on feet: Secondary | ICD-10-CM | POA: Diagnosis not present

## 2017-06-24 DIAGNOSIS — R29898 Other symptoms and signs involving the musculoskeletal system: Secondary | ICD-10-CM | POA: Diagnosis not present

## 2017-06-24 DIAGNOSIS — R4789 Other speech disturbances: Secondary | ICD-10-CM | POA: Diagnosis not present

## 2017-06-24 DIAGNOSIS — M6281 Muscle weakness (generalized): Secondary | ICD-10-CM | POA: Diagnosis not present

## 2017-06-25 DIAGNOSIS — R29898 Other symptoms and signs involving the musculoskeletal system: Secondary | ICD-10-CM | POA: Diagnosis not present

## 2017-06-25 DIAGNOSIS — Z9181 History of falling: Secondary | ICD-10-CM | POA: Diagnosis not present

## 2017-06-25 DIAGNOSIS — M6281 Muscle weakness (generalized): Secondary | ICD-10-CM | POA: Diagnosis not present

## 2017-06-25 DIAGNOSIS — R2681 Unsteadiness on feet: Secondary | ICD-10-CM | POA: Diagnosis not present

## 2017-06-25 DIAGNOSIS — Z7409 Other reduced mobility: Secondary | ICD-10-CM | POA: Diagnosis not present

## 2017-06-25 DIAGNOSIS — R4789 Other speech disturbances: Secondary | ICD-10-CM | POA: Diagnosis not present

## 2017-06-26 DIAGNOSIS — M6281 Muscle weakness (generalized): Secondary | ICD-10-CM | POA: Diagnosis not present

## 2017-06-26 DIAGNOSIS — Z7409 Other reduced mobility: Secondary | ICD-10-CM | POA: Diagnosis not present

## 2017-06-26 DIAGNOSIS — R4789 Other speech disturbances: Secondary | ICD-10-CM | POA: Diagnosis not present

## 2017-06-26 DIAGNOSIS — R2681 Unsteadiness on feet: Secondary | ICD-10-CM | POA: Diagnosis not present

## 2017-06-26 DIAGNOSIS — R29898 Other symptoms and signs involving the musculoskeletal system: Secondary | ICD-10-CM | POA: Diagnosis not present

## 2017-06-26 DIAGNOSIS — Z9181 History of falling: Secondary | ICD-10-CM | POA: Diagnosis not present

## 2017-06-27 DIAGNOSIS — Z9181 History of falling: Secondary | ICD-10-CM | POA: Diagnosis not present

## 2017-06-27 DIAGNOSIS — M6281 Muscle weakness (generalized): Secondary | ICD-10-CM | POA: Diagnosis not present

## 2017-06-27 DIAGNOSIS — R4789 Other speech disturbances: Secondary | ICD-10-CM | POA: Diagnosis not present

## 2017-06-27 DIAGNOSIS — Z7409 Other reduced mobility: Secondary | ICD-10-CM | POA: Diagnosis not present

## 2017-06-27 DIAGNOSIS — R2681 Unsteadiness on feet: Secondary | ICD-10-CM | POA: Diagnosis not present

## 2017-06-27 DIAGNOSIS — R29898 Other symptoms and signs involving the musculoskeletal system: Secondary | ICD-10-CM | POA: Diagnosis not present

## 2017-06-30 DIAGNOSIS — R2681 Unsteadiness on feet: Secondary | ICD-10-CM | POA: Diagnosis not present

## 2017-06-30 DIAGNOSIS — Z7409 Other reduced mobility: Secondary | ICD-10-CM | POA: Diagnosis not present

## 2017-06-30 DIAGNOSIS — M6281 Muscle weakness (generalized): Secondary | ICD-10-CM | POA: Diagnosis not present

## 2017-06-30 DIAGNOSIS — R4789 Other speech disturbances: Secondary | ICD-10-CM | POA: Diagnosis not present

## 2017-06-30 DIAGNOSIS — R29898 Other symptoms and signs involving the musculoskeletal system: Secondary | ICD-10-CM | POA: Diagnosis not present

## 2017-06-30 DIAGNOSIS — Z9181 History of falling: Secondary | ICD-10-CM | POA: Diagnosis not present

## 2017-07-02 DIAGNOSIS — R2681 Unsteadiness on feet: Secondary | ICD-10-CM | POA: Diagnosis not present

## 2017-07-02 DIAGNOSIS — Z7409 Other reduced mobility: Secondary | ICD-10-CM | POA: Diagnosis not present

## 2017-07-02 DIAGNOSIS — M6281 Muscle weakness (generalized): Secondary | ICD-10-CM | POA: Diagnosis not present

## 2017-07-02 DIAGNOSIS — Z9181 History of falling: Secondary | ICD-10-CM | POA: Diagnosis not present

## 2017-07-02 DIAGNOSIS — R4789 Other speech disturbances: Secondary | ICD-10-CM | POA: Diagnosis not present

## 2017-07-02 DIAGNOSIS — R29898 Other symptoms and signs involving the musculoskeletal system: Secondary | ICD-10-CM | POA: Diagnosis not present

## 2017-07-14 ENCOUNTER — Encounter: Payer: Self-pay | Admitting: Internal Medicine

## 2017-07-14 ENCOUNTER — Non-Acute Institutional Stay (SKILLED_NURSING_FACILITY): Payer: Medicare Other | Admitting: Internal Medicine

## 2017-07-14 DIAGNOSIS — F329 Major depressive disorder, single episode, unspecified: Secondary | ICD-10-CM

## 2017-07-14 DIAGNOSIS — G309 Alzheimer's disease, unspecified: Secondary | ICD-10-CM

## 2017-07-14 DIAGNOSIS — K219 Gastro-esophageal reflux disease without esophagitis: Secondary | ICD-10-CM

## 2017-07-14 DIAGNOSIS — I739 Peripheral vascular disease, unspecified: Secondary | ICD-10-CM | POA: Diagnosis not present

## 2017-07-14 DIAGNOSIS — F0281 Dementia in other diseases classified elsewhere with behavioral disturbance: Secondary | ICD-10-CM

## 2017-07-14 DIAGNOSIS — I10 Essential (primary) hypertension: Secondary | ICD-10-CM | POA: Diagnosis not present

## 2017-07-14 DIAGNOSIS — R269 Unspecified abnormalities of gait and mobility: Secondary | ICD-10-CM

## 2017-07-14 LAB — CMP 10231
ALBUMIN: 3.2
CHLORIDE: 104
Calcium: 9.3
Carbon Dioxide, Total: 30
EGFR (Non-African Amer.): 63
GLOBULIN: 3.3
TOTAL PROTEIN: 6.5

## 2017-07-14 NOTE — Progress Notes (Signed)
Location:  [facility] Room Number: 32 Place of Service:  SNF [phone]) Provider:  Blanchie Serve MD  Blanchie Serve, MD  Patient Care Team: Blanchie Serve, MD as PCP - General (Internal Medicine) Jettie Booze, MD as Consulting Physician (Cardiology) Marchia Bond, MD as Consulting Physician (Orthopedic Surgery) Kathrynn Ducking, MD as Consulting Physician (Neurology) Brand Males, MD as Consulting Physician (Pulmonary Disease) Ngetich, Nelda Bucks, NP as Nurse Practitioner (Family Medicine)  Extended Emergency Contact Information Primary Emergency Contact: [place] of [place] Phone: [phone] Relation: Son Secondary Emergency Contact: Ricardo Jericho [place] of [place] Phone: [phone] Mobile Phone: [phone] Relation: Daughter  Code Status:  DNR  Goals of care: Advanced Directive information Advanced Directives 07/14/2017  Does Patient Have a Medical Advance Directive? Yes  Type of Paramedic of [place];Living will;Out of facility DNR (pink MOST or yellow form)  Does patient want to make changes to medical advance directive? No - Patient declined  Copy of [place] in Chart? Yes  Pre-existing out of facility DNR order (yellow form or pink MOST form) Yellow form placed in chart (order not valid for inpatient use)     Chief Complaint  Patient presents with  . Medical Management of Chronic Issues    Routine Visit     HPI:  Pt is a 84 y.o. female seen today for medical management of chronic diseases. She is pleasantly confused and denies any health concern this visit. Her HPI and ROS limited some with her dementia. She ambulates with her wheelchair and wheels herself, no recent fall reported. Last fall 06/13/17 with no apparent injury. she takes her feeding supplement. She wears her compression stockings and takes lasix. Behavior overall stable per nursing with  quetiapine. Denies any reflux symptom this visit. Takes her dementia medications as well. No acute concern from nursing. Patient is seen in her room today. She currently works with OT team for upper strength training.    Past Medical History:  Diagnosis Date  . Alzheimer's disease 06/22/2015  . Anemia    iron deficient  . Anemia   . Anxiety   . Chronic airway obstruction, not elsewhere classified   . Chronic back pain   . Chronic cystitis   . Cystitis, chronic    Dr Gaynelle Arabian  . Degenerative arthritis   . Depression   . Disturbance of skin sensation   . Disturbance of skin sensation   . Dyslipidemia   . Dyspnea    Dr Chase Caller  . Esophageal reflux   . Essential and other specified forms of tremor 12/11/2012  . Foot drop, bilateral 12/21/2013  . Gait disorder   . GERD (gastroesophageal reflux disease)   . Hemorrhoids   . Hereditary and idiopathic peripheral neuropathy 10/11/2015  . Hernia   . History of cerebrovascular disease    Moderate level small vessel disease  . Hyperlipidemia    mild  . Hypertension   . Osteoarthritis    right knee injection per Dr Trudie Reed  . Osteoporosis   . Other abnormal clinical finding   . Pain in joint, upper arm   . Peripheral vascular disease ([place])   . Rheumatoid arthritis ([place])    Dr Trudie Reed  . Tobacco use disorder   . Tremor    and gait disorder--Dr Jannifer Franklin  . Unspecified vitamin D deficiency    Past Surgical History:  Procedure Laterality Date  . APPENDECTOMY    . BACK SURGERY Bilateral  X2  . Bladder resuspension procedure    . CATARACT EXTRACTION Bilateral   . FEMUR IM NAIL Right 02/22/2014   Procedure: INTRAMEDULLARY (IM) NAIL FEMORAL;  Surgeon: Johnny Bridge, MD;  Location: [facility];  Service: Orthopedics;  Laterality: Right;  . INGUINAL HERNIA REPAIR Bilateral   . ORIF ANKLE FRACTURE Left 09/19/2016   Procedure: OPEN REDUCTION INTERNAL FIXATION (ORIF) ANKLE FRACTURE;  Surgeon: Vickey Huger, MD;  Location: [facility];  Service:  Orthopedics;  Laterality: Left;  . TONSILLECTOMY      Allergies  Allergen Reactions  . Novocain [Procaine]     Passed  out  . Limbrel [Flavocoxid]     Dizziness   . Sertraline Anxiety    Outpatient Encounter Medications as of 07/14/2017  Medication Sig  . acetaminophen (TYLENOL) 650 MG CR tablet Take 650 mg by mouth 2 (two) times daily.   Marland Kitchen alum & mag hydroxide-simeth (MAALOX/MYLANTA) 200-200-20 MG/5ML suspension Take 30 mLs by mouth every 4 (four) hours as needed for indigestion or heartburn.  . Amino Acids-Protein Hydrolys (FEEDING SUPPLEMENT, PRO-STAT SUGAR FREE 64,) LIQD Take 30 mLs by mouth 2 (two) times daily.   . Calcium Carbonate-Vitamin D (CALTRATE 600+D) 600-400 MG-UNIT per tablet Take 1 tablet by mouth daily.  Marland Kitchen donepezil (ARICEPT) 10 MG tablet Take 10 mg by mouth at bedtime.  . furosemide (LASIX) 20 MG tablet Take 20 mg by mouth daily.  Marland Kitchen Hyoscyamine Sulfate SL (LEVSIN/SL) 0.125 MG SUBL Place under the tongue every 12 (twelve) hours as needed (for cramps).   . LORazepam (ATIVAN) 0.5 MG tablet Take 0.5 mg by mouth 2 (two) times daily.   . magnesium hydroxide (MILK OF MAGNESIA) 400 MG/5ML suspension Take 30 mLs by mouth daily as needed for mild constipation.  . memantine (NAMENDA) 10 MG tablet Take 10 mg by mouth 2 (two) times daily.  . metoprolol tartrate (LOPRESSOR) 25 MG tablet Take 12.5 mg by mouth 2 (two) times daily. Hold for SBP <110 or HR <60/min  . mirtazapine (REMERON) 15 MG tablet Take 15 mg by mouth at bedtime.  . Misc Natural Products (OSTEO BI-FLEX TRIPLE STRENGTH) TABS Take 1 tablet by mouth daily.  . Multiple Vitamin (MULTIVITAMIN) tablet Take 1 tablet by mouth daily.  . Nutritional Supplements (BENECALORIE PO) Take 1 oz by mouth daily.  . Nutritional Supplements (RESOURCE 2.0) LIQD Take 120 mLs by mouth 3 (three) times daily.  Marland Kitchen omeprazole (PRILOSEC) 20 MG capsule Take 20 mg by mouth daily.  . ondansetron (ZOFRAN) 4 MG tablet Take 4 mg by mouth every 6 (six)  hours as needed for nausea or vomiting.  Marland Kitchen QUEtiapine (SEROQUEL) 25 MG tablet Take 25 mg by mouth 2 (two) times daily. At 9AM and 1PM  . ranitidine (ZANTAC) 150 MG tablet Take 150 mg by mouth at bedtime.   . sennosides-docusate sodium (SENOKOT-S) 8.6-50 MG tablet Take 2 tablets by mouth daily.   No facility-administered encounter medications on file as of 07/14/2017.     Review of Systems  Unable to perform ROS: Dementia (limited)  Constitutional: Negative for appetite change and fever.  HENT: Positive for hearing loss. Negative for congestion and rhinorrhea.   Respiratory: Negative for cough and shortness of breath.   Cardiovascular: Negative for chest pain.  Gastrointestinal: Positive for constipation. Negative for abdominal pain, nausea and vomiting.  Genitourinary: Negative for dysuria.  Musculoskeletal: Positive for gait problem.  Skin: Negative for wound.  Neurological: Negative for dizziness and headaches.  Psychiatric/Behavioral: Positive for confusion.  Immunization History  Administered Date(s) Administered  . Influenza Whole 01/16/2010  . Influenza-Unspecified 02/03/2006, 01/09/2007, 01/05/2013, 12/22/2014, 12/28/2015, 12/26/2016  . PPD Test 05/01/2004, 02/24/2014  . Pneumococcal Polysaccharide-23 01/21/2006  . Pneumococcal-Unspecified 12/19/1998  . Td 03/18/1998  . Tdap 09/18/2016   Pertinent  Health Maintenance Due  Topic Date Due  . PNA vac Low Risk Adult (2 of 2 - PCV13) 01/22/2007  . INFLUENZA VACCINE  10/16/2017  . DEXA SCAN  Completed   Fall Risk  10/28/2016 06/20/2015  Falls in the past year? No No  Risk for fall due to : - Impaired balance/gait   Functional Status Survey:    Vitals:   07/14/17 1138  BP: (!) 148/76  Pulse: 90  Resp: 20  Temp: 98 F (36.7 C)  TempSrc: Oral  SpO2: 94%  Weight: 118 lb (53.5 kg)  Height: 5\' 1"  (1.549 m)   Body mass index is 22.3 kg/m.   Wt Readings from Last 3 Encounters:  07/14/17 118 lb (53.5 kg)  06/10/17  122 lb 4.8 oz (55.5 kg)  05/13/17 118 lb 1.6 oz (53.6 kg)   Physical Exam  Constitutional: She appears well-developed and well-nourished. No distress.  HENT:  Head: Normocephalic and atraumatic.  Right Ear: External ear normal.  Left Ear: External ear normal.  Nose: Nose normal.  Mouth/Throat: Oropharynx is clear and moist.  Eyes: Pupils are equal, round, and reactive to light. Conjunctivae and EOM are normal. Right eye exhibits no discharge. Left eye exhibits no discharge.  Neck: Normal range of motion. Neck supple.  Cardiovascular: Normal rate, regular rhythm and intact distal pulses.  Pulmonary/Chest: Effort normal and breath sounds normal. No respiratory distress. She has no wheezes. She has no rales.  Abdominal: Soft. Bowel sounds are normal. She exhibits no distension. There is no tenderness. There is no guarding.  Musculoskeletal: She exhibits edema.  Adequate ROM with her upper and lower extremities, wheelchair for ambulation and walker with assistance. Ted hose to both legs  Lymphadenopathy:    She has no cervical adenopathy.  Neurological: She is alert.  Oriented to self and place  Skin: Skin is warm and dry. She is not diaphoretic.  Psychiatric:  Pleasantly confused    Labs reviewed: Recent Labs    09/18/16 2232 09/18/16 2239  11/04/16 12/12/16 05/15/17  NA 137 136   < > 142 139  139 140  140  K 4.6 4.5   < > 3.6 4.0  4.0 4.2  4.2  CL 99* 96*  --   --  103 104  CO2 33*  --   --   --  28 30  GLUCOSE 115* 110*  --   --   --   --   BUN 24* 30*   < > 14 24*  24* 25*  CREATININE 1.06* 1.00   < > 0.9 0.9  0.93 0.8  0.82  CALCIUM 9.4  --   --   --  9.4 9.3  9.3  MG  --   --   --   --   --  2.1   < > = values in this interval not displayed.   Recent Labs    12/12/16 05/15/17  AST 17  17 16  16   ALT 8  8 8  8   ALKPHOS 79  79 70  70  BILITOT 0.5 0.3  PROT 6.6 6.5  6.5  ALBUMIN 3.2 3.2  3.2   Recent Labs    09/18/16 2232  09/26/16  12/12/16  05/15/17  WBC 11.9*   < > 10.5 9.8  9.8 9.6  9.6  NEUTROABS 8.4*  --   --   --   --   HGB 13.0   < > 13.2 13.6  13.6 13.0  13.0  HCT 39.5   < > 40 42  42 39  38.8  MCV 99.7  --   --   --   --   PLT 358  --  447* 350 332   < > = values in this interval not displayed.   Lab Results  Component Value Date   TSH 1.09 05/15/2017   TSH 1.09 05/15/2017   Lab Results  Component Value Date   HGBA1C 5.8 12/16/2016   Lab Results  Component Value Date   CHOL 174 12/16/2016   CHOL 174 12/16/2016   HDL 68 12/16/2016   HDL 68 12/16/2016   LDLCALC 80 12/16/2016   LDLCALC 80 12/16/2016   TRIG 159 12/16/2016    Significant Diagnostic Results in last 30 days:  No results found.  Assessment/Plan  1. PVD (peripheral vascular disease) (HCC) Stable, continue ted hose and lasix 20 mg daily. BMP reviewed.   2. Essential hypertension Controlled BP with average SBP in 120-130s range. Continue metoprolol tartrate 12.5 mg bid and lasix 20 mg daily. BMP reviewed.   3. Gastroesophageal reflux disease without esophagitis Continue omeprazole and ranitidine. monitor  4. Alzheimer's dementia with behavioral disturbance, unspecified timing of dementia onset Continue memantine 10 mg bid and donepezil 10 mg daily. Supportive care. Continue seroquel daily and lorazepam bid  5. Gait disorder Fall precautions. Wheelchair for ambulation, high fall risk, continue to work with OT.   6. Major depression, chronic Continue remeron 15 mg daily, stable mood, monitor    Family/ staff Communication: reviewed care plan with patient and charge nurse.    Labs/tests ordered:  none   Blanchie Serve, MD Internal Medicine [facility] [address] [place], [place] [address] Cell Phone (Monday-Friday 8 am - 5 pm): [phone] On Call: [phone] and follow prompts after 5 pm and on weekends Office Phone: [phone] Office Fax: [phone]

## 2017-07-31 ENCOUNTER — Encounter: Payer: Self-pay | Admitting: Family

## 2017-07-31 ENCOUNTER — Non-Acute Institutional Stay (SKILLED_NURSING_FACILITY): Payer: Medicare Other | Admitting: Family

## 2017-07-31 DIAGNOSIS — S81812A Laceration without foreign body, left lower leg, initial encounter: Secondary | ICD-10-CM

## 2017-07-31 NOTE — Progress Notes (Addendum)
Location:  [facility] Room Number: 32 Place of Service:  SNF (31) Provider: Tariya Morrissette FNP-C  Blanchie Serve, MD  Patient Care Team: Blanchie Serve, MD as PCP - General (Internal Medicine) Jettie Booze, MD as Consulting Physician (Cardiology) Marchia Bond, MD as Consulting Physician (Orthopedic Surgery) Kathrynn Ducking, MD as Consulting Physician (Neurology) Brand Males, MD as Consulting Physician (Pulmonary Disease) Shivangi Lutz, Nelda Bucks, NP as Nurse Practitioner (Family Medicine)  Extended Emergency Contact Information Primary Emergency Contact: [place] of [place] Phone: [phone] Relation: Son Secondary Emergency Contact: Ricardo Jericho [place] of [place] Phone: [phone] Mobile Phone: [phone] Relation: Daughter  Code Status:  DNR Goals of care: Advanced Directive information Advanced Directives 07/31/2017  Does Patient Have a Medical Advance Directive? Yes  Type of Paramedic of [place];Out of facility DNR (pink MOST or yellow form);Living will  Does patient want to make changes to medical advance directive? -  Copy of [place] in Chart? Yes  Pre-existing out of facility DNR order (yellow form or pink MOST form) Yellow form placed in chart (order not valid for inpatient use)     Chief Complaint  Patient presents with  . Acute Visit    skin tear left calf    HPI:  Pt is a 84 y.o. female seen today at [facility] for an acute visit for evaluation of left calf area skin tear x 2.she is seen in her room today.she denies any new acute issues.Facility Nurse reports patient's CNA observed x 2 skin tears to left leg when assisting patient to bed.Nurse cleansed skin tear with saline,triple antibiotic ointment applied and covered with non-adhesive gauze per standing orders.Patient unable to provider HPI and ROS due to cognitive impairment.No  fever,chills or fall episode reported.    Past Medical History:  Diagnosis Date  . Alzheimer's disease 06/22/2015  . Anemia    iron deficient  . Anemia   . Anxiety   . Chronic airway obstruction, not elsewhere classified   . Chronic back pain   . Chronic cystitis   . Cystitis, chronic    Dr Gaynelle Arabian  . Degenerative arthritis   . Depression   . Disturbance of skin sensation   . Disturbance of skin sensation   . Dyslipidemia   . Dyspnea    Dr Chase Caller  . Esophageal reflux   . Essential and other specified forms of tremor 12/11/2012  . Foot drop, bilateral 12/21/2013  . Gait disorder   . GERD (gastroesophageal reflux disease)   . Hemorrhoids   . Hereditary and idiopathic peripheral neuropathy 10/11/2015  . Hernia   . History of cerebrovascular disease    Moderate level small vessel disease  . Hyperlipidemia    mild  . Hypertension   . Osteoarthritis    right knee injection per Dr Trudie Reed  . Osteoporosis   . Other abnormal clinical finding   . Pain in joint, upper arm   . Peripheral vascular disease ([place])   . Rheumatoid arthritis ([place])    Dr Trudie Reed  . Tobacco use disorder   . Tremor    and gait disorder--Dr Jannifer Franklin  . Unspecified vitamin D deficiency    Past Surgical History:  Procedure Laterality Date  . APPENDECTOMY    . BACK SURGERY Bilateral    X2  . Bladder resuspension procedure    . CATARACT EXTRACTION Bilateral   . FEMUR IM NAIL Right 02/22/2014   Procedure: INTRAMEDULLARY (IM)  NAIL FEMORAL;  Surgeon: Johnny Bridge, MD;  Location: [facility];  Service: Orthopedics;  Laterality: Right;  . INGUINAL HERNIA REPAIR Bilateral   . ORIF ANKLE FRACTURE Left 09/19/2016   Procedure: OPEN REDUCTION INTERNAL FIXATION (ORIF) ANKLE FRACTURE;  Surgeon: Vickey Huger, MD;  Location: [facility];  Service: Orthopedics;  Laterality: Left;  . TONSILLECTOMY      Allergies  Allergen Reactions  . Novocain [Procaine]     Passed  out  . Limbrel [Flavocoxid]     Dizziness   .  Sertraline Anxiety    Outpatient Encounter Medications as of 07/31/2017  Medication Sig  . acetaminophen (TYLENOL) 650 MG CR tablet Take 650 mg by mouth 2 (two) times daily.   Marland Kitchen alum & mag hydroxide-simeth (MAALOX/MYLANTA) 200-200-20 MG/5ML suspension Take 30 mLs by mouth every 4 (four) hours as needed for indigestion or heartburn.  . Amino Acids-Protein Hydrolys (FEEDING SUPPLEMENT, PRO-STAT SUGAR FREE 64,) LIQD Take 30 mLs by mouth 2 (two) times daily.   . Calcium Carbonate-Vitamin D (CALTRATE 600+D) 600-400 MG-UNIT per tablet Take 1 tablet by mouth daily.  Marland Kitchen donepezil (ARICEPT) 10 MG tablet Take 10 mg by mouth at bedtime.  . furosemide (LASIX) 20 MG tablet Take 20 mg by mouth daily.  Marland Kitchen Hyoscyamine Sulfate SL (LEVSIN/SL) 0.125 MG SUBL Place under the tongue every 12 (twelve) hours as needed (for cramps).   . LORazepam (ATIVAN) 0.5 MG tablet Take 0.5 mg by mouth 2 (two) times daily.   . magnesium hydroxide (MILK OF MAGNESIA) 400 MG/5ML suspension Take 30 mLs by mouth daily as needed for mild constipation.  . memantine (NAMENDA) 10 MG tablet Take 10 mg by mouth 2 (two) times daily.  . metoprolol tartrate (LOPRESSOR) 25 MG tablet Take 12.5 mg by mouth 2 (two) times daily. Hold for SBP <110 or HR <60/min  . mirtazapine (REMERON) 15 MG tablet Take 15 mg by mouth at bedtime.  . Misc Natural Products (OSTEO BI-FLEX TRIPLE STRENGTH) TABS Take 1 tablet by mouth daily.  . Multiple Vitamin (MULTIVITAMIN) tablet Take 1 tablet by mouth daily.  . Nutritional Supplements (BENECALORIE PO) Take 1 oz by mouth daily.  . Nutritional Supplements (RESOURCE 2.0) LIQD Take 120 mLs by mouth 3 (three) times daily.  Marland Kitchen omeprazole (PRILOSEC) 20 MG capsule Take 20 mg by mouth daily.  . ondansetron (ZOFRAN) 4 MG tablet Take 4 mg by mouth every 6 (six) hours as needed for nausea or vomiting.  Marland Kitchen QUEtiapine (SEROQUEL) 25 MG tablet Take 25 mg by mouth 2 (two) times daily. At 9AM and 1PM  . ranitidine (ZANTAC) 150 MG tablet  Take 150 mg by mouth at bedtime.   . sennosides-docusate sodium (SENOKOT-S) 8.6-50 MG tablet Take 2 tablets by mouth daily.   No facility-administered encounter medications on file as of 07/31/2017.     Review of Systems  Unable to perform ROS: Dementia (additional information provided by facility Nurse )    Immunization History  Administered Date(s) Administered  . Influenza Whole 01/16/2010  . Influenza-Unspecified 02/03/2006, 01/09/2007, 01/05/2013, 12/22/2014, 12/28/2015, 12/26/2016  . PPD Test 05/01/2004, 02/24/2014  . Pneumococcal Polysaccharide-23 01/21/2006  . Pneumococcal-Unspecified 12/19/1998  . Td 03/18/1998  . Tdap 09/18/2016   Pertinent  Health Maintenance Due  Topic Date Due  . PNA vac Low Risk Adult (2 of 2 - PCV13) 01/22/2007  . INFLUENZA VACCINE  10/16/2017  . DEXA SCAN  Completed   Fall Risk  10/28/2016 06/20/2015  Falls in the past year? No No  Risk for fall due to : - Impaired balance/gait    Vitals:   07/31/17 1309  BP: 135/90  Pulse: 92  Resp: 18  Temp: 98.4 F (36.9 C)  SpO2: 97%  Weight: 124 lb 8 oz (56.5 kg)  Height: 5\' 1"  (1.549 m)   Body mass index is 23.52 kg/m. Physical Exam  Constitutional:  Frail elderly in no acute distress  HENT:  Head: Normocephalic.  Right Ear: External ear normal.  Left Ear: External ear normal.  Mouth/Throat: Oropharynx is clear and moist. No oropharyngeal exudate.  Eyes: Pupils are equal, round, and reactive to light. Right eye exhibits no discharge. Left eye exhibits no discharge. No scleral icterus.  Neck: Neck supple. No JVD present. No thyromegaly present.  Cardiovascular: Normal rate, regular rhythm, normal heart sounds and intact distal pulses. Exam reveals no gallop and no friction rub.  No murmur heard. Pulmonary/Chest: Effort normal and breath sounds normal. No stridor. No respiratory distress. She has no wheezes. She has no rales.  Abdominal: Soft. Bowel sounds are normal. She exhibits no distension  and no mass. There is no tenderness. There is no rebound and no guarding.  Musculoskeletal: She exhibits no tenderness.  Unsteady gait spends most time on wheelchair and ambulates with walker with assistance.bilateral lower extremities trace edema. Knee high ted hose in place   Lymphadenopathy:    She has no cervical adenopathy.  Neurological:  Pleasantly confused at baseline.   Skin: Skin is warm and dry. No rash noted. No erythema. No pallor.  Left upper lateral calf muscle skin tear without any flap,wound bed red in color.surrounding skin tissues without any signs of infections.   Psychiatric: She has a normal mood and affect. Her behavior is normal.  Nursing note and vitals reviewed.  Labs reviewed: Recent Labs    09/18/16 2232 09/18/16 2239  11/04/16 12/12/16 05/15/17  NA 137 136   < > 142 139  139 140  140  K 4.6 4.5   < > 3.6 4.0  4.0 4.2  4.2  CL 99* 96*  --   --  103 104  CO2 33*  --   --   --  28 30  GLUCOSE 115* 110*  --   --   --   --   BUN 24* 30*   < > 14 24*  24* 25*  CREATININE 1.06* 1.00   < > 0.9 0.9  0.93 0.8  0.82  CALCIUM 9.4  --   --   --  9.4 9.3  9.3  MG  --   --   --   --   --  2.1   < > = values in this interval not displayed.   Recent Labs    12/12/16 05/15/17  AST 17  17 16  16   ALT 8  8 8  8   ALKPHOS 79  79 70  70  BILITOT 0.5 0.3  PROT 6.6 6.5  6.5  ALBUMIN 3.2 3.2  3.2   Recent Labs    09/18/16 2232  09/26/16 12/12/16 05/15/17  WBC 11.9*   < > 10.5 9.8  9.8 9.6  9.6  NEUTROABS 8.4*  --   --   --   --   HGB 13.0   < > 13.2 13.6  13.6 13.0  13.0  HCT 39.5   < > 40 42  42 39  38.8  MCV 99.7  --   --   --   --  PLT 358  --  447* 350 332   < > = values in this interval not displayed.   Lab Results  Component Value Date   TSH 1.09 05/15/2017   TSH 1.09 05/15/2017   Lab Results  Component Value Date   HGBA1C 5.8 12/16/2016   Lab Results  Component Value Date   CHOL 174 12/16/2016   CHOL 174 12/16/2016   HDL 68  12/16/2016   HDL 68 12/16/2016   LDLCALC 80 12/16/2016   LDLCALC 80 12/16/2016   TRIG 159 12/16/2016    Significant Diagnostic Results in last 30 days:  No results found.  Assessment/Plan  Noninfected skin tear of left leg, initial encounter Afebrile.unknow cause.No skin flap to approximate edges.Wound bed red in color without any drainage.Geri-sleeves to UE's and Ted hose to lower extremities.surrounding skin tissue without any signs of infections.continue to monitor for signs of infections.continue to cleanse with saline,pat dry ,apply TAO and cover with gauze.  Family/ staff Communication: Reviewed plan of care with patient and facility Nurse supervisor  Labs/tests ordered: None   Sandrea Hughs, NP

## 2017-08-07 ENCOUNTER — Non-Acute Institutional Stay (SKILLED_NURSING_FACILITY): Payer: Medicare Other | Admitting: Family

## 2017-08-07 ENCOUNTER — Encounter: Payer: Self-pay | Admitting: Family

## 2017-08-07 DIAGNOSIS — F411 Generalized anxiety disorder: Secondary | ICD-10-CM | POA: Diagnosis not present

## 2017-08-07 DIAGNOSIS — G309 Alzheimer's disease, unspecified: Secondary | ICD-10-CM

## 2017-08-07 DIAGNOSIS — F329 Major depressive disorder, single episode, unspecified: Secondary | ICD-10-CM

## 2017-08-07 DIAGNOSIS — I739 Peripheral vascular disease, unspecified: Secondary | ICD-10-CM | POA: Diagnosis not present

## 2017-08-07 DIAGNOSIS — I10 Essential (primary) hypertension: Secondary | ICD-10-CM | POA: Diagnosis not present

## 2017-08-07 DIAGNOSIS — F0281 Dementia in other diseases classified elsewhere with behavioral disturbance: Secondary | ICD-10-CM | POA: Diagnosis not present

## 2017-08-07 NOTE — Progress Notes (Signed)
Location:  [facility] Room Number: 32 Place of Service:  SNF (31) Provider: Shed Nixon FNP-C   Blanchie Serve, MD  Patient Care Team: Blanchie Serve, MD as PCP - General (Internal Medicine) Jettie Booze, MD as Consulting Physician (Cardiology) Marchia Bond, MD as Consulting Physician (Orthopedic Surgery) Kathrynn Ducking, MD as Consulting Physician (Neurology) Brand Males, MD as Consulting Physician (Pulmonary Disease) Donyae Kilner, Nelda Bucks, NP as Nurse Practitioner (Family Medicine)  Extended Emergency Contact Information Primary Emergency Contact: [place] Phone: [phone] Relation: Son Secondary Emergency Contact: Ricardo [place] Phone: [phone] Mobile Phone: [phone] Relation: Daughter  Code Status:  DNR  Goals of care: Advanced Directive information Advanced Directives 08/07/2017  Does Patient Have a Medical Advance Directive? Yes  Type of Paramedic of [place];Out of facility DNR (pink MOST or yellow form);Living will  Does patient want to make changes to medical advance directive? -  Copy of [place] in Chart? Yes  Pre-existing out of facility DNR order (yellow form or pink MOST form) Yellow form placed in chart (order not valid for inpatient use)     Chief Complaint  Patient presents with  . Medical Management of Chronic Issues    HPI:  Pt is a 84 y.o. female seen today [facility] for medical management of chronic diseases. She has a medical history of HTN, PVD,GERD,GAD,Depression among other conditions.she is seen in her room today.she is pleasantly confused but able to make needs know to staff.she denies any new acute issues during visit.she has had no recent fall episode.Her weight remains stable.skin tears to right and left leg dressing changes done by facility Nurse no signs of  infection.   Past Medical History:  Diagnosis Date  . Alzheimer's disease 06/22/2015  . Anemia    iron deficient  . Anemia   . Anxiety   . Chronic airway obstruction, not elsewhere classified   . Chronic back pain   . Chronic cystitis   . Cystitis, chronic    Dr Gaynelle Arabian  . Degenerative arthritis   . Depression   . Disturbance of skin sensation   . Disturbance of skin sensation   . Dyslipidemia   . Dyspnea    Dr Chase Caller  . Esophageal reflux   . Essential and other specified forms of tremor 12/11/2012  . Foot drop, bilateral 12/21/2013  . Gait disorder   . GERD (gastroesophageal reflux disease)   . Hemorrhoids   . Hereditary and idiopathic peripheral neuropathy 10/11/2015  . Hernia   . History of cerebrovascular disease    Moderate level small vessel disease  . Hyperlipidemia    mild  . Hypertension   . Osteoarthritis    right knee injection per Dr Trudie Reed  . Osteoporosis   . Other abnormal clinical finding   . Pain in joint, upper arm   . Peripheral vascular disease ([place])   . Rheumatoid arthritis ([place])    Dr Trudie Reed  . Tobacco use disorder   . Tremor    and gait disorder--Dr Jannifer Franklin  . Unspecified vitamin D deficiency    Past Surgical History:  Procedure Laterality Date  . APPENDECTOMY    . BACK SURGERY Bilateral    X2  . Bladder resuspension procedure    . CATARACT EXTRACTION Bilateral   . FEMUR IM NAIL Right 02/22/2014   Procedure: INTRAMEDULLARY (IM) NAIL FEMORAL;  Surgeon: Johnny Bridge, MD;  Location: [facility]  OR;  Service: Orthopedics;  Laterality: Right;  . INGUINAL HERNIA REPAIR Bilateral   . ORIF ANKLE FRACTURE Left 09/19/2016   Procedure: OPEN REDUCTION INTERNAL FIXATION (ORIF) ANKLE FRACTURE;  Surgeon: Vickey Huger, MD;  Location: [facility];  Service: Orthopedics;  Laterality: Left;  . TONSILLECTOMY      Allergies  Allergen Reactions  . Novocain [Procaine]     Passed  out  . Limbrel [Flavocoxid]     Dizziness   . Sertraline Anxiety    Allergies as  of 08/07/2017      Reactions   Novocain [procaine]    Passed  out   Limbrel [flavocoxid]    Dizziness   Sertraline Anxiety      Medication List        Accurate as of 08/07/17 12:11 PM. Always use your most recent med list.          acetaminophen 650 MG CR tablet Commonly known as:  TYLENOL Take 650 mg by mouth 2 (two) times daily.   alum & mag hydroxide-simeth 200-200-20 MG/5ML suspension Commonly known as:  MAALOX/MYLANTA Take 30 mLs by mouth every 4 (four) hours as needed for indigestion or heartburn.   BENECALORIE PO Take 1 oz by mouth daily.   RESOURCE 2.0 Liqd Take 120 mLs by mouth 3 (three) times daily.   CALTRATE 600+D 600-400 MG-UNIT tablet Generic drug:  Calcium Carbonate-Vitamin D Take 1 tablet by mouth daily.   donepezil 10 MG tablet Commonly known as:  ARICEPT Take 10 mg by mouth at bedtime.   feeding supplement (PRO-STAT SUGAR FREE 64) Liqd Take 30 mLs by mouth 2 (two) times daily.   furosemide 20 MG tablet Commonly known as:  LASIX Take 20 mg by mouth daily.   LEVSIN/SL 0.125 MG Subl Generic drug:  Hyoscyamine Sulfate SL Place under the tongue every 12 (twelve) hours as needed (for cramps).   LORazepam 0.5 MG tablet Commonly known as:  ATIVAN Take 0.5 mg by mouth 2 (two) times daily.   magnesium hydroxide 400 MG/5ML suspension Commonly known as:  MILK OF MAGNESIA Take 30 mLs by mouth daily as needed for mild constipation.   memantine 10 MG tablet Commonly known as:  NAMENDA Take 10 mg by mouth 2 (two) times daily.   metoprolol tartrate 25 MG tablet Commonly known as:  LOPRESSOR Take 12.5 mg by mouth 2 (two) times daily. Hold for SBP <110 or HR <60/min   mirtazapine 15 MG tablet Commonly known as:  REMERON Take 15 mg by mouth at bedtime.   multivitamin tablet Take 1 tablet by mouth daily.   omeprazole 20 MG capsule Commonly known as:  PRILOSEC Take 20 mg by mouth daily.   ondansetron 4 MG tablet Commonly known as:  ZOFRAN Take  4 mg by mouth every 6 (six) hours as needed for nausea or vomiting.   OSTEO BI-FLEX TRIPLE STRENGTH Tabs Take 1 tablet by mouth daily.   QUEtiapine 25 MG tablet Commonly known as:  SEROQUEL Take 25 mg by mouth 2 (two) times daily. At 9AM and 1PM   ranitidine 150 MG tablet Commonly known as:  ZANTAC Take 150 mg by mouth at bedtime.   sennosides-docusate sodium 8.6-50 MG tablet Commonly known as:  SENOKOT-S Take 2 tablets by mouth daily.       Review of Systems  Unable to perform ROS: Dementia (additional information provided by facility Nurse )  Constitutional: Negative for chills, fatigue, fever and unexpected weight change.  HENT: Positive for hearing loss.  Negative for congestion, rhinorrhea, sinus pressure, sinus pain, sneezing, sore throat and trouble swallowing.   Eyes: Negative for discharge, redness, itching and visual disturbance.  Respiratory: Negative for cough, chest tightness and shortness of breath.   Cardiovascular: Positive for leg swelling. Negative for chest pain and palpitations.  Gastrointestinal: Negative for abdominal distention, abdominal pain, constipation, diarrhea, nausea and vomiting.  Endocrine: Negative for cold intolerance, heat intolerance, polydipsia, polyphagia and polyuria.  Genitourinary: Negative for dysuria, flank pain and urgency.  Musculoskeletal: Positive for gait problem.  Skin: Negative for color change, pallor and rash.  Neurological: Negative for dizziness, light-headedness and headaches.  Hematological: Bruises/bleeds easily.  Psychiatric/Behavioral: Positive for confusion. Negative for agitation and sleep disturbance.    Immunization History  Administered Date(s) Administered  . Influenza Whole 01/16/2010  . Influenza-Unspecified 02/03/2006, 01/09/2007, 01/05/2013, 12/22/2014, 12/28/2015, 12/26/2016  . PPD Test 05/01/2004, 02/24/2014  . Pneumococcal Polysaccharide-23 01/21/2006  . Pneumococcal-Unspecified 12/19/1998  . Td  03/18/1998  . Tdap 09/18/2016   Pertinent  Health Maintenance Due  Topic Date Due  . PNA vac Low Risk Adult (2 of 2 - PCV13) 01/22/2007  . INFLUENZA VACCINE  10/16/2017  . DEXA SCAN  Completed   Fall Risk  10/28/2016 06/20/2015  Falls in the past year? No No  Risk for fall due to : - Impaired balance/gait    Vitals:   08/07/17 1100  BP: 115/71  Pulse: 89  Resp: 20  Temp: 98.1 F (36.7 C)  SpO2: 95%  Weight: 124 lb 8 oz (56.5 kg)  Height: 5\' 1"  (1.549 m)   Body mass index is 23.52 kg/m. Physical Exam  Constitutional: Vital signs are normal. She appears well-developed. She is cooperative.  Elderly in no acute distress   HENT:  Head: Normocephalic.  Right Ear: External ear normal.  Left Ear: External ear normal.  Mouth/Throat: Oropharynx is clear and moist. No oropharyngeal exudate.  Eyes: Pupils are equal, round, and reactive to light. Conjunctivae and EOM are normal. Right eye exhibits no discharge. Left eye exhibits no discharge. No scleral icterus.  Neck: Normal range of motion. No JVD present. No thyromegaly present.  Cardiovascular: Normal rate, regular rhythm, normal heart sounds and intact distal pulses. Exam reveals no gallop and no friction rub.  No murmur heard. Pulmonary/Chest: Effort normal and breath sounds normal. No respiratory distress. She has no wheezes. She has no rales.  Abdominal: Soft. Bowel sounds are normal. She exhibits no distension and no mass. There is no tenderness. There is no rebound and no guarding.  Musculoskeletal:  Moves x 4 extremities except limited right shoulder ROM due to soreness from arthritis.No tenderness.unsteady gait spends most time self propelling herself on wheelchair. Bilateral lower extremities 1-2 + edema.  Lymphadenopathy:    She has no cervical adenopathy.  Neurological: She is alert.  Skin: Skin is warm and dry. No rash noted. No erythema. No pallor.  Left and right  Leg skin tears without any signs of infections.    Psychiatric: She has a normal mood and affect. Her speech is normal. Cognition and memory are impaired.  Pleasantly confused at her baseline.  Nursing note and vitals reviewed.  Labs reviewed: Recent Labs    09/18/16 2232 09/18/16 2239  11/04/16 12/12/16 05/15/17  NA 137 136   < > 142 139  139 140  140  K 4.6 4.5   < > 3.6 4.0  4.0 4.2  4.2  CL 99* 96*  --   --  103 104  CO2  33*  --   --   --  28 30  GLUCOSE 115* 110*  --   --   --   --   BUN 24* 30*   < > 14 24*  24* 25*  CREATININE 1.06* 1.00   < > 0.9 0.9  0.93 0.8  0.82  CALCIUM 9.4  --   --   --  9.4 9.3  9.3  MG  --   --   --   --   --  2.1   < > = values in this interval not displayed.   Recent Labs    12/12/16 05/15/17  AST 17  17 16  16   ALT 8  8 8  8   ALKPHOS 79  79 70  70  BILITOT 0.5 0.3  PROT 6.6 6.5  6.5  ALBUMIN 3.2 3.2  3.2   Recent Labs    09/18/16 2232  09/26/16 12/12/16 05/15/17  WBC 11.9*   < > 10.5 9.8  9.8 9.6  9.6  NEUTROABS 8.4*  --   --   --   --   HGB 13.0   < > 13.2 13.6  13.6 13.0  13.0  HCT 39.5   < > 40 42  42 39  38.8  MCV 99.7  --   --   --   --   PLT 358  --  447* 350 332   < > = values in this interval not displayed.   Lab Results  Component Value Date   TSH 1.09 05/15/2017   TSH 1.09 05/15/2017   Lab Results  Component Value Date   HGBA1C 5.8 12/16/2016   Lab Results  Component Value Date   CHOL 174 12/16/2016   CHOL 174 12/16/2016   HDL 68 12/16/2016   HDL 68 12/16/2016   LDLCALC 80 12/16/2016   LDLCALC 80 12/16/2016   TRIG 159 12/16/2016    Significant Diagnostic Results in last 30 days:  No results found.  Assessment/Plan 1. Essential hypertension B/p and HR reviewed stable.continue on metoprolol 12.5 mg tablet twice daily and furosemide 20 mg tablet daily.monitor BMP  2. PVD (peripheral vascular disease) Bilateral lower extremities pitting edema.no ulceration noted.continue on furosemide 20 mg tablet daily.continue on knee ted hose in  the morning and off at bedtime.Not a candidate for Asprin or Plavix due to advance age and high risk for falls.  3. GAD (generalized anxiety disorder) Stable.continue on lorazepam 0.5 mg tablet twice daily.  4. Major depression, chronic Mood stable.continue on Remeron 15 mg tablet daily and Ativan as above.  5. Alzheimer's dementia with behavioral disturbance Nurse reports no new behavioral issues.continue on Aricept 10 mg tablet,Namenda 10 mg tablet twice daily,lorazepam 0.5 mg tablet twice daily and Seroquel 25 mg tablet twice daily.continue to assist with ADL's and re-direct as needed.    6. Skin tear  Right calf muscle and left upper lateral leg skin tears wound bed without any signs of infection.surrounding skin tissues intact.   Family/ staff Communication: Reviewed plan of care with patient and facility Nurse.   Labs/tests ordered: None   [place] Basham C Gerrica Cygan, NP

## 2017-08-28 ENCOUNTER — Non-Acute Institutional Stay (SKILLED_NURSING_FACILITY): Payer: Medicare Other | Admitting: Family

## 2017-08-28 ENCOUNTER — Encounter: Payer: Self-pay | Admitting: Family

## 2017-08-28 DIAGNOSIS — F0281 Dementia in other diseases classified elsewhere with behavioral disturbance: Secondary | ICD-10-CM | POA: Diagnosis not present

## 2017-08-28 DIAGNOSIS — G309 Alzheimer's disease, unspecified: Secondary | ICD-10-CM

## 2017-08-28 DIAGNOSIS — Z7189 Other specified counseling: Secondary | ICD-10-CM

## 2017-08-28 NOTE — ACP (Advance Care Planning) (Signed)
Patient's DNR,Living will and HCPOA reviewed in the chart with presence of patient's HCPOA tabytha gradillas ( son) and facility Social worker.MOST form reviewed and discussed with patient's son.He would like to continue with DNR in case of a cardiopulmonary event.He would also like additional medical intervention if patient has a pulse and /or is breathing.POA request for patient to be provided with comfort measures and do not transfer to the hospital Unless comfort needs cannot be met in current facility.POA would also like antibiotics and IV fluids to be administered if indicated but no feeding tube desired.I've answered patient's HCPOA questions to the best of my knowledge.Patient son reviewed and MOST form signed today by patient's son,facility Education officer, museum and Provider.ACP meeting held from 9:50 -10:15 Am.

## 2017-08-28 NOTE — Progress Notes (Addendum)
Location:  [place] Room Number: 32 Place of Service:  SNF (31) Provider: Zaccary Creech FNP-C  Blanchie Serve, MD  Patient Care Team: Blanchie Serve, MD as PCP - General (Internal Medicine) Jettie Booze, MD as Consulting Physician (Cardiology) Marchia Bond, MD as Consulting Physician (Orthopedic Surgery) Kathrynn Ducking, MD as Consulting Physician (Neurology) Brand Males, MD as Consulting Physician (Pulmonary Disease) Lolitha Tortora, Nelda Bucks, NP as Nurse Practitioner (Family Medicine)  Extended Emergency Contact Information Primary Emergency Contact: [place] Phone: [phone] Relation: Son Secondary Emergency Contact: Ricardo [place] of [place] Phone: [phone] Mobile Phone: [phone] Relation: Daughter  Code Status:  DNR Goals of care: Advanced Directive information Advanced Directives 08/28/2017  Does Patient Have a Medical Advance Directive? Yes  Type of Paramedic of [place];Out of facility DNR (pink MOST or yellow form);Living will  Does patient want to make changes to medical advance directive? -  Copy of [place] in Chart? Yes  Pre-existing out of facility DNR order (yellow form or pink MOST form) Yellow form placed in chart (order not valid for inpatient use)     Chief Complaint  Patient presents with  . Goals of Care    HPI:  Pt is a 84 y.o. female seen today at [facility] for an acute visit for goals of care discussion.she is seen in her room today.she continues to say "help" even when she does not need anything.Patient's son Rubby Barbary who is HCPOA states patient started yelling "Help,Help" several years ago when she was hospitalized. she was still living in her apartment.POA states that talking to patient and distracting has been effective.Facility Nurse reports no acute issues. DNR order,HCPOA ,living will  reviewed with patient's son today.MOST form also reviewed and discussed with son in the presence of the facility Social worker.HCPOA appreciative of the care being provided to the Mom.     Past Medical History:  Diagnosis Date  . Alzheimer's disease 06/22/2015  . Anemia    iron deficient  . Anemia   . Anxiety   . Chronic airway obstruction, not elsewhere classified   . Chronic back pain   . Chronic cystitis   . Cystitis, chronic    Dr Gaynelle Arabian  . Degenerative arthritis   . Depression   . Disturbance of skin sensation   . Disturbance of skin sensation   . Dyslipidemia   . Dyspnea    Dr Chase Caller  . Esophageal reflux   . Essential and other specified forms of tremor 12/11/2012  . Foot drop, bilateral 12/21/2013  . Gait disorder   . GERD (gastroesophageal reflux disease)   . Hemorrhoids   . Hereditary and idiopathic peripheral neuropathy 10/11/2015  . Hernia   . History of cerebrovascular disease    Moderate level small vessel disease  . Hyperlipidemia    mild  . Hypertension   . Osteoarthritis    right knee injection per Dr Trudie Reed  . Osteoporosis   . Other abnormal clinical finding   . Pain in joint, upper arm   . Peripheral vascular disease ([place])   . Rheumatoid arthritis ([place])    Dr Trudie Reed  . Tobacco use disorder   . Tremor    and gait disorder--Dr Jannifer Franklin  . Unspecified vitamin D deficiency    Past Surgical History:  Procedure Laterality Date  . APPENDECTOMY    . BACK SURGERY Bilateral    X2  . Bladder resuspension  procedure    . CATARACT EXTRACTION Bilateral   . FEMUR IM NAIL Right 02/22/2014   Procedure: INTRAMEDULLARY (IM) NAIL FEMORAL;  Surgeon: Johnny Bridge, MD;  Location: [place];  Service: Orthopedics;  Laterality: Right;  . INGUINAL HERNIA REPAIR Bilateral   . ORIF ANKLE FRACTURE Left 09/19/2016   Procedure: OPEN REDUCTION INTERNAL FIXATION (ORIF) ANKLE FRACTURE;  Surgeon: Vickey Huger, MD;  Location: [place];  Service: Orthopedics;  Laterality: Left;  .  TONSILLECTOMY      Allergies  Allergen Reactions  . Novocain [Procaine]     Passed  out  . Limbrel [Flavocoxid]     Dizziness   . Sertraline Anxiety    Outpatient Encounter Medications as of 08/28/2017  Medication Sig  . acetaminophen (TYLENOL) 650 MG CR tablet Take 650 mg by mouth 2 (two) times daily.   Marland Kitchen alum & mag hydroxide-simeth (MAALOX/MYLANTA) 200-200-20 MG/5ML suspension Take 30 mLs by mouth every 4 (four) hours as needed for indigestion or heartburn.  . Amino Acids-Protein Hydrolys (FEEDING SUPPLEMENT, PRO-STAT SUGAR FREE 64,) LIQD Take 30 mLs by mouth 2 (two) times daily.   . Calcium Carbonate-Vitamin D (CALTRATE 600+D) 600-400 MG-UNIT per tablet Take 1 tablet by mouth daily.  Marland Kitchen donepezil (ARICEPT) 10 MG tablet Take 10 mg by mouth at bedtime.  . furosemide (LASIX) 20 MG tablet Take 20 mg by mouth daily.  Marland Kitchen Hyoscyamine Sulfate SL (LEVSIN/SL) 0.125 MG SUBL Place under the tongue every 12 (twelve) hours as needed (for cramps).   . LORazepam (ATIVAN) 0.5 MG tablet Take 0.5 mg by mouth 2 (two) times daily.   . magnesium hydroxide (MILK OF MAGNESIA) 400 MG/5ML suspension Take 30 mLs by mouth daily as needed for mild constipation.  . memantine (NAMENDA) 10 MG tablet Take 10 mg by mouth 2 (two) times daily.  . metoprolol tartrate (LOPRESSOR) 25 MG tablet Take 12.5 mg by mouth 2 (two) times daily. Hold for SBP <110 or HR <60/min  . mirtazapine (REMERON) 15 MG tablet Take 15 mg by mouth at bedtime.  . Misc Natural Products (OSTEO BI-FLEX TRIPLE STRENGTH) TABS Take 1 tablet by mouth daily.  . Multiple Vitamin (MULTIVITAMIN) tablet Take 1 tablet by mouth daily.  . Nutritional Supplements (BENECALORIE PO) Take 1 oz by mouth daily.  . Nutritional Supplements (RESOURCE 2.0) LIQD Take 120 mLs by mouth 3 (three) times daily.  Marland Kitchen omeprazole (PRILOSEC) 20 MG capsule Take 20 mg by mouth daily.  . ondansetron (ZOFRAN) 4 MG tablet Take 4 mg by mouth every 6 (six) hours as needed for nausea or  vomiting.  Marland Kitchen QUEtiapine (SEROQUEL) 25 MG tablet Take 25 mg by mouth 2 (two) times daily. At 9AM and 1PM  . ranitidine (ZANTAC) 150 MG tablet Take 150 mg by mouth at bedtime.   . sennosides-docusate sodium (SENOKOT-S) 8.6-50 MG tablet Take 2 tablets by mouth daily.   No facility-administered encounter medications on file as of 08/28/2017.     Review of Systems  Unable to perform ROS: Dementia (additional information provided by facility Nurse and HCPOA)  Constitutional: Negative for appetite change, chills, fatigue and fever.  HENT: Negative for congestion, rhinorrhea, sinus pressure, sinus pain, sneezing and sore throat.   Eyes: Negative for discharge, redness and itching.       Wears eye glasses   Respiratory: Negative for cough, chest tightness, shortness of breath and wheezing.   Gastrointestinal: Negative for abdominal distention, abdominal pain, constipation, diarrhea and vomiting.       Chronic nausea  Endocrine: Negative for cold intolerance, heat intolerance, polydipsia, polyphagia and polyuria.  Genitourinary: Negative for dysuria, flank pain, frequency and urgency.  Musculoskeletal: Positive for gait problem.  Skin: Negative for color change, pallor and rash.       Skin tears to both lower extremities.   Neurological: Negative for dizziness, light-headedness and headaches.  Psychiatric/Behavioral: Positive for confusion. Negative for agitation and sleep disturbance. The patient is not nervous/anxious.     Immunization History  Administered Date(s) Administered  . Influenza Whole 01/16/2010  . Influenza-Unspecified 02/03/2006, 01/09/2007, 01/05/2013, 12/22/2014, 12/28/2015, 12/26/2016  . PPD Test 05/01/2004, 02/24/2014  . Pneumococcal Polysaccharide-23 01/21/2006  . Pneumococcal-Unspecified 12/19/1998  . Td 03/18/1998  . Tdap 09/18/2016   Pertinent  Health Maintenance Due  Topic Date Due  . PNA vac Low Risk Adult (2 of 2 - PCV13) 01/22/2007  . INFLUENZA VACCINE   10/16/2017  . DEXA SCAN  Completed   Fall Risk  10/28/2016 06/20/2015  Falls in the past year? No No  Risk for fall due to : - Impaired balance/gait    Vitals:   08/28/17 0955  BP: (!) 146/78  Pulse: 84  Resp: 15  Temp: 98.7 F (37.1 C)  SpO2: (!) 89%  Weight: 128 lb 8 oz (58.3 kg)  Height: _0  (1.549 m)   Body mass index is 24.28 kg/m. Physical Exam  Constitutional:  Elderly in no acute distress   HENT:  Head: Normocephalic.  Mouth/Throat: Oropharynx is clear and moist. No oropharyngeal exudate.  Eyes: Pupils are equal, round, and reactive to light. Conjunctivae and EOM are normal. Right eye exhibits no discharge. Left eye exhibits no discharge. No scleral icterus.  Neck: Normal range of motion. No JVD present. No thyromegaly present.  Cardiovascular: Normal rate, regular rhythm, normal heart sounds and intact distal pulses. Exam reveals no gallop and no friction rub.  No murmur heard. Pulmonary/Chest: Effort normal and breath sounds normal. No respiratory distress. She has no wheezes. She has no rales.  Abdominal: Soft. Bowel sounds are normal. She exhibits no distension and no mass. There is no tenderness. There is no rebound and no guarding.  Musculoskeletal: She exhibits no tenderness.  Unsteady gait uses wheelchair.bilateral lower extremities 1-2+ edema.knee high ted hose in place.    Lymphadenopathy:    She has no cervical adenopathy.  Neurological: She is alert. Gait abnormal.  Confused at her baseline.  Psychiatric: She has a normal mood and affect. Her speech is normal and behavior is normal. Judgment normal. Cognition and memory are impaired.  Nursing note and vitals reviewed.   Labs reviewed: Recent Labs    09/18/16 2232 09/18/16 2239  11/04/16 12/12/16 05/15/17  NA 137 136   < > 142 139  139 140  140  K 4.6 4.5   < > 3.6 4.0  4.0 4.2  4.2  CL 99* 96*  --   --  103 104  CO2 33*  --   --   --  28 30  GLUCOSE 115* 110*  --   --   --   --   BUN 24* 30*    < > 14 24*  24* 25*  CREATININE 1.06* 1.00   < > 0.9 0.9  0.93 0.8  0.82  CALCIUM 9.4  --   --   --  9.4 9.3  9.3  MG  --   --   --   --   --  2.1   < > = values in this  interval not displayed.   Recent Labs    12/12/16 05/15/17  AST _0 ALT _1 ALKPHOS 79  79 70  70  BILITOT 0.5 0.3  PROT 6.6 6.5  6.5  ALBUMIN 3.2 3.2  3.2   Recent Labs    09/18/16 2232  09/26/16 12/12/16 05/15/17  WBC 11.9*   < > 10.5 9.8  9.8 9.6  9.6  NEUTROABS 8.4*  --   --   --   --   HGB 13.0   < > 13.2 13.6  13.6 13.0  13.0  HCT 39.5   < > 40 42  42 39  38.8  MCV 99.7  --   --   --   --   PLT 358  --  447* 350 332   < > = values in this interval not displayed.   Lab Results  Component Value Date   TSH 1.09 05/15/2017   TSH 1.09 05/15/2017   Lab Results  Component Value Date   HGBA1C 5.8 12/16/2016   Lab Results  Component Value Date   CHOL 174 12/16/2016   CHOL 174 12/16/2016   HDL 68 12/16/2016   HDL 68 12/16/2016   LDLCALC 80 12/16/2016   LDLCALC 80 12/16/2016   TRIG 159 12/16/2016    Significant Diagnostic Results in last 30 days:  No results found.  Assessment/Plan 1. Goals of care, counseling/discussion Last ACP Note 05/30/2017 to 08/28/2017       ACP (Advance Care Planning) by Sandrea Hughs, NP at 08/28/2017  9:54 AM    Date of Service   Author Author Type Status Note Type File Time  08/28/2017 Addend Camrin Gearheart, Nelda Bucks, NP Nurse Practitioner Signed ACP (Advance Care Planning) 08/28/2017             Patient's DNR,Living will and HCPOA reviewed in the chart with presence of patient's HCPOA markea ruzich ( son) and facility Social worker.MOST form reviewed and discussed with patient's son.He would like to continue with DNR in case of a cardiopulmonary event.He would also like additional medical intervention if patient has a pulse and /or is breathing.POA request for patient to be provided with comfort measures and do not transfer to the hospital  Unless comfort needs cannot be met in current facility.POA would also like antibiotics and IV fluids to be administered if indicated but no feeding tube desired.I've answered patient's HCPOA questions to the best of my knowledge.Patient son reviewed and MOST form signed today by patient's son,facility Education officer, museum and Provider.ACP meeting held from 9:50 -10:15 Am.                 2. Alzheimer's dementia with behavioral disturbance Continues to call out for "Help" even when she does not need anything.HCPOA states calling for help has been ongoing for several years.continue to distract for now.continue on Seroquel 25 mg tablet twice daily,Donepezil 10 mg tablet daily Namenda 10 mg tablet twice daily,Remeron 15 mg tablet daily and Lorazepam 0.5 mg tablet twice daily.continue supportive care.   Family/ staff Communication: Reviewed plan of care with patient,Patient's [place] Social worker and Conservation officer, historic buildings.   Labs/tests ordered: None   Dredyn Gubbels C Jaysa Kise, NP

## 2017-09-02 ENCOUNTER — Encounter: Payer: Self-pay | Admitting: *Deleted

## 2017-09-02 ENCOUNTER — Non-Acute Institutional Stay (SKILLED_NURSING_FACILITY): Payer: Medicare Other | Admitting: Family

## 2017-09-02 ENCOUNTER — Encounter: Payer: Self-pay | Admitting: Family

## 2017-09-02 DIAGNOSIS — R05 Cough: Secondary | ICD-10-CM

## 2017-09-02 DIAGNOSIS — K219 Gastro-esophageal reflux disease without esophagitis: Secondary | ICD-10-CM | POA: Diagnosis not present

## 2017-09-02 DIAGNOSIS — R609 Edema, unspecified: Secondary | ICD-10-CM | POA: Diagnosis not present

## 2017-09-02 DIAGNOSIS — S81812D Laceration without foreign body, left lower leg, subsequent encounter: Secondary | ICD-10-CM

## 2017-09-02 DIAGNOSIS — F329 Major depressive disorder, single episode, unspecified: Secondary | ICD-10-CM | POA: Diagnosis not present

## 2017-09-02 DIAGNOSIS — R059 Cough, unspecified: Secondary | ICD-10-CM

## 2017-09-02 DIAGNOSIS — I10 Essential (primary) hypertension: Secondary | ICD-10-CM | POA: Diagnosis not present

## 2017-09-02 DIAGNOSIS — S81811D Laceration without foreign body, right lower leg, subsequent encounter: Secondary | ICD-10-CM | POA: Diagnosis not present

## 2017-09-02 DIAGNOSIS — I2789 Other specified pulmonary heart diseases: Secondary | ICD-10-CM | POA: Diagnosis not present

## 2017-09-02 DIAGNOSIS — R5383 Other fatigue: Secondary | ICD-10-CM | POA: Diagnosis not present

## 2017-09-02 LAB — CBC
HCT: 42.4
HEMOGLOBIN: 14.1
MCV: 92.6
PLATELET COUNT: 258
WBC: 7.4

## 2017-09-02 NOTE — Progress Notes (Signed)
Location:  [facility] Room Number: 32 Place of Service:  SNF (31) Provider: Kolbi Altadonna FNP-C   Blanchie Serve, MD  Patient Care Team: Blanchie Serve, MD as PCP - General (Internal Medicine) Jettie Booze, MD as Consulting Physician (Cardiology) Marchia Bond, MD as Consulting Physician (Orthopedic Surgery) Kathrynn Ducking, MD as Consulting Physician (Neurology) Brand Males, MD as Consulting Physician (Pulmonary Disease) Muaad Boehning, Nelda Bucks, NP as Nurse Practitioner (Family Medicine)  Extended Emergency Contact Information Primary Emergency Contact: [place] Phone: [phone] Relation: Son Secondary Emergency Contact: Ricardo [place] Phone: [phone] Mobile Phone: [phone] Relation: Daughter  Code Status: DNR Goals of care: Advanced Directive information Advanced Directives 09/02/2017  Does Patient Have a Medical Advance Directive? Yes  Type of Paramedic of [place];Out of facility DNR (pink MOST or yellow form);Living will  Does patient want to make changes to medical advance directive? -  Copy of [place] in Chart? Yes  Pre-existing out of facility DNR order (yellow form or pink MOST form) Yellow form placed in chart (order not valid for inpatient use)     Chief Complaint  Patient presents with  . Medical Management of Chronic Issues    HPI:  Pt is a 84 y.o. female seen today [facility] for medical management of chronic diseases.she is seen in her room today with facility Nurse present at bedside.she states not feeling well today but unable to describe exactly what's wrong.she denies fever,cough or chills though HPI limited due to her cognitive impairment. Facility Nurse reports no new concerns.No recent fall episode reported.Her weight log reviewed progressive weight gain noted with abrupt weight gain: Wt 118  lbs ( 06/17/2017); Wt 128.2 lbs ( 07/16/2017); Wt 128.5 lbs (08/16/2017).    Past Medical History:  Diagnosis Date  . Alzheimer's disease 06/22/2015  . Anemia    iron deficient  . Anemia   . Anxiety   . Chronic airway obstruction, not elsewhere classified   . Chronic back pain   . Chronic cystitis   . Cystitis, chronic    Dr Gaynelle Arabian  . Degenerative arthritis   . Depression   . Disturbance of skin sensation   . Disturbance of skin sensation   . Dyslipidemia   . Dyspnea    Dr Chase Caller  . Esophageal reflux   . Essential and other specified forms of tremor 12/11/2012  . Foot drop, bilateral 12/21/2013  . Gait disorder   . GERD (gastroesophageal reflux disease)   . Hemorrhoids   . Hereditary and idiopathic peripheral neuropathy 10/11/2015  . Hernia   . History of cerebrovascular disease    Moderate level small vessel disease  . Hyperlipidemia    mild  . Hypertension   . Osteoarthritis    right knee injection per Dr Trudie Reed  . Osteoporosis   . Other abnormal clinical finding   . Pain in joint, upper arm   . Peripheral vascular disease ([place])   . Rheumatoid arthritis ([place])    Dr Trudie Reed  . Tobacco use disorder   . Tremor    and gait disorder--Dr Jannifer Franklin  . Unspecified vitamin D deficiency    Past Surgical History:  Procedure Laterality Date  . APPENDECTOMY    . BACK SURGERY Bilateral    X2  . Bladder resuspension procedure    . CATARACT EXTRACTION Bilateral   . FEMUR IM NAIL Right 02/22/2014   Procedure: INTRAMEDULLARY (IM) NAIL  FEMORAL;  Surgeon: Johnny Bridge, MD;  Location: [facility];  Service: Orthopedics;  Laterality: Right;  . INGUINAL HERNIA REPAIR Bilateral   . ORIF ANKLE FRACTURE Left 09/19/2016   Procedure: OPEN REDUCTION INTERNAL FIXATION (ORIF) ANKLE FRACTURE;  Surgeon: Vickey Huger, MD;  Location: [facility];  Service: Orthopedics;  Laterality: Left;  . TONSILLECTOMY      Allergies  Allergen Reactions  . Novocain [Procaine]     Passed  out  . Limbrel [Flavocoxid]       Dizziness   . Sertraline Anxiety    Allergies as of 09/02/2017      Reactions   Novocain [procaine]    Passed  out   Limbrel [flavocoxid]    Dizziness   Sertraline Anxiety      Medication List        Accurate as of 09/02/17  1:03 PM. Always use your most recent med list.          acetaminophen 650 MG CR tablet Commonly known as:  TYLENOL Take 650 mg by mouth 2 (two) times daily.   alum & mag hydroxide-simeth 200-200-20 MG/5ML suspension Commonly known as:  MAALOX/MYLANTA Take 30 mLs by mouth every 4 (four) hours as needed for indigestion or heartburn.   BENECALORIE PO Take 1 oz by mouth daily.   RESOURCE 2.0 Liqd Take 120 mLs by mouth 3 (three) times daily.   CALTRATE 600+D 600-400 MG-UNIT tablet Generic drug:  Calcium Carbonate-Vitamin D Take 1 tablet by mouth daily.   donepezil 10 MG tablet Commonly known as:  ARICEPT Take 10 mg by mouth at bedtime.   feeding supplement (PRO-STAT SUGAR FREE 64) Liqd Take 30 mLs by mouth 2 (two) times daily.   furosemide 20 MG tablet Commonly known as:  LASIX Take 20 mg by mouth daily.   LEVSIN/SL 0.125 MG Subl Generic drug:  Hyoscyamine Sulfate SL Place under the tongue every 12 (twelve) hours as needed (for cramps).   LORazepam 0.5 MG tablet Commonly known as:  ATIVAN Take 0.5 mg by mouth 2 (two) times daily.   magnesium hydroxide 400 MG/5ML suspension Commonly known as:  MILK OF MAGNESIA Take 30 mLs by mouth daily as needed for mild constipation.   memantine 10 MG tablet Commonly known as:  NAMENDA Take 10 mg by mouth 2 (two) times daily.   metoprolol tartrate 25 MG tablet Commonly known as:  LOPRESSOR Take 12.5 mg by mouth 2 (two) times daily. Hold for SBP <110 or HR <60/min   mirtazapine 15 MG tablet Commonly known as:  REMERON Take 15 mg by mouth at bedtime.   multivitamin tablet Take 1 tablet by mouth daily.   omeprazole 20 MG capsule Commonly known as:  PRILOSEC Take 20 mg by mouth daily.    ondansetron 4 MG tablet Commonly known as:  ZOFRAN Take 4 mg by mouth every 6 (six) hours as needed for nausea or vomiting.   OSTEO BI-FLEX TRIPLE STRENGTH Tabs Take 1 tablet by mouth daily.   QUEtiapine 25 MG tablet Commonly known as:  SEROQUEL Take 25 mg by mouth 2 (two) times daily. At 9AM and 1PM   ranitidine 150 MG tablet Commonly known as:  ZANTAC Take 150 mg by mouth at bedtime.   sennosides-docusate sodium 8.6-50 MG tablet Commonly known as:  SENOKOT-S Take 2 tablets by mouth daily.       Review of Systems  Unable to perform ROS: Dementia (additional information provided by facility Nurse)  Constitutional: Positive for unexpected weight  change. Negative for chills, fatigue and fever.  HENT: Positive for hearing loss. Negative for congestion, rhinorrhea, sinus pressure, sinus pain, sneezing and sore throat.   Eyes: Positive for visual disturbance. Negative for discharge, redness and itching.       Wears eye glasses   Respiratory: Negative for cough, chest tightness, shortness of breath and wheezing.   Cardiovascular: Positive for leg swelling. Negative for chest pain and palpitations.  Gastrointestinal: Negative for abdominal distention, abdominal pain, constipation, diarrhea, nausea and vomiting.  Endocrine: Negative for cold intolerance, heat intolerance, polydipsia, polyphagia and polyuria.  Genitourinary: Negative for dysuria, flank pain, frequency and urgency.  Musculoskeletal: Positive for gait problem.  Skin: Negative for color change, pallor and rash.       Skin tears to both legs dressing managed by facility Nurse.   Neurological: Negative for dizziness, light-headedness and headaches.  Hematological: Does not bruise/bleed easily.  Psychiatric/Behavioral: Negative for agitation, confusion and sleep disturbance. The patient is not nervous/anxious.    Immunization History  Administered Date(s) Administered  . Influenza Whole 01/16/2010  .  Influenza-Unspecified 02/03/2006, 01/09/2007, 01/05/2013, 12/22/2014, 12/28/2015, 12/26/2016  . PPD Test 05/01/2004, 02/24/2014  . Pneumococcal Polysaccharide-23 01/21/2006  . Pneumococcal-Unspecified 12/19/1998  . Td 03/18/1998  . Tdap 09/18/2016   Pertinent  Health Maintenance Due  Topic Date Due  . PNA vac Low Risk Adult (2 of 2 - PCV13) 01/22/2007  . INFLUENZA VACCINE  10/16/2017  . DEXA SCAN  Completed   Fall Risk  10/28/2016 06/20/2015  Falls in the past year? No No  Risk for fall due to : - Impaired balance/gait    Vitals:   09/02/17 1115  BP: (!) 156/82  Pulse: 88  Resp: 15  Temp: 98.7 F (37.1 C)  SpO2: 93%  Weight: 128 lb 8 oz (58.3 kg)  Height: 5\' 1"  (1.549 m)   Body mass index is 24.28 kg/m. Physical Exam  Constitutional: She appears well-developed.  Elderly in no acute distress   HENT:  Head: Normocephalic.  Right Ear: External ear normal.  Left Ear: External ear normal.  Mouth/Throat: Oropharynx is clear and moist. No oropharyngeal exudate.  HOH  Eyes: Pupils are equal, round, and reactive to light. Conjunctivae and EOM are normal. Right eye exhibits no discharge. Left eye exhibits no discharge. No scleral icterus.  Neck: Normal range of motion. No JVD present. No thyromegaly present.  Cardiovascular: Normal rate, regular rhythm, normal heart sounds and intact distal pulses. Exam reveals no gallop and no friction rub.  No murmur heard. Pulmonary/Chest: Effort normal. No respiratory distress. She has wheezes. She has no rales. She exhibits no tenderness.  Bilateral lung expiration wheezes.Non productive cough noted.  Abdominal: Soft. Bowel sounds are normal. She exhibits no distension and no mass. There is no tenderness. There is no rebound and no guarding.  Musculoskeletal: She exhibits no tenderness.  Moves x 4 extremities.Unsteady gait self propels on wheelchair. Bilateral lower extremities 2+ edema.Knee high ted hose in place.   Lymphadenopathy:    She  has no cervical adenopathy.  Neurological: Gait abnormal.  Alert and oriented to person and self.  Skin: Skin is warm and dry. No rash noted. No erythema. No pallor.  1. Right leg skin tear x 2  wound bed red in color without any signs of infections.  2.left leg skin tear; progressive healing no signs of infections noted.    Psychiatric: She has a normal mood and affect. Her speech is normal. Cognition and memory are impaired.  Labs reviewed: Recent Labs    09/18/16 2232 09/18/16 2239  11/04/16 12/12/16 05/15/17  NA 137 136   < > 142 139  139 140  140  K 4.6 4.5   < > 3.6 4.0  4.0 4.2  4.2  CL 99* 96*  --   --  103 104  CO2 33*  --   --   --  28 30  GLUCOSE 115* 110*  --   --   --   --   BUN 24* 30*   < > 14 24*  24* 25*  CREATININE 1.06* 1.00   < > 0.9 0.9  0.93 0.8  0.82  CALCIUM 9.4  --   --   --  9.4 9.3  9.3  MG  --   --   --   --   --  2.1   < > = values in this interval not displayed.   Recent Labs    12/12/16 05/15/17  AST 17  17 16  16   ALT 8  8 8  8   ALKPHOS 79  79 70  70  BILITOT 0.5 0.3  PROT 6.6 6.5  6.5  ALBUMIN 3.2 3.2  3.2   Recent Labs    09/18/16 2232  09/26/16 12/12/16 05/15/17  WBC 11.9*   < > 10.5 9.8  9.8 9.6  9.6  NEUTROABS 8.4*  --   --   --   --   HGB 13.0   < > 13.2 13.6  13.6 13.0  13.0  HCT 39.5   < > 40 42  42 39  38.8  MCV 99.7  --   --   --   --   PLT 358  --  447* 350 332   < > = values in this interval not displayed.   Lab Results  Component Value Date   TSH 1.09 05/15/2017   TSH 1.09 05/15/2017   Lab Results  Component Value Date   HGBA1C 5.8 12/16/2016   Lab Results  Component Value Date   CHOL 174 12/16/2016   CHOL 174 12/16/2016   HDL 68 12/16/2016   HDL 68 12/16/2016   LDLCALC 80 12/16/2016   LDLCALC 80 12/16/2016   TRIG 159 12/16/2016    Significant Diagnostic Results in last 30 days:  No results found.  Assessment/Plan 1. Cough Afebrile.Non productive cough.bilateral lung expiration  wheezes noted.will obtain portable CXR 2 views rule out pneumonia.Stat CBC/diff rule out infectious causes.monitor vital signs Q shift x 5 days then resume facility protocol.Duoneb 0.5-2.5 (3)mg/3 ml inhale 3 mls via nebulizer every 6 hours x 5 days.continue to monitor.   2. Edema Bilateral lower extremities 2+ edema.Bilat.lung wheezes and non-productive cough noted.No shortness of breath.Weight gain 10.5 lbs over 2 months noted.Her Prostat d/ced by Registered Dietician. Continue to monitor weight.change Furosemide 20 mg tablet to one by mouth twice daily at 9 Am and 2 PM. Check BMP 09/08/2017.   3. Gastroesophageal reflux disease without esophagitis Stable.continue on Zantac 150 mg tablet daily and Omeprazole 20 mg capsule daily.continue to monitor.   4. Noninfected skin tear of right leg, subsequent encounter Afebrile.ST x 2 Progressive healing no signs of infections noted.   5. Skin tear of left lower leg without complication, subsequent encounter Afebrile.progressive healing no signs of infections noted.continue current dressing changes and monitor for signs of infections.   Family/ staff Communication: Reviewed plan of care with patient and facility Nurse.    Labs/tests ordered: portable CXR  2 views rule out pneumonia.Portable due to high risk for falls secondary to dementia.Stat CBC/diff.   Sandrea Hughs, NP

## 2017-09-03 ENCOUNTER — Encounter: Payer: Self-pay | Admitting: Internal Medicine

## 2017-09-03 ENCOUNTER — Non-Acute Institutional Stay (SKILLED_NURSING_FACILITY): Payer: Medicare Other | Admitting: Internal Medicine

## 2017-09-03 DIAGNOSIS — J189 Pneumonia, unspecified organism: Secondary | ICD-10-CM

## 2017-09-03 DIAGNOSIS — J181 Lobar pneumonia, unspecified organism: Secondary | ICD-10-CM

## 2017-09-03 DIAGNOSIS — R635 Abnormal weight gain: Secondary | ICD-10-CM

## 2017-09-03 DIAGNOSIS — R05 Cough: Secondary | ICD-10-CM | POA: Diagnosis not present

## 2017-09-03 DIAGNOSIS — R059 Cough, unspecified: Secondary | ICD-10-CM

## 2017-09-03 NOTE — Progress Notes (Signed)
Location:  [facility] Room Number: 32 Place of Service:  SNF [phone]) Provider:  Blanchie Serve, MD  Blanchie Serve, MD  Patient Care Team: Blanchie Serve, MD as PCP - General (Internal Medicine) Jettie Booze, MD as Consulting Physician (Cardiology) Marchia Bond, MD as Consulting Physician (Orthopedic Surgery) Kathrynn Ducking, MD as Consulting Physician (Neurology) Brand Males, MD as Consulting Physician (Pulmonary Disease) Ngetich, Nelda Bucks, NP as Nurse Practitioner (Family Medicine)  Extended Emergency Contact Information Primary Emergency Contact: [place] Phone: [phone] Relation: Son Secondary Emergency Contact: Ricardo Jericho [place] of [place] Phone: [phone] Mobile Phone: [phone] Relation: Daughter  Code Status:  DNR  Goals of care: Advanced Directive information Advanced Directives 09/03/2017  Does Patient Have a Medical Advance Directive? Yes  Type of Paramedic of [place];Living will;Out of facility DNR (pink MOST or yellow form)  Does patient want to make changes to medical advance directive? No - Patient declined  Copy of [place] in Chart? Yes  Pre-existing out of facility DNR order (yellow form or pink MOST form) Yellow form placed in chart (order not valid for inpatient use)     Chief Complaint  Patient presents with  . Acute Visit    cough and abnormal chest xray     HPI:  Pt is a 84 y.o. female seen today for an acute visit for cough and abnormal chest xray result. She was seen by my NP yesterday for routine visit and was noted to have cough with wheezing. Chest xray was ordered and stat lab ordered as well. Pt seen in her room today. Per nursing, pt is afebrile. She has been coughing mostly dry cough. She appears short of breath in between sentences. She mentions feeling cold and not feeling well but is unable to  describe it further. She has dementia and this limits her HPI and ROS.    Past Medical History:  Diagnosis Date  . Alzheimer's disease 06/22/2015  . Anemia    iron deficient  . Anemia   . Anxiety   . Chronic airway obstruction, not elsewhere classified   . Chronic back pain   . Chronic cystitis   . Cystitis, chronic    Dr Gaynelle Arabian  . Degenerative arthritis   . Depression   . Disturbance of skin sensation   . Disturbance of skin sensation   . Dyslipidemia   . Dyspnea    Dr Chase Caller  . Esophageal reflux   . Essential and other specified forms of tremor 12/11/2012  . Foot drop, bilateral 12/21/2013  . Gait disorder   . GERD (gastroesophageal reflux disease)   . Hemorrhoids   . Hereditary and idiopathic peripheral neuropathy 10/11/2015  . Hernia   . History of cerebrovascular disease    Moderate level small vessel disease  . Hyperlipidemia    mild  . Hypertension   . Osteoarthritis    right knee injection per Dr Trudie Reed  . Osteoporosis   . Other abnormal clinical finding   . Pain in joint, upper arm   . Peripheral vascular disease ([place])   . Rheumatoid arthritis ([place])    Dr Trudie Reed  . Tobacco use disorder   . Tremor    and gait disorder--Dr Jannifer Franklin  . Unspecified vitamin D deficiency    Past Surgical History:  Procedure Laterality Date  . APPENDECTOMY    . BACK SURGERY Bilateral    X2  . Bladder resuspension procedure    .  CATARACT EXTRACTION Bilateral   . FEMUR IM NAIL Right 02/22/2014   Procedure: INTRAMEDULLARY (IM) NAIL FEMORAL;  Surgeon: Johnny Bridge, MD;  Location: [facility];  Service: Orthopedics;  Laterality: Right;  . INGUINAL HERNIA REPAIR Bilateral   . ORIF ANKLE FRACTURE Left 09/19/2016   Procedure: OPEN REDUCTION INTERNAL FIXATION (ORIF) ANKLE FRACTURE;  Surgeon: Vickey Huger, MD;  Location: [facility];  Service: Orthopedics;  Laterality: Left;  . TONSILLECTOMY      Allergies  Allergen Reactions  . Novocain [Procaine]     Passed  out  . Limbrel  [Flavocoxid]     Dizziness   . Sertraline Anxiety    Outpatient Encounter Medications as of 09/03/2017  Medication Sig  . acetaminophen (TYLENOL) 650 MG CR tablet Take 650 mg by mouth 2 (two) times daily.   Marland Kitchen alum & mag hydroxide-simeth (MAALOX/MYLANTA) 200-200-20 MG/5ML suspension Take 30 mLs by mouth every 4 (four) hours as needed for indigestion or heartburn.  . Calcium Carbonate-Vitamin D (CALTRATE 600+D) 600-400 MG-UNIT per tablet Take 1 tablet by mouth daily.  Marland Kitchen donepezil (ARICEPT) 10 MG tablet Take 10 mg by mouth at bedtime.  . furosemide (LASIX) 20 MG tablet Take 20 mg by mouth 2 (two) times daily.   Marland Kitchen Hyoscyamine Sulfate SL (LEVSIN/SL) 0.125 MG SUBL Place under the tongue every 12 (twelve) hours as needed (for cramps).   Marland Kitchen ipratropium-albuterol (DUONEB) 0.5-2.5 (3) MG/3ML SOLN Take 3 mLs by nebulization every 6 (six) hours as needed.  Marland Kitchen LORazepam (ATIVAN) 0.5 MG tablet Take 0.5 mg by mouth 2 (two) times daily.   . memantine (NAMENDA) 10 MG tablet Take 10 mg by mouth 2 (two) times daily.  . metoprolol tartrate (LOPRESSOR) 25 MG tablet Take 12.5 mg by mouth 2 (two) times daily. Hold for SBP <110 or HR <60/min  . mirtazapine (REMERON) 15 MG tablet Take 15 mg by mouth at bedtime.  . Misc Natural Products (OSTEO BI-FLEX TRIPLE STRENGTH) TABS Take 1 tablet by mouth daily.  . Multiple Vitamin (MULTIVITAMIN) tablet Take 1 tablet by mouth daily.  . Nutritional Supplements (BENECALORIE PO) Take 1 oz by mouth daily.  . Nutritional Supplements (RESOURCE 2.0) LIQD Take 120 mLs by mouth 3 (three) times daily.  Marland Kitchen omeprazole (PRILOSEC) 20 MG capsule Take 20 mg by mouth daily.  . ondansetron (ZOFRAN) 4 MG tablet Take 4 mg by mouth every 6 (six) hours as needed for nausea or vomiting.  Marland Kitchen QUEtiapine (SEROQUEL) 25 MG tablet Take 25 mg by mouth 2 (two) times daily. At 9AM and 1PM  . ranitidine (ZANTAC) 150 MG tablet Take 150 mg by mouth at bedtime.   . sennosides-docusate sodium (SENOKOT-S) 8.6-50 MG  tablet Take 2 tablets by mouth daily.  . [DISCONTINUED] Amino Acids-Protein Hydrolys (FEEDING SUPPLEMENT, PRO-STAT SUGAR FREE 64,) LIQD Take 30 mLs by mouth 2 (two) times daily.   . [DISCONTINUED] magnesium hydroxide (MILK OF MAGNESIA) 400 MG/5ML suspension Take 30 mLs by mouth daily as needed for mild constipation.   No facility-administered encounter medications on file as of 09/03/2017.     Review of Systems  Unable to perform ROS: Dementia (limited)  Constitutional: Negative for fever.       Poor po intake per nursing but not a new change  HENT: Positive for rhinorrhea. Negative for ear pain.   Respiratory: Positive for cough. Negative for shortness of breath.   Cardiovascular: Negative for chest pain.  Gastrointestinal: Negative for abdominal pain, diarrhea and vomiting.  Musculoskeletal: Positive for gait problem.  No recent fall reported  Skin:       Skin tear reported  Neurological: Negative for dizziness and headaches.  Psychiatric/Behavioral: Positive for confusion and decreased concentration. Negative for hallucinations.    Immunization History  Administered Date(s) Administered  . Influenza Whole 01/16/2010  . Influenza-Unspecified 02/03/2006, 01/09/2007, 01/05/2013, 12/22/2014, 12/28/2015, 12/26/2016  . PPD Test 05/01/2004, 02/24/2014  . Pneumococcal Polysaccharide-23 01/21/2006  . Pneumococcal-Unspecified 12/19/1998  . Td 03/18/1998  . Tdap 09/18/2016   Pertinent  Health Maintenance Due  Topic Date Due  . PNA vac Low Risk Adult (2 of 2 - PCV13) 01/22/2007  . INFLUENZA VACCINE  10/16/2017  . DEXA SCAN  Completed   Fall Risk  10/28/2016 06/20/2015  Falls in the past year? No No  Risk for fall due to : - Impaired balance/gait   Functional Status Survey:    Vitals:   09/03/17 1235  BP: 118/70  Pulse: 94  Resp: 18  Temp: 98 F (36.7 C)  TempSrc: Oral  SpO2: 90%  Weight: 128 lb 8 oz (58.3 kg)  Height: '5\' 1"'$  (1.549 m)   Body mass index is 24.28 kg/m.    Wt Readings from Last 3 Encounters:  09/03/17 128 lb 8 oz (58.3 kg)  09/02/17 128 lb 8 oz (58.3 kg)  08/28/17 128 lb 8 oz (58.3 kg)   Weight 124 lb 08/07/17 and 118 lb on 07/14/17  Physical Exam  Constitutional: She appears well-developed. No distress.  Elderly frail female  HENT:  Head: Normocephalic and atraumatic.  Right Ear: External ear normal.  Left Ear: External ear normal.  Nose: Nose normal.  Mouth/Throat: Oropharynx is clear and moist. No oropharyngeal exudate.  Eyes: Pupils are equal, round, and reactive to light. Conjunctivae and EOM are normal. Right eye exhibits no discharge. Left eye exhibits no discharge.  Neck: Normal range of motion. Neck supple.  Cardiovascular: Normal rate and regular rhythm.  Pulmonary/Chest: Effort normal. She has no wheezes. She exhibits no tenderness.  Short of breath in between sentences, poor air movement to both lung bases  Left> right and has occasional rhonchi +, no use of accessory muscles  Abdominal: Soft. Bowel sounds are normal. There is no tenderness.  Musculoskeletal: She exhibits edema.  1+ leg edema, able to move all 4 extremities, generalized weakness, on wheelchair  Lymphadenopathy:    She has no cervical adenopathy.  Neurological: She is alert.  Oriented only to self  Skin: Skin is warm and dry. She is not diaphoretic.  Psychiatric:  Pleasantly confused    Labs reviewed: Recent Labs    09/18/16 2232 09/18/16 2239  11/04/16 12/12/16 05/15/17  NA 137 136   < > 142 139  139 140  140  K 4.6 4.5   < > 3.6 4.0  4.0 4.2  4.2  CL 99* 96*  --   --  103 104  CO2 33*  --   --   --  28 30  GLUCOSE 115* 110*  --   --   --   --   BUN 24* 30*   < > 14 24*  24* 25*  CREATININE 1.06* 1.00   < > 0.9 0.9  0.93 0.8  0.82  CALCIUM 9.4  --   --   --  9.4 9.3  9.3  MG  --   --   --   --   --  2.1   < > = values in this interval not displayed.   Recent Labs  12/12/16 05/15/17  AST '17  17 16  16  '$ ALT '8  8 8  8    '$ ALKPHOS 79  79 70  70  BILITOT 0.5 0.3  PROT 6.6 6.5  6.5  ALBUMIN 3.2 3.2  3.2   Recent Labs    09/18/16 2232  09/26/16  12/12/16 05/15/17 09/02/17  WBC 11.9*   < > 10.5  --  9.8  9.8 9.6  9.6 7.4  NEUTROABS 8.4*  --   --   --   --   --   --   HGB 13.0   < > 13.2   < > 13.6  13.6 13.0  13.0 14.1  HCT 39.5   < > 40  --  42  42 39  38.8 42.4  MCV 99.7  --   --   --   --   --  92.6  PLT 358  --  447*  --  350 332  --    < > = values in this interval not displayed.   Lab Results  Component Value Date   TSH 1.09 05/15/2017   TSH 1.09 05/15/2017   Lab Results  Component Value Date   HGBA1C 5.8 12/16/2016   Lab Results  Component Value Date   CHOL 174 12/16/2016   CHOL 174 12/16/2016   HDL 68 12/16/2016   HDL 68 12/16/2016   LDLCALC 80 12/16/2016   LDLCALC 80 12/16/2016   TRIG 159 12/16/2016    Significant Diagnostic Results in last 30 days:  No results found.   09/02/16 chest xray 2 view- mild pulmonary infiltrate in left lung base with small left pleural effusion  Assessment/Plan  1. Pneumonia of left lower lobe due to infectious organism [facility]) With cough, poor air movement, rales on lung exam and dyspnea in between sentences with rest along with chest xray finding of left lower lobe infiltrate, small pleural effusion and bedside pulse oximetry showing o2 sat 90%, will start her on antibiotic course of doxycycline 100 mg po q 12 h x 5 days . Afebrile at present. Monitor vital signs bid for now while on antibiotic. Also place her on florastor 250 mg bid x 1 week. Oxygen by nasal canula @ 2l/min prn to keep o2 sat >90%  2. Cough Start robitussin 10 ml bid x 1 week for cough. Continue duoneb and monitor  3. Weight gain finding Noted to have gained 10-11 lbs since 06/2017. Was on protein supplement that has been discontinued. Has chronic leg edema and has history of PVD. Small left sided pleural effusion noted on chest xray. No known history of CHF though can have  some diastolic dysfunction with her age. Concern for mild vascular congestion from pneumonia. Lasix was changed yesterday to 20 mg bid from once a day. Continue ted hose and furosemide current regimen. Add kcl supplement 10 meq daily given increase in lasix dosing.    Family/ staff Communication: reviewed care plan with patient and charge nurse.    Labs/tests ordered:  Cbc with diff, CMP with eGFR  Blanchie Serve, MD Internal Medicine [facility] [address] [place], [place] [address] Cell Phone (Monday-Friday 8 am - 5 pm): [phone] On Call: [phone] and follow prompts after 5 pm and on weekends Office Phone: [phone] Office Fax: [phone]

## 2017-09-08 DIAGNOSIS — M6281 Muscle weakness (generalized): Secondary | ICD-10-CM | POA: Diagnosis not present

## 2017-09-08 DIAGNOSIS — I10 Essential (primary) hypertension: Secondary | ICD-10-CM | POA: Diagnosis not present

## 2017-09-08 LAB — BASIC METABOLIC PANEL
BUN: 37 — AB (ref 4–21)
Creatinine: 1 (ref 0.5–1.1)
Glucose: 86
Potassium: 4.6 (ref 3.4–5.3)
Sodium: 142 (ref 137–147)

## 2017-09-08 LAB — CBC AND DIFFERENTIAL
HEMATOCRIT: 40 (ref 36–46)
HEMOGLOBIN: 13.2 (ref 12.0–16.0)
PLATELETS: 263 (ref 150–399)
WBC: 9.6

## 2017-09-08 LAB — HEPATIC FUNCTION PANEL
ALT: 10 (ref 7–35)
AST: 18 (ref 13–35)
Alkaline Phosphatase: 90 (ref 25–125)
BILIRUBIN, TOTAL: 0.4

## 2017-09-10 ENCOUNTER — Non-Acute Institutional Stay (SKILLED_NURSING_FACILITY): Payer: Medicare Other | Admitting: Internal Medicine

## 2017-09-10 ENCOUNTER — Encounter: Payer: Self-pay | Admitting: Internal Medicine

## 2017-09-10 DIAGNOSIS — J181 Lobar pneumonia, unspecified organism: Secondary | ICD-10-CM

## 2017-09-10 DIAGNOSIS — J189 Pneumonia, unspecified organism: Secondary | ICD-10-CM

## 2017-09-10 DIAGNOSIS — N179 Acute kidney failure, unspecified: Secondary | ICD-10-CM

## 2017-09-10 DIAGNOSIS — I739 Peripheral vascular disease, unspecified: Secondary | ICD-10-CM | POA: Diagnosis not present

## 2017-09-10 LAB — CMP 10231
Albumin: 3.3
CALCIUM: 9.3
Carbon Dioxide, Total: 31
Chloride: 104
EGFR (Non-African Amer.): 47
GLOBULIN: 3.2
Total Protein: 6.5

## 2017-09-10 NOTE — Progress Notes (Signed)
Location:  [facility] Room Number: 32 Place of Service:  SNF ([phone]) Provider:  Blanchie Serve, MD  Blanchie Serve, MD  Patient Care Team: Blanchie Serve, MD as PCP - General (Internal Medicine) Jettie Booze, MD as Consulting Physician (Cardiology) Marchia Bond, MD as Consulting Physician (Orthopedic Surgery) Kathrynn Ducking, MD as Consulting Physician (Neurology) Brand Males, MD as Consulting Physician (Pulmonary Disease) Ngetich, Nelda Bucks, NP as Nurse Practitioner (Family Medicine)  Extended Emergency Contact Information Primary Emergency Contact: [place] of [place] Phone: [phone] Relation: Son Secondary Emergency Contact: Ricardo Jericho [place] of [place] Phone: [phone] Mobile Phone: [phone] Relation: Daughter  Goals of care: Advanced Directive information Advanced Directives 09/10/2017  Does Patient Have a Medical Advance Directive? Yes  Type of Paramedic of [place];Living will;Out of facility DNR (pink MOST or yellow form)  Does patient want to make changes to medical advance directive? No - Patient declined  Copy of [place] in Chart? Yes  Pre-existing out of facility DNR order (yellow form or pink MOST form) Yellow form placed in chart (order not valid for inpatient use);Pink MOST form placed in chart (order not valid for inpatient use)     Chief Complaint  Patient presents with  . Acute Visit    Abnormal renal function     HPI:  Pt is a 84 y.o. female seen today for an acute visit for abnormal lab. She is seen in her room. Her lasix dosing was recently increased with increased swelling of her legs. Lab work shows worsening renal function. Her swelling remains the same. She is pleasantly confused with dementia and has limited participation in HPI and ROS. Per nursing. She weight about the same. No fever or chills reported. Appetite is  good. Ted stockings tolerated well. No acute behavior change reported.    Past Medical History:  Diagnosis Date  . Alzheimer's disease 06/22/2015  . Anemia    iron deficient  . Anemia   . Anxiety   . Chronic airway obstruction, not elsewhere classified   . Chronic back pain   . Chronic cystitis   . Cystitis, chronic    Dr Gaynelle Arabian  . Degenerative arthritis   . Depression   . Disturbance of skin sensation   . Disturbance of skin sensation   . Dyslipidemia   . Dyspnea    Dr Chase Caller  . Esophageal reflux   . Essential and other specified forms of tremor 12/11/2012  . Foot drop, bilateral 12/21/2013  . Gait disorder   . GERD (gastroesophageal reflux disease)   . Hemorrhoids   . Hereditary and idiopathic peripheral neuropathy 10/11/2015  . Hernia   . History of cerebrovascular disease    Moderate level small vessel disease  . Hyperlipidemia    mild  . Hypertension   . Osteoarthritis    right knee injection per Dr Trudie Reed  . Osteoporosis   . Other abnormal clinical finding   . Pain in joint, upper arm   . Peripheral vascular disease ([place])   . Rheumatoid arthritis ([place])    Dr Trudie Reed  . Tobacco use disorder   . Tremor    and gait disorder--Dr Jannifer Franklin  . Unspecified vitamin D deficiency    Past Surgical History:  Procedure Laterality Date  . APPENDECTOMY    . BACK SURGERY Bilateral    X2  . Bladder resuspension procedure    . CATARACT EXTRACTION Bilateral   . FEMUR IM  NAIL Right 02/22/2014   Procedure: INTRAMEDULLARY (IM) NAIL FEMORAL;  Surgeon: Johnny Bridge, MD;  Location: [facility];  Service: Orthopedics;  Laterality: Right;  . INGUINAL HERNIA REPAIR Bilateral   . ORIF ANKLE FRACTURE Left 09/19/2016   Procedure: OPEN REDUCTION INTERNAL FIXATION (ORIF) ANKLE FRACTURE;  Surgeon: Vickey Huger, MD;  Location: [facility];  Service: Orthopedics;  Laterality: Left;  . TONSILLECTOMY      Allergies  Allergen Reactions  . Novocain [Procaine]     Passed  out  . Limbrel  [Flavocoxid]     Dizziness   . Sertraline Anxiety    Outpatient Encounter Medications as of 09/10/2017  Medication Sig  . acetaminophen (TYLENOL) 650 MG CR tablet Take 650 mg by mouth 2 (two) times daily.   Marland Kitchen alum & mag hydroxide-simeth (MAALOX/MYLANTA) 200-200-20 MG/5ML suspension Take 30 mLs by mouth every 4 (four) hours as needed for indigestion or heartburn.  . Calcium Carbonate-Vitamin D (CALTRATE 600+D) 600-400 MG-UNIT per tablet Take 1 tablet by mouth daily.  Marland Kitchen donepezil (ARICEPT) 10 MG tablet Take 10 mg by mouth at bedtime.  . furosemide (LASIX) 20 MG tablet Take 20 mg by mouth 2 (two) times daily.   Marland Kitchen guaiFENesin-dextromethorphan (ROBITUSSIN DM) 100-10 MG/5ML syrup Take 10 mLs by mouth 2 (two) times daily. Stop date 09/13/17  . Hyoscyamine Sulfate SL (LEVSIN/SL) 0.125 MG SUBL Place under the tongue every 12 (twelve) hours as needed (for cramps).   . LORazepam (ATIVAN) 0.5 MG tablet Take 0.5 mg by mouth 2 (two) times daily.   . memantine (NAMENDA) 10 MG tablet Take 10 mg by mouth 2 (two) times daily.  . metoprolol tartrate (LOPRESSOR) 25 MG tablet Take 12.5 mg by mouth 2 (two) times daily. Hold for SBP <110 or HR <60/min  . mirtazapine (REMERON) 15 MG tablet Take 15 mg by mouth at bedtime.  . Misc Natural Products (OSTEO BI-FLEX TRIPLE STRENGTH) TABS Take 1 tablet by mouth daily.  . Multiple Vitamin (MULTIVITAMIN) tablet Take 1 tablet by mouth daily.  . Nutritional Supplements (BENECALORIE PO) Take 1 oz by mouth daily.  . Nutritional Supplements (RESOURCE 2.0) LIQD Take 120 mLs by mouth 3 (three) times daily.  Marland Kitchen omeprazole (PRILOSEC) 20 MG capsule Take 20 mg by mouth daily.  . ondansetron (ZOFRAN) 4 MG tablet Take 4 mg by mouth every 6 (six) hours as needed for nausea or vomiting.  . OXYGEN Inhale 2 L into the lungs as needed.  . potassium chloride (K-DUR,KLOR-CON) 10 MEQ tablet Take 10 mEq by mouth daily.  . QUEtiapine (SEROQUEL) 25 MG tablet Take 25 mg by mouth 2 (two) times daily.  At 9AM and 1PM  . ranitidine (ZANTAC) 150 MG tablet Take 150 mg by mouth at bedtime.   . saccharomyces boulardii (FLORASTOR) 250 MG capsule Take 250 mg by mouth 2 (two) times daily. Stop date 09/10/17  . sennosides-docusate sodium (SENOKOT-S) 8.6-50 MG tablet Take 2 tablets by mouth daily.  . [DISCONTINUED] ipratropium-albuterol (DUONEB) 0.5-2.5 (3) MG/3ML SOLN Take 3 mLs by nebulization every 6 (six) hours as needed.   No facility-administered encounter medications on file as of 09/10/2017.     Review of Systems  Unable to perform ROS: Dementia    Immunization History  Administered Date(s) Administered  . Influenza Whole 01/16/2010  . Influenza-Unspecified 02/03/2006, 01/09/2007, 01/05/2013, 12/22/2014, 12/28/2015, 12/26/2016  . PPD Test 05/01/2004, 02/24/2014  . Pneumococcal Polysaccharide-23 01/21/2006  . Pneumococcal-Unspecified 12/19/1998  . Td 03/18/1998  . Tdap 09/18/2016   Pertinent  Health  Maintenance Due  Topic Date Due  . PNA vac Low Risk Adult (2 of 2 - PCV13) 01/22/2007  . INFLUENZA VACCINE  10/16/2017  . DEXA SCAN  Completed   Fall Risk  10/28/2016 06/20/2015  Falls in the past year? No No  Risk for fall due to : - Impaired balance/gait   Functional Status Survey:    Vitals:   09/10/17 1520  BP: 138/86  Pulse: 91  Resp: 20  Temp: 97.9 F (36.6 C)  TempSrc: Oral  SpO2: 94%  Weight: 128 lb 8 oz (58.3 kg)  Height: 5\' 1"  (1.549 m)   Body mass index is 24.28 kg/m.   Wt Readings from Last 3 Encounters:  09/10/17 128 lb 8 oz (58.3 kg)  09/03/17 128 lb 8 oz (58.3 kg)  09/02/17 128 lb 8 oz (58.3 kg)   Physical Exam  Constitutional: She appears well-developed. No distress.  Frail elderly female  HENT:  Head: Normocephalic and atraumatic.  Nose: Nose normal.  Eyes: Conjunctivae are normal.  Neck: Neck supple.  Cardiovascular: Normal rate and regular rhythm.  Pulmonary/Chest: Effort normal. No respiratory distress.  Poor air movement, no wheezing, no  rales or rhonchi. On 2 liters oxygen by nasal canula  Abdominal: Soft. Bowel sounds are normal.  Musculoskeletal: She exhibits edema.  1+ pitting edema, ted stockings in place  Lymphadenopathy:    She has no cervical adenopathy.  Neurological: She is alert.  Oriented to self only  Skin: Skin is warm and dry. She is not diaphoretic.  Psychiatric:  Pleasantly confused    Labs reviewed: Recent Labs    09/18/16 2232 09/18/16 2239  12/12/16 05/15/17 09/08/17  NA 137 136   < > 139  139 140  140 142  K 4.6 4.5   < > 4.0  4.0 4.2  4.2 4.6  CL 99* 96*  --  103 104 104  CO2 33*  --   --  28 30 31   GLUCOSE 115* 110*  --   --   --   --   BUN 24* 30*   < > 24*  24* 25* 37*  CREATININE 1.06* 1.00   < > 0.9  0.93 0.8  0.82 1.0  CALCIUM 9.4  --   --  9.4 9.3  9.3 9.3  MG  --   --   --   --  2.1  --    < > = values in this interval not displayed.   Recent Labs    12/12/16 05/15/17 09/08/17  AST 17  17 16  16 18   ALT 8  8 8  8 10   ALKPHOS 79  79 70  70 90  BILITOT 0.5 0.3  --   PROT 6.6 6.5  6.5 6.5  ALBUMIN 3.2 3.2  3.2 3.3   Recent Labs    09/18/16 2232  12/12/16 05/15/17 09/02/17 09/08/17  WBC 11.9*   < > 9.8  9.8 9.6  9.6 7.4 9.6  NEUTROABS 8.4*  --   --   --   --   --   HGB 13.0   < > 13.6  13.6 13.0  13.0 14.1 13.2  HCT 39.5   < > 42  42 39  38.8 42.4 40  MCV 99.7  --   --   --  92.6  --   PLT 358   < > 350 332  --  263   < > = values in this interval not displayed.  Lab Results  Component Value Date   TSH 1.09 05/15/2017   TSH 1.09 05/15/2017   Lab Results  Component Value Date   HGBA1C 5.8 12/16/2016   Lab Results  Component Value Date   CHOL 174 12/16/2016   CHOL 174 12/16/2016   HDL 68 12/16/2016   HDL 68 12/16/2016   LDLCALC 80 12/16/2016   LDLCALC 80 12/16/2016   TRIG 159 12/16/2016    Significant Diagnostic Results in last 30 days:  No results found.  Assessment/Plan  1. PVD (peripheral vascular disease) (HCC) Ted stockings  for now. Change furosemide to torsemide with her impaired renal function. Check weight Tuesday and Friday for now. Reassess if needed.   2. Acute renal failure, unspecified acute renal failure type ([place]) Possibly from increased dosing of diuretic. She continue to have PVD with edema. D/c lasix. Start torsemide 20 mg daily. Monitor bmp. Continue kcl.   3. Pneumonia of left lower lobe due to infectious organism ([place]) Clinically improved, breathing stable, change o2 to only as needed for o2 sat <90%. Completed antibiotic. Supportive care. Aspiration precautions.    Family/ staff Communication: reviewed care plan with patient and charge nurse.    Labs/tests ordered:  BMP in 2 weeks  Blanchie Serve, MD Internal Medicine [facility] [place], [place] [address] Cell Phone (Monday-Friday 8 am - 5 pm): [phone] On Call: [phone] and follow prompts after 5 pm and on weekends Office Phone: [phone] Office Fax: [phone]

## 2017-09-24 ENCOUNTER — Non-Acute Institutional Stay (SKILLED_NURSING_FACILITY): Payer: Medicare Other | Admitting: Family

## 2017-09-24 ENCOUNTER — Encounter: Payer: Self-pay | Admitting: Family

## 2017-09-24 DIAGNOSIS — S51812A Laceration without foreign body of left forearm, initial encounter: Secondary | ICD-10-CM | POA: Diagnosis not present

## 2017-09-24 NOTE — Progress Notes (Signed)
Location:  [facility] Room Number: 32 Place of Service:  SNF (31) Provider: Ena Demary FNP-C  Blanchie Serve, MD  Patient Care Team: Blanchie Serve, MD as PCP - General (Internal Medicine) Jettie Booze, MD as Consulting Physician (Cardiology) Marchia Bond, MD as Consulting Physician (Orthopedic Surgery) Kathrynn Ducking, MD as Consulting Physician (Neurology) Brand Males, MD as Consulting Physician (Pulmonary Disease) Othniel Maret, Nelda Bucks, NP as Nurse Practitioner (Family Medicine)  Extended Emergency Contact Information Primary Emergency Contact: [place] Phone: [phone] Relation: Son Secondary Emergency Contact: Ricardo Jericho [place] of [place] Phone: [phone] Mobile Phone: [phone] Relation: Daughter  Code Status:  DNR Goals of care: Advanced Directive information Advanced Directives 09/24/2017  Does Patient Have a Medical Advance Directive? Yes  Type of Paramedic of [place];Out of facility DNR (pink MOST or yellow form);Living will  Does patient want to make changes to medical advance directive? -  Copy of [place] in Chart? Yes  Pre-existing out of facility DNR order (yellow form or pink MOST form) Yellow form placed in chart (order not valid for inpatient use);Pink MOST form placed in chart (order not valid for inpatient use)     Chief Complaint  Patient presents with  . Acute Visit    large skin tear to arm    HPI:  Pt is a 84 y.o. female seen today at [facility] for an acute visit for evaluation of left forearm skin tear sustained during transfer to wheelchair.Facility Nurse states skin tear cleansed and edges approximated, and steri-strips applied per standing orders.no fever or chills reported. Patient pleasantly confused unable to provide HPI and ROS information.     Past Medical History:  Diagnosis Date  .  Alzheimer's disease 06/22/2015  . Anemia    iron deficient  . Anemia   . Anxiety   . Chronic airway obstruction, not elsewhere classified   . Chronic back pain   . Chronic cystitis   . Cystitis, chronic    Dr Gaynelle Arabian  . Degenerative arthritis   . Depression   . Disturbance of skin sensation   . Disturbance of skin sensation   . Dyslipidemia   . Dyspnea    Dr Chase Caller  . Esophageal reflux   . Essential and other specified forms of tremor 12/11/2012  . Foot drop, bilateral 12/21/2013  . Gait disorder   . GERD (gastroesophageal reflux disease)   . Hemorrhoids   . Hereditary and idiopathic peripheral neuropathy 10/11/2015  . Hernia   . History of cerebrovascular disease    Moderate level small vessel disease  . Hyperlipidemia    mild  . Hypertension   . Osteoarthritis    right knee injection per Dr Trudie Reed  . Osteoporosis   . Other abnormal clinical finding   . Pain in joint, upper arm   . Peripheral vascular disease ([place])   . Rheumatoid arthritis ([place])    Dr Trudie Reed  . Tobacco use disorder   . Tremor    and gait disorder--Dr Jannifer Franklin  . Unspecified vitamin D deficiency    Past Surgical History:  Procedure Laterality Date  . APPENDECTOMY    . BACK SURGERY Bilateral    X2  . Bladder resuspension procedure    . CATARACT EXTRACTION Bilateral   . FEMUR IM NAIL Right 02/22/2014   Procedure: INTRAMEDULLARY (IM) NAIL FEMORAL;  Surgeon: Johnny Bridge, MD;  Location: [facility];  Service: Orthopedics;  Laterality: Right;  . INGUINAL HERNIA REPAIR Bilateral   . ORIF ANKLE FRACTURE Left 09/19/2016   Procedure: OPEN REDUCTION INTERNAL FIXATION (ORIF) ANKLE FRACTURE;  Surgeon: Vickey Huger, MD;  Location: [facility];  Service: Orthopedics;  Laterality: Left;  . TONSILLECTOMY      Allergies  Allergen Reactions  . Novocain [Procaine]     Passed  out  . Limbrel [Flavocoxid]     Dizziness   . Sertraline Anxiety    Outpatient Encounter Medications as of 09/24/2017  Medication Sig  .  acetaminophen (TYLENOL) 650 MG CR tablet Take 650 mg by mouth 2 (two) times daily.   Marland Kitchen alum & mag hydroxide-simeth (MAALOX/MYLANTA) 200-200-20 MG/5ML suspension Take 30 mLs by mouth every 4 (four) hours as needed for indigestion or heartburn.  . Calcium Carbonate-Vitamin D (CALTRATE 600+D) 600-400 MG-UNIT per tablet Take 1 tablet by mouth daily.  Marland Kitchen donepezil (ARICEPT) 10 MG tablet Take 10 mg by mouth at bedtime.  Marland Kitchen Hyoscyamine Sulfate SL (LEVSIN/SL) 0.125 MG SUBL Place under the tongue every 12 (twelve) hours as needed (for cramps).   . LORazepam (ATIVAN) 0.5 MG tablet Take 0.5 mg by mouth 2 (two) times daily.   . memantine (NAMENDA) 10 MG tablet Take 10 mg by mouth 2 (two) times daily.  . metoprolol tartrate (LOPRESSOR) 25 MG tablet Take 12.5 mg by mouth 2 (two) times daily. Hold for SBP <110 or HR <60/min  . mirtazapine (REMERON) 15 MG tablet Take 15 mg by mouth at bedtime.  . Misc Natural Products (OSTEO BI-FLEX TRIPLE STRENGTH) TABS Take 1 tablet by mouth daily.  . Multiple Vitamin (MULTIVITAMIN) tablet Take 1 tablet by mouth daily.  . Nutritional Supplements (RESOURCE 2.0) LIQD Take 120 mLs by mouth 3 (three) times daily.  Marland Kitchen omeprazole (PRILOSEC) 20 MG capsule Take 20 mg by mouth daily.  . ondansetron (ZOFRAN) 4 MG tablet Take 4 mg by mouth every 6 (six) hours as needed for nausea or vomiting.  . OXYGEN Inhale 2 L into the lungs as needed.  . potassium chloride (K-DUR,KLOR-CON) 10 MEQ tablet Take 10 mEq by mouth daily.  . QUEtiapine (SEROQUEL) 25 MG tablet Take 25 mg by mouth 2 (two) times daily. At 9AM and 1PM  . ranitidine (ZANTAC) 150 MG tablet Take 150 mg by mouth at bedtime.   . sennosides-docusate sodium (SENOKOT-S) 8.6-50 MG tablet Take 2 tablets by mouth daily.  Marland Kitchen torsemide (DEMADEX) 20 MG tablet Take 20 mg by mouth daily.  Marland Kitchen guaiFENesin-dextromethorphan (ROBITUSSIN DM) 100-10 MG/5ML syrup Take 10 mLs by mouth 2 (two) times daily. Stop date 09/13/17  . [DISCONTINUED] furosemide  (LASIX) 20 MG tablet Take 20 mg by mouth 2 (two) times daily.   . [DISCONTINUED] Nutritional Supplements (BENECALORIE PO) Take 1 oz by mouth daily.  . [DISCONTINUED] saccharomyces boulardii (FLORASTOR) 250 MG capsule Take 250 mg by mouth 2 (two) times daily. Stop date 09/10/17   No facility-administered encounter medications on file as of 09/24/2017.     Review of Systems  Unable to perform ROS: Dementia (additional information provided by facility Nurse)    Immunization History  Administered Date(s) Administered  . Influenza Whole 01/16/2010  . Influenza-Unspecified 02/03/2006, 01/09/2007, 01/05/2013, 12/22/2014, 12/28/2015, 12/26/2016  . PPD Test 05/01/2004, 02/24/2014  . Pneumococcal Polysaccharide-23 01/21/2006  . Pneumococcal-Unspecified 12/19/1998  . Td 03/18/1998  . Tdap 09/18/2016   Pertinent  Health Maintenance Due  Topic Date Due  . PNA vac Low Risk Adult (2 of 2 - PCV13) 01/22/2007  . INFLUENZA  VACCINE  10/16/2017  . DEXA SCAN  Completed   Fall Risk  10/28/2016 06/20/2015  Falls in the past year? No No  Risk for fall due to : - Impaired balance/gait   Functional Status Survey:    Vitals:   09/24/17 1457  BP: 138/76  Pulse: 94  Resp: 17  Temp: 98.7 F (37.1 C)  SpO2: 94%  Weight: 122 lb 11.2 oz (55.7 kg)  Height: 5\' 1"  (1.549 m)   Body mass index is 23.18 kg/m. Physical Exam  Constitutional: She appears well-developed and well-nourished.  Elderly in no acute distress   HENT:  Head: Normocephalic.  Right Ear: External ear normal.  Left Ear: External ear normal.  Mouth/Throat: Oropharynx is clear and moist. No oropharyngeal exudate.  Eyes: Pupils are equal, round, and reactive to light. Conjunctivae and EOM are normal. Right eye exhibits no discharge. Left eye exhibits no discharge. No scleral icterus.  Neck: Normal range of motion. No JVD present. No thyromegaly present.  Cardiovascular: Normal rate, regular rhythm, normal heart sounds and intact distal  pulses. Exam reveals no gallop and no friction rub.  No murmur heard. Pulmonary/Chest: Effort normal and breath sounds normal. No respiratory distress. She has no wheezes. She has no rales.  Abdominal: Soft. Bowel sounds are normal. She exhibits no distension and no mass. There is no tenderness. There is no rebound and no guarding.  Lymphadenopathy:    She has no cervical adenopathy.  Neurological: Gait abnormal.  Pleasantly confused at her baseline   Skin: Skin is warm and dry. No rash noted. No erythema. No pallor.  Left forearm skin tear edges well approximated with steri-strips intact.surrounding skin tissue without any signs of infections.  Psychiatric: She has a normal mood and affect. Her speech is normal. Cognition and memory are impaired.  Nursing note and vitals reviewed.   Labs reviewed: Recent Labs    12/12/16 05/15/17 09/08/17  NA 139  139 140  140 142  K 4.0  4.0 4.2  4.2 4.6  CL 103 104 104  CO2 28 30 31   BUN 24*  24* 25* 37*  CREATININE 0.9  0.93 0.8  0.82 1.0  CALCIUM 9.4 9.3  9.3 9.3  MG  --  2.1  --    Recent Labs    12/12/16 05/15/17 09/08/17  AST 17  17 16  16 18   ALT 8  8 8  8 10   ALKPHOS 79  79 70  70 90  BILITOT 0.5 0.3  --   PROT 6.6 6.5  6.5 6.5  ALBUMIN 3.2 3.2  3.2 3.3   Recent Labs    12/12/16 05/15/17 09/02/17 09/08/17  WBC 9.8  9.8 9.6  9.6 7.4 9.6  HGB 13.6  13.6 13.0  13.0 14.1 13.2  HCT 42  42 39  38.8 42.4 40  MCV  --   --  92.6  --   PLT 350 332  --  263   Lab Results  Component Value Date   TSH 1.09 05/15/2017   TSH 1.09 05/15/2017   Lab Results  Component Value Date   HGBA1C 5.8 12/16/2016   Lab Results  Component Value Date   CHOL 174 12/16/2016   CHOL 174 12/16/2016   HDL 68 12/16/2016   HDL 68 12/16/2016   LDLCALC 80 12/16/2016   LDLCALC 80 12/16/2016   TRIG 159 12/16/2016    Significant Diagnostic Results in last 30 days:  No results found.  Assessment/Plan   Skin  tear of left forearm  without complication, initial encounter Afebrile. Edges well approximated with steri-strips intact.surrounding skin tissue without any signs of infections.continue to change dressing every three days.monitor for signs of infections.Notify provider for any fever,chills or drainage from skin tear site.  Family/ staff Communication: Reviewed plan of care with patient and facility Nurse supervisor.   Labs/tests ordered: None   Agnes Brightbill C Koree Staheli, NP

## 2017-09-25 DIAGNOSIS — I10 Essential (primary) hypertension: Secondary | ICD-10-CM | POA: Diagnosis not present

## 2017-09-25 LAB — BASIC METABOLIC PANEL WITH GFR
BUN: 36 — AB (ref 4–21)
Calcium: 9.4
Creat: 1
Creatinine: 1 (ref 0.5–1.1)
EGFR (Non-African Amer.): 49
Glucose: 96
Glucose: 96
Potassium: 4.3
Potassium: 4.5 (ref 3.4–5.3)
Sodium: 143
Sodium: 143 (ref 137–147)

## 2017-09-26 ENCOUNTER — Encounter: Payer: Self-pay | Admitting: *Deleted

## 2017-09-28 IMAGING — CT CT CERVICAL SPINE W/O CM
4 of 8 series · 14 of 33 positions shown, 15 images · non-contrast
Comparison: Head CT 02/21/2014

CLINICAL DATA: Dementia patient post unwitnessed fall landing on
left side. Struck head on bed.

EXAM:
CT HEAD WITHOUT CONTRAST
CT CERVICAL SPINE WITHOUT CONTRAST
TECHNIQUE: Multidetector CT imaging of the head and cervical spine was
performed following the standard protocol without intravenous
contrast. Multiplanar CT image reconstructions of the cervical spine
were also generated.

[Series 5: coronal · coronal · 0.28mm/px · 3 of 64 slices shown]
[im 16/64  bone]
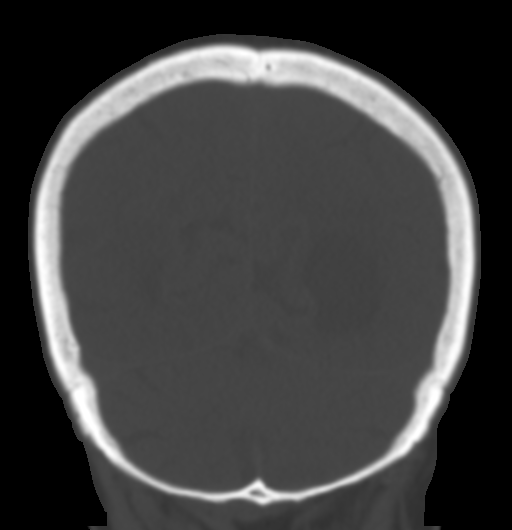
[im 32/64  bone]
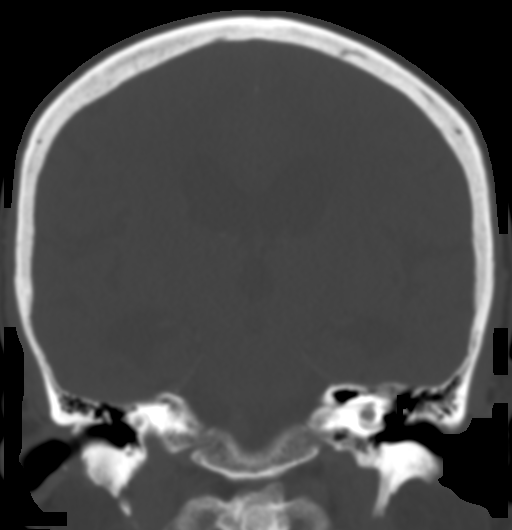
[im 48/64  bone]
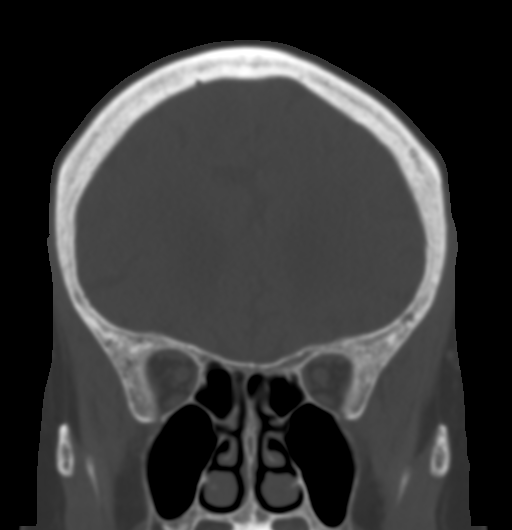

[Series 6: sagittal · sagittal · 0.29mm/px · 5 of 47 slices shown]
[im 8/47  bone]
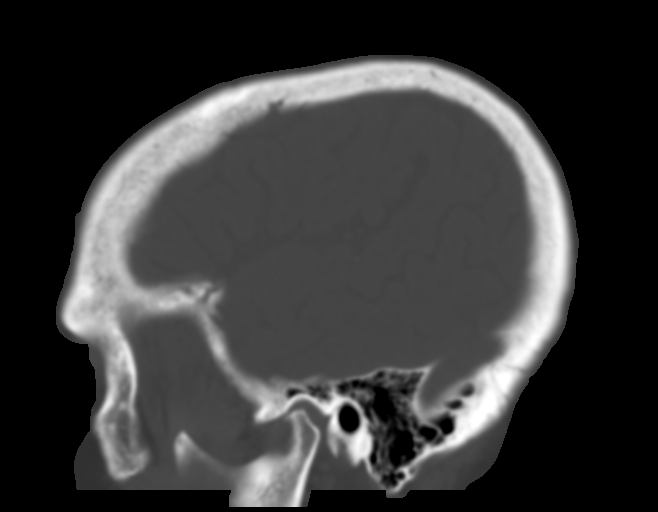
[im 16/47  bone]
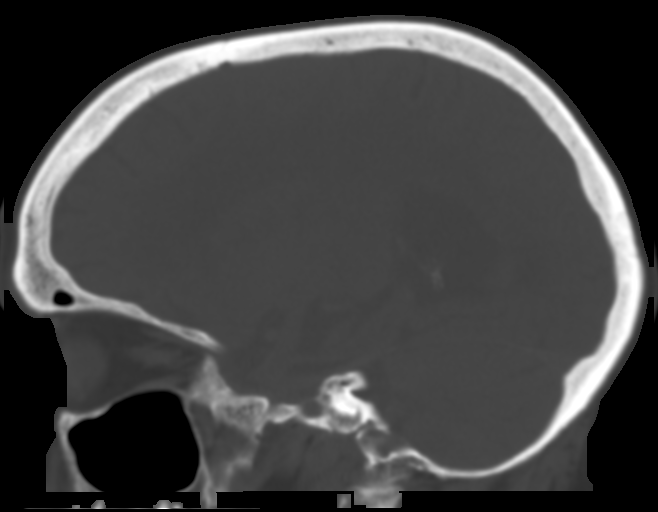
[im 24/47  bone]
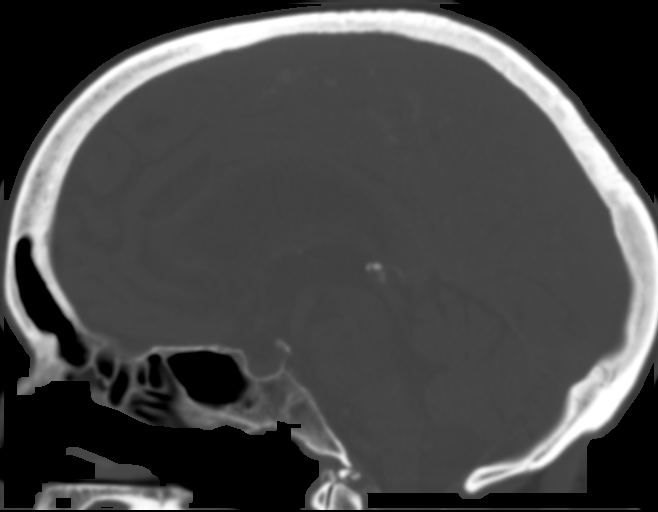
[im 31/47  bone]
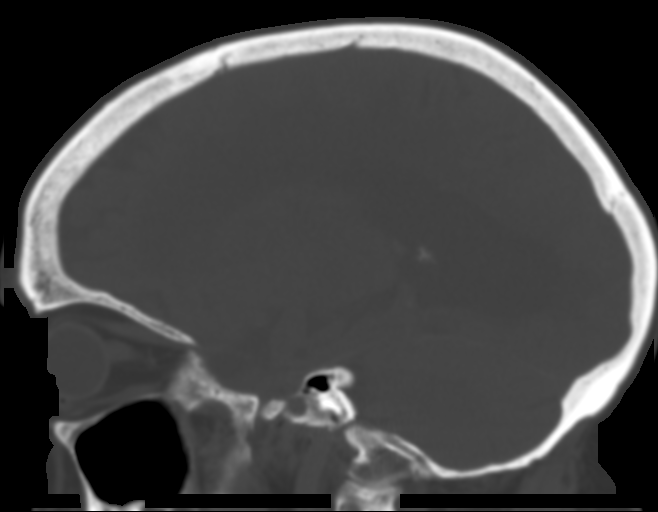
[im 39/47  bone]
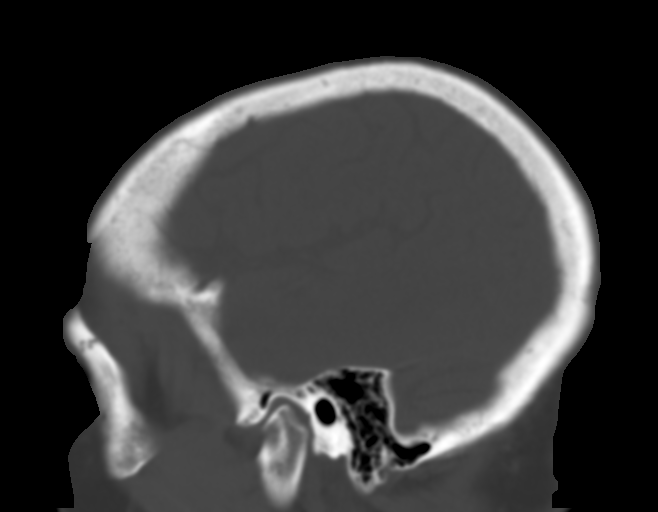

[Series 7: c-spine st · axial · 0.33mm/px · z∈[-234,-150]mm · 3 of 86 slices shown, 4 images]
[im 22/86  soft-tissue]
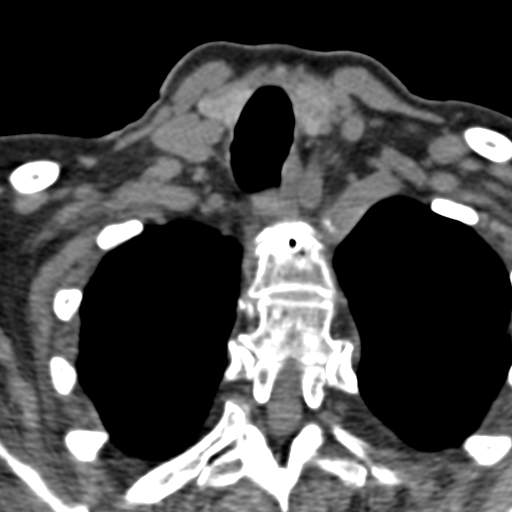
[im 22/86  bone]
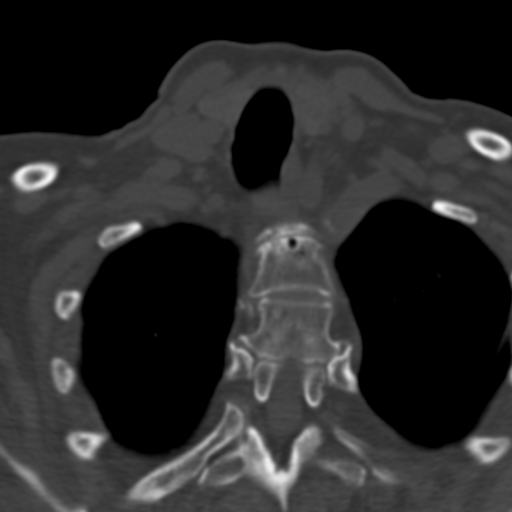
[im 43/86  bone]
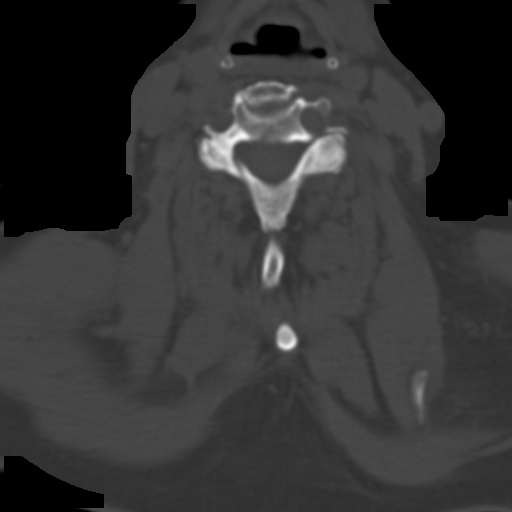
[im 64/86  bone]
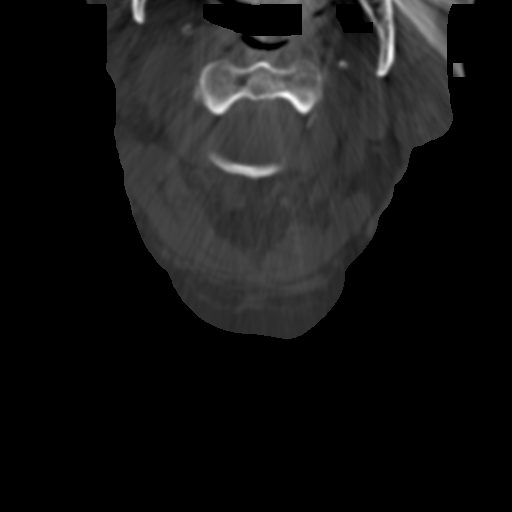

[Series 9: axial recon · axial · 0.19mm/px · z∈[-261,-187]mm · 3 of 93 slices shown]
[im 24/93  bone]
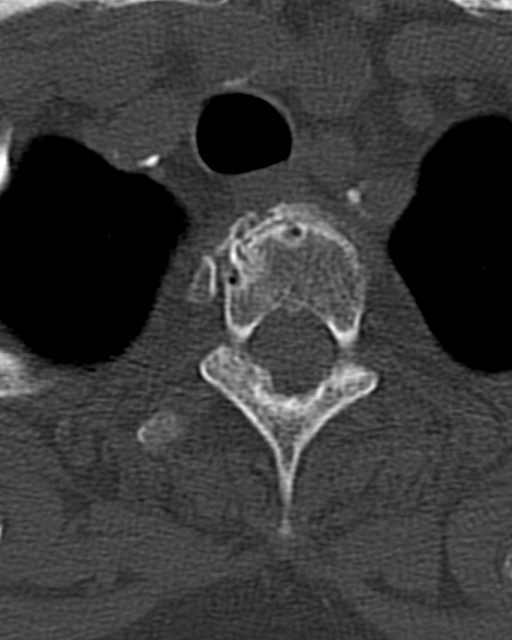
[im 47/93  bone]
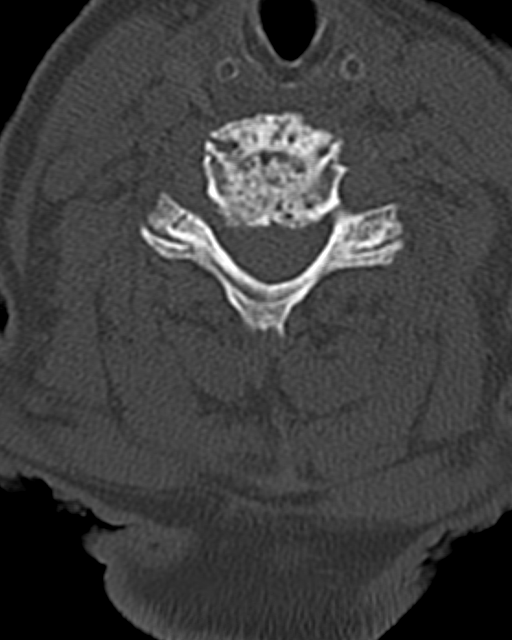
[im 70/93  bone]
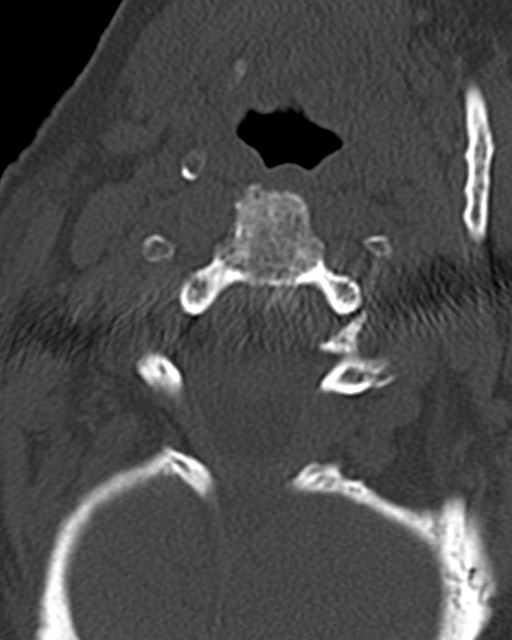

[14 of 33 positions shown; findings below may reference images not displayed]

FINDINGS: CT HEAD FINDINGS

Brain: No evidence of acute infarction, hemorrhage, hydrocephalus,
extra-axial collection or mass lesion/mass effect. Stable
ventriculomegaly and ventricular morphology. Stable advanced chronic
small vessel ischemia. Remote lacunar infarct in the right caudate.

Vascular: Atherosclerosis of skullbase vasculature without
hyperdense vessel or abnormal calcification.

Skull: No skull fracture.  No focal lesion.

Sinuses/Orbits: Paranasal sinuses and mastoid air cells are clear.
The visualized orbits are unremarkable. Bilateral cataract
resection.

Other: None.

CT CERVICAL SPINE FINDINGS

Alignment: No traumatic malalignment. Minimal anterolisthesis of C7
on T1, retrolisthesis of C5 on C6, and anterolisthesis of C3 on C4
appears degenerative. This at the normally aligned.

Skull base and vertebrae: No acute fracture. Skullbase and dens are
intact. Vertebral body marrow heterogeneity likely secondary to
osteopenia. No discrete focal lesion.

Soft tissues and spinal canal: No prevertebral fluid or swelling. No
visible canal hematoma.

Disc levels: Diffuse disc space narrowing and endplate spurring,
most prominent at C5-C6. Multilevel facet arthropathy.

Upper chest: No acute abnormality. Atherosclerosis of the visualized
thoracic aorta.

Other: Carotid calcifications.
IMPRESSION: 1. No acute intracranial abnormality. No skull fracture. Stable
chronic change.
2. Multilevel degenerative change in the cervical spine without
acute fracture or subluxation.

## 2017-10-01 ENCOUNTER — Non-Acute Institutional Stay (SKILLED_NURSING_FACILITY): Payer: Medicare Other | Admitting: Internal Medicine

## 2017-10-01 ENCOUNTER — Encounter: Payer: Self-pay | Admitting: Internal Medicine

## 2017-10-01 DIAGNOSIS — F0281 Dementia in other diseases classified elsewhere with behavioral disturbance: Secondary | ICD-10-CM

## 2017-10-01 DIAGNOSIS — G309 Alzheimer's disease, unspecified: Secondary | ICD-10-CM

## 2017-10-01 DIAGNOSIS — K219 Gastro-esophageal reflux disease without esophagitis: Secondary | ICD-10-CM | POA: Diagnosis not present

## 2017-10-01 DIAGNOSIS — I10 Essential (primary) hypertension: Secondary | ICD-10-CM | POA: Diagnosis not present

## 2017-10-01 DIAGNOSIS — I739 Peripheral vascular disease, unspecified: Secondary | ICD-10-CM | POA: Diagnosis not present

## 2017-10-01 NOTE — Progress Notes (Signed)
Location:  [facility] Room Number: 32 Place of Service:  SNF [phone]) Provider:  Blanchie Serve MD  Blanchie Serve, MD  Patient Care Team: Blanchie Serve, MD as PCP - General (Internal Medicine) Jettie Booze, MD as Consulting Physician (Cardiology) Marchia Bond, MD as Consulting Physician (Orthopedic Surgery) Kathrynn Ducking, MD as Consulting Physician (Neurology) Brand Males, MD as Consulting Physician (Pulmonary Disease) Ngetich, Nelda Bucks, NP as Nurse Practitioner (Family Medicine)  Extended Emergency Contact Information Primary Emergency Contact: [place] of [place] Phone: [phone] Relation: Son Secondary Emergency Contact: Ricardo Jericho [place] of [place] Phone: [phone] Mobile Phone: [phone] Relation: Daughter  Code Status:  DNR  Goals of care: Advanced Directive information Advanced Directives 10/01/2017  Does Patient Have a Medical Advance Directive? -  Type of Paramedic of [place];Living will;Out of facility DNR (pink MOST or yellow form)  Does patient want to make changes to medical advance directive? No - Patient declined  Copy of [place] in Chart? Yes  Pre-existing out of facility DNR order (yellow form or pink MOST form) Yellow form placed in chart (order not valid for inpatient use);Pink MOST form placed in chart (order not valid for inpatient use)     Chief Complaint  Patient presents with  . Medical Management of Chronic Issues    Routine Visit     HPI:  Pt is a 84 y.o. female seen today for medical management of chronic diseases. She is pleasantly confused, she denies any concern. She is getting ready for lunch.     Past Medical History:  Diagnosis Date  . Alzheimer's disease 06/22/2015  . Anemia    iron deficient  . Anemia   . Anxiety   . Chronic airway obstruction, not elsewhere classified   . Chronic back pain     . Chronic cystitis   . Cystitis, chronic    Dr Gaynelle Arabian  . Degenerative arthritis   . Depression   . Disturbance of skin sensation   . Disturbance of skin sensation   . Dyslipidemia   . Dyspnea    Dr Chase Caller  . Esophageal reflux   . Essential and other specified forms of tremor 12/11/2012  . Foot drop, bilateral 12/21/2013  . Gait disorder   . GERD (gastroesophageal reflux disease)   . Hemorrhoids   . Hereditary and idiopathic peripheral neuropathy 10/11/2015  . Hernia   . History of cerebrovascular disease    Moderate level small vessel disease  . Hyperlipidemia    mild  . Hypertension   . Osteoarthritis    right knee injection per Dr Trudie Reed  . Osteoporosis   . Other abnormal clinical finding   . Pain in joint, upper arm   . Peripheral vascular disease ([place])   . Rheumatoid arthritis ([place])    Dr Trudie Reed  . Tobacco use disorder   . Tremor    and gait disorder--Dr Jannifer Franklin  . Unspecified vitamin D deficiency    Past Surgical History:  Procedure Laterality Date  . APPENDECTOMY    . BACK SURGERY Bilateral    X2  . Bladder resuspension procedure    . CATARACT EXTRACTION Bilateral   . FEMUR IM NAIL Right 02/22/2014   Procedure: INTRAMEDULLARY (IM) NAIL FEMORAL;  Surgeon: Johnny Bridge, MD;  Location: [facility];  Service: Orthopedics;  Laterality: Right;  . INGUINAL HERNIA REPAIR Bilateral   . ORIF ANKLE FRACTURE Left 09/19/2016   Procedure: OPEN REDUCTION  INTERNAL FIXATION (ORIF) ANKLE FRACTURE;  Surgeon: Vickey Huger, MD;  Location: [facility];  Service: Orthopedics;  Laterality: Left;  . TONSILLECTOMY      Allergies  Allergen Reactions  . Novocain [Procaine]     Passed  out  . Limbrel [Flavocoxid]     Dizziness   . Sertraline Anxiety    Outpatient Encounter Medications as of 10/01/2017  Medication Sig  . acetaminophen (TYLENOL) 650 MG CR tablet Take 650 mg by mouth 2 (two) times daily.   Marland Kitchen alum & mag hydroxide-simeth (MAALOX/MYLANTA) 200-200-20 MG/5ML suspension Take  30 mLs by mouth every 4 (four) hours as needed for indigestion or heartburn.  . Calcium Carbonate-Vitamin D (CALTRATE 600+D) 600-400 MG-UNIT per tablet Take 1 tablet by mouth daily.  Marland Kitchen donepezil (ARICEPT) 10 MG tablet Take 10 mg by mouth at bedtime.  Marland Kitchen Hyoscyamine Sulfate SL (LEVSIN/SL) 0.125 MG SUBL Place under the tongue every 12 (twelve) hours as needed (for cramps).   . LORazepam (ATIVAN) 0.5 MG tablet Take 0.5 mg by mouth 2 (two) times daily.   . memantine (NAMENDA) 10 MG tablet Take 10 mg by mouth 2 (two) times daily.  . metoprolol tartrate (LOPRESSOR) 25 MG tablet Take 12.5 mg by mouth 2 (two) times daily. Hold for SBP <110 or HR <60/min  . mirtazapine (REMERON) 15 MG tablet Take 15 mg by mouth at bedtime.  . Misc Natural Products (OSTEO BI-FLEX TRIPLE STRENGTH) TABS Take 1 tablet by mouth daily.  . Multiple Vitamin (MULTIVITAMIN) tablet Take 1 tablet by mouth daily.  . Nutritional Supplements (RESOURCE 2.0) LIQD Take 120 mLs by mouth 3 (three) times daily.  Marland Kitchen omeprazole (PRILOSEC) 20 MG capsule Take 20 mg by mouth daily.  . ondansetron (ZOFRAN) 4 MG tablet Take 4 mg by mouth every 6 (six) hours as needed for nausea or vomiting.  . OXYGEN Inhale 2 L into the lungs as needed.  . potassium chloride (K-DUR,KLOR-CON) 10 MEQ tablet Take 10 mEq by mouth daily.  . QUEtiapine (SEROQUEL) 25 MG tablet Take 25 mg by mouth 2 (two) times daily. At 9AM and 1PM  . ranitidine (ZANTAC) 150 MG tablet Take 150 mg by mouth at bedtime.   . sennosides-docusate sodium (SENOKOT-S) 8.6-50 MG tablet Take 2 tablets by mouth daily.  Marland Kitchen torsemide (DEMADEX) 20 MG tablet Take 20 mg by mouth daily.  . [DISCONTINUED] guaiFENesin-dextromethorphan (ROBITUSSIN DM) 100-10 MG/5ML syrup Take 10 mLs by mouth 2 (two) times daily. Stop date 09/13/17   No facility-administered encounter medications on file as of 10/01/2017.     Review of Systems  Unable to perform ROS: Dementia (limited)  Constitutional: Negative for appetite  change, chills and fever.  HENT: Positive for hearing loss. Negative for congestion, mouth sores, sinus pressure and trouble swallowing.   Respiratory: Negative for cough and shortness of breath.   Cardiovascular: Negative for chest pain and palpitations.  Gastrointestinal: Negative for abdominal pain, diarrhea and vomiting.  Genitourinary: Negative for dysuria and flank pain.  Musculoskeletal: Positive for gait problem.  Skin: Negative for rash.  Neurological: Negative for dizziness and headaches.  Psychiatric/Behavioral: Positive for behavioral problems and confusion.    Immunization History  Administered Date(s) Administered  . Influenza Whole 01/16/2010  . Influenza-Unspecified 02/03/2006, 01/09/2007, 01/05/2013, 12/22/2014, 12/28/2015, 12/26/2016  . PPD Test 05/01/2004, 02/24/2014  . Pneumococcal Polysaccharide-23 01/21/2006  . Pneumococcal-Unspecified 12/19/1998  . Td 03/18/1998  . Tdap 09/18/2016   Pertinent  Health Maintenance Due  Topic Date Due  . PNA vac Low Risk  Adult (2 of 2 - PCV13) 01/22/2007  . INFLUENZA VACCINE  10/16/2017  . DEXA SCAN  Completed   Fall Risk  10/28/2016 06/20/2015  Falls in the past year? No No  Risk for fall due to : - Impaired balance/gait   Functional Status Survey:    Vitals:   10/01/17 1436  BP: 137/82  Pulse: 94  Resp: 20  Temp: 97.7 F (36.5 C)  TempSrc: Oral  SpO2: 94%  Weight: 123 lb 14.4 oz (56.2 kg)  Height: 5\' 1"  (1.549 m)   Body mass index is 23.41 kg/m.   Wt Readings from Last 3 Encounters:  10/01/17 123 lb 14.4 oz (56.2 kg)  09/24/17 122 lb 11.2 oz (55.7 kg)  09/10/17 128 lb 8 oz (58.3 kg)   Physical Exam  Constitutional: No distress.  Frail elderly female  HENT:  Head: Normocephalic and atraumatic.  Mouth/Throat: Oropharynx is clear and moist.  Eyes: Pupils are equal, round, and reactive to light. EOM are normal. Right eye exhibits no discharge. Left eye exhibits no discharge.  Neck: Normal range of motion.  Neck supple.  Cardiovascular: Normal rate and regular rhythm.  Pulmonary/Chest: Effort normal and breath sounds normal. No respiratory distress. She has no wheezes. She has no rales.  Abdominal: Soft. Bowel sounds are normal. There is no tenderness. There is no guarding.  Musculoskeletal: She exhibits edema.  Trace leg edema, geri sleeves, unsteady gait, uses wheelchair for ambulation  Lymphadenopathy:    She has no cervical adenopathy.  Neurological: She is alert.  Oriented to self  Skin: Skin is warm and dry. She is not diaphoretic.  Psychiatric:  Pleasantly confused    Labs reviewed: Recent Labs    12/12/16 05/15/17 09/08/17 09/25/17  NA 139  139 140  140 142 143  143  K 4.0  4.0 4.2  4.2 4.6 4.5  4.3  CL 103 104 104  --   CO2 28 30 31   --   BUN 24*  24* 25* 37* 36*  CREATININE 0.9  0.93 0.8  0.82 1.0 1.0  1.00  CALCIUM 9.4 9.3  9.3 9.3 9.4  MG  --  2.1  --   --    Recent Labs    12/12/16 05/15/17 09/08/17  AST 17  17 16  16 18   ALT 8  8 8  8 10   ALKPHOS 79  79 70  70 90  BILITOT 0.5 0.3  --   PROT 6.6 6.5  6.5 6.5  ALBUMIN 3.2 3.2  3.2 3.3   Recent Labs    12/12/16 05/15/17 09/02/17 09/08/17  WBC 9.8  9.8 9.6  9.6 7.4 9.6  HGB 13.6  13.6 13.0  13.0 14.1 13.2  HCT 42  42 39  38.8 42.4 40  MCV  --   --  92.6  --   PLT 350 332  --  263   Lab Results  Component Value Date   TSH 1.09 05/15/2017   TSH 1.09 05/15/2017   Lab Results  Component Value Date   HGBA1C 5.8 12/16/2016   Lab Results  Component Value Date   CHOL 174 12/16/2016   CHOL 174 12/16/2016   HDL 68 12/16/2016   HDL 68 12/16/2016   LDLCALC 80 12/16/2016   LDLCALC 80 12/16/2016   TRIG 159 12/16/2016    Significant Diagnostic Results in last 30 days:  No results found.  Assessment/Plan  1. Essential hypertension Overall stable BP.   2. PVD (peripheral vascular disease) (  HCC) Continue torsemide 20 mg daily and monitor, bmp reviewed.   3. Gastroesophageal  reflux disease without esophagitis Continue ranitidine and omeprazole  4. Alzheimer's dementia with behavioral disturbance, unspecified timing of dementia onset Supportive care. Continue seroquel 25 mg bid and remeron. Continue donepezil 10 mg daily and memantine 10 mg bid    Family/ staff Communication: reviewed care plan with patient and charge nurse.    Labs/tests ordered:  none   Blanchie Serve, MD Internal Medicine [facility] [address] [place], [place] [address] Cell Phone (Monday-Friday 8 am - 5 pm): [phone] On Call: [phone] and follow prompts after 5 pm and on weekends Office Phone: [phone] Office Fax: [phone]

## 2017-10-29 ENCOUNTER — Encounter: Payer: Self-pay | Admitting: Internal Medicine

## 2017-11-03 ENCOUNTER — Encounter: Payer: Self-pay | Admitting: Family

## 2017-11-03 ENCOUNTER — Non-Acute Institutional Stay (SKILLED_NURSING_FACILITY): Payer: Medicare Other | Admitting: Family

## 2017-11-03 DIAGNOSIS — G309 Alzheimer's disease, unspecified: Secondary | ICD-10-CM | POA: Diagnosis not present

## 2017-11-03 DIAGNOSIS — I10 Essential (primary) hypertension: Secondary | ICD-10-CM

## 2017-11-03 DIAGNOSIS — I739 Peripheral vascular disease, unspecified: Secondary | ICD-10-CM | POA: Diagnosis not present

## 2017-11-03 DIAGNOSIS — S81811A Laceration without foreign body, right lower leg, initial encounter: Secondary | ICD-10-CM | POA: Diagnosis not present

## 2017-11-03 DIAGNOSIS — K219 Gastro-esophageal reflux disease without esophagitis: Secondary | ICD-10-CM | POA: Diagnosis not present

## 2017-11-03 DIAGNOSIS — F0281 Dementia in other diseases classified elsewhere with behavioral disturbance: Secondary | ICD-10-CM

## 2017-11-03 NOTE — Progress Notes (Signed)
Location:  [facility] Room Number: 32 Place of Service:  SNF (31) Provider: Tavarious Freel FNP-C   Blanchie Serve, MD  Patient Care Team: Blanchie Serve, MD as PCP - General (Internal Medicine) Jettie Booze, MD as Consulting Physician (Cardiology) Marchia Bond, MD as Consulting Physician (Orthopedic Surgery) Kathrynn Ducking, MD as Consulting Physician (Neurology) Brand Males, MD as Consulting Physician (Pulmonary Disease) Merelin Human, Nelda Bucks, NP as Nurse Practitioner (Family Medicine)  Extended Emergency Contact Information Primary Emergency Contact: [place] [employer] Phone: [phone] Relation: Son Secondary Emergency Contact: Ricardo [place] Phone: [phone] Mobile Phone: [phone] Relation: Daughter  Code Status: DNR Goals of care: Advanced Directive information Advanced Directives 11/03/2017  Does Patient Have a Medical Advance Directive? Yes  Type of Paramedic of [place];Out of facility DNR (pink MOST or yellow form);Living will  Does patient want to make changes to medical advance directive? -  Copy of [place] in Chart? Yes  Pre-existing out of facility DNR order (yellow form or pink MOST form) Yellow form placed in chart (order not valid for inpatient use);Pink MOST form placed in chart (order not valid for inpatient use)     Chief Complaint  Patient presents with  . Medical Management of Chronic Issues    HPI:  Pt is a 84 y.o. female seen today [facility] for medical management of chronic diseases.she has a medical history of HTN,PVD,GERD,Alzheimer's dementia with behavioral disturbance among other conditions.she is seen in her room today.Facility Nurse states patient continues to be confused.she looks for her husband and asks where to get a bus to get out of the facility.No fever, chills or acute illness.she was  observed on the floor 10/25/2017 after raising her recliner to the highest position.Her recliner now unplugged whenever patient is sitting on it.she has had no recent weight changes.   Past Medical History:  Diagnosis Date  . Alzheimer's disease 06/22/2015  . Anemia    iron deficient  . Anemia   . Anxiety   . Chronic airway obstruction, not elsewhere classified   . Chronic back pain   . Chronic cystitis   . Cystitis, chronic    Dr Gaynelle Arabian  . Degenerative arthritis   . Depression   . Disturbance of skin sensation   . Disturbance of skin sensation   . Dyslipidemia   . Dyspnea    Dr Chase Caller  . Esophageal reflux   . Essential and other specified forms of tremor 12/11/2012  . Foot drop, bilateral 12/21/2013  . Gait disorder   . GERD (gastroesophageal reflux disease)   . Hemorrhoids   . Hereditary and idiopathic peripheral neuropathy 10/11/2015  . Hernia   . History of cerebrovascular disease    Moderate level small vessel disease  . Hyperlipidemia    mild  . Hypertension   . Osteoarthritis    right knee injection per Dr Trudie Reed  . Osteoporosis   . Other abnormal clinical finding   . Pain in joint, upper arm   . Peripheral vascular disease ([place])   . Rheumatoid arthritis ([place])    Dr Trudie Reed  . Tobacco use disorder   . Tremor    and gait disorder--Dr Jannifer Franklin  . Unspecified vitamin D deficiency    Past Surgical History:  Procedure Laterality Date  . APPENDECTOMY    . BACK SURGERY Bilateral    X2  . Bladder resuspension procedure    . CATARACT  EXTRACTION Bilateral   . FEMUR IM NAIL Right 02/22/2014   Procedure: INTRAMEDULLARY (IM) NAIL FEMORAL;  Surgeon: Johnny Bridge, MD;  Location: [facility];  Service: Orthopedics;  Laterality: Right;  . INGUINAL HERNIA REPAIR Bilateral   . ORIF ANKLE FRACTURE Left 09/19/2016   Procedure: OPEN REDUCTION INTERNAL FIXATION (ORIF) ANKLE FRACTURE;  Surgeon: Vickey Huger, MD;  Location: [facility];  Service: Orthopedics;  Laterality: Left;  .  TONSILLECTOMY      Allergies  Allergen Reactions  . Novocain [Procaine]     Passed  out  . Limbrel [Flavocoxid]     Dizziness   . Sertraline Anxiety    Allergies as of 11/03/2017      Reactions   Novocain [procaine]    Passed  out   Limbrel [flavocoxid]    Dizziness   Sertraline Anxiety      Medication List        Accurate as of 11/03/17  4:16 PM. Always use your most recent med list.          acetaminophen 650 MG CR tablet Commonly known as:  TYLENOL Take 650 mg by mouth 2 (two) times daily.   alum & mag hydroxide-simeth 200-200-20 MG/5ML suspension Commonly known as:  MAALOX/MYLANTA Take 30 mLs by mouth every 4 (four) hours as needed for indigestion or heartburn.   CALTRATE 600+D 600-400 MG-UNIT tablet Generic drug:  Calcium Carbonate-Vitamin D Take 1 tablet by mouth daily.   donepezil 10 MG tablet Commonly known as:  ARICEPT Take 10 mg by mouth at bedtime.   LEVSIN/SL 0.125 MG Subl Generic drug:  Hyoscyamine Sulfate SL Place under the tongue every 12 (twelve) hours as needed (for cramps).   LORazepam 0.5 MG tablet Commonly known as:  ATIVAN Take 0.5 mg by mouth 2 (two) times daily.   memantine 10 MG tablet Commonly known as:  NAMENDA Take 10 mg by mouth 2 (two) times daily.   metoprolol tartrate 25 MG tablet Commonly known as:  LOPRESSOR Take 12.5 mg by mouth 2 (two) times daily. Hold for SBP <110 or HR <60/min   mirtazapine 15 MG tablet Commonly known as:  REMERON Take 15 mg by mouth at bedtime.   multivitamin tablet Take 1 tablet by mouth daily.   omeprazole 20 MG capsule Commonly known as:  PRILOSEC Take 20 mg by mouth daily.   ondansetron 4 MG tablet Commonly known as:  ZOFRAN Take 4 mg by mouth every 6 (six) hours as needed for nausea or vomiting.   OSTEO BI-FLEX TRIPLE STRENGTH Tabs Take 1 tablet by mouth daily.   OXYGEN Inhale 2 L into the lungs as needed.   potassium chloride 10 MEQ tablet Commonly known as:   K-DUR,KLOR-CON Take 10 mEq by mouth daily.   QUEtiapine 25 MG tablet Commonly known as:  SEROQUEL Take 25 mg by mouth 2 (two) times daily. At 9AM and 1PM   ranitidine 150 MG tablet Commonly known as:  ZANTAC Take 150 mg by mouth at bedtime.   RESOURCE 2.0 Liqd Take 120 mLs by mouth 3 (three) times daily.   sennosides-docusate sodium 8.6-50 MG tablet Commonly known as:  SENOKOT-S Take 2 tablets by mouth daily.   torsemide 20 MG tablet Commonly known as:  DEMADEX Take 20 mg by mouth daily.       Review of Systems  Unable to perform ROS: Dementia (additional information provided by facility Nurse )    Immunization History  Administered Date(s) Administered  . Influenza Whole 01/16/2010  .  Influenza-Unspecified 02/03/2006, 01/09/2007, 01/05/2013, 12/22/2014, 12/28/2015, 12/26/2016  . PPD Test 05/01/2004, 02/24/2014  . Pneumococcal Polysaccharide-23 01/21/2006  . Pneumococcal-Unspecified 12/19/1998  . Td 03/18/1998  . Tdap 09/18/2016   Pertinent  Health Maintenance Due  Topic Date Due  . PNA vac Low Risk Adult (2 of 2 - PCV13) 01/22/2007  . INFLUENZA VACCINE  10/16/2017  . DEXA SCAN  Completed   Fall Risk  10/28/2016 06/20/2015  Falls in the past year? No No  Risk for fall due to : - Impaired balance/gait    Vitals:   11/03/17 1318  BP: (!) 152/87  Pulse: 99  Resp: 20  Temp: 98.8 F (37.1 C)  SpO2: 94%  Weight: 127 lb 9.6 oz (57.9 kg)  Height: 5\' 1"  (1.549 m)   Body mass index is 24.11 kg/m. Physical Exam  Constitutional: She appears well-developed.  Elderly in no acute distress   HENT:  Head: Normocephalic.  Right Ear: External ear normal.  Left Ear: External ear normal.  Mouth/Throat: Oropharynx is clear and moist. No oropharyngeal exudate.  HOH  Eyes: Pupils are equal, round, and reactive to light. Conjunctivae and EOM are normal. Right eye exhibits no discharge. Left eye exhibits no discharge. No scleral icterus.  Neck: Normal range of motion. No  JVD present. No thyromegaly present.  Cardiovascular: Normal rate, regular rhythm, normal heart sounds and intact distal pulses. Exam reveals no gallop and no friction rub.  No murmur heard. Pulmonary/Chest: Effort normal and breath sounds normal. No stridor. No respiratory distress. She has no wheezes. She has no rales.  Abdominal: Soft. Bowel sounds are normal. She exhibits no distension and no mass. There is no tenderness. There is no rebound and no guarding.  Genitourinary:  Genitourinary Comments: Incontinent   Musculoskeletal: She exhibits no tenderness.  Unsteady gait self propel on her wheelchair.Bilateral lower extremities edema.knee high ted hose in place.   Lymphadenopathy:    She has no cervical adenopathy.  Neurological: Gait abnormal.  Pleasantly confused at her baseline   Skin: Skin is warm and dry. No rash noted. No erythema. No pallor.  Right upper leg skin tear dressing dry and clean.No signs of infections.surrounding skin tissue chronic discoloration  dark purple in color.   Psychiatric: She has a normal mood and affect. Her speech is normal and behavior is normal. Judgment normal. She exhibits abnormal recent memory.  Nursing note and vitals reviewed.   Labs reviewed: Recent Labs    12/12/16 05/15/17 09/08/17 09/25/17  NA 139  139 140  140 142 143  143  K 4.0  4.0 4.2  4.2 4.6 4.5  4.3  CL 103 104 104  --   CO2 28 30 31   --   BUN 24*  24* 25* 37* 36*  CREATININE 0.9  0.93 0.8  0.82 1.0 1.0  1.00  CALCIUM 9.4 9.3  9.3 9.3 9.4  MG  --  2.1  --   --    Recent Labs    12/12/16 05/15/17 09/08/17  AST 17  17 16  16 18   ALT 8  8 8  8 10   ALKPHOS 79  79 70  70 90  BILITOT 0.5 0.3  --   PROT 6.6 6.5  6.5 6.5  ALBUMIN 3.2 3.2  3.2 3.3   Recent Labs    12/12/16 05/15/17 09/02/17 09/08/17  WBC 9.8  9.8 9.6  9.6 7.4 9.6  HGB 13.6  13.6 13.0  13.0 14.1 13.2  HCT 42  42 39  38.8 42.4 40  MCV  --   --  92.6  --   PLT 350 332  --  263   Lab  Results  Component Value Date   TSH 1.09 05/15/2017   TSH 1.09 05/15/2017   Lab Results  Component Value Date   HGBA1C 5.8 12/16/2016   Lab Results  Component Value Date   CHOL 174 12/16/2016   CHOL 174 12/16/2016   HDL 68 12/16/2016   HDL 68 12/16/2016   LDLCALC 80 12/16/2016   LDLCALC 80 12/16/2016   TRIG 159 12/16/2016    Significant Diagnostic Results in last 30 days:  No results found.  Assessment/Plan 1. PVD (peripheral vascular disease) (HCC) Bilateral lower extremities edema.No ulceration noted.continue on Torsemide 20 mg tablet daily and Knee high ted hose daily in the morning and off at bedtime.continue to monitor.  2. Essential hypertension B/p reviewed stable.continue on metoprolol tartrate 12.5 mg tablet twice daily and Furosemide 20 mg tablet daily.Monitor BMP  3. Gastroesophageal reflux disease without esophagitis Continue on Omeprazole 20 mg capsule and Ranitidine 150 mg tablet at bedtime.monitor mg level and vit B12   4. Alzheimer's dementia with behavioral disturbance, unspecified timing of dementia onset No new behavioral issues.continues to look for husband and ask staff for a place to take the bus so she can get out of the facility.Also says " help! Help! " without needing anything.continue on Seroquel 25 mg tablet twice daily,Memantine 10 mg tablet twice daily, Aricept 10 mg tablet daily and mirtazapine 15 mg tablet daily.continue with supportive care.   5. Skin tear of right upper Leg   Afebrile.No signs or symptoms of infections.Hx PVD.continue to monitor for signs /symptoms of infections.   Family/ staff Communication: Reviewed plan of care with patient and facility Nurse.  Labs/tests ordered: None   Bona Hubbard C Lexie Koehl, NP

## 2017-11-28 ENCOUNTER — Non-Acute Institutional Stay (SKILLED_NURSING_FACILITY): Payer: Medicare Other

## 2017-11-28 DIAGNOSIS — Z0000 Encounter for general adult medical examination without abnormal findings: Secondary | ICD-10-CM

## 2017-11-28 NOTE — Patient Instructions (Addendum)
[name] , Thank you for taking time to come for your Medicare Wellness Visit. I appreciate your ongoing commitment to your health goals. Please review the following plan we discussed and let me know if I can assist you in the future.   Screening recommendations/referrals: Colonoscopy excluded, over age 82 Mammogram excluded, over age 55 Bone Density up to date Recommended yearly ophthalmology/optometry visit for glaucoma screening and checkup Recommended yearly dental visit for hygiene and checkup  Vaccinations: Influenza vaccine due, will receive at [facility] Pneumococcal vaccine 13 due, ordered Tdap vaccine up to date, due 09/19/2026 Shingles vaccine not in past records    Advanced directives: in chart  Conditions/risks identified: none  Next appointment: Dr. Bubba Camp makes rounds   Preventive Care 4 Years and Older, Female Preventive care refers to lifestyle choices and visits with your health care provider that can promote health and wellness. What does preventive care include?  A yearly physical exam. This is also called an annual well check.  Dental exams once or twice a year.  Routine eye exams. Ask your health care provider how often you should have your eyes checked.  Personal lifestyle choices, including:  Daily care of your teeth and gums.  Regular physical activity.  Eating a healthy diet.  Avoiding tobacco and drug use.  Limiting alcohol use.  Practicing safe sex.  Taking low-dose aspirin every day.  Taking vitamin and mineral supplements as recommended by your health care provider. What happens during an annual well check? The services and screenings done by your health care provider during your annual well check will depend on your age, overall health, lifestyle risk factors, and family history of disease. Counseling  Your health care provider may ask you questions about your:  Alcohol use.  Tobacco use.  Drug use.  Emotional well-being.  Home  and relationship well-being.  Sexual activity.  Eating habits.  History of falls.  Memory and ability to understand (cognition).  Work and work Statistician.  Reproductive health. Screening  You may have the following tests or measurements:  Height, weight, and BMI.  Blood pressure.  Lipid and cholesterol levels. These may be checked every 5 years, or more frequently if you are over 71 years old.  Skin check.  Lung cancer screening. You may have this screening every year starting at age 67 if you have a 30-pack-year history of smoking and currently smoke or have quit within the past 15 years.  Fecal occult blood test (FOBT) of the stool. You may have this test every year starting at age 21.  Flexible sigmoidoscopy or colonoscopy. You may have a sigmoidoscopy every 5 years or a colonoscopy every 10 years starting at age 1.  Hepatitis C blood test.  Hepatitis B blood test.  Sexually transmitted disease (STD) testing.  Diabetes screening. This is done by checking your blood sugar (glucose) after you have not eaten for a while (fasting). You may have this done every 1-3 years.  Bone density scan. This is done to screen for osteoporosis. You may have this done starting at age 35.  Mammogram. This may be done every 1-2 years. Talk to your health care provider about how often you should have regular mammograms. Talk with your health care provider about your test results, treatment options, and if necessary, the need for more tests. Vaccines  Your health care provider may recommend certain vaccines, such as:  Influenza vaccine. This is recommended every year.  Tetanus, diphtheria, and acellular pertussis (Tdap, Td) vaccine. You  may need a Td booster every 10 years.  Zoster vaccine. You may need this after age 6.  Pneumococcal 13-valent conjugate (PCV13) vaccine. One dose is recommended after age 86.  Pneumococcal polysaccharide (PPSV23) vaccine. One dose is recommended  after age 97. Talk to your health care provider about which screenings and vaccines you need and how often you need them. This information is not intended to replace advice given to you by your health care provider. Make sure you discuss any questions you have with your health care provider. Document Released: 03/31/2015 Document Revised: 11/22/2015 Document Reviewed: 01/03/2015 Elsevier Interactive Patient Education  2017 [place] Prevention in the Home Falls can cause injuries. They can happen to people of all ages. There are many things you can do to make your home safe and to help prevent falls. What can I do on the outside of my home?  Regularly fix the edges of walkways and driveways and fix any cracks.  Remove anything that might make you trip as you walk through a door, such as a raised step or threshold.  Trim any bushes or trees on the path to your home.  Use bright outdoor lighting.  Clear any walking paths of anything that might make someone trip, such as rocks or tools.  Regularly check to see if handrails are loose or broken. Make sure that both sides of any steps have handrails.  Any raised decks and porches should have guardrails on the edges.  Have any leaves, snow, or ice cleared regularly.  Use sand or salt on walking paths during winter.  Clean up any spills in your garage right away. This includes oil or grease spills. What can I do in the bathroom?  Use night lights.  Install grab bars by the toilet and in the tub and shower. Do not use towel bars as grab bars.  Use non-skid mats or decals in the tub or shower.  If you need to sit down in the shower, use a plastic, non-slip stool.  Keep the floor dry. Clean up any water that spills on the floor as soon as it happens.  Remove soap buildup in the tub or shower regularly.  Attach bath mats securely with double-sided non-slip rug tape.  Do not have throw rugs and other things on the floor  that can make you trip. What can I do in the bedroom?  Use night lights.  Make sure that you have a light by your bed that is easy to reach.  Do not use any sheets or blankets that are too big for your bed. They should not hang down onto the floor.  Have a firm chair that has side arms. You can use this for support while you get dressed.  Do not have throw rugs and other things on the floor that can make you trip. What can I do in the kitchen?  Clean up any spills right away.  Avoid walking on wet floors.  Keep items that you use a lot in easy-to-reach places.  If you need to reach something above you, use a strong step stool that has a grab bar.  Keep electrical cords out of the way.  Do not use floor polish or wax that makes floors slippery. If you must use wax, use non-skid floor wax.  Do not have throw rugs and other things on the floor that can make you trip. What can I do with my stairs?  Do not leave any items  on the stairs.  Make sure that there are handrails on both sides of the stairs and use them. Fix handrails that are broken or loose. Make sure that handrails are as long as the stairways.  Check any carpeting to make sure that it is firmly attached to the stairs. Fix any carpet that is loose or worn.  Avoid having throw rugs at the top or bottom of the stairs. If you do have throw rugs, attach them to the floor with carpet tape.  Make sure that you have a light switch at the top of the stairs and the bottom of the stairs. If you do not have them, ask someone to add them for you. What else can I do to help prevent falls?  Wear shoes that:  Do not have high heels.  Have rubber bottoms.  Are comfortable and fit you well.  Are closed at the toe. Do not wear sandals.  If you use a stepladder:  Make sure that it is fully opened. Do not climb a closed stepladder.  Make sure that both sides of the stepladder are locked into place.  Ask someone to hold it  for you, if possible.  Clearly mark and make sure that you can see:  Any grab bars or handrails.  First and last steps.  Where the edge of each step is.  Use tools that help you move around (mobility aids) if they are needed. These include:  Canes.  Walkers.  Scooters.  Crutches.  Turn on the lights when you go into a dark area. Replace any light bulbs as soon as they burn out.  Set up your furniture so you have a clear path. Avoid moving your furniture around.  If any of your floors are uneven, fix them.  If there are any pets around you, be aware of where they are.  Review your medicines with your doctor. Some medicines can make you feel dizzy. This can increase your chance of falling. Ask your doctor what other things that you can do to help prevent falls. This information is not intended to replace advice given to you by your health care provider. Make sure you discuss any questions you have with your health care provider. Document Released: 12/29/2008 Document Revised: 08/10/2015 Document Reviewed: 04/08/2014 Elsevier Interactive Patient Education  2017 [employer].

## 2017-11-28 NOTE — Progress Notes (Addendum)
Subjective:   [name] is a 84 y.o. female who presents for Medicare Annual (Subsequent) preventive examination at [facility]  Last AWV-10/28/2016    Objective:     Vitals: BP 135/80 (BP Location: Left Arm, Patient Position: Sitting)   Pulse 91   Temp 98.3 F (36.8 C) (Oral)   Ht 5\' 1"  (1.549 m)   Wt 127 lb 9.6 oz (57.9 kg)   BMI 24.11 kg/m   Body mass index is 24.11 kg/m.  Advanced Directives 11/28/2017 11/03/2017 10/01/2017 09/24/2017 09/10/2017 09/03/2017 09/02/2017  Does Patient Have a Medical Advance Directive? Yes Yes - Yes Yes Yes Yes  Type of Advance Directive [place];Out of facility DNR (pink MOST or yellow form);Living will [place];Out of facility DNR (pink MOST or yellow form);Living will [place];Living will;Out of facility DNR (pink MOST or yellow form) [place];Out of facility DNR (pink MOST or yellow form);Living will [place];Living will;Out of facility DNR (pink MOST or yellow form) [place];Living will;Out of facility DNR (pink MOST or yellow form) [place];Out of facility DNR (pink MOST or yellow form);Living will  Does patient want to make changes to medical advance directive? No - Patient declined - No - Patient declined - No - Patient declined No - Patient declined -  Copy of [place] in Chart? Yes Yes Yes Yes Yes Yes Yes  Pre-existing out of facility DNR order (yellow form or pink MOST form) Yellow form placed in chart (order not valid for inpatient use);Pink MOST form placed in chart (order not valid for inpatient use) Yellow form placed in chart (order not valid for inpatient use);Pink MOST form placed in chart (order not valid for inpatient use) Yellow form placed in chart (order not valid for inpatient use);Pink MOST form placed in chart (order not valid for inpatient use) Yellow form  placed in chart (order not valid for inpatient use);Pink MOST form placed in chart (order not valid for inpatient use) Yellow form placed in chart (order not valid for inpatient use);Pink MOST form placed in chart (order not valid for inpatient use) Yellow form placed in chart (order not valid for inpatient use) Yellow form placed in chart (order not valid for inpatient use)    Tobacco Social History   Tobacco Use  Smoking Status Former Smoker  Smokeless Tobacco Never Used  Tobacco Comment   "very few years"     Counseling given: Not Answered Comment: "very few years"   Clinical Intake:  Pre-visit preparation completed: No  Pain : No/denies pain     Nutritional Risks: None Diabetes: No  How often do you need to have someone help you when you read instructions, pamphlets, or other written materials from your doctor or pharmacy?: 4 - Often What is the last grade level you completed in school?: High School  Interpreter Needed?: No  Information entered by :: Tyson Dense, RN  Past Medical History:  Diagnosis Date  . Alzheimer's disease 06/22/2015  . Anemia    iron deficient  . Anemia   . Anxiety   . Chronic airway obstruction, not elsewhere classified   . Chronic back pain   . Chronic cystitis   . Cystitis, chronic    Dr Gaynelle Arabian  . Degenerative arthritis   . Depression   . Disturbance of skin sensation   . Disturbance of skin sensation   . Dyslipidemia   .  Dyspnea    Dr Chase Caller  . Esophageal reflux   . Essential and other specified forms of tremor 12/11/2012  . Foot drop, bilateral 12/21/2013  . Gait disorder   . GERD (gastroesophageal reflux disease)   . Hemorrhoids   . Hereditary and idiopathic peripheral neuropathy 10/11/2015  . Hernia   . History of cerebrovascular disease    Moderate level small vessel disease  . Hyperlipidemia    mild  . Hypertension   . Osteoarthritis    right knee injection per Dr Trudie Reed  . Osteoporosis   . Other abnormal  clinical finding   . Pain in joint, upper arm   . Peripheral vascular disease ([place])   . Rheumatoid arthritis ([place])    Dr Trudie Reed  . Tobacco use disorder   . Tremor    and gait disorder--Dr Jannifer Franklin  . Unspecified vitamin D deficiency    Past Surgical History:  Procedure Laterality Date  . APPENDECTOMY    . BACK SURGERY Bilateral    X2  . Bladder resuspension procedure    . CATARACT EXTRACTION Bilateral   . FEMUR IM NAIL Right 02/22/2014   Procedure: INTRAMEDULLARY (IM) NAIL FEMORAL;  Surgeon: Johnny Bridge, MD;  Location: [facility];  Service: Orthopedics;  Laterality: Right;  . INGUINAL HERNIA REPAIR Bilateral   . ORIF ANKLE FRACTURE Left 09/19/2016   Procedure: OPEN REDUCTION INTERNAL FIXATION (ORIF) ANKLE FRACTURE;  Surgeon: Vickey Huger, MD;  Location: [facility];  Service: Orthopedics;  Laterality: Left;  . TONSILLECTOMY     Family History  Problem Relation Age of Onset  . Heart Problems Mother   . Diabetes Father   . Heart attack Father   . Cancer Sister        Brain tumor  . Emphysema Brother    Social History   Socioeconomic History  . Marital status: Widowed    Spouse name: Not on file  . Number of children: 3  . Years of education: college  . Highest education level: Not on file  Occupational History  . Occupation: Producer, television/film/video: RETIRED  . Occupation: Retired   Scientific laboratory technician  . Financial resource strain: Not hard at all  . Food insecurity:    Worry: Never true    Inability: Never true  . Transportation needs:    Medical: No    Non-medical: No  Tobacco Use  . Smoking status: Former Research scientist (life sciences)  . Smokeless tobacco: Never Used  . Tobacco comment: "very few years"  Substance and Sexual Activity  . Alcohol use: No    Alcohol/week: 0.0 standard drinks  . Drug use: No  . Sexual activity: Never  Lifestyle  . Physical activity:    Days per week: 3 days    Minutes per session: 30 min  . Stress: Only a little  Relationships  . Social connections:    Talks on  phone: Never    Gets together: More than three times a week    Attends religious service: Never    Active member of club or organization: No    Attends meetings of clubs or organizations: Never    Relationship status: Widowed  Other Topics Concern  . Not on file  Social History Narrative   Patient [facility] at [facility].    Patient is widowed.    Patient has 3 children.    Patient is right handed.    Patient has 2 years of Business school.       Tobacco  use, amount per day now: None      Past tobacco use, amount per day: Never      How many years did you use tobacco: Never      Alcohol use (drinks per week): None      Diet: N/A      Do you drink/eat things with caffeine? No      Marital status: Widowed             What year were you married?      Do you live in a house, apartment, assisted living, condo, trailer? Wing      Is it one or more stories? 1      How many persons live in your home? N/A      Do you have any pets in your home? 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 N/A             How often? N/A      Do you have a living will? Yes      Do you have a DNR form? N/A           If not, do you want to discuss one? N/A      Do you have signed POA/HPOA forms? Yes               Outpatient Encounter Medications as of 11/28/2017  Medication Sig  . acetaminophen (TYLENOL) 650 MG CR tablet Take 650 mg by mouth 2 (two) times daily.   Marland Kitchen alum & mag hydroxide-simeth (MAALOX/MYLANTA) 200-200-20 MG/5ML suspension Take 30 mLs by mouth every 4 (four) hours as needed for indigestion or heartburn.  . Calcium Carbonate-Vitamin D (CALTRATE 600+D) 600-400 MG-UNIT per tablet Take 1 tablet by mouth daily.  Marland Kitchen donepezil (ARICEPT) 10 MG tablet Take 10 mg by mouth at bedtime.  Marland Kitchen Hyoscyamine Sulfate SL (LEVSIN/SL) 0.125 MG SUBL Place under the tongue every 12 (twelve) hours as needed (for cramps).   . LORazepam (ATIVAN) 0.5 MG tablet Take 0.5  mg by mouth 2 (two) times daily.   . memantine (NAMENDA) 10 MG tablet Take 10 mg by mouth 2 (two) times daily.  . metoprolol tartrate (LOPRESSOR) 25 MG tablet Take 12.5 mg by mouth 2 (two) times daily. Hold for SBP <110 or HR <60/min  . mirtazapine (REMERON) 15 MG tablet Take 15 mg by mouth at bedtime.  . Misc Natural Products (OSTEO BI-FLEX TRIPLE STRENGTH) TABS Take 1 tablet by mouth daily.  . Multiple Vitamin (MULTIVITAMIN) tablet Take 1 tablet by mouth daily.  . Nutritional Supplements (RESOURCE 2.0) LIQD Take 120 mLs by mouth 3 (three) times daily.  Marland Kitchen omeprazole (PRILOSEC) 20 MG capsule Take 20 mg by mouth daily.  . ondansetron (ZOFRAN) 4 MG tablet Take 4 mg by mouth every 6 (six) hours as needed for nausea or vomiting.  . OXYGEN Inhale 2 L into the lungs as needed.  . potassium chloride (K-DUR,KLOR-CON) 10 MEQ tablet Take 10 mEq by mouth daily.  . QUEtiapine (SEROQUEL) 25 MG tablet Take 25 mg by mouth 2 (two) times daily. At 9AM and 1PM  . ranitidine (ZANTAC) 150 MG tablet Take 150 mg by mouth at bedtime.   . sennosides-docusate sodium (SENOKOT-S) 8.6-50 MG tablet Take 2 tablets by mouth daily.  Marland Kitchen torsemide (DEMADEX) 20 MG tablet Take 20 mg by mouth daily.   No facility-administered encounter medications on file as of  11/28/2017.     Activities of Daily Living In your present state of health, do you have any difficulty performing the following activities: 11/28/2017  Hearing? Y  Vision? N  Difficulty concentrating or making decisions? Y  Walking or climbing stairs? Y  Dressing or bathing? Y  Doing errands, shopping? Y  Preparing Food and eating ? Y  Using the Toilet? Y  In the past six months, have you accidently leaked urine? Y  Do you have problems with loss of bowel control? Y  Managing your Medications? Y  Managing your Finances? Y  Housekeeping or managing your Housekeeping? Y  Some recent data might be hidden    Patient Care Team: Blanchie Serve, MD as PCP - General  (Internal Medicine) Jettie Booze, MD as Consulting Physician (Cardiology) Marchia Bond, MD as Consulting Physician (Orthopedic Surgery) Kathrynn Ducking, MD as Consulting Physician (Neurology) Brand Males, MD as Consulting Physician (Pulmonary Disease) Ngetich, Nelda Bucks, NP as Nurse Practitioner (Family Medicine)    Assessment:   This is a routine wellness examination for [place].  Exercise Activities and Dietary recommendations Current Exercise Habits: Structured exercise class, Type of exercise: stretching, Time (Minutes): 30, Frequency (Times/Week): 3, Weekly Exercise (Minutes/Week): 90, Intensity: Mild, Exercise limited by: neurologic condition(s);orthopedic condition(s)  Goals   None     Fall Risk Fall Risk  11/28/2017 10/28/2016 06/20/2015  Falls in the past year? No No No  Risk for fall due to : - - Impaired balance/gait   Is the patient's home free of loose throw rugs in walkways, pet beds, electrical cords, etc?   yes      Grab bars in the bathroom? yes      Handrails on the stairs?   yes      Adequate lighting?   yes  Depression Screen PHQ 2/9 Scores 11/28/2017 10/28/2016 06/20/2015  PHQ - 2 Score 0 0 0     Cognitive Function MMSE - Mini Mental State Exam 11/28/2017 10/28/2016  Orientation to time 0 1  Orientation to Place 3 1  Registration 3 3  Attention/ Calculation 0 0  Recall 0 0  Language- name 2 objects 2 2  Language- repeat 1 0  Language- follow 3 step command 3 2  Language- read & follow direction 1 1  Write a sentence 0 0  Copy design 0 0  Total score 13 10        Immunization History  Administered Date(s) Administered  . Influenza Whole 01/16/2010  . Influenza-Unspecified 02/03/2006, 01/09/2007, 01/05/2013, 12/22/2014, 12/28/2015, 12/26/2016  . PPD Test 05/01/2004, 02/24/2014  . Pneumococcal Polysaccharide-23 01/21/2006  . Pneumococcal-Unspecified 12/19/1998  . Td 03/18/1998  . Tdap 09/18/2016    Qualifies for Shingles Vaccine? Not  in past records  Screening Tests Health Maintenance  Topic Date Due  . PNA vac Low Risk Adult (2 of 2 - PCV13) 01/22/2007  . INFLUENZA VACCINE  10/16/2017  . TETANUS/TDAP  09/19/2026  . DEXA SCAN  Completed    Cancer Screenings: Lung: Low Dose CT Chest recommended if Age 26-80 years, 30 pack-year currently smoking OR have quit w/in 15years. Patient does not qualify. Breast:  Up to date on Mammogram? Yes   Up to date of Bone Density/Dexa? Yes Colorectal: up to date  Additional Screenings:  Hepatitis C Screening: unable to appropriately accept or decline Flu vaccine due: will receive at [facility] due: ordered    Plan:    I have personally reviewed and addressed the Medicare Annual Wellness questionnaire  and have noted the following in the patient's chart:  A. Medical and social history B. Use of alcohol, tobacco or illicit drugs  C. Current medications and supplements D. Functional ability and status E.  Nutritional status F.  Physical activity G. Advance directives H. List of other physicians I.  Hospitalizations, surgeries, and ER visits in previous 12 months J.  [place] to include hearing, vision, cognitive, depression L. Referrals and appointments - none  In addition, I have reviewed and discussed with patient certain preventive protocols, quality metrics, and best practice recommendations. A written personalized care plan for preventive services as well as general preventive health recommendations were provided to patient.  See attached scanned questionnaire for additional information.   Signed,   Tyson Dense, RN Nurse Health Advisor  Patient Concerns: None

## 2017-12-02 ENCOUNTER — Encounter: Payer: Self-pay | Admitting: Family

## 2017-12-02 ENCOUNTER — Non-Acute Institutional Stay (SKILLED_NURSING_FACILITY): Payer: Medicare Other | Admitting: Family

## 2017-12-02 DIAGNOSIS — I739 Peripheral vascular disease, unspecified: Secondary | ICD-10-CM

## 2017-12-02 DIAGNOSIS — K219 Gastro-esophageal reflux disease without esophagitis: Secondary | ICD-10-CM

## 2017-12-02 DIAGNOSIS — S81812D Laceration without foreign body, left lower leg, subsequent encounter: Secondary | ICD-10-CM

## 2017-12-02 DIAGNOSIS — G309 Alzheimer's disease, unspecified: Secondary | ICD-10-CM | POA: Diagnosis not present

## 2017-12-02 DIAGNOSIS — I10 Essential (primary) hypertension: Secondary | ICD-10-CM | POA: Diagnosis not present

## 2017-12-02 DIAGNOSIS — F0281 Dementia in other diseases classified elsewhere with behavioral disturbance: Secondary | ICD-10-CM

## 2017-12-02 NOTE — Progress Notes (Signed)
Location:  [facility] Room Number: 32 Place of Service:  SNF (31) Provider: Laurelai Lepp FNP-C   Blanchie Serve, MD  Patient Care Team: Blanchie Serve, MD as PCP - General (Internal Medicine) Jettie Booze, MD as Consulting Physician (Cardiology) Marchia Bond, MD as Consulting Physician (Orthopedic Surgery) Kathrynn Ducking, MD as Consulting Physician (Neurology) Brand Males, MD as Consulting Physician (Pulmonary Disease) Latorie Montesano, Nelda Bucks, NP as Nurse Practitioner (Family Medicine)  Extended Emergency Contact Information Primary Emergency Contact: [place] Phone: [phone] Relation: Son Secondary Emergency Contact: Ricardo Jericho [place] of [place] Phone: [phone] Mobile Phone: [phone] Relation: Daughter  Code Status:  DNR  Goals of care: Advanced Directive information Advanced Directives 12/02/2017  Does Patient Have a Medical Advance Directive? Yes  Type of Paramedic of [place];Living will;Out of facility DNR (pink MOST or yellow form)  Does patient want to make changes to medical advance directive? No - Patient declined  Copy of [place] in Chart? Yes  Pre-existing out of facility DNR order (yellow form or pink MOST form) Yellow form placed in chart (order not valid for inpatient use);Pink MOST form placed in chart (order not valid for inpatient use)     Chief Complaint  Patient presents with  . Medical Management of Chronic Issues    Routine Visit    HPI:  Pt is a 84 y.o. female seen today [facility] for medical management of chronic diseases.She has a medical history of HTN,PVD, GERD,Alzheimer's dementia with behavioral disturbance among other conditions.she is seen in her room today with facility Nurse present at bedside.Her HPI and ROS is limited due to dementia.Nurse states patient continues to call out help!help!  All the time even but does not need any help when asked if she needs assistance.she has had no recent weight changes or fall episodes.Left upper leg skin tear dressing changes continue to be managed by facility Nurse.No fever or chills reported.    Past Medical History:  Diagnosis Date  . Alzheimer's disease 06/22/2015  . Anemia    iron deficient  . Anemia   . Anxiety   . Chronic airway obstruction, not elsewhere classified   . Chronic back pain   . Chronic cystitis   . Cystitis, chronic    Dr Gaynelle Arabian  . Degenerative arthritis   . Depression   . Disturbance of skin sensation   . Disturbance of skin sensation   . Dyslipidemia   . Dyspnea    Dr Chase Caller  . Esophageal reflux   . Essential and other specified forms of tremor 12/11/2012  . Foot drop, bilateral 12/21/2013  . Gait disorder   . GERD (gastroesophageal reflux disease)   . Hemorrhoids   . Hereditary and idiopathic peripheral neuropathy 10/11/2015  . Hernia   . History of cerebrovascular disease    Moderate level small vessel disease  . Hyperlipidemia    mild  . Hypertension   . Osteoarthritis    right knee injection per Dr Trudie Reed  . Osteoporosis   . Other abnormal clinical finding   . Pain in joint, upper arm   . Peripheral vascular disease ([place])   . Rheumatoid arthritis ([place])    Dr Trudie Reed  . Tobacco use disorder   . Tremor    and gait disorder--Dr Jannifer Franklin  . Unspecified vitamin D deficiency    Past Surgical History:  Procedure Laterality Date  . APPENDECTOMY    .  BACK SURGERY Bilateral    X2  . Bladder resuspension procedure    . CATARACT EXTRACTION Bilateral   . FEMUR IM NAIL Right 02/22/2014   Procedure: INTRAMEDULLARY (IM) NAIL FEMORAL;  Surgeon: Johnny Bridge, MD;  Location: [facility];  Service: Orthopedics;  Laterality: Right;  . INGUINAL HERNIA REPAIR Bilateral   . ORIF ANKLE FRACTURE Left 09/19/2016   Procedure: OPEN REDUCTION INTERNAL FIXATION (ORIF) ANKLE FRACTURE;  Surgeon: Vickey Huger, MD;   Location: [facility];  Service: Orthopedics;  Laterality: Left;  . TONSILLECTOMY      Allergies  Allergen Reactions  . Novocain [Procaine]     Passed  out  . Limbrel [Flavocoxid]     Dizziness   . Sertraline Anxiety    Allergies as of 12/02/2017      Reactions   Novocain [procaine]    Passed  out   Limbrel [flavocoxid]    Dizziness   Sertraline Anxiety      Medication List        Accurate as of 12/02/17  8:25 PM. Always use your most recent med list.          acetaminophen 650 MG CR tablet Commonly known as:  TYLENOL Take 650 mg by mouth 2 (two) times daily.   alum & mag hydroxide-simeth 200-200-20 MG/5ML suspension Commonly known as:  MAALOX/MYLANTA Take 30 mLs by mouth every 4 (four) hours as needed for indigestion or heartburn.   CALTRATE 600+D 600-400 MG-UNIT tablet Generic drug:  Calcium Carbonate-Vitamin D Take 1 tablet by mouth daily.   donepezil 10 MG tablet Commonly known as:  ARICEPT Take 10 mg by mouth at bedtime.   LEVSIN/SL 0.125 MG Subl Generic drug:  Hyoscyamine Sulfate SL Place under the tongue every 12 (twelve) hours as needed (for cramps).   LORazepam 0.5 MG tablet Commonly known as:  ATIVAN Take 0.5 mg by mouth 2 (two) times daily.   memantine 10 MG tablet Commonly known as:  NAMENDA Take 10 mg by mouth 2 (two) times daily.   metoprolol tartrate 25 MG tablet Commonly known as:  LOPRESSOR Take 12.5 mg by mouth 2 (two) times daily. Hold for SBP <110 or HR <60/min   mirtazapine 15 MG tablet Commonly known as:  REMERON Take 15 mg by mouth at bedtime.   multivitamin tablet Take 1 tablet by mouth daily.   omeprazole 20 MG capsule Commonly known as:  PRILOSEC Take 20 mg by mouth daily.   ondansetron 4 MG tablet Commonly known as:  ZOFRAN Take 4 mg by mouth every 6 (six) hours as needed for nausea or vomiting.   OSTEO BI-FLEX TRIPLE STRENGTH Tabs Take 1 tablet by mouth daily.   OXYGEN Inhale 2 L into the lungs as needed.     potassium chloride 10 MEQ tablet Commonly known as:  K-DUR,KLOR-CON Take 10 mEq by mouth daily.   QUEtiapine 25 MG tablet Commonly known as:  SEROQUEL Take 25 mg by mouth 2 (two) times daily. At 9AM and 1PM   ranitidine 150 MG tablet Commonly known as:  ZANTAC Take 150 mg by mouth at bedtime.   RESOURCE 2.0 Liqd Take 120 mLs by mouth 3 (three) times daily.   sennosides-docusate sodium 8.6-50 MG tablet Commonly known as:  SENOKOT-S Take 2 tablets by mouth daily.   torsemide 20 MG tablet Commonly known as:  DEMADEX Take 20 mg by mouth daily.       Review of Systems  Unable to perform ROS: Dementia (additional information provided  by facility Nurse )    Immunization History  Administered Date(s) Administered  . Influenza Whole 01/16/2010  . Influenza-Unspecified 02/03/2006, 01/09/2007, 01/05/2013, 12/22/2014, 12/28/2015, 12/26/2016  . PPD Test 05/01/2004, 02/24/2014  . Pneumococcal Polysaccharide-23 01/21/2006  . Pneumococcal-Unspecified 12/19/1998  . Td 03/18/1998  . Tdap 09/18/2016   Pertinent  Health Maintenance Due  Topic Date Due  . INFLUENZA VACCINE  10/16/2017  . DEXA SCAN  Completed  . PNA vac Low Risk Adult  Completed   Fall Risk  11/28/2017 10/28/2016 06/20/2015  Falls in the past year? No No No  Risk for fall due to : - - Impaired balance/gait   Functional Status Survey: Is the patient deaf or have difficulty hearing?: Yes Does the patient have difficulty seeing, even when wearing glasses/contacts?: Yes Does the patient have difficulty concentrating, remembering, or making decisions?: Yes Does the patient have difficulty walking or climbing stairs?: Yes Does the patient have difficulty dressing or bathing?: Yes Does the patient have difficulty doing errands alone such as visiting a doctor's office or shopping?: Yes  Vitals:   12/02/17 1412  BP: 136/80  Pulse: 90  Resp: 18  Temp: 97.8 F (36.6 C)  TempSrc: Oral  SpO2: 95%  Weight: 129 lb 12.8 oz  (58.9 kg)  Height: 5\' 1"  (1.549 m)   Body mass index is 24.53 kg/m. Physical Exam  Constitutional: She appears well-developed and well-nourished. No distress.  elderly  HENT:  Head: Normocephalic.  Right Ear: External ear normal.  Left Ear: External ear normal.  Mouth/Throat: Oropharynx is clear and moist. No oropharyngeal exudate.  Eyes: Pupils are equal, round, and reactive to light. Conjunctivae and EOM are normal. Right eye exhibits no discharge. Left eye exhibits no discharge. No scleral icterus.  Neck: Normal range of motion. No JVD present. No thyromegaly present.  Cardiovascular: Normal rate, regular rhythm and normal heart sounds. Exam reveals no gallop and no friction rub.  No murmur heard. Pulmonary/Chest: Effort normal and breath sounds normal. No respiratory distress. She has no wheezes. She has no rales.  Abdominal: Soft. Bowel sounds are normal. She exhibits no distension and no mass. There is no tenderness. There is no rebound and no guarding.  Musculoskeletal: She exhibits no edema or tenderness.  Moves x 4 extremities without any difficulties.self propels on wheelchair.Bilateral lower extremities 1-2 + edema Knee high ted hose in place.  Lymphadenopathy:    She has no cervical adenopathy.  Neurological: She is alert. Gait abnormal.  Pleasantly confused at her baseline   Skin: Skin is warm and dry. No rash noted. No erythema. No pallor.  Dry scaly skin to lower extremities.   Psychiatric: She has a normal mood and affect. Her speech is normal and behavior is normal. Judgment and thought content normal. Cognition and memory are impaired.  Nursing note and vitals reviewed.  Labs reviewed: Recent Labs    12/12/16 05/15/17 09/08/17 09/25/17  NA 139  139 140  140 142 143  143  K 4.0  4.0 4.2  4.2 4.6 4.5  4.3  CL 103 104 104  --   CO2 28 30 31   --   BUN 24*  24* 25* 37* 36*  CREATININE 0.9  0.93 0.8  0.82 1.0 1.0  1.00  CALCIUM 9.4 9.3  9.3 9.3 9.4  MG   --  2.1  --   --    Recent Labs    12/12/16 05/15/17 09/08/17  AST 17  17 16  16 18   ALT  8  8 8  8 10   ALKPHOS 79  79 70  70 90  BILITOT 0.5 0.3  --   PROT 6.6 6.5  6.5 6.5  ALBUMIN 3.2 3.2  3.2 3.3   Recent Labs    12/12/16 05/15/17 09/02/17 09/08/17  WBC 9.8  9.8 9.6  9.6 7.4 9.6  HGB 13.6  13.6 13.0  13.0 14.1 13.2  HCT 42  42 39  38.8 42.4 40  MCV  --   --  92.6  --   PLT 350 332  --  263   Lab Results  Component Value Date   TSH 1.09 05/15/2017   TSH 1.09 05/15/2017   Lab Results  Component Value Date   HGBA1C 5.8 12/16/2016   Lab Results  Component Value Date   CHOL 174 12/16/2016   CHOL 174 12/16/2016   HDL 68 12/16/2016   HDL 68 12/16/2016   LDLCALC 80 12/16/2016   LDLCALC 80 12/16/2016   TRIG 159 12/16/2016    Significant Diagnostic Results in last 30 days:  No results found.  Assessment/Plan 1. Skin tear of left lower leg without complication, subsequent encounter Afebrile.No signs of infections noted.continue to cleanse with saline,pat dry and cover with Telfa dressing.continue to monitor for s/Sx of infections.   2. Essential hypertension B/p readings reviewed stable.continue on metoprolol 12.5 mg tablet twice daily and torsemide 20 mg tablet daily.monitor BMP   3. PVD (peripheral vascular disease) (HCC) Bilateral lower extremities blanchable redness and 1-2 + pitting edema.continue on Knee high ted hose.continue on Torsemide 20 mg tablet daily.Off statin and ASA due to advance age and hight risk for falls.   4. Alzheimer's dementia with behavioral disturbance, unspecified timing of dementia onset No new behavior.continues to yelll out for help and confused.continue to reorient.continue on Namenda 10 mg tablet twice daily,donepezil 10 mg tablet at bedtime and Ativan 0.5 mg tablet twice daily and Remeron 15 mg tablet daily.  5. Gastroesophageal reflux disease without esophagitis Stable.continue on omeprazole 20 mg capsule daily and  Zantac 150 mg tablet twice daily.  Family/ staff Communication: Reviewed plan of care with patient and facility Nurse.   Labs/tests ordered: None   Adalia Pettis C Vertie Dibbern, NP

## 2018-01-02 DIAGNOSIS — R2681 Unsteadiness on feet: Secondary | ICD-10-CM | POA: Diagnosis not present

## 2018-01-02 DIAGNOSIS — R609 Edema, unspecified: Secondary | ICD-10-CM | POA: Diagnosis not present

## 2018-01-02 DIAGNOSIS — M25562 Pain in left knee: Secondary | ICD-10-CM | POA: Diagnosis not present

## 2018-01-02 DIAGNOSIS — K648 Other hemorrhoids: Secondary | ICD-10-CM | POA: Diagnosis not present

## 2018-01-02 DIAGNOSIS — M4800 Spinal stenosis, site unspecified: Secondary | ICD-10-CM | POA: Diagnosis not present

## 2018-01-02 DIAGNOSIS — K5900 Constipation, unspecified: Secondary | ICD-10-CM | POA: Diagnosis not present

## 2018-01-02 DIAGNOSIS — R4789 Other speech disturbances: Secondary | ICD-10-CM | POA: Diagnosis not present

## 2018-01-02 DIAGNOSIS — K219 Gastro-esophageal reflux disease without esophagitis: Secondary | ICD-10-CM | POA: Diagnosis not present

## 2018-01-02 DIAGNOSIS — H10023 Other mucopurulent conjunctivitis, bilateral: Secondary | ICD-10-CM | POA: Diagnosis not present

## 2018-01-02 DIAGNOSIS — Z9181 History of falling: Secondary | ICD-10-CM | POA: Diagnosis not present

## 2018-01-02 DIAGNOSIS — I679 Cerebrovascular disease, unspecified: Secondary | ICD-10-CM | POA: Diagnosis not present

## 2018-01-02 DIAGNOSIS — Z7409 Other reduced mobility: Secondary | ICD-10-CM | POA: Diagnosis not present

## 2018-01-02 DIAGNOSIS — F329 Major depressive disorder, single episode, unspecified: Secondary | ICD-10-CM | POA: Diagnosis not present

## 2018-01-02 DIAGNOSIS — G250 Essential tremor: Secondary | ICD-10-CM | POA: Diagnosis not present

## 2018-01-02 DIAGNOSIS — G309 Alzheimer's disease, unspecified: Secondary | ICD-10-CM | POA: Diagnosis not present

## 2018-01-02 DIAGNOSIS — G609 Hereditary and idiopathic neuropathy, unspecified: Secondary | ICD-10-CM | POA: Diagnosis not present

## 2018-01-02 DIAGNOSIS — M21371 Foot drop, right foot: Secondary | ICD-10-CM | POA: Diagnosis not present

## 2018-01-02 DIAGNOSIS — I10 Essential (primary) hypertension: Secondary | ICD-10-CM | POA: Diagnosis not present

## 2018-01-02 DIAGNOSIS — J984 Other disorders of lung: Secondary | ICD-10-CM | POA: Diagnosis not present

## 2018-01-02 DIAGNOSIS — R278 Other lack of coordination: Secondary | ICD-10-CM | POA: Diagnosis not present

## 2018-01-02 DIAGNOSIS — M21372 Foot drop, left foot: Secondary | ICD-10-CM | POA: Diagnosis not present

## 2018-01-02 DIAGNOSIS — M6281 Muscle weakness (generalized): Secondary | ICD-10-CM | POA: Diagnosis not present

## 2018-01-02 DIAGNOSIS — I2789 Other specified pulmonary heart diseases: Secondary | ICD-10-CM | POA: Diagnosis not present

## 2018-01-04 DIAGNOSIS — R4789 Other speech disturbances: Secondary | ICD-10-CM | POA: Diagnosis not present

## 2018-01-04 DIAGNOSIS — M6281 Muscle weakness (generalized): Secondary | ICD-10-CM | POA: Diagnosis not present

## 2018-01-04 DIAGNOSIS — Z9181 History of falling: Secondary | ICD-10-CM | POA: Diagnosis not present

## 2018-01-04 DIAGNOSIS — R2681 Unsteadiness on feet: Secondary | ICD-10-CM | POA: Diagnosis not present

## 2018-01-04 DIAGNOSIS — Z7409 Other reduced mobility: Secondary | ICD-10-CM | POA: Diagnosis not present

## 2018-01-04 DIAGNOSIS — R609 Edema, unspecified: Secondary | ICD-10-CM | POA: Diagnosis not present

## 2018-01-05 DIAGNOSIS — Z7409 Other reduced mobility: Secondary | ICD-10-CM | POA: Diagnosis not present

## 2018-01-05 DIAGNOSIS — R609 Edema, unspecified: Secondary | ICD-10-CM | POA: Diagnosis not present

## 2018-01-05 DIAGNOSIS — R2681 Unsteadiness on feet: Secondary | ICD-10-CM | POA: Diagnosis not present

## 2018-01-05 DIAGNOSIS — Z9181 History of falling: Secondary | ICD-10-CM | POA: Diagnosis not present

## 2018-01-05 DIAGNOSIS — M6281 Muscle weakness (generalized): Secondary | ICD-10-CM | POA: Diagnosis not present

## 2018-01-05 DIAGNOSIS — R4789 Other speech disturbances: Secondary | ICD-10-CM | POA: Diagnosis not present

## 2018-01-06 DIAGNOSIS — R2681 Unsteadiness on feet: Secondary | ICD-10-CM | POA: Diagnosis not present

## 2018-01-06 DIAGNOSIS — R4789 Other speech disturbances: Secondary | ICD-10-CM | POA: Diagnosis not present

## 2018-01-06 DIAGNOSIS — Z9181 History of falling: Secondary | ICD-10-CM | POA: Diagnosis not present

## 2018-01-06 DIAGNOSIS — Z7409 Other reduced mobility: Secondary | ICD-10-CM | POA: Diagnosis not present

## 2018-01-06 DIAGNOSIS — R609 Edema, unspecified: Secondary | ICD-10-CM | POA: Diagnosis not present

## 2018-01-06 DIAGNOSIS — M6281 Muscle weakness (generalized): Secondary | ICD-10-CM | POA: Diagnosis not present

## 2018-01-08 DIAGNOSIS — R2681 Unsteadiness on feet: Secondary | ICD-10-CM | POA: Diagnosis not present

## 2018-01-08 DIAGNOSIS — M6281 Muscle weakness (generalized): Secondary | ICD-10-CM | POA: Diagnosis not present

## 2018-01-08 DIAGNOSIS — Z9181 History of falling: Secondary | ICD-10-CM | POA: Diagnosis not present

## 2018-01-08 DIAGNOSIS — R609 Edema, unspecified: Secondary | ICD-10-CM | POA: Diagnosis not present

## 2018-01-08 DIAGNOSIS — Z7409 Other reduced mobility: Secondary | ICD-10-CM | POA: Diagnosis not present

## 2018-01-08 DIAGNOSIS — R4789 Other speech disturbances: Secondary | ICD-10-CM | POA: Diagnosis not present

## 2018-01-12 ENCOUNTER — Non-Acute Institutional Stay (SKILLED_NURSING_FACILITY): Payer: Medicare Other | Admitting: Family

## 2018-01-12 ENCOUNTER — Encounter: Payer: Self-pay | Admitting: Family

## 2018-01-12 DIAGNOSIS — K5901 Slow transit constipation: Secondary | ICD-10-CM

## 2018-01-12 DIAGNOSIS — M6281 Muscle weakness (generalized): Secondary | ICD-10-CM | POA: Diagnosis not present

## 2018-01-12 DIAGNOSIS — K648 Other hemorrhoids: Secondary | ICD-10-CM

## 2018-01-12 DIAGNOSIS — Z9181 History of falling: Secondary | ICD-10-CM | POA: Diagnosis not present

## 2018-01-12 DIAGNOSIS — R609 Edema, unspecified: Secondary | ICD-10-CM | POA: Diagnosis not present

## 2018-01-12 DIAGNOSIS — Z7409 Other reduced mobility: Secondary | ICD-10-CM | POA: Diagnosis not present

## 2018-01-12 DIAGNOSIS — R4789 Other speech disturbances: Secondary | ICD-10-CM | POA: Diagnosis not present

## 2018-01-12 DIAGNOSIS — R2681 Unsteadiness on feet: Secondary | ICD-10-CM | POA: Diagnosis not present

## 2018-01-12 NOTE — Progress Notes (Signed)
Location:  [facility] Room Number: N32A Place of Service:  SNF (31) Provider: Dinah Ngetich FNP-C  Ngetich, Nelda Bucks, NP  Patient Care Team: Ngetich, Nelda Bucks, NP as PCP - General (Family Medicine) Jettie Booze, MD as Consulting Physician (Cardiology) Marchia Bond, MD as Consulting Physician (Orthopedic Surgery) Kathrynn Ducking, MD as Consulting Physician (Neurology) Brand Males, MD as Consulting Physician (Pulmonary Disease)  Extended Emergency Contact Information Primary Emergency Contact: [place] Phone: [phone] Relation: Son Secondary Emergency Contact: Ricardo Jericho [place] of [place] Phone: [phone] Mobile Phone: [phone] Relation: Daughter  Code Status:  DNR Goals of care: Advanced Directive information Advanced Directives 01/12/2018  Does Patient Have a Medical Advance Directive? Yes  Type of Paramedic of [place];Living will;Out of facility DNR (pink MOST or yellow form)  Does patient want to make changes to medical advance directive? No - Patient declined  Copy of [place] in Chart? Yes  Pre-existing out of facility DNR order (yellow form or pink MOST form) Yellow form placed in chart (order not valid for inpatient use);Pink MOST form placed in chart (order not valid for inpatient use)     Chief Complaint  Patient presents with  . Acute Visit    Rectal Bleeding    HPI:  Pt is a 84 y.o. female seen today at [facility] for an acute visit for evaluation of rectal bleeding.she is seen in her room with Nurse present.Nurse reports patient had blood from the rectum.Patient unable to provide HPI and ROS due to he cognitive impairment.she denies any pain,fever or chills.   Past Medical History:  Diagnosis Date  . Alzheimer's disease ([place]) 06/22/2015  . Anemia    iron deficient  . Anemia   . Anxiety   . Chronic  airway obstruction, not elsewhere classified   . Chronic back pain   . Chronic cystitis   . Cystitis, chronic    Dr Gaynelle Arabian  . Degenerative arthritis   . Depression   . Disturbance of skin sensation   . Disturbance of skin sensation   . Dyslipidemia   . Dyspnea    Dr Chase Caller  . Esophageal reflux   . Essential and other specified forms of tremor 12/11/2012  . Foot drop, bilateral 12/21/2013  . Gait disorder   . GERD (gastroesophageal reflux disease)   . Hemorrhoids   . Hereditary and idiopathic peripheral neuropathy 10/11/2015  . Hernia   . History of cerebrovascular disease    Moderate level small vessel disease  . Hyperlipidemia    mild  . Hypertension   . Osteoarthritis    right knee injection per Dr Trudie Reed  . Osteoporosis   . Other abnormal clinical finding   . Pain in joint, upper arm   . Peripheral vascular disease ([place])   . Rheumatoid arthritis ([place])    Dr Trudie Reed  . Tobacco use disorder   . Tremor    and gait disorder--Dr Jannifer Franklin  . Unspecified vitamin D deficiency    Past Surgical History:  Procedure Laterality Date  . APPENDECTOMY    . BACK SURGERY Bilateral    X2  . Bladder resuspension procedure    . CATARACT EXTRACTION Bilateral   . FEMUR IM NAIL Right 02/22/2014   Procedure: INTRAMEDULLARY (IM) NAIL FEMORAL;  Surgeon: Johnny Bridge, MD;  Location: [facility];  Service: Orthopedics;  Laterality: Right;  . INGUINAL HERNIA REPAIR Bilateral   . ORIF  ANKLE FRACTURE Left 09/19/2016   Procedure: OPEN REDUCTION INTERNAL FIXATION (ORIF) ANKLE FRACTURE;  Surgeon: Vickey Huger, MD;  Location: [facility];  Service: Orthopedics;  Laterality: Left;  . TONSILLECTOMY      Allergies  Allergen Reactions  . Novocain [Procaine]     Passed  out  . Limbrel [Flavocoxid]     Dizziness   . Sertraline Anxiety    Outpatient Encounter Medications as of 01/12/2018  Medication Sig  . acetaminophen (TYLENOL) 650 MG CR tablet Take 650 mg by mouth 2 (two) times daily.   Marland Kitchen alum &  mag hydroxide-simeth (MAALOX/MYLANTA) 200-200-20 MG/5ML suspension Take 30 mLs by mouth every 4 (four) hours as needed for indigestion or heartburn.  . Calcium Carbonate-Vitamin D (CALTRATE 600+D) 600-400 MG-UNIT per tablet Take 1 tablet by mouth daily.  Marland Kitchen donepezil (ARICEPT) 10 MG tablet Take 10 mg by mouth at bedtime.  Marland Kitchen Hyoscyamine Sulfate SL (LEVSIN/SL) 0.125 MG SUBL Place under the tongue every 12 (twelve) hours as needed (for cramps).   . LORazepam (ATIVAN) 0.5 MG tablet Take 0.5 mg by mouth 2 (two) times daily.   . memantine (NAMENDA) 10 MG tablet Take 10 mg by mouth 2 (two) times daily.  . metoprolol tartrate (LOPRESSOR) 25 MG tablet Take 12.5 mg by mouth 2 (two) times daily. Hold for SBP <110 or HR <60/min  . mirtazapine (REMERON) 15 MG tablet Take 15 mg by mouth at bedtime.   . Misc Natural Products (OSTEO BI-FLEX TRIPLE STRENGTH) TABS Take 1 tablet by mouth daily.  . Multiple Vitamin (MULTIVITAMIN) tablet Take 1 tablet by mouth daily.  . Nutritional Supplements (RESOURCE 2.0) LIQD Take 120 mLs by mouth 3 (three) times daily.  Marland Kitchen omeprazole (PRILOSEC) 20 MG capsule Take 20 mg by mouth daily.  . ondansetron (ZOFRAN) 4 MG tablet Take 4 mg by mouth every 6 (six) hours as needed for nausea or vomiting.  . OXYGEN Inhale 2 L into the lungs as needed.  . potassium chloride (K-DUR,KLOR-CON) 10 MEQ tablet Take 10 mEq by mouth daily.  . QUEtiapine (SEROQUEL) 25 MG tablet Take 25 mg by mouth 2 (two) times daily. At 9AM and 1PM  . ranitidine (ZANTAC) 150 MG tablet Take 150 mg by mouth at bedtime.   . sennosides-docusate sodium (SENOKOT-S) 8.6-50 MG tablet Take 2 tablets by mouth daily.  Marland Kitchen torsemide (DEMADEX) 20 MG tablet Take 20 mg by mouth daily.   No facility-administered encounter medications on file as of 01/12/2018.     Review of Systems  Unable to perform ROS: Dementia (additional information provided by facility Nurse )    Immunization History  Administered Date(s) Administered  .  Influenza Whole 01/16/2010  . Influenza-Unspecified 02/03/2006, 01/09/2007, 01/05/2013, 12/22/2014, 12/28/2015, 12/26/2016, 12/29/2017  . PPD Test 05/01/2004, 02/24/2014  . Pneumococcal Polysaccharide-23 01/21/2006  . Pneumococcal-Unspecified 12/19/1998  . Td 03/18/1998  . Tdap 09/18/2016   Pertinent  Health Maintenance Due  Topic Date Due  . INFLUENZA VACCINE  Completed  . DEXA SCAN  Completed  . PNA vac Low Risk Adult  Completed   Fall Risk  11/28/2017 10/28/2016 06/20/2015  Falls in the past year? No No No  Risk for fall due to : - - Impaired balance/gait    Vitals:   01/12/18 1108  BP: 122/86  Pulse: 92  Resp: 20  Temp: 97.8 F (36.6 C)  TempSrc: Oral  SpO2: 98%  Weight: 128 lb 1.6 oz (58.1 kg)  Height: 5\' 1"  (1.549 m)   Body mass index  is 24.2 kg/m. Physical Exam  Constitutional: She appears well-developed and well-nourished. No distress.  Eyes: Pupils are equal, round, and reactive to light. Conjunctivae are normal. Right eye exhibits no discharge. Left eye exhibits no discharge. No scleral icterus.  Neck: Normal range of motion.  Cardiovascular: Normal rate, regular rhythm, normal heart sounds and intact distal pulses. Exam reveals no gallop and no friction rub.  No murmur heard. Pulmonary/Chest: Effort normal and breath sounds normal. No respiratory distress. She has no wheezes. She has no rales.  Abdominal: Soft. Bowel sounds are normal. She exhibits no distension and no mass. There is no tenderness. There is no rebound and no guarding.  Genitourinary: Rectal exam shows internal hemorrhoid and tenderness. Rectal exam shows no mass.  Genitourinary Comments: Bright red blood noted.small hard stool also noted.   Musculoskeletal: She exhibits edema. She exhibits no tenderness.  Moves x 4 extremities unsteady gait uses wheelchair.bilateral lower extremities edema   Lymphadenopathy:    She has no cervical adenopathy.  Neurological:  Pleasantly confused at her baseline     Skin: Skin is warm and dry. No rash noted. No erythema. No pallor.  Psychiatric: She has a normal mood and affect. Her speech is normal and behavior is normal. Judgment and thought content normal. She exhibits abnormal recent memory.  Nursing note and vitals reviewed.   Labs reviewed: Recent Labs    05/15/17 09/08/17 09/25/17  NA 140  140 142 143  143  K 4.2  4.2 4.6 4.5  4.3  CL 104 104  --   CO2 30 31  --   BUN 25* 37* 36*  CREATININE 0.8  0.82 1.0 1.0  1.00  CALCIUM 9.3  9.3 9.3 9.4  MG 2.1  --   --    Recent Labs    05/15/17 09/08/17  AST 16  16 18   ALT 8  8 10   ALKPHOS 70  70 90  BILITOT 0.3  --   PROT 6.5  6.5 6.5  ALBUMIN 3.2  3.2 3.3   Recent Labs    05/15/17 09/02/17 09/08/17  WBC 9.6  9.6 7.4 9.6  HGB 13.0  13.0 14.1 13.2  HCT 39  38.8 42.4 40  MCV  --  92.6  --   PLT 332  --  263   Lab Results  Component Value Date   TSH 1.09 05/15/2017   TSH 1.09 05/15/2017   Lab Results  Component Value Date   HGBA1C 5.8 12/16/2016   Lab Results  Component Value Date   CHOL 174 12/16/2016   CHOL 174 12/16/2016   HDL 68 12/16/2016   HDL 68 12/16/2016   LDLCALC 80 12/16/2016   LDLCALC 80 12/16/2016   TRIG 159 12/16/2016    Significant Diagnostic Results in last 30 days:  No results found.  Assessment/Plan   Internal hemorrhoids Internal hemorrhoids with bright red blood tender with palpation on rectal exam unable to further check for impaction due to tenderness.small round hard to noted.start on hydrocortisone 25 mg suppository insert one per rectal.continue on senokot-S two tablet daily.add miralax 17 gm powder mix in 8 oz of fluid and drink daily for constipation.   Constipation  Had stool noted on rectal exam.continue on senokot-S two tablet daily.add miralax 17 gm powder mix in 8 oz of fluid and drink daily for constipation.Encourage oral intake and hydration.   Family/ staff Communication: Reviewed plan of care with patient and  facility Nurse.  Labs/tests ordered: None   Dinah  Shelva Majestic, NP

## 2018-01-13 DIAGNOSIS — Z9181 History of falling: Secondary | ICD-10-CM | POA: Diagnosis not present

## 2018-01-13 DIAGNOSIS — R609 Edema, unspecified: Secondary | ICD-10-CM | POA: Diagnosis not present

## 2018-01-13 DIAGNOSIS — Z7409 Other reduced mobility: Secondary | ICD-10-CM | POA: Diagnosis not present

## 2018-01-13 DIAGNOSIS — R4789 Other speech disturbances: Secondary | ICD-10-CM | POA: Diagnosis not present

## 2018-01-13 DIAGNOSIS — M6281 Muscle weakness (generalized): Secondary | ICD-10-CM | POA: Diagnosis not present

## 2018-01-13 DIAGNOSIS — R2681 Unsteadiness on feet: Secondary | ICD-10-CM | POA: Diagnosis not present

## 2018-01-14 ENCOUNTER — Non-Acute Institutional Stay (SKILLED_NURSING_FACILITY): Payer: Medicare Other | Admitting: Family Medicine

## 2018-01-14 ENCOUNTER — Encounter: Payer: Self-pay | Admitting: Family Medicine

## 2018-01-14 DIAGNOSIS — I739 Peripheral vascular disease, unspecified: Secondary | ICD-10-CM | POA: Diagnosis not present

## 2018-01-14 DIAGNOSIS — R269 Unspecified abnormalities of gait and mobility: Secondary | ICD-10-CM | POA: Diagnosis not present

## 2018-01-14 DIAGNOSIS — I10 Essential (primary) hypertension: Secondary | ICD-10-CM | POA: Diagnosis not present

## 2018-01-14 DIAGNOSIS — G250 Essential tremor: Secondary | ICD-10-CM | POA: Diagnosis not present

## 2018-01-14 NOTE — Progress Notes (Signed)
Provider:  Alain Honey, MD Location:  [place] Room Number: 32-A Place of Service:  SNF (31)  PCP: Ngetich, Nelda Bucks, NP Patient Care Team: Ngetich, Nelda Bucks, NP as PCP - General (Family Medicine) Jettie Booze, MD as Consulting Physician (Cardiology) Marchia Bond, MD as Consulting Physician (Orthopedic Surgery) Kathrynn Ducking, MD as Consulting Physician (Neurology) Brand Males, MD as Consulting Physician (Pulmonary Disease)  Extended Emergency Contact Information Primary Emergency Contact: [place] Mobile Phone: [phone] Relation: Son Secondary Emergency Contact: Ricardo [place] of [place] Phone: [phone] Mobile Phone: [phone] Relation: Daughter  Code Status: DNR Goals of Care: Advanced Directive information Advanced Directives 01/12/2018  Does Patient Have a Medical Advance Directive? Yes  Type of Paramedic of [place];Living will;Out of facility DNR (pink MOST or yellow form)  Does patient want to make changes to medical advance directive? No - Patient declined  Copy of [place] in Chart? Yes  Pre-existing out of facility DNR order (yellow form or pink MOST form) Yellow form placed in chart (order not valid for inpatient use);Pink MOST form placed in chart (order not valid for inpatient use)      Chief Complaint  Patient presents with  . Medical Management of Chronic Issues    f/u- HTN, constipation, GERD, PVD, alzheimer's dementia    HPI: Patient is a 84 y.o. female seen today for medical management of chronic problems including: Alzheimer's disease, peripheral vascular disease with edema, hypertension.  Patient denies any specific complaints today.  She has been seen recently for hemorrhoidal symptoms and cortisone suppositories have been ordered.  Administration is pending.  Review of nursing notes indicates appetite  and bowel function normal.  Weight is stable as his blood pressure.  Past Medical History:  Diagnosis Date  . Alzheimer's disease ([place]) 06/22/2015  . Anemia    iron deficient  . Anemia   . Anxiety   . Chronic airway obstruction, not elsewhere classified   . Chronic back pain   . Chronic cystitis   . Cystitis, chronic    Dr Gaynelle Arabian  . Degenerative arthritis   . Depression   . Disturbance of skin sensation   . Disturbance of skin sensation   . Dyslipidemia   . Dyspnea    Dr Chase Caller  . Esophageal reflux   . Essential and other specified forms of tremor 12/11/2012  . Foot drop, bilateral 12/21/2013  . Gait disorder   . GERD (gastroesophageal reflux disease)   . Hemorrhoids   . Hereditary and idiopathic peripheral neuropathy 10/11/2015  . Hernia   . History of cerebrovascular disease    Moderate level small vessel disease  . Hyperlipidemia    mild  . Hypertension   . Osteoarthritis    right knee injection per Dr Trudie Reed  . Osteoporosis   . Other abnormal clinical finding   . Pain in joint, upper arm   . Peripheral vascular disease ([place])   . Rheumatoid arthritis ([place])    Dr Trudie Reed  . Tobacco use disorder   . Tremor    and gait disorder--Dr Jannifer Franklin  . Unspecified vitamin D deficiency    Past Surgical History:  Procedure Laterality Date  . APPENDECTOMY    . BACK SURGERY Bilateral    X2  . Bladder resuspension procedure    . CATARACT EXTRACTION Bilateral   . FEMUR IM NAIL Right 02/22/2014   Procedure: INTRAMEDULLARY (IM) NAIL FEMORAL;  Surgeon: Vonna Kotyk  Llana Aliment, MD;  Location: [facility];  Service: Orthopedics;  Laterality: Right;  . INGUINAL HERNIA REPAIR Bilateral   . ORIF ANKLE FRACTURE Left 09/19/2016   Procedure: OPEN REDUCTION INTERNAL FIXATION (ORIF) ANKLE FRACTURE;  Surgeon: Vickey Huger, MD;  Location: [facility];  Service: Orthopedics;  Laterality: Left;  . TONSILLECTOMY      reports that she has quit smoking. She has never used smokeless tobacco. She reports that she  does not drink alcohol or use drugs. Social History   Socioeconomic History  . Marital status: Widowed    Spouse name: Not on file  . Number of children: 3  . Years of education: college  . Highest education level: Not on file  Occupational History  . Occupation: Producer, television/film/video: RETIRED  . Occupation: Retired   Scientific laboratory technician  . Financial resource strain: Not hard at all  . Food insecurity:    Worry: Never true    Inability: Never true  . Transportation needs:    Medical: No    Non-medical: No  Tobacco Use  . Smoking status: Former Research scientist (life sciences)  . Smokeless tobacco: Never Used  . Tobacco comment: "very few years"  Substance and Sexual Activity  . Alcohol use: No    Alcohol/week: 0.0 standard drinks  . Drug use: No  . Sexual activity: Never  Lifestyle  . Physical activity:    Days per week: 3 days    Minutes per session: 30 min  . Stress: Only a little  Relationships  . Social connections:    Talks on phone: Never    Gets together: More than three times a week    Attends religious service: Never    Active member of club or organization: No    Attends meetings of clubs or organizations: Never    Relationship status: Widowed  . Intimate partner violence:    Fear of current or ex partner: No    Emotionally abused: No    Physically abused: No    Forced sexual activity: No  Other Topics Concern  . Not on file  Social History Narrative   Patient lives at [facility].    Patient is widowed.    Patient has 3 children.    Patient is right handed.    Patient has 2 years of Business school.       Tobacco use, amount per day now: None      Past tobacco use, amount per day: Never      How many years did you use tobacco: Never      Alcohol use (drinks per week): None      Diet: N/A      Do you drink/eat things with caffeine? No      Marital status: Widowed             What year were you married?      Do you live in a house, apartment, assisted living,  condo, trailer? [place]      Is it one or more stories? 1      How many persons live in your home? N/A      Do you have any pets in your home? 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 N/A             How often? N/A      Do you have a living will? Yes  Do you have a DNR form? N/A           If not, do you want to discuss one? N/A      Do you have signed POA/HPOA forms? Yes               Functional Status Survey:    Family History  Problem Relation Age of Onset  . Heart Problems Mother   . Diabetes Father   . Heart attack Father   . Cancer Sister        Brain tumor  . Emphysema Brother     Health Maintenance  Topic Date Due  . TETANUS/TDAP  09/19/2026  . INFLUENZA VACCINE  Completed  . DEXA SCAN  Completed  . PNA vac Low Risk Adult  Completed    Allergies  Allergen Reactions  . Novocain [Procaine]     Passed  out  . Limbrel [Flavocoxid]     Dizziness   . Sertraline Anxiety    Outpatient Encounter Medications as of 01/14/2018  Medication Sig  . acetaminophen (TYLENOL) 650 MG CR tablet Take 650 mg by mouth 2 (two) times daily.   Marland Kitchen alum & mag hydroxide-simeth (MAALOX/MYLANTA) 200-200-20 MG/5ML suspension Take 30 mLs by mouth every 4 (four) hours as needed for indigestion or heartburn.  . Calcium Carbonate-Vitamin D (CALTRATE 600+D) 600-400 MG-UNIT per tablet Take 1 tablet by mouth daily.  Marland Kitchen donepezil (ARICEPT) 10 MG tablet Take 10 mg by mouth at bedtime.  . famotidine (PEPCID) 20 MG tablet Take 20 mg by mouth at bedtime.  Marland Kitchen Hyoscyamine Sulfate SL (LEVSIN/SL) 0.125 MG SUBL Place under the tongue every 12 (twelve) hours as needed (for cramps).   . LORazepam (ATIVAN) 0.5 MG tablet Take 0.5 mg by mouth 2 (two) times daily.   . memantine (NAMENDA) 10 MG tablet Take 10 mg by mouth 2 (two) times daily.  . metoprolol tartrate (LOPRESSOR) 25 MG tablet Take 12.5 mg by mouth 2 (two) times daily. Hold for SBP <110 or HR <60/min  .  mirtazapine (REMERON) 15 MG tablet Take 15 mg by mouth at bedtime.   . Misc Natural Products (OSTEO BI-FLEX TRIPLE STRENGTH) TABS Take 1 tablet by mouth daily.  . Multiple Vitamin (MULTIVITAMIN) tablet Take 1 tablet by mouth daily.  . Nutritional Supplements (RESOURCE 2.0) LIQD Take 120 mLs by mouth 3 (three) times daily.  Marland Kitchen omeprazole (PRILOSEC) 20 MG capsule Take 20 mg by mouth daily.  . ondansetron (ZOFRAN) 4 MG tablet Take 4 mg by mouth every 6 (six) hours as needed for nausea or vomiting.  . OXYGEN Inhale 2 L into the lungs as needed.  . polyethylene glycol (MIRALAX / GLYCOLAX) packet Take 17 g by mouth daily.  . potassium chloride (K-DUR,KLOR-CON) 10 MEQ tablet Take 10 mEq by mouth daily.  . QUEtiapine (SEROQUEL) 25 MG tablet Take 25 mg by mouth 2 (two) times daily. At 9AM and 1PM  . sennosides-docusate sodium (SENOKOT-S) 8.6-50 MG tablet Take 2 tablets by mouth daily.  Marland Kitchen torsemide (DEMADEX) 20 MG tablet Take 20 mg by mouth daily.  . [DISCONTINUED] ranitidine (ZANTAC) 150 MG tablet Take 150 mg by mouth at bedtime.    No facility-administered encounter medications on file as of 01/14/2018.     Review of Systems  Constitutional: Negative.   HENT: Positive for hearing loss.   Respiratory: Negative.   Cardiovascular: Negative.   Gastrointestinal: Negative.   Neurological: Positive for tremors.  Psychiatric/Behavioral: Positive for confusion.    Vitals:  01/14/18 1200  BP: (!) 142/80  Pulse: 90  Resp: 18  Temp: 98.9 F (37.2 C)  SpO2: 94%  Weight: 128 lb 1.6 oz (58.1 kg)  Height: 5\' 1"  (1.549 m)   Body mass index is 24.2 kg/m. Physical Exam  Constitutional: She appears well-developed and well-nourished.  HENT:  Head: Normocephalic.  Mouth/Throat: Oropharynx is clear and moist.  Eyes: Pupils are equal, round, and reactive to light.  Neck: Normal range of motion.  Cardiovascular: Normal rate and regular rhythm.  Pulmonary/Chest: Effort normal and breath sounds normal.    Abdominal: Soft. Bowel sounds are normal.  Musculoskeletal:  Uses wheelchair for ambulation.  There is a history of hip fracture but patient does not recall that  Neurological: She is alert.  Oriented to self  Skin:  She wears Geri sleeves for skin protection on her arms  Nursing note and vitals reviewed.   Labs reviewed: Basic Metabolic Panel: Recent Labs    05/15/17 09/08/17 09/25/17  NA 140  140 142 143  143  K 4.2  4.2 4.6 4.5  4.3  CL 104 104  --   CO2 30 31  --   BUN 25* 37* 36*  CREATININE 0.8  0.82 1.0 1.0  1.00  CALCIUM 9.3  9.3 9.3 9.4  MG 2.1  --   --    Liver Function Tests: Recent Labs    05/15/17 09/08/17  AST 16  16 18   ALT 8  8 10   ALKPHOS 70  70 90  BILITOT 0.3  --   PROT 6.5  6.5 6.5  ALBUMIN 3.2  3.2 3.3   No results for input(s): LIPASE, AMYLASE in the last 8760 hours. No results for input(s): AMMONIA in the last 8760 hours. CBC: Recent Labs    05/15/17 09/02/17 09/08/17  WBC 9.6  9.6 7.4 9.6  HGB 13.0  13.0 14.1 13.2  HCT 39  38.8 42.4 40  MCV  --  92.6  --   PLT 332  --  263   Cardiac Enzymes: No results for input(s): CKTOTAL, CKMB, CKMBINDEX, TROPONINI in the last 8760 hours. BNP: Invalid input(s): POCBNP Lab Results  Component Value Date   HGBA1C 5.8 12/16/2016   Lab Results  Component Value Date   TSH 1.09 05/15/2017   TSH 1.09 05/15/2017   Lab Results  Component Value Date   [id number] 694 12/21/2013   No results found for: FOLATE Lab Results  Component Value Date   IRON <10 (L) 07/14/2007   TIBC NOT CALC Not calculated due to Iron <10. 07/14/2007   FERRITIN 2 (L) 07/14/2007    Imaging and Procedures obtained prior to SNF admission: No results found.  Assessment/Plan 1. Essential hypertension Blood pressures have been controlled on metoprolol as well as torsemide  2. PVD (peripheral vascular disease) ([place]) Patient has history of edema.  Torsemide is effective  3. Benign essential tremor Tremor  was noted at exam.  Patient is on no medications which are indicated for control unless metoprolol may help a little  4. Gait disorder Ambulates with use of wheelchair.  Addendum to above note.  Patient is taking ranitidine for symptoms of reflux.  This medicine has been recalled and is no longer made so we will switch from ranitidine to famotidine   Family/ staff Communication:   Labs/tests ordered:  .smmsig

## 2018-01-15 DIAGNOSIS — R609 Edema, unspecified: Secondary | ICD-10-CM | POA: Diagnosis not present

## 2018-01-15 DIAGNOSIS — R4789 Other speech disturbances: Secondary | ICD-10-CM | POA: Diagnosis not present

## 2018-01-15 DIAGNOSIS — R2681 Unsteadiness on feet: Secondary | ICD-10-CM | POA: Diagnosis not present

## 2018-01-15 DIAGNOSIS — Z9181 History of falling: Secondary | ICD-10-CM | POA: Diagnosis not present

## 2018-01-15 DIAGNOSIS — M6281 Muscle weakness (generalized): Secondary | ICD-10-CM | POA: Diagnosis not present

## 2018-01-15 DIAGNOSIS — Z7409 Other reduced mobility: Secondary | ICD-10-CM | POA: Diagnosis not present

## 2018-01-16 DIAGNOSIS — R278 Other lack of coordination: Secondary | ICD-10-CM | POA: Diagnosis not present

## 2018-01-16 DIAGNOSIS — M4800 Spinal stenosis, site unspecified: Secondary | ICD-10-CM | POA: Diagnosis not present

## 2018-01-16 DIAGNOSIS — R2681 Unsteadiness on feet: Secondary | ICD-10-CM | POA: Diagnosis not present

## 2018-01-16 DIAGNOSIS — R262 Difficulty in walking, not elsewhere classified: Secondary | ICD-10-CM | POA: Diagnosis not present

## 2018-01-16 DIAGNOSIS — F329 Major depressive disorder, single episode, unspecified: Secondary | ICD-10-CM | POA: Diagnosis not present

## 2018-01-16 DIAGNOSIS — G250 Essential tremor: Secondary | ICD-10-CM | POA: Diagnosis not present

## 2018-01-16 DIAGNOSIS — M6281 Muscle weakness (generalized): Secondary | ICD-10-CM | POA: Diagnosis not present

## 2018-01-16 DIAGNOSIS — J984 Other disorders of lung: Secondary | ICD-10-CM | POA: Diagnosis not present

## 2018-01-16 DIAGNOSIS — H10023 Other mucopurulent conjunctivitis, bilateral: Secondary | ICD-10-CM | POA: Diagnosis not present

## 2018-01-16 DIAGNOSIS — I10 Essential (primary) hypertension: Secondary | ICD-10-CM | POA: Diagnosis not present

## 2018-01-16 DIAGNOSIS — G609 Hereditary and idiopathic neuropathy, unspecified: Secondary | ICD-10-CM | POA: Diagnosis not present

## 2018-01-16 DIAGNOSIS — K219 Gastro-esophageal reflux disease without esophagitis: Secondary | ICD-10-CM | POA: Diagnosis not present

## 2018-01-16 DIAGNOSIS — M25562 Pain in left knee: Secondary | ICD-10-CM | POA: Diagnosis not present

## 2018-01-16 DIAGNOSIS — I2789 Other specified pulmonary heart diseases: Secondary | ICD-10-CM | POA: Diagnosis not present

## 2018-01-16 DIAGNOSIS — G309 Alzheimer's disease, unspecified: Secondary | ICD-10-CM | POA: Diagnosis not present

## 2018-01-16 DIAGNOSIS — R4789 Other speech disturbances: Secondary | ICD-10-CM | POA: Diagnosis not present

## 2018-01-16 DIAGNOSIS — K648 Other hemorrhoids: Secondary | ICD-10-CM | POA: Diagnosis not present

## 2018-01-16 DIAGNOSIS — Z9181 History of falling: Secondary | ICD-10-CM | POA: Diagnosis not present

## 2018-01-16 DIAGNOSIS — K5900 Constipation, unspecified: Secondary | ICD-10-CM | POA: Diagnosis not present

## 2018-01-16 DIAGNOSIS — R609 Edema, unspecified: Secondary | ICD-10-CM | POA: Diagnosis not present

## 2018-01-16 DIAGNOSIS — Z7409 Other reduced mobility: Secondary | ICD-10-CM | POA: Diagnosis not present

## 2018-01-16 DIAGNOSIS — I679 Cerebrovascular disease, unspecified: Secondary | ICD-10-CM | POA: Diagnosis not present

## 2018-01-16 DIAGNOSIS — M21371 Foot drop, right foot: Secondary | ICD-10-CM | POA: Diagnosis not present

## 2018-01-16 DIAGNOSIS — M21372 Foot drop, left foot: Secondary | ICD-10-CM | POA: Diagnosis not present

## 2018-01-18 DIAGNOSIS — R2681 Unsteadiness on feet: Secondary | ICD-10-CM | POA: Diagnosis not present

## 2018-01-18 DIAGNOSIS — R4789 Other speech disturbances: Secondary | ICD-10-CM | POA: Diagnosis not present

## 2018-01-18 DIAGNOSIS — R262 Difficulty in walking, not elsewhere classified: Secondary | ICD-10-CM | POA: Diagnosis not present

## 2018-01-18 DIAGNOSIS — M6281 Muscle weakness (generalized): Secondary | ICD-10-CM | POA: Diagnosis not present

## 2018-01-18 DIAGNOSIS — Z9181 History of falling: Secondary | ICD-10-CM | POA: Diagnosis not present

## 2018-01-18 DIAGNOSIS — Z7409 Other reduced mobility: Secondary | ICD-10-CM | POA: Diagnosis not present

## 2018-01-19 DIAGNOSIS — M6281 Muscle weakness (generalized): Secondary | ICD-10-CM | POA: Diagnosis not present

## 2018-01-19 DIAGNOSIS — Z7409 Other reduced mobility: Secondary | ICD-10-CM | POA: Diagnosis not present

## 2018-01-19 DIAGNOSIS — R2681 Unsteadiness on feet: Secondary | ICD-10-CM | POA: Diagnosis not present

## 2018-01-19 DIAGNOSIS — R4789 Other speech disturbances: Secondary | ICD-10-CM | POA: Diagnosis not present

## 2018-01-19 DIAGNOSIS — Z9181 History of falling: Secondary | ICD-10-CM | POA: Diagnosis not present

## 2018-01-19 DIAGNOSIS — R262 Difficulty in walking, not elsewhere classified: Secondary | ICD-10-CM | POA: Diagnosis not present

## 2018-01-21 DIAGNOSIS — Z7409 Other reduced mobility: Secondary | ICD-10-CM | POA: Diagnosis not present

## 2018-01-21 DIAGNOSIS — M6281 Muscle weakness (generalized): Secondary | ICD-10-CM | POA: Diagnosis not present

## 2018-01-21 DIAGNOSIS — R2681 Unsteadiness on feet: Secondary | ICD-10-CM | POA: Diagnosis not present

## 2018-01-21 DIAGNOSIS — R4789 Other speech disturbances: Secondary | ICD-10-CM | POA: Diagnosis not present

## 2018-01-21 DIAGNOSIS — R262 Difficulty in walking, not elsewhere classified: Secondary | ICD-10-CM | POA: Diagnosis not present

## 2018-01-21 DIAGNOSIS — Z9181 History of falling: Secondary | ICD-10-CM | POA: Diagnosis not present

## 2018-01-22 DIAGNOSIS — M6281 Muscle weakness (generalized): Secondary | ICD-10-CM | POA: Diagnosis not present

## 2018-01-22 DIAGNOSIS — R262 Difficulty in walking, not elsewhere classified: Secondary | ICD-10-CM | POA: Diagnosis not present

## 2018-01-22 DIAGNOSIS — Z7409 Other reduced mobility: Secondary | ICD-10-CM | POA: Diagnosis not present

## 2018-01-22 DIAGNOSIS — R4789 Other speech disturbances: Secondary | ICD-10-CM | POA: Diagnosis not present

## 2018-01-22 DIAGNOSIS — Z9181 History of falling: Secondary | ICD-10-CM | POA: Diagnosis not present

## 2018-01-22 DIAGNOSIS — R2681 Unsteadiness on feet: Secondary | ICD-10-CM | POA: Diagnosis not present

## 2018-01-26 DIAGNOSIS — R4789 Other speech disturbances: Secondary | ICD-10-CM | POA: Diagnosis not present

## 2018-01-26 DIAGNOSIS — R262 Difficulty in walking, not elsewhere classified: Secondary | ICD-10-CM | POA: Diagnosis not present

## 2018-01-26 DIAGNOSIS — Z9181 History of falling: Secondary | ICD-10-CM | POA: Diagnosis not present

## 2018-01-26 DIAGNOSIS — Z7409 Other reduced mobility: Secondary | ICD-10-CM | POA: Diagnosis not present

## 2018-01-26 DIAGNOSIS — M6281 Muscle weakness (generalized): Secondary | ICD-10-CM | POA: Diagnosis not present

## 2018-01-26 DIAGNOSIS — R2681 Unsteadiness on feet: Secondary | ICD-10-CM | POA: Diagnosis not present

## 2018-01-28 DIAGNOSIS — Z7409 Other reduced mobility: Secondary | ICD-10-CM | POA: Diagnosis not present

## 2018-01-28 DIAGNOSIS — R262 Difficulty in walking, not elsewhere classified: Secondary | ICD-10-CM | POA: Diagnosis not present

## 2018-01-28 DIAGNOSIS — M6281 Muscle weakness (generalized): Secondary | ICD-10-CM | POA: Diagnosis not present

## 2018-01-28 DIAGNOSIS — Z9181 History of falling: Secondary | ICD-10-CM | POA: Diagnosis not present

## 2018-01-28 DIAGNOSIS — R2681 Unsteadiness on feet: Secondary | ICD-10-CM | POA: Diagnosis not present

## 2018-01-28 DIAGNOSIS — R4789 Other speech disturbances: Secondary | ICD-10-CM | POA: Diagnosis not present

## 2018-01-29 DIAGNOSIS — Z9181 History of falling: Secondary | ICD-10-CM | POA: Diagnosis not present

## 2018-01-29 DIAGNOSIS — M6281 Muscle weakness (generalized): Secondary | ICD-10-CM | POA: Diagnosis not present

## 2018-01-29 DIAGNOSIS — R262 Difficulty in walking, not elsewhere classified: Secondary | ICD-10-CM | POA: Diagnosis not present

## 2018-01-29 DIAGNOSIS — R4789 Other speech disturbances: Secondary | ICD-10-CM | POA: Diagnosis not present

## 2018-01-29 DIAGNOSIS — R2681 Unsteadiness on feet: Secondary | ICD-10-CM | POA: Diagnosis not present

## 2018-01-29 DIAGNOSIS — Z7409 Other reduced mobility: Secondary | ICD-10-CM | POA: Diagnosis not present

## 2018-01-30 DIAGNOSIS — Z7409 Other reduced mobility: Secondary | ICD-10-CM | POA: Diagnosis not present

## 2018-01-30 DIAGNOSIS — R2681 Unsteadiness on feet: Secondary | ICD-10-CM | POA: Diagnosis not present

## 2018-01-30 DIAGNOSIS — R4789 Other speech disturbances: Secondary | ICD-10-CM | POA: Diagnosis not present

## 2018-01-30 DIAGNOSIS — Z9181 History of falling: Secondary | ICD-10-CM | POA: Diagnosis not present

## 2018-01-30 DIAGNOSIS — R262 Difficulty in walking, not elsewhere classified: Secondary | ICD-10-CM | POA: Diagnosis not present

## 2018-01-30 DIAGNOSIS — M6281 Muscle weakness (generalized): Secondary | ICD-10-CM | POA: Diagnosis not present

## 2018-02-02 DIAGNOSIS — R2681 Unsteadiness on feet: Secondary | ICD-10-CM | POA: Diagnosis not present

## 2018-02-02 DIAGNOSIS — R4789 Other speech disturbances: Secondary | ICD-10-CM | POA: Diagnosis not present

## 2018-02-02 DIAGNOSIS — Z9181 History of falling: Secondary | ICD-10-CM | POA: Diagnosis not present

## 2018-02-02 DIAGNOSIS — R262 Difficulty in walking, not elsewhere classified: Secondary | ICD-10-CM | POA: Diagnosis not present

## 2018-02-02 DIAGNOSIS — Z7409 Other reduced mobility: Secondary | ICD-10-CM | POA: Diagnosis not present

## 2018-02-02 DIAGNOSIS — M6281 Muscle weakness (generalized): Secondary | ICD-10-CM | POA: Diagnosis not present

## 2018-02-04 DIAGNOSIS — Z7409 Other reduced mobility: Secondary | ICD-10-CM | POA: Diagnosis not present

## 2018-02-04 DIAGNOSIS — R262 Difficulty in walking, not elsewhere classified: Secondary | ICD-10-CM | POA: Diagnosis not present

## 2018-02-04 DIAGNOSIS — M6281 Muscle weakness (generalized): Secondary | ICD-10-CM | POA: Diagnosis not present

## 2018-02-04 DIAGNOSIS — Z9181 History of falling: Secondary | ICD-10-CM | POA: Diagnosis not present

## 2018-02-04 DIAGNOSIS — R2681 Unsteadiness on feet: Secondary | ICD-10-CM | POA: Diagnosis not present

## 2018-02-04 DIAGNOSIS — R4789 Other speech disturbances: Secondary | ICD-10-CM | POA: Diagnosis not present

## 2018-02-05 DIAGNOSIS — R262 Difficulty in walking, not elsewhere classified: Secondary | ICD-10-CM | POA: Diagnosis not present

## 2018-02-05 DIAGNOSIS — R2681 Unsteadiness on feet: Secondary | ICD-10-CM | POA: Diagnosis not present

## 2018-02-05 DIAGNOSIS — Z7409 Other reduced mobility: Secondary | ICD-10-CM | POA: Diagnosis not present

## 2018-02-05 DIAGNOSIS — R4789 Other speech disturbances: Secondary | ICD-10-CM | POA: Diagnosis not present

## 2018-02-05 DIAGNOSIS — Z9181 History of falling: Secondary | ICD-10-CM | POA: Diagnosis not present

## 2018-02-05 DIAGNOSIS — M6281 Muscle weakness (generalized): Secondary | ICD-10-CM | POA: Diagnosis not present

## 2018-02-09 DIAGNOSIS — Z7409 Other reduced mobility: Secondary | ICD-10-CM | POA: Diagnosis not present

## 2018-02-09 DIAGNOSIS — Z9181 History of falling: Secondary | ICD-10-CM | POA: Diagnosis not present

## 2018-02-09 DIAGNOSIS — R4789 Other speech disturbances: Secondary | ICD-10-CM | POA: Diagnosis not present

## 2018-02-09 DIAGNOSIS — M6281 Muscle weakness (generalized): Secondary | ICD-10-CM | POA: Diagnosis not present

## 2018-02-09 DIAGNOSIS — R2681 Unsteadiness on feet: Secondary | ICD-10-CM | POA: Diagnosis not present

## 2018-02-09 DIAGNOSIS — R262 Difficulty in walking, not elsewhere classified: Secondary | ICD-10-CM | POA: Diagnosis not present

## 2018-02-10 DIAGNOSIS — M6281 Muscle weakness (generalized): Secondary | ICD-10-CM | POA: Diagnosis not present

## 2018-02-10 DIAGNOSIS — Z7409 Other reduced mobility: Secondary | ICD-10-CM | POA: Diagnosis not present

## 2018-02-10 DIAGNOSIS — Z9181 History of falling: Secondary | ICD-10-CM | POA: Diagnosis not present

## 2018-02-10 DIAGNOSIS — R2681 Unsteadiness on feet: Secondary | ICD-10-CM | POA: Diagnosis not present

## 2018-02-10 DIAGNOSIS — R4789 Other speech disturbances: Secondary | ICD-10-CM | POA: Diagnosis not present

## 2018-02-10 DIAGNOSIS — R262 Difficulty in walking, not elsewhere classified: Secondary | ICD-10-CM | POA: Diagnosis not present

## 2018-02-11 ENCOUNTER — Non-Acute Institutional Stay (SKILLED_NURSING_FACILITY): Payer: Medicare Other | Admitting: Family

## 2018-02-11 ENCOUNTER — Encounter: Payer: Self-pay | Admitting: Family

## 2018-02-11 DIAGNOSIS — K5901 Slow transit constipation: Secondary | ICD-10-CM

## 2018-02-11 DIAGNOSIS — G309 Alzheimer's disease, unspecified: Secondary | ICD-10-CM

## 2018-02-11 DIAGNOSIS — R4789 Other speech disturbances: Secondary | ICD-10-CM | POA: Diagnosis not present

## 2018-02-11 DIAGNOSIS — F329 Major depressive disorder, single episode, unspecified: Secondary | ICD-10-CM | POA: Diagnosis not present

## 2018-02-11 DIAGNOSIS — F411 Generalized anxiety disorder: Secondary | ICD-10-CM | POA: Diagnosis not present

## 2018-02-11 DIAGNOSIS — F0281 Dementia in other diseases classified elsewhere with behavioral disturbance: Secondary | ICD-10-CM | POA: Diagnosis not present

## 2018-02-11 DIAGNOSIS — K219 Gastro-esophageal reflux disease without esophagitis: Secondary | ICD-10-CM

## 2018-02-11 DIAGNOSIS — R262 Difficulty in walking, not elsewhere classified: Secondary | ICD-10-CM | POA: Diagnosis not present

## 2018-02-11 DIAGNOSIS — I739 Peripheral vascular disease, unspecified: Secondary | ICD-10-CM | POA: Diagnosis not present

## 2018-02-11 DIAGNOSIS — I10 Essential (primary) hypertension: Secondary | ICD-10-CM

## 2018-02-11 DIAGNOSIS — Z9181 History of falling: Secondary | ICD-10-CM | POA: Diagnosis not present

## 2018-02-11 DIAGNOSIS — Z7409 Other reduced mobility: Secondary | ICD-10-CM | POA: Diagnosis not present

## 2018-02-11 DIAGNOSIS — M6281 Muscle weakness (generalized): Secondary | ICD-10-CM | POA: Diagnosis not present

## 2018-02-11 DIAGNOSIS — R2681 Unsteadiness on feet: Secondary | ICD-10-CM | POA: Diagnosis not present

## 2018-02-11 NOTE — Progress Notes (Signed)
Location:  [facility] Room Number: 32A Place of Service:  SNF (31) Provider: Dinah Ngetich FNP-C   Ngetich, Nelda Bucks, NP  Patient Care Team: Ngetich, Nelda Bucks, NP as PCP - General (Family Medicine) Jettie Booze, MD as Consulting Physician (Cardiology) Marchia Bond, MD as Consulting Physician (Orthopedic Surgery) Kathrynn Ducking, MD as Consulting Physician (Neurology) Brand Males, MD as Consulting Physician (Pulmonary Disease)  Extended Emergency Contact Information Primary Emergency Contact: [place] Phone: [phone] Relation: Son Secondary Emergency Contact: Ricardo Jericho [place] of [place] Phone: [phone] Mobile Phone: [phone] Relation: Daughter  Code Status:  DNR  Goals of care: Advanced Directive information Advanced Directives 02/11/2018  Does Patient Have a Medical Advance Directive? Yes  Type of Paramedic of [place];Living will;Out of facility DNR (pink MOST or yellow form)  Does patient want to make changes to medical advance directive? No - Patient declined  Copy of [place] in Chart? Yes - validated most recent copy scanned in chart (See row information)  Pre-existing out of facility DNR order (yellow form or pink MOST form) Yellow form placed in chart (order not valid for inpatient use);Pink MOST form placed in chart (order not valid for inpatient use)     Chief Complaint  Patient presents with  . Medical Management of Chronic Issues    Routine Visit    HPI:  Pt is a 84 y.o. female seen today [place] for medical management of chronic diseases.she has a medical history of Hypertension,GERD,Peripheral Neuropathy,Generalized anxiety,major depression among other conditions.she is seen in her room today.she denies any acute issues during visit.Nurse reports no new concerns.No recent fall episodes.chart review her  weight remains stable.she continues to say help! Help! Despite not needing any help.patient yells help even when staff is available or talking to her though this has improved not yelling loudly.       Past Medical History:  Diagnosis Date  . Alzheimer's disease ([place]) 06/22/2015  . Anemia    iron deficient  . Anemia   . Anxiety   . Chronic airway obstruction, not elsewhere classified   . Chronic back pain   . Chronic cystitis   . Cystitis, chronic    Dr Gaynelle Arabian  . Degenerative arthritis   . Depression   . Disturbance of skin sensation   . Disturbance of skin sensation   . Dyslipidemia   . Dyspnea    Dr Chase Caller  . Esophageal reflux   . Essential and other specified forms of tremor 12/11/2012  . Foot drop, bilateral 12/21/2013  . Gait disorder   . GERD (gastroesophageal reflux disease)   . Hemorrhoids   . Hereditary and idiopathic peripheral neuropathy 10/11/2015  . Hernia   . History of cerebrovascular disease    Moderate level small vessel disease  . Hyperlipidemia    mild  . Hypertension   . Osteoarthritis    right knee injection per Dr Trudie Reed  . Osteoporosis   . Other abnormal clinical finding   . Pain in joint, upper arm   . Peripheral vascular disease ([place])   . Rheumatoid arthritis ([place])    Dr Trudie Reed  . Tobacco use disorder   . Tremor    and gait disorder--Dr Jannifer Franklin  . Unspecified vitamin D deficiency    Past Surgical History:  Procedure Laterality Date  . APPENDECTOMY    . BACK SURGERY Bilateral    X2  . Bladder resuspension  procedure    . CATARACT EXTRACTION Bilateral   . FEMUR IM NAIL Right 02/22/2014   Procedure: INTRAMEDULLARY (IM) NAIL FEMORAL;  Surgeon: Johnny Bridge, MD;  Location: [facility];  Service: Orthopedics;  Laterality: Right;  . INGUINAL HERNIA REPAIR Bilateral   . ORIF ANKLE FRACTURE Left 09/19/2016   Procedure: OPEN REDUCTION INTERNAL FIXATION (ORIF) ANKLE FRACTURE;  Surgeon: Vickey Huger, MD;  Location: [facility];  Service: Orthopedics;   Laterality: Left;  . TONSILLECTOMY      Allergies  Allergen Reactions  . Novocain [Procaine]     Passed  out  . Limbrel [Flavocoxid]     Dizziness   . Sertraline Anxiety    Allergies as of 02/11/2018      Reactions   Novocain [procaine]    Passed  out   Limbrel [flavocoxid]    Dizziness   Sertraline Anxiety      Medication List        Accurate as of 02/11/18  5:17 PM. Always use your most recent med list.          acetaminophen 650 MG CR tablet Commonly known as:  TYLENOL Take 650 mg by mouth 2 (two) times daily.   alum & mag hydroxide-simeth 200-200-20 MG/5ML suspension Commonly known as:  MAALOX/MYLANTA Take 30 mLs by mouth every 4 (four) hours as needed for indigestion or heartburn.   CALTRATE 600+D 600-400 MG-UNIT tablet Generic drug:  Calcium Carbonate-Vitamin D Take 1 tablet by mouth daily.   donepezil 10 MG tablet Commonly known as:  ARICEPT Take 10 mg by mouth at bedtime.   famotidine 20 MG tablet Commonly known as:  PEPCID Take 20 mg by mouth at bedtime.   LEVSIN/SL 0.125 MG Subl Generic drug:  Hyoscyamine Sulfate SL Place under the tongue every 12 (twelve) hours as needed (for cramps).   LORazepam 0.5 MG tablet Commonly known as:  ATIVAN Take 0.5 mg by mouth 2 (two) times daily.   LUBRIDERM Lotn Apply topically. Apply Lubriderm every am and pm  to bilateral lower extremities   memantine 10 MG tablet Commonly known as:  NAMENDA Take 10 mg by mouth 2 (two) times daily.   metoprolol tartrate 25 MG tablet Commonly known as:  LOPRESSOR Take 12.5 mg by mouth 2 (two) times daily. Hold for SBP <110 or HR <60/min   mirtazapine 15 MG tablet Commonly known as:  REMERON Take 15 mg by mouth at bedtime.   multivitamin tablet Take 1 tablet by mouth daily.   omeprazole 20 MG capsule Commonly known as:  PRILOSEC Take 20 mg by mouth daily.   ondansetron 4 MG tablet Commonly known as:  ZOFRAN Take 4 mg by mouth every 6 (six) hours as needed  for nausea or vomiting.   OSTEO BI-FLEX TRIPLE STRENGTH Tabs Take 1 tablet by mouth daily.   OXYGEN Inhale 2 L into the lungs as needed.   polyethylene glycol packet Commonly known as:  MIRALAX / GLYCOLAX Take 17 g by mouth daily.   potassium chloride 10 MEQ tablet Commonly known as:  K-DUR,KLOR-CON Take 10 mEq by mouth daily.   QUEtiapine 25 MG tablet Commonly known as:  SEROQUEL Take 25 mg by mouth 2 (two) times daily. At 9AM and 1PM   RESOURCE 2.0 Liqd Take 120 mLs by mouth 3 (three) times daily.   sennosides-docusate sodium 8.6-50 MG tablet Commonly known as:  SENOKOT-S Take 2 tablets by mouth daily.   torsemide 20 MG tablet Commonly known as:  DEMADEX Take  20 mg by mouth daily.   zinc oxide 20 % ointment Apply 1 application topically as needed for irritation. Apply to buttocks       Review of Systems  Unable to perform ROS: Dementia (ROS limited due to cognitive impairment )  Constitutional: Negative for appetite change, chills, fever and unexpected weight change.  HENT: Positive for hearing loss. Negative for congestion, rhinorrhea, sinus pressure, sinus pain, sneezing, sore throat and trouble swallowing.   Eyes: Negative for discharge, redness, itching and visual disturbance.  Respiratory: Negative for cough, chest tightness, shortness of breath and wheezing.   Gastrointestinal: Negative for abdominal distention, abdominal pain, constipation, diarrhea and vomiting.       Complains " sick on my stomach"   Endocrine: Negative for cold intolerance, heat intolerance, polydipsia, polyphagia and polyuria.  Genitourinary: Negative for dysuria, flank pain, frequency and urgency.  Musculoskeletal: Positive for gait problem.  Skin: Negative for color change, pallor, rash and wound.  Neurological: Negative for dizziness, light-headedness and headaches.  Hematological: Does not bruise/bleed easily.  Psychiatric/Behavioral: Positive for confusion. Negative for agitation  and sleep disturbance. The patient is not nervous/anxious.     Immunization History  Administered Date(s) Administered  . Influenza Whole 01/16/2010  . Influenza-Unspecified 02/03/2006, 01/09/2007, 01/05/2013, 12/22/2014, 12/28/2015, 12/26/2016, 12/29/2017  . PPD Test 05/01/2004, 02/24/2014  . Pneumococcal Polysaccharide-23 01/21/2006  . Pneumococcal-Unspecified 12/19/1998  . Td 03/18/1998  . Tdap 09/18/2016   Pertinent  Health Maintenance Due  Topic Date Due  . INFLUENZA VACCINE  Completed  . DEXA SCAN  Completed  . PNA vac Low Risk Adult  Completed   Fall Risk  11/28/2017 10/28/2016 06/20/2015  Falls in the past year? No No No  Risk for fall due to : - - Impaired balance/gait    Vitals:   02/11/18 0923  BP: 114/74  Pulse: 73  Resp: 18  Temp: (!) 97.1 F (36.2 C)  TempSrc: Oral  SpO2: 96%  Weight: 127 lb 3.2 oz (57.7 kg)  Height: 5\' 1"  (1.549 m)   Body mass index is 24.03 kg/m. Physical Exam  Constitutional: She appears well-developed and well-nourished. No distress.  HENT:  Head: Normocephalic.  Right Ear: External ear normal.  Left Ear: External ear normal.  Mouth/Throat: Oropharynx is clear and moist. No oropharyngeal exudate.  Eyes: Pupils are equal, round, and reactive to light. Conjunctivae and EOM are normal. Right eye exhibits no discharge. Left eye exhibits no discharge. No scleral icterus.  Neck: Normal range of motion. No JVD present. No thyromegaly present.  Cardiovascular: Normal rate, regular rhythm, normal heart sounds and intact distal pulses. Exam reveals no gallop and no friction rub.  No murmur heard. Pulmonary/Chest: Effort normal and breath sounds normal. No respiratory distress. She has no wheezes. She has no rales.  Abdominal: Soft. Bowel sounds are normal. She exhibits no distension and no mass. There is no tenderness. There is no rebound and no guarding.  Genitourinary:  Genitourinary Comments: Incontinent   Musculoskeletal: She exhibits no  tenderness.  Moves x 4 extremities.unsteady gait self propels on wheelchair.bilateral lower extremities trace edema knee high ted hose in place.   Lymphadenopathy:    She has no cervical adenopathy.  Neurological: Gait abnormal.  Pleasantly confused at her baseline   Skin: Skin is warm and dry. Capillary refill takes 2 to 3 seconds. No rash noted. No erythema. No pallor.  Psychiatric: She has a normal mood and affect. Her speech is normal and behavior is normal. Judgment and thought content normal.  Cognition and memory are impaired.  Nursing note and vitals reviewed.   Labs reviewed: Recent Labs    05/15/17 09/08/17 09/25/17  NA 140  140 142 143  143  K 4.2  4.2 4.6 4.5  4.3  CL 104 104  --   CO2 30 31  --   BUN 25* 37* 36*  CREATININE 0.8  0.82 1.0 1.0  1.00  CALCIUM 9.3  9.3 9.3 9.4  MG 2.1  --   --    Recent Labs    05/15/17 09/08/17  AST 16  16 18   ALT 8  8 10   ALKPHOS 70  70 90  BILITOT 0.3  --   PROT 6.5  6.5 6.5  ALBUMIN 3.2  3.2 3.3   Recent Labs    05/15/17 09/02/17 09/08/17  WBC 9.6  9.6 7.4 9.6  HGB 13.0  13.0 14.1 13.2  HCT 39  38.8 42.4 40  MCV  --  92.6  --   PLT 332  --  263   Lab Results  Component Value Date   TSH 1.09 05/15/2017   TSH 1.09 05/15/2017   Lab Results  Component Value Date   HGBA1C 5.8 12/16/2016   Lab Results  Component Value Date   CHOL 174 12/16/2016   CHOL 174 12/16/2016   HDL 68 12/16/2016   HDL 68 12/16/2016   LDLCALC 80 12/16/2016   LDLCALC 80 12/16/2016   TRIG 159 12/16/2016    Significant Diagnostic Results in last 30 days:  No results found.  Assessment/Plan 1. Essential hypertension B/p reviewed stable.continue on metoprolol tartrate 12.5 mg tablet twice daily.   2. Gastroesophageal reflux disease without esophagitis Zantac discontinued in previous visit by MD due to recall and famotidine started.systoms stable.continue on current regimen.   3. PVD (peripheral vascular disease)  ([facility]) Bilateral lower extremities trace edema.continue on ted hose on in am and off at bedtime.No ulceration noted.Continue skin care.   4. Major depression, chronic Mood stable.continue to monitor for mood changes.  5. GAD (generalized anxiety disorder) Continue on ativan.  6. Alzheimer's dementia with behavioral disturbance, unspecified timing of dementia onset ([place]) No new behavioral issues reported.continue on Seroquel,Ativan,Remeron ,Namenda and Aricept.continue with supportive care.   7. Slow transit constipation Current regimen effective.continue to encourage oral intake and hydration.  Family/ staff Communication: Reviewed plan of care with patient and facility Nurse.   Labs/tests ordered: None   Dinah C Ngetich, NP

## 2018-02-17 DIAGNOSIS — Z7409 Other reduced mobility: Secondary | ICD-10-CM | POA: Diagnosis not present

## 2018-02-17 DIAGNOSIS — I679 Cerebrovascular disease, unspecified: Secondary | ICD-10-CM | POA: Diagnosis not present

## 2018-02-17 DIAGNOSIS — I10 Essential (primary) hypertension: Secondary | ICD-10-CM | POA: Diagnosis not present

## 2018-02-17 DIAGNOSIS — G309 Alzheimer's disease, unspecified: Secondary | ICD-10-CM | POA: Diagnosis not present

## 2018-02-17 DIAGNOSIS — J984 Other disorders of lung: Secondary | ICD-10-CM | POA: Diagnosis not present

## 2018-02-17 DIAGNOSIS — H10023 Other mucopurulent conjunctivitis, bilateral: Secondary | ICD-10-CM | POA: Diagnosis not present

## 2018-02-17 DIAGNOSIS — M4800 Spinal stenosis, site unspecified: Secondary | ICD-10-CM | POA: Diagnosis not present

## 2018-02-17 DIAGNOSIS — K5900 Constipation, unspecified: Secondary | ICD-10-CM | POA: Diagnosis not present

## 2018-02-17 DIAGNOSIS — M25562 Pain in left knee: Secondary | ICD-10-CM | POA: Diagnosis not present

## 2018-02-17 DIAGNOSIS — R278 Other lack of coordination: Secondary | ICD-10-CM | POA: Diagnosis not present

## 2018-02-17 DIAGNOSIS — Z9181 History of falling: Secondary | ICD-10-CM | POA: Diagnosis not present

## 2018-02-17 DIAGNOSIS — K648 Other hemorrhoids: Secondary | ICD-10-CM | POA: Diagnosis not present

## 2018-02-17 DIAGNOSIS — G609 Hereditary and idiopathic neuropathy, unspecified: Secondary | ICD-10-CM | POA: Diagnosis not present

## 2018-02-17 DIAGNOSIS — F329 Major depressive disorder, single episode, unspecified: Secondary | ICD-10-CM | POA: Diagnosis not present

## 2018-02-17 DIAGNOSIS — K219 Gastro-esophageal reflux disease without esophagitis: Secondary | ICD-10-CM | POA: Diagnosis not present

## 2018-02-17 DIAGNOSIS — R262 Difficulty in walking, not elsewhere classified: Secondary | ICD-10-CM | POA: Diagnosis not present

## 2018-02-17 DIAGNOSIS — M6281 Muscle weakness (generalized): Secondary | ICD-10-CM | POA: Diagnosis not present

## 2018-02-17 DIAGNOSIS — M21372 Foot drop, left foot: Secondary | ICD-10-CM | POA: Diagnosis not present

## 2018-02-17 DIAGNOSIS — G250 Essential tremor: Secondary | ICD-10-CM | POA: Diagnosis not present

## 2018-02-17 DIAGNOSIS — I2789 Other specified pulmonary heart diseases: Secondary | ICD-10-CM | POA: Diagnosis not present

## 2018-02-17 DIAGNOSIS — M21371 Foot drop, right foot: Secondary | ICD-10-CM | POA: Diagnosis not present

## 2018-02-17 DIAGNOSIS — R4789 Other speech disturbances: Secondary | ICD-10-CM | POA: Diagnosis not present

## 2018-02-17 DIAGNOSIS — R609 Edema, unspecified: Secondary | ICD-10-CM | POA: Diagnosis not present

## 2018-02-17 DIAGNOSIS — R2681 Unsteadiness on feet: Secondary | ICD-10-CM | POA: Diagnosis not present

## 2018-02-18 DIAGNOSIS — M6281 Muscle weakness (generalized): Secondary | ICD-10-CM | POA: Diagnosis not present

## 2018-02-18 DIAGNOSIS — R4789 Other speech disturbances: Secondary | ICD-10-CM | POA: Diagnosis not present

## 2018-02-18 DIAGNOSIS — R2681 Unsteadiness on feet: Secondary | ICD-10-CM | POA: Diagnosis not present

## 2018-02-18 DIAGNOSIS — Z9181 History of falling: Secondary | ICD-10-CM | POA: Diagnosis not present

## 2018-02-18 DIAGNOSIS — Z7409 Other reduced mobility: Secondary | ICD-10-CM | POA: Diagnosis not present

## 2018-02-18 DIAGNOSIS — R262 Difficulty in walking, not elsewhere classified: Secondary | ICD-10-CM | POA: Diagnosis not present

## 2018-02-19 DIAGNOSIS — R262 Difficulty in walking, not elsewhere classified: Secondary | ICD-10-CM | POA: Diagnosis not present

## 2018-02-19 DIAGNOSIS — Z7409 Other reduced mobility: Secondary | ICD-10-CM | POA: Diagnosis not present

## 2018-02-19 DIAGNOSIS — R4789 Other speech disturbances: Secondary | ICD-10-CM | POA: Diagnosis not present

## 2018-02-19 DIAGNOSIS — Z9181 History of falling: Secondary | ICD-10-CM | POA: Diagnosis not present

## 2018-02-19 DIAGNOSIS — M6281 Muscle weakness (generalized): Secondary | ICD-10-CM | POA: Diagnosis not present

## 2018-02-19 DIAGNOSIS — R2681 Unsteadiness on feet: Secondary | ICD-10-CM | POA: Diagnosis not present

## 2018-02-23 ENCOUNTER — Encounter: Payer: Self-pay | Admitting: Family Medicine

## 2018-02-23 DIAGNOSIS — Z9181 History of falling: Secondary | ICD-10-CM | POA: Diagnosis not present

## 2018-02-23 DIAGNOSIS — Z7409 Other reduced mobility: Secondary | ICD-10-CM | POA: Diagnosis not present

## 2018-02-23 DIAGNOSIS — R262 Difficulty in walking, not elsewhere classified: Secondary | ICD-10-CM | POA: Diagnosis not present

## 2018-02-23 DIAGNOSIS — R2681 Unsteadiness on feet: Secondary | ICD-10-CM | POA: Diagnosis not present

## 2018-02-23 DIAGNOSIS — R4789 Other speech disturbances: Secondary | ICD-10-CM | POA: Diagnosis not present

## 2018-02-23 DIAGNOSIS — M6281 Muscle weakness (generalized): Secondary | ICD-10-CM | POA: Diagnosis not present

## 2018-02-26 DIAGNOSIS — R4789 Other speech disturbances: Secondary | ICD-10-CM | POA: Diagnosis not present

## 2018-02-26 DIAGNOSIS — R2681 Unsteadiness on feet: Secondary | ICD-10-CM | POA: Diagnosis not present

## 2018-02-26 DIAGNOSIS — Z7409 Other reduced mobility: Secondary | ICD-10-CM | POA: Diagnosis not present

## 2018-02-26 DIAGNOSIS — R262 Difficulty in walking, not elsewhere classified: Secondary | ICD-10-CM | POA: Diagnosis not present

## 2018-02-26 DIAGNOSIS — Z9181 History of falling: Secondary | ICD-10-CM | POA: Diagnosis not present

## 2018-02-26 DIAGNOSIS — M6281 Muscle weakness (generalized): Secondary | ICD-10-CM | POA: Diagnosis not present

## 2018-02-27 DIAGNOSIS — Z9181 History of falling: Secondary | ICD-10-CM | POA: Diagnosis not present

## 2018-02-27 DIAGNOSIS — Z7409 Other reduced mobility: Secondary | ICD-10-CM | POA: Diagnosis not present

## 2018-02-27 DIAGNOSIS — R4789 Other speech disturbances: Secondary | ICD-10-CM | POA: Diagnosis not present

## 2018-02-27 DIAGNOSIS — M6281 Muscle weakness (generalized): Secondary | ICD-10-CM | POA: Diagnosis not present

## 2018-02-27 DIAGNOSIS — R262 Difficulty in walking, not elsewhere classified: Secondary | ICD-10-CM | POA: Diagnosis not present

## 2018-02-27 DIAGNOSIS — R2681 Unsteadiness on feet: Secondary | ICD-10-CM | POA: Diagnosis not present

## 2018-03-02 DIAGNOSIS — R2681 Unsteadiness on feet: Secondary | ICD-10-CM | POA: Diagnosis not present

## 2018-03-02 DIAGNOSIS — M6281 Muscle weakness (generalized): Secondary | ICD-10-CM | POA: Diagnosis not present

## 2018-03-02 DIAGNOSIS — R262 Difficulty in walking, not elsewhere classified: Secondary | ICD-10-CM | POA: Diagnosis not present

## 2018-03-02 DIAGNOSIS — Z7409 Other reduced mobility: Secondary | ICD-10-CM | POA: Diagnosis not present

## 2018-03-02 DIAGNOSIS — Z9181 History of falling: Secondary | ICD-10-CM | POA: Diagnosis not present

## 2018-03-02 DIAGNOSIS — R4789 Other speech disturbances: Secondary | ICD-10-CM | POA: Diagnosis not present

## 2018-03-04 DIAGNOSIS — Z7409 Other reduced mobility: Secondary | ICD-10-CM | POA: Diagnosis not present

## 2018-03-04 DIAGNOSIS — Z9181 History of falling: Secondary | ICD-10-CM | POA: Diagnosis not present

## 2018-03-04 DIAGNOSIS — R4789 Other speech disturbances: Secondary | ICD-10-CM | POA: Diagnosis not present

## 2018-03-04 DIAGNOSIS — R2681 Unsteadiness on feet: Secondary | ICD-10-CM | POA: Diagnosis not present

## 2018-03-04 DIAGNOSIS — M6281 Muscle weakness (generalized): Secondary | ICD-10-CM | POA: Diagnosis not present

## 2018-03-04 DIAGNOSIS — R262 Difficulty in walking, not elsewhere classified: Secondary | ICD-10-CM | POA: Diagnosis not present

## 2018-03-05 DIAGNOSIS — R4789 Other speech disturbances: Secondary | ICD-10-CM | POA: Diagnosis not present

## 2018-03-05 DIAGNOSIS — R262 Difficulty in walking, not elsewhere classified: Secondary | ICD-10-CM | POA: Diagnosis not present

## 2018-03-05 DIAGNOSIS — M6281 Muscle weakness (generalized): Secondary | ICD-10-CM | POA: Diagnosis not present

## 2018-03-05 DIAGNOSIS — Z9181 History of falling: Secondary | ICD-10-CM | POA: Diagnosis not present

## 2018-03-05 DIAGNOSIS — R2681 Unsteadiness on feet: Secondary | ICD-10-CM | POA: Diagnosis not present

## 2018-03-05 DIAGNOSIS — Z7409 Other reduced mobility: Secondary | ICD-10-CM | POA: Diagnosis not present

## 2018-03-12 ENCOUNTER — Encounter: Payer: Self-pay | Admitting: Family Medicine

## 2018-03-12 ENCOUNTER — Non-Acute Institutional Stay (SKILLED_NURSING_FACILITY): Payer: Medicare Other | Admitting: Family Medicine

## 2018-03-12 DIAGNOSIS — I10 Essential (primary) hypertension: Secondary | ICD-10-CM | POA: Diagnosis not present

## 2018-03-12 DIAGNOSIS — I739 Peripheral vascular disease, unspecified: Secondary | ICD-10-CM

## 2018-03-12 DIAGNOSIS — G309 Alzheimer's disease, unspecified: Secondary | ICD-10-CM

## 2018-03-12 DIAGNOSIS — R269 Unspecified abnormalities of gait and mobility: Secondary | ICD-10-CM

## 2018-03-12 DIAGNOSIS — F0281 Dementia in other diseases classified elsewhere with behavioral disturbance: Secondary | ICD-10-CM | POA: Diagnosis not present

## 2018-03-12 NOTE — Progress Notes (Signed)
Provider:  Alain Honey, MD Location:  [facility] Room Number: 32-A Place of Service:  SNF (31)  PCP: Ngetich, Nelda Bucks, NP Patient Care Team: Ngetich, Nelda Bucks, NP as PCP - General (Family Medicine) Brooke Booze, MD as Consulting Physician (Cardiology) Marchia Bond, MD as Consulting Physician (Orthopedic Surgery) Kathrynn Ducking, MD as Consulting Physician (Neurology) Brand Males, MD as Consulting Physician (Pulmonary Disease)  Extended Emergency Contact Information Primary Emergency Contact: [name] Mobile Phone: [phone] Relation: Son Secondary Emergency Contact: [name] States of [place] Phone: [phone] Mobile Phone: [phone] Relation: Daughter  Code Status: DNR Goals of Care: Advanced Directive information Advanced Directives 03/12/2018  Does Patient Have a Medical Advance Directive? Yes  Type of Advance Directive Out of facility DNR (pink MOST or yellow form);Living will  Does patient want to make changes to medical advance directive? No - Patient declined  Copy of [place] in Chart? -  Pre-existing out of facility DNR order (yellow form or pink MOST form) Yellow form placed in chart (order not valid for inpatient use);Pink MOST form placed in chart (order not valid for inpatient use)      No chief complaint on file.   HPI: Patient is a 84 y.o. female seen today for medical management of chronic problems including: Alzheimer's dementia, peripheral vascular disease, tension, peripheral neuropathy.  Patient sitting in the living room complaints today he tells me her appetite sleep and bladder function all okay.  There are no issues nurses notes are consulted.  Past Medical History:  Diagnosis Date  . Alzheimer's disease ([place]) 06/22/2015  . Anemia    iron deficient  . Anemia   . Anxiety   . Chronic airway obstruction, not elsewhere classified    . Chronic back pain   . Chronic cystitis   . Cystitis, chronic    Dr Gaynelle Arabian  . Degenerative arthritis   . Depression   . Disturbance of skin sensation   . Disturbance of skin sensation   . Dyslipidemia   . Dyspnea    Dr Chase Caller  . Esophageal reflux   . Essential and other specified forms of tremor 12/11/2012  . Foot drop, bilateral 12/21/2013  . Gait disorder   . GERD (gastroesophageal reflux disease)   . Hemorrhoids   . Hereditary and idiopathic peripheral neuropathy 10/11/2015  . Hernia   . History of cerebrovascular disease    Moderate level small vessel disease  . Hyperlipidemia    mild  . Hypertension   . Osteoarthritis    right knee injection per Dr Trudie Reed  . Osteoporosis   . Other abnormal clinical finding   . Pain in joint, upper arm   . Peripheral vascular disease ([place])   . Rheumatoid arthritis ([place])    Dr Trudie Reed  . Tobacco use disorder   . Tremor    and gait disorder--Dr Jannifer Franklin  . Unspecified vitamin D deficiency    Past Surgical History:  Procedure Laterality Date  . APPENDECTOMY    . BACK SURGERY Bilateral    X2  . Bladder resuspension procedure    . CATARACT EXTRACTION Bilateral   . FEMUR IM NAIL Right 02/22/2014   Procedure: INTRAMEDULLARY (IM) NAIL FEMORAL;  Surgeon: Brooke Bridge, MD;  Location: [facility];  Service: Orthopedics;  Laterality: Right;  . INGUINAL HERNIA REPAIR Bilateral   . ORIF ANKLE FRACTURE Left 09/19/2016   Procedure: OPEN REDUCTION INTERNAL FIXATION (ORIF) ANKLE FRACTURE;  Surgeon: Brooke Huger, MD;  Location: [facility];  Service: Orthopedics;  Laterality: Left;  . TONSILLECTOMY      reports that she has quit smoking. She has never used smokeless tobacco. She reports that she does not drink alcohol or use drugs. Social History   Socioeconomic History  . Marital status: Widowed    Spouse name: Not on file  . Number of children: 3  . Years of education: college  . Highest education level: Not on file  Occupational History  .  Occupation: Producer, television/film/video: RETIRED  . Occupation: Retired   Scientific laboratory technician  . Financial resource strain: Not hard at all  . Food insecurity:    Worry: Never true    Inability: Never true  . Transportation needs:    Medical: No    Non-medical: No  Tobacco Use  . Smoking status: Former Research scientist (life sciences)  . Smokeless tobacco: Never Used  . Tobacco comment: "very few years"  Substance and Sexual Activity  . Alcohol use: No    Alcohol/week: 0.0 standard drinks  . Drug use: No  . Sexual activity: Never  Lifestyle  . Physical activity:    Days per week: 3 days    Minutes per session: 30 min  . Stress: Only a little  Relationships  . Social connections:    Talks on phone: Never    Gets together: More than three times a week    Attends religious service: Never    Active member of club or organization: No    Attends meetings of clubs or organizations: Never    Relationship status: Widowed  . Intimate partner violence:    Fear of current or ex partner: No    Emotionally abused: No    Physically abused: No    Forced sexual activity: No  Other Topics Concern  . Not on file  Social History Narrative   Patient lives at [facility].    Patient is widowed.    Patient has 3 children.    Patient is right handed.    Patient has 2 years of Business school.       Tobacco use, amount per day now: None      Past tobacco use, amount per day: Never      How many years did you use tobacco: Never      Alcohol use (drinks per week): None      Diet: N/A      Do you drink/eat things with caffeine? No      Marital status: Widowed             What year were you married?      Do you live in a house, apartment, assisted living, condo, trailer? [place]      Is it one or more stories? 1      How many persons live in your home? N/A      Do you have any pets in your home? 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 N/A             How often? N/A        Do you have a living will? Yes      Do you have a DNR form? N/A           If not, do you want to discuss one? N/A      Do you  have signed POA/HPOA forms? Yes               Functional Status Survey:    Family History  Problem Relation Age of Onset  . Heart Problems Mother   . Diabetes Father   . Heart attack Father   . Cancer Sister        Brain tumor  . Emphysema Brother     Health Maintenance  Topic Date Due  . TETANUS/TDAP  09/19/2026  . INFLUENZA VACCINE  Completed  . DEXA SCAN  Completed  . PNA vac Low Risk Adult  Completed    Allergies  Allergen Reactions  . Novocain [Procaine]     Passed  out  . Limbrel [Flavocoxid]     Dizziness   . Sertraline Anxiety    Outpatient Encounter Medications as of 03/12/2018  Medication Sig  . acetaminophen (TYLENOL) 650 MG CR tablet Take 650 mg by mouth 2 (two) times daily.   Marland Kitchen alum & mag hydroxide-simeth (MAALOX/MYLANTA) 200-200-20 MG/5ML suspension Take 30 mLs by mouth every 4 (four) hours as needed for indigestion or heartburn.  . Calcium Carbonate-Vitamin D (CALTRATE 600+D) 600-400 MG-UNIT per tablet Take 1 tablet by mouth daily.  Marland Kitchen donepezil (ARICEPT) 10 MG tablet Take 10 mg by mouth at bedtime.  . famotidine (PEPCID) 20 MG tablet Take 20 mg by mouth at bedtime.  Marland Kitchen Hyoscyamine Sulfate SL (LEVSIN/SL) 0.125 MG SUBL Place under the tongue every 12 (twelve) hours as needed (for cramps).   . LORazepam (ATIVAN) 0.5 MG tablet Take 0.5 mg by mouth 2 (two) times daily.   . memantine (NAMENDA) 10 MG tablet Take 10 mg by mouth 2 (two) times daily.  . metoprolol tartrate (LOPRESSOR) 25 MG tablet Take 12.5 mg by mouth 2 (two) times daily. Hold for SBP <110 or HR <60/min  . mirtazapine (REMERON) 15 MG tablet Take 15 mg by mouth at bedtime.   . Misc Natural Products (OSTEO BI-FLEX TRIPLE STRENGTH) TABS Take 1 tablet by mouth daily.  . Multiple Vitamin (MULTIVITAMIN) tablet Take 1 tablet by mouth daily.  . Nutritional Supplements  (RESOURCE 2.0) LIQD Take 120 mLs by mouth 3 (three) times daily.  Marland Kitchen omeprazole (PRILOSEC) 20 MG capsule Take 20 mg by mouth daily.  . ondansetron (ZOFRAN) 4 MG tablet Take 4 mg by mouth every 6 (six) hours as needed for nausea or vomiting.  . polyethylene glycol (MIRALAX / GLYCOLAX) packet Take 17 g by mouth daily.  . potassium chloride (K-DUR,KLOR-CON) 10 MEQ tablet Take 10 mEq by mouth daily.  . QUEtiapine (SEROQUEL) 25 MG tablet Take 25 mg by mouth 2 (two) times daily. At 9AM and 1PM  . sennosides-docusate sodium (SENOKOT-S) 8.6-50 MG tablet Take 2 tablets by mouth daily.  Marland Kitchen torsemide (DEMADEX) 20 MG tablet Take 20 mg by mouth daily.  Marland Kitchen zinc oxide 20 % ointment Apply 1 application topically as needed for irritation. Apply to buttocks  . [DISCONTINUED] OXYGEN Inhale 2 L into the lungs as needed.   No facility-administered encounter medications on file as of 03/12/2018.     Review of Systems  Constitutional: Negative.   HENT: Positive for hearing loss.   Respiratory: Negative.   Cardiovascular: Negative.   Gastrointestinal: Positive for constipation.  Musculoskeletal: Positive for gait problem.  Psychiatric/Behavioral: Positive for confusion.    Vitals:   03/12/18 1028  BP: (!) 148/82  Pulse: 90  Resp: 18  Temp: 97.6 F (36.4 C)  SpO2: 95%  Weight: 125 lb 3.2 oz (  56.8 kg)  Height: 5\' 1"  (1.549 m)   Body mass index is 23.66 kg/m. Physical Exam Constitutional:      Appearance: Normal appearance.  HENT:     Head: Normocephalic.     Mouth/Throat:     Mouth: Mucous membranes are moist.  Cardiovascular:     Rate and Rhythm: Normal rate and regular rhythm.     Heart sounds: Normal heart sounds.  Pulmonary:     Effort: Pulmonary effort is normal.     Breath sounds: Normal breath sounds.  Abdominal:     General: Bowel sounds are normal.     Palpations: Abdomen is soft.  Neurological:     General: No focal deficit present.     Mental Status: She is alert.     Comments:  Oriented to person  Psychiatric:        Mood and Affect: Mood normal.     Comments: Patient still yells out at times     Labs reviewed: Basic Metabolic Panel: Recent Labs    05/15/17 09/08/17 09/25/17  NA 140  140 142 143  143  K 4.2  4.2 4.6 4.5  4.3  CL 104 104  --   CO2 30 31  --   BUN 25* 37* 36*  CREATININE 0.8  0.82 1.0 1.0  1.00  CALCIUM 9.3  9.3 9.3 9.4  MG 2.1  --   --    Liver Function Tests: Recent Labs    05/15/17 09/08/17  AST 16  16 18   ALT 8  8 10   ALKPHOS 70  70 90  BILITOT 0.3  --   PROT 6.5  6.5 6.5  ALBUMIN 3.2  3.2 3.3   No results for input(s): LIPASE, AMYLASE in the last 8760 hours. No results for input(s): AMMONIA in the last 8760 hours. CBC: Recent Labs    05/15/17 09/02/17 09/08/17  WBC 9.6  9.6 7.4 9.6  HGB 13.0  13.0 14.1 13.2  HCT 39  38.8 42.4 40  MCV  --  92.6  --   PLT 332  --  263   Cardiac Enzymes: No results for input(s): CKTOTAL, CKMB, CKMBINDEX, TROPONINI in the last 8760 hours. BNP: Invalid input(s): POCBNP Lab Results  Component Value Date   HGBA1C 5.8 12/16/2016   Lab Results  Component Value Date   TSH 1.09 05/15/2017   TSH 1.09 05/15/2017   Lab Results  Component Value Date   ZESPQZRA07 622 12/21/2013   No results found for: FOLATE Lab Results  Component Value Date   IRON <10 (L) 07/14/2007   TIBC NOT CALC Not calculated due to Iron <10. 07/14/2007   FERRITIN 2 (L) 07/14/2007    Imaging and Procedures obtained prior to SNF admission: No results found.  Assessment/Plan 1. Essential hypertension Blo prolonged is od pressure today 148/80 2 weeks  2. PVD (peripheral vascular disease) ([place]) Also is for edema  3. Alzheimer's dementia with behavioral disturbance, unspecified timing of dementia onset ([place]) Are some behaviors such as yelling out as noted above.  Medicines for memory and behaviors including Aricept, Namenda, Seroquel  4. Gait disorder No recent falls.  Ambulates in  wheelchair   Family/ staff Communication: As communicated to nursing staff  Labs/tests ordered:  Lillette Boxer. Sabra Heck, [place] [address] [place], [place] Office [phone]

## 2018-04-15 ENCOUNTER — Encounter: Payer: Self-pay | Admitting: Internal Medicine

## 2018-04-15 ENCOUNTER — Other Ambulatory Visit: Payer: Self-pay

## 2018-04-15 MED ORDER — LORAZEPAM 0.5 MG PO TABS: 0.5000 mg | ORAL_TABLET | Freq: Two times a day (BID) | ORAL | 0 refills | Status: DC

## 2018-04-15 NOTE — Telephone Encounter (Signed)
No refill date on medication, this patient is in healthcare(SNF)

## 2018-04-15 NOTE — Progress Notes (Signed)
Location:  [facility] Room Number: 32 Place of Service:  SNF (31) Provider:    Mast, Man X, NP  Patient Care Team: Mast, Man X, NP as PCP - General (Internal Medicine) Brooke Booze, MD as Consulting Physician (Cardiology) Marchia Bond, MD as Consulting Physician (Orthopedic Surgery) Kathrynn Ducking, MD as Consulting Physician (Neurology) Brand Males, MD as Consulting Physician (Pulmonary Disease)  Extended Emergency Contact Information Primary Emergency Contact: [place] Mobile Phone: [phone] Relation: Son Secondary Emergency Contact: Ricardo Jericho States of [place] Phone: [phone] Mobile Phone: [phone] Relation: Daughter  Code Status:  DNR Goals of care: Advanced Directive information Advanced Directives 04/15/2018  Does Patient Have a Medical Advance Directive? Yes  Type of Paramedic of [place];Out of facility DNR (pink MOST or yellow form);Living will  Does patient want to make changes to medical advance directive? No - Patient declined  Copy of [place] in Chart? Yes - validated most recent copy scanned in chart (See row information)  Pre-existing out of facility DNR order (yellow form or pink MOST form) Yellow form placed in chart (order not valid for inpatient use);Pink MOST form placed in chart (order not valid for inpatient use)     Chief Complaint  Patient presents with  . Medical Management of Chronic Issues    routine visit     HPI:  Pt is a 85 y.o. female seen today for medical management of chronic diseases.     Past Medical History:  Diagnosis Date  . Alzheimer's disease ([place]) 06/22/2015  . Anemia    iron deficient  . Anemia   . Anxiety   . Chronic airway obstruction, not elsewhere classified   . Chronic back pain   . Chronic cystitis   . Cystitis, chronic    Dr Gaynelle Arabian  . Degenerative arthritis   .  Depression   . Disturbance of skin sensation   . Disturbance of skin sensation   . Dyslipidemia   . Dyspnea    Dr Chase Caller  . Esophageal reflux   . Essential and other specified forms of tremor 12/11/2012  . Foot drop, bilateral 12/21/2013  . Gait disorder   . GERD (gastroesophageal reflux disease)   . Hemorrhoids   . Hereditary and idiopathic peripheral neuropathy 10/11/2015  . Hernia   . History of cerebrovascular disease    Moderate level small vessel disease  . Hyperlipidemia    mild  . Hypertension   . Osteoarthritis    right knee injection per Dr Trudie Reed  . Osteoporosis   . Other abnormal clinical finding   . Pain in joint, upper arm   . Peripheral vascular disease ([place])   . Rheumatoid arthritis ([place])    Dr Trudie Reed  . Tobacco use disorder   . Tremor    and gait disorder--Dr Jannifer Franklin  . Unspecified vitamin D deficiency    Past Surgical History:  Procedure Laterality Date  . APPENDECTOMY    . BACK SURGERY Bilateral    X2  . Bladder resuspension procedure    . CATARACT EXTRACTION Bilateral   . FEMUR IM NAIL Right 02/22/2014   Procedure: INTRAMEDULLARY (IM) NAIL FEMORAL;  Surgeon: Johnny Bridge, MD;  Location: [facility];  Service: Orthopedics;  Laterality: Right;  . INGUINAL HERNIA REPAIR Bilateral   . ORIF ANKLE FRACTURE Left 09/19/2016   Procedure: OPEN REDUCTION INTERNAL FIXATION (ORIF) ANKLE FRACTURE;  Surgeon: Vickey Huger, MD;  Location: [facility]  OR;  Service: Orthopedics;  Laterality: Left;  . TONSILLECTOMY      Allergies  Allergen Reactions  . Novocain [Procaine]     Passed  out  . Limbrel [Flavocoxid]     Dizziness   . Sertraline Anxiety    Outpatient Encounter Medications as of 04/15/2018  Medication Sig  . acetaminophen (TYLENOL) 650 MG CR tablet Take 650 mg by mouth 2 (two) times daily.   Marland Kitchen alum & mag hydroxide-simeth (MAALOX/MYLANTA) 200-200-20 MG/5ML suspension Take 30 mLs by mouth every 4 (four) hours as needed for indigestion or heartburn.  . Calcium  Carbonate-Vitamin D (CALTRATE 600+D) 600-400 MG-UNIT per tablet Take 1 tablet by mouth daily.  Marland Kitchen donepezil (ARICEPT) 10 MG tablet Take 10 mg by mouth at bedtime.  . famotidine (PEPCID) 20 MG tablet Take 20 mg by mouth at bedtime.  Marland Kitchen Hyoscyamine Sulfate SL (LEVSIN/SL) 0.125 MG SUBL Place under the tongue every 12 (twelve) hours as needed (for cramps).   . LORazepam (ATIVAN) 0.5 MG tablet Take 0.5 mg by mouth 2 (two) times daily.   . memantine (NAMENDA) 10 MG tablet Take 10 mg by mouth 2 (two) times daily.  . metoprolol tartrate (LOPRESSOR) 25 MG tablet Take 12.5 mg by mouth 2 (two) times daily. Hold for SBP <110 or HR <60/min  . mirtazapine (REMERON) 15 MG tablet Take 15 mg by mouth at bedtime.   . Misc Natural Products (OSTEO BI-FLEX TRIPLE STRENGTH) TABS Take 1 tablet by mouth daily.  . Multiple Vitamin (MULTIVITAMIN) tablet Take 1 tablet by mouth daily.  . Nutritional Supplements (RESOURCE 2.0) LIQD Take 120 mLs by mouth 3 (three) times daily.  Marland Kitchen omeprazole (PRILOSEC) 20 MG capsule Take 20 mg by mouth daily.  . ondansetron (ZOFRAN) 4 MG tablet Take 4 mg by mouth every 6 (six) hours as needed for nausea or vomiting.  . polyethylene glycol (MIRALAX / GLYCOLAX) packet Take 17 g by mouth daily.  . potassium chloride (K-DUR,KLOR-CON) 10 MEQ tablet Take 10 mEq by mouth daily.  . QUEtiapine (SEROQUEL) 25 MG tablet Take 25 mg by mouth 2 (two) times daily. At 9AM and 1PM  . sennosides-docusate sodium (SENOKOT-S) 8.6-50 MG tablet Take 2 tablets by mouth daily.  Marland Kitchen torsemide (DEMADEX) 20 MG tablet Take 20 mg by mouth daily.  Marland Kitchen zinc oxide 20 % ointment Apply 1 application topically as needed for irritation. Apply to buttocks   No facility-administered encounter medications on file as of 04/15/2018.     Review of Systems  Immunization History  Administered Date(s) Administered  . Influenza Whole 01/16/2010  . Influenza-Unspecified 02/03/2006, 01/09/2007, 01/05/2013, 12/22/2014, 12/28/2015, 12/26/2016,  12/29/2017  . PPD Test 05/01/2004, 02/24/2014  . Pneumococcal Polysaccharide-23 01/21/2006  . Pneumococcal-Unspecified 12/19/1998  . Td 03/18/1998  . Tdap 09/18/2016   Pertinent  Health Maintenance Due  Topic Date Due  . INFLUENZA VACCINE  Completed  . DEXA SCAN  Completed  . PNA vac Low Risk Adult  Completed   Fall Risk  11/28/2017 10/28/2016 06/20/2015  Falls in the past year? No No No  Risk for fall due to : - - Impaired balance/gait   Functional Status Survey:    Vitals:   04/15/18 1052  BP: 126/66  Pulse: 77  Resp: 20  Temp: 98.1 F (36.7 C)  SpO2: 94%  Weight: 122 lb 1.6 oz (55.4 kg)  Height: 5\' 1"  (1.549 m)   Body mass index is 23.07 kg/m. Physical Exam  Labs reviewed: Recent Labs    05/15/17 09/08/17  09/25/17  NA 140  140 142 143  143  K 4.2  4.2 4.6 4.5  4.3  CL 104 104  --   CO2 30 31  --   BUN 25* 37* 36*  CREATININE 0.8  0.82 1.0 1.0  1.00  CALCIUM 9.3  9.3 9.3 9.4  MG 2.1  --   --    Recent Labs    05/15/17 09/08/17  AST 16  16 18   ALT 8  8 10   ALKPHOS 70  70 90  BILITOT 0.3  --   PROT 6.5  6.5 6.5  ALBUMIN 3.2  3.2 3.3   Recent Labs    05/15/17 09/02/17 09/08/17  WBC 9.6  9.6 7.4 9.6  HGB 13.0  13.0 14.1 13.2  HCT 39  38.8 42.4 40  MCV  --  92.6  --   PLT 332  --  263   Lab Results  Component Value Date   TSH 1.09 05/15/2017   TSH 1.09 05/15/2017   Lab Results  Component Value Date   HGBA1C 5.8 12/16/2016   Lab Results  Component Value Date   CHOL 174 12/16/2016   CHOL 174 12/16/2016   HDL 68 12/16/2016   HDL 68 12/16/2016   LDLCALC 80 12/16/2016   LDLCALC 80 12/16/2016   TRIG 159 12/16/2016    Significant Diagnostic Results in last 30 days:  No results found.  Assessment/Plan There are no diagnoses linked to this encounter.   Family/ staff Communication:   Labs/tests ordered:

## 2018-04-20 NOTE — Progress Notes (Signed)
This encounter was created in error - please disregard.

## 2018-04-28 ENCOUNTER — Other Ambulatory Visit: Payer: Self-pay | Admitting: *Deleted

## 2018-04-28 DIAGNOSIS — F411 Generalized anxiety disorder: Secondary | ICD-10-CM

## 2018-04-28 MED ORDER — LORAZEPAM 0.5 MG PO TABS: 0.5000 mg | ORAL_TABLET | Freq: Two times a day (BID) | ORAL | 0 refills | Status: DC

## 2018-04-29 ENCOUNTER — Encounter: Payer: Self-pay | Admitting: Internal Medicine

## 2018-04-29 NOTE — Progress Notes (Signed)
Opened in error

## 2018-05-19 ENCOUNTER — Non-Acute Institutional Stay (SKILLED_NURSING_FACILITY): Payer: Medicare Other | Admitting: Nurse Practitioner

## 2018-05-19 ENCOUNTER — Encounter: Payer: Self-pay | Admitting: Nurse Practitioner

## 2018-05-19 DIAGNOSIS — I739 Peripheral vascular disease, unspecified: Secondary | ICD-10-CM

## 2018-05-19 DIAGNOSIS — I10 Essential (primary) hypertension: Secondary | ICD-10-CM

## 2018-05-19 DIAGNOSIS — K5901 Slow transit constipation: Secondary | ICD-10-CM | POA: Diagnosis not present

## 2018-05-19 DIAGNOSIS — G309 Alzheimer's disease, unspecified: Secondary | ICD-10-CM

## 2018-05-19 DIAGNOSIS — K219 Gastro-esophageal reflux disease without esophagitis: Secondary | ICD-10-CM

## 2018-05-19 DIAGNOSIS — F339 Major depressive disorder, recurrent, unspecified: Secondary | ICD-10-CM

## 2018-05-19 DIAGNOSIS — F0281 Dementia in other diseases classified elsewhere with behavioral disturbance: Secondary | ICD-10-CM

## 2018-05-19 NOTE — Assessment & Plan Note (Signed)
Stable, continue Senokot S II qd, Miralax qd.

## 2018-05-19 NOTE — Assessment & Plan Note (Signed)
Continue SNF [facility] for safety and care assistance. Continue Memantine, Donepezil for memory.

## 2018-05-19 NOTE — Assessment & Plan Note (Signed)
Blood pressure is controlled, continue Metoprolol 12.72m bid, Torsemide 260mqd, update CMP/eGFR

## 2018-05-19 NOTE — Assessment & Plan Note (Signed)
Chronic, trace edema BLE, TED

## 2018-05-19 NOTE — Assessment & Plan Note (Addendum)
Stable, continue Famotidine 20mg  qd, Omeprazole 20mg  qd. Update CBC

## 2018-05-19 NOTE — Progress Notes (Signed)
Location:  [place] Room Number: 32 Place of Service:  SNF (31) Provider:  Marlana Latus  NP  ,  X, NP  Patient Care Team: ,  X, NP as PCP - General (Internal Medicine) Jettie Booze, MD as Consulting Physician (Cardiology) Marchia Bond, MD as Consulting Physician (Orthopedic Surgery) Kathrynn Ducking, MD as Consulting Physician (Neurology) Brand Males, MD as Consulting Physician (Pulmonary Disease)  Extended Emergency Contact Information Primary Emergency Contact: Sharon of [place] Mobile Phone: [phone] Relation: Son Secondary Emergency Contact: Ricardo Jericho States of [place] Phone: [phone] Mobile Phone: [phone] Relation: Daughter  Code Status:  DNR Goals of care: Advanced Directive information Advanced Directives 05/19/2018  Does Patient Have a Medical Advance Directive? Yes  Type of Paramedic of [place];Living will;Out of facility DNR (pink MOST or yellow form)  Does patient want to make changes to medical advance directive? No - Patient declined  Copy of [place] in Chart? Yes - validated most recent copy scanned in chart (See row information)  Pre-existing out of facility DNR order (yellow form or pink MOST form) Yellow form placed in chart (order not valid for inpatient use);Pink MOST form placed in chart (order not valid for inpatient use)     Chief Complaint  Patient presents with  . Medical agement of Chronic Issues    HPI:  Pt is a 85 y.o. female seen today for medical management of chronic diseases.    The patient resides in SNF [facility] for safety and care assistance, on Memantine 73m bid, Donepezil 178mqd for memory. Her mood is stable on Mirtazapine 1573md, Seroquel 93m5md, Lorazepam 0.5mg 86m. GERD stable on Famotidine 20mg 44mOmeprazole 20mg q57mTN, blood pressure is controlled on Metoprolol 12.5mg bid72mTorsemide 20mg qd.87mstipation, stable on Senokot S II qd, MiraLax qd.    Past Medical History:  Diagnosis Date  . Alzheimer's disease (HCC) 4/6/[place]7  . Anemia    iron deficient  . Anemia   . Anxiety   . Chronic airway obstruction, not elsewhere classified   . Chronic back pain   . Chronic cystitis   . Cystitis, chronic    Dr TannenbauGaynelle Arabianerative arthritis   . Depression   . Disturbance of skin sensation   . Disturbance of skin sensation   . Dyslipidemia   . Dyspnea    Dr RamaswamyChase Callerageal reflux   . Essential and other specified forms of tremor 12/11/2012  . Foot drop, bilateral 12/21/2013  . Gait disorder   . GERD (gastroesophageal reflux disease)   . Hemorrhoids   . Hereditary and idiopathic peripheral neuropathy 10/11/2015  . Hernia   . History of cerebrovascular disease    Moderate level small vessel disease  . Hyperlipidemia    mild  . Hypertension   . Osteoarthritis    right knee injection per Dr Hawkes  .Trudie Reedporosis   . Other abnormal clinical finding   . Pain in joint, upper arm   . Peripheral vascular disease (HCC)   . Greenwoodumatoid arthritis (HCC)    DJolietawkes  .Trudie Reedco use disorder   . Tremor    and gait disorder--Dr [name]  .Jannifer Franklincified vitamin D deficiency    Past Surgical History:  Procedure Laterality Date  . APPENDECTOMY    . BACK SURGERY Bilateral    X2  . Bladder resuspension procedure    . CATARACT EXTRACTION Bilateral   .  FEMUR IM NAIL Right 02/22/2014   Procedure: INTRAMEDULLARY (IM) NAIL FEMORAL;  Surgeon: Johnny Bridge, MD;  Location: [facility];  Service: Orthopedics;  Laterality: Right;  . INGUINAL HERNIA REPAIR Bilateral   . ORIF ANKLE FRACTURE Left 09/19/2016   Procedure: OPEN REDUCTION INTERNAL FIXATION (ORIF) ANKLE FRACTURE;  Surgeon: Vickey Huger, MD;  Location: [facility];  Service: Orthopedics;  Laterality: Left;  . TONSILLECTOMY      Allergies  Allergen Reactions  . Novocain [Procaine]     Passed  out  . Limbrel  [Flavocoxid]     Dizziness   . Sertraline Anxiety    Outpatient Encounter Medications as of 05/19/2018  Medication Sig  . acetaminophen (TYLENOL) 650 MG CR tablet Take 650 mg by mouth 2 (two) times daily.   Marland Kitchen alum & mag hydroxide-simeth (MAALOX/MYLANTA) 200-200-20 MG/5ML suspension Take 30 mLs by mouth every 4 (four) hours as needed for indigestion or heartburn.  . Calcium Carbonate-Vitamin D (CALTRATE 600+D) 600-400 MG-UNIT per tablet Take 1 tablet by mouth daily.  Marland Kitchen donepezil (ARICEPT) 10 MG tablet Take 10 mg by mouth at bedtime.  . famotidine (PEPCID) 20 MG tablet Take 20 mg by mouth at bedtime.  Marland Kitchen Hyoscyamine Sulfate SL (LEVSIN/SL) 0.125 MG SUBL Place under the tongue every 12 (twelve) hours as needed (for cramps).   . LORazepam (ATIVAN) 0.5 MG tablet Take 1 tablet (0.5 mg total) by mouth 2 (two) times daily.  . memantine (NAMENDA) 10 MG tablet Take 10 mg by mouth 2 (two) times daily.  . metoprolol tartrate (LOPRESSOR) 25 MG tablet Take 12.5 mg by mouth 2 (two) times daily. Hold for SBP <110 or HR <60/min  . mirtazapine (REMERON) 15 MG tablet Take 15 mg by mouth at bedtime.   . Misc Natural Products (OSTEO BI-FLEX TRIPLE STRENGTH) TABS Take 1 tablet by mouth daily.  . Multiple Vitamin (MULTIVITAMIN) tablet Take 1 tablet by mouth daily.  . Nutritional Supplements (RESOURCE 2.0) LIQD Take 120 mLs by mouth 3 (three) times daily.  Marland Kitchen omeprazole (PRILOSEC) 20 MG capsule Take 20 mg by mouth daily.  . ondansetron (ZOFRAN) 4 MG tablet Take 4 mg by mouth every 6 (six) hours as needed for nausea or vomiting.  . polyethylene glycol (MIRALAX / GLYCOLAX) packet Take 17 g by mouth daily.  . potassium chloride (K-DUR,KLOR-CON) 10 MEQ tablet Take 10 mEq by mouth daily.  . QUEtiapine (SEROQUEL) 25 MG tablet Take 25 mg by mouth 2 (two) times daily. At 9AM and 1PM  . sennosides-docusate sodium (SENOKOT-S) 8.6-50 MG tablet Take 2 tablets by mouth daily.  Marland Kitchen torsemide (DEMADEX) 20 MG tablet Take 20 mg by mouth  daily.  Marland Kitchen zinc oxide 20 % ointment Apply 1 application topically as needed for irritation. Apply to buttocks   No facility-administered encounter medications on file as of 05/19/2018.    ROS was provided with assistance of staff Review of Systems  Constitutional: Negative for activity change, appetite change, chills, diaphoresis, fatigue, fever and unexpected weight change.  HENT: Positive for hearing loss. Negative for congestion and voice change.   Respiratory: Negative for cough, shortness of breath and wheezing.   Cardiovascular: Positive for leg swelling. Negative for chest pain and palpitations.  Gastrointestinal: Negative for abdominal distention, abdominal pain, constipation, diarrhea, nausea and vomiting.  Genitourinary: Negative for difficulty urinating, dysuria and urgency.  Musculoskeletal: Positive for arthralgias and gait problem.  Skin: Negative for color change and pallor.  Neurological: Negative for dizziness, speech difficulty, weakness and headaches.  Dementia  Psychiatric/Behavioral: Positive for confusion. Negative for agitation, behavioral problems, hallucinations and sleep disturbance. The patient is not nervous/anxious.     Immunization History  Administered Date(s) Administered  . Influenza Whole 01/16/2010  . Influenza-Unspecified 02/03/2006, 01/09/2007, 01/05/2013, 12/22/2014, 12/28/2015, 12/26/2016, 12/29/2017  . PPD Test 05/01/2004, 02/24/2014  . Pneumococcal Polysaccharide-23 01/21/2006  . Pneumococcal-Unspecified 12/19/1998  . Td 03/18/1998  . Tdap 09/18/2016  . Zoster Recombinat (Shingrix) 02/11/2018, 04/20/2018   Pertinent  Health Maintenance Due  Topic Date Due  . INFLUENZA VACCINE  Completed  . DEXA SCAN  Completed  . PNA vac Low Risk Adult  Completed   Fall Risk  11/28/2017 10/28/2016 06/20/2015  Falls in the past year? No No No  Risk for fall due to : - - Impaired balance/gait   Functional Status Survey:    Vitals:   05/19/18 0812  BP:  124/64  Pulse: 78  Resp: 18  Temp: 98.1 F (36.7 C)  SpO2: 95%  Weight: 124 lb 11.2 oz (56.6 kg)  Height: 5' 1" (1.549 m)   Body mass index is 23.56 kg/m. Physical Exam Constitutional:      General: She is not in acute distress.    Appearance: Normal appearance. She is normal weight. She is not ill-appearing, toxic-appearing or diaphoretic.  HENT:     Head: Normocephalic and atraumatic.     Nose: Nose normal.     Mouth/Throat:     Mouth: Mucous membranes are moist.  Eyes:     Extraocular Movements: Extraocular movements intact.     Pupils: Pupils are equal, round, and reactive to light.  Neck:     Musculoskeletal: Normal range of motion and neck supple.  Cardiovascular:     Rate and Rhythm: Normal rate and regular rhythm.     Heart sounds: Murmur present.  Pulmonary:     Breath sounds: No wheezing, rhonchi or rales.  Abdominal:     General: Bowel sounds are normal. There is no distension.     Palpations: Abdomen is soft.     Tenderness: There is no abdominal tenderness. There is no guarding or rebound.  Musculoskeletal:     Right lower leg: Edema present.     Left lower leg: Edema present.     Comments: Trace edema BLE. W/c for mobility.   Skin:    General: Skin is warm and dry.  Neurological:     General: No focal deficit present.     Mental Status: She is alert. Mental status is at baseline.     Motor: No weakness.     Coordination: Coordination normal.     Gait: Gait abnormal.     Comments: Oriented to person   Psychiatric:        Mood and Affect: Mood normal.        Behavior: Behavior normal.     Labs reviewed: Recent Labs    09/08/17 09/25/17  NA 142 143  143  K 4.6 4.5  4.3  CL 104  --   CO2 31  --   BUN 37* 36*  CREATININE 1.0 1.0  1.00  CALCIUM 9.3 9.4   Recent Labs    09/08/17  AST 18  ALT 10  ALKPHOS 90  PROT 6.5  ALBUMIN 3.3   Recent Labs    09/02/17 09/08/17  WBC 7.4 9.6  HGB 14.1 13.2  HCT 42.4 40  MCV 92.6  --   PLT  --   263   Lab Results  Component  Value Date   TSH 1.09 05/15/2017   TSH 1.09 05/15/2017   Lab Results  Component Value Date   HGBA1C 5.8 12/16/2016   Lab Results  Component Value Date   CHOL 174 12/16/2016   CHOL 174 12/16/2016   HDL 68 12/16/2016   HDL 68 12/16/2016   LDLCALC 80 12/16/2016   LDLCALC 80 12/16/2016   TRIG 159 12/16/2016    Significant Diagnostic Results in last 30 days:  No results found.  Assessment/Plan Essential hypertension Blood pressure is controlled, continue Metoprolol 12.3m bid, Torsemide 231mqd, update CMP/eGFR  PVD (peripheral vascular disease) (HCC) Chronic, trace edema BLE, TED   GERD (gastroesophageal reflux disease) Stable, continue Famotidine 2087md, Omeprazole 83m63m. Update CBC  Alzheimer's dementia with behavioral disturbance Continue SNF [facility] for safety and care assistance. Continue Memantine, Donepezil for memory.   Constipation Stable, continue Senokot S II qd, Miralax qd.   Depression, recurrent (HCC)[place] mood is stable, continue Seroquel 25mg60m, Mirtazapine 15mg 61m     Family/ staff Communication: plan of care reviewed with the patient and charge nurse.   Labs/tests ordered:  CBC CMP/eGFR  Time spend 25 minutes.

## 2018-05-19 NOTE — Assessment & Plan Note (Signed)
Her mood is stable, continue Seroquel 25mg  bid, Mirtazapine 15mg  qd.

## 2018-05-21 DIAGNOSIS — R609 Edema, unspecified: Secondary | ICD-10-CM | POA: Diagnosis not present

## 2018-05-21 DIAGNOSIS — D649 Anemia, unspecified: Secondary | ICD-10-CM | POA: Diagnosis not present

## 2018-05-28 ENCOUNTER — Other Ambulatory Visit: Payer: Self-pay

## 2018-05-28 DIAGNOSIS — F411 Generalized anxiety disorder: Secondary | ICD-10-CM

## 2018-05-28 MED ORDER — LORAZEPAM 0.5 MG PO TABS: 0.5000 mg | ORAL_TABLET | Freq: Two times a day (BID) | ORAL | 0 refills | Status: DC

## 2018-05-29 ENCOUNTER — Other Ambulatory Visit: Payer: Self-pay | Admitting: *Deleted

## 2018-05-29 DIAGNOSIS — F411 Generalized anxiety disorder: Secondary | ICD-10-CM

## 2018-05-29 MED ORDER — LORAZEPAM 0.5 MG PO TABS: 0.5000 mg | ORAL_TABLET | Freq: Two times a day (BID) | ORAL | 0 refills | Status: DC

## 2018-06-19 ENCOUNTER — Encounter: Payer: Self-pay | Admitting: Family

## 2018-06-19 ENCOUNTER — Non-Acute Institutional Stay (SKILLED_NURSING_FACILITY): Payer: Medicare Other | Admitting: Family

## 2018-06-19 DIAGNOSIS — I10 Essential (primary) hypertension: Secondary | ICD-10-CM

## 2018-06-19 DIAGNOSIS — L853 Xerosis cutis: Secondary | ICD-10-CM | POA: Diagnosis not present

## 2018-06-19 DIAGNOSIS — F0281 Dementia in other diseases classified elsewhere with behavioral disturbance: Secondary | ICD-10-CM | POA: Diagnosis not present

## 2018-06-19 DIAGNOSIS — K5901 Slow transit constipation: Secondary | ICD-10-CM | POA: Diagnosis not present

## 2018-06-19 DIAGNOSIS — G309 Alzheimer's disease, unspecified: Secondary | ICD-10-CM | POA: Diagnosis not present

## 2018-06-19 DIAGNOSIS — K219 Gastro-esophageal reflux disease without esophagitis: Secondary | ICD-10-CM

## 2018-06-19 NOTE — Progress Notes (Signed)
Location:  [place] Room Number: 32 Place of Service:  SNF (31) Provider:  Marlowe Sax  NP  Mast, Man X, NP  Patient Care Team: Mast, Man X, NP as PCP - General (Internal Medicine) Jettie Booze, MD as Consulting Physician (Cardiology) Marchia Bond, MD as Consulting Physician (Orthopedic Surgery) Kathrynn Ducking, MD as Consulting Physician (Neurology) Brand Males, MD as Consulting Physician (Pulmonary Disease)  Extended Emergency Contact Information Primary Emergency Contact: [place] of [place] Mobile Phone: [phone] Relation: Son Secondary Emergency Contact: Ricardo Jericho States of [place] Phone: [phone] Mobile Phone: [phone] Relation: Daughter  Code Status:  DNR Goals of care: Advanced Directive information Advanced Directives 06/19/2018  Does Patient Have a Medical Advance Directive? Yes  Type of Paramedic of [place];Living will;Out of facility DNR (pink MOST or yellow form)  Does patient want to make changes to medical advance directive? No - Patient declined  Copy of [place] in Chart? Yes - validated most recent copy scanned in chart (See row information)  Pre-existing out of facility DNR order (yellow form or pink MOST form) Yellow form placed in chart (order not valid for inpatient use);Pink MOST form placed in chart (order not valid for inpatient use)     Chief Complaint  Patient presents with   Medical Management of Chronic Issues    HPI:  Pt is a 85 y.o. female seen today for medical management of chronic diseases.she has a medical history of HTN,PVD,GERD,dementia with behavioral disturbance among other conditions.she is seen in her room today sitting on wheelchair.she complains of chronic feeling sick in her stomach and continues to call for " help,help" even when Nursing staff is present attending to her.Facility Nurse reports  patient had a skin tear on left forearm that patient stated sustained when turning in bed accidentally hit the wall on 06/08/18.No signs of infections.Skin tear has healed.Weight loss noted 121.4 lbs (06/03/18); 120.9 lbs (06/10/18); 122.3 lbs (06/17/18).she was seen by facility Registered Dietician 06/09/18 and Resource was increased to 180 cc three times daily with improvement in weight. No recent fall episode reported.      Past Medical History:  Diagnosis Date   Alzheimer's disease ([place]) 06/22/2015   Anemia    iron deficient   Anemia    Anxiety    Chronic airway obstruction, not elsewhere classified    Chronic back pain    Chronic cystitis    Cystitis, chronic    Dr Gaynelle Arabian   Degenerative arthritis    Depression    Disturbance of skin sensation    Disturbance of skin sensation    Dyslipidemia    Dyspnea    Dr Chase Caller   Esophageal reflux    Essential and other specified forms of tremor 12/11/2012   Foot drop, bilateral 12/21/2013   Gait disorder    GERD (gastroesophageal reflux disease)    Hemorrhoids    Hereditary and idiopathic peripheral neuropathy 10/11/2015   Hernia    History of cerebrovascular disease    Moderate level small vessel disease   Hyperlipidemia    mild   Hypertension    Osteoarthritis    right knee injection per Dr Trudie Reed   Osteoporosis    Other abnormal clinical finding    Pain in joint, upper arm    Peripheral vascular disease ([place])    Rheumatoid arthritis ([place])    Dr Trudie Reed   Tobacco use disorder    Tremor  and gait disorder--Dr Jannifer Franklin   Unspecified vitamin D deficiency    Past Surgical History:  Procedure Laterality Date   APPENDECTOMY     BACK SURGERY Bilateral    X2   Bladder resuspension procedure     CATARACT EXTRACTION Bilateral    FEMUR IM NAIL Right 02/22/2014   Procedure: INTRAMEDULLARY (IM) NAIL FEMORAL;  Surgeon: Johnny Bridge, MD;  Location: [facility];  Service: Orthopedics;  Laterality:  Right;   INGUINAL HERNIA REPAIR Bilateral    ORIF ANKLE FRACTURE Left 09/19/2016   Procedure: OPEN REDUCTION INTERNAL FIXATION (ORIF) ANKLE FRACTURE;  Surgeon: Vickey Huger, MD;  Location: [facility];  Service: Orthopedics;  Laterality: Left;   TONSILLECTOMY      Allergies  Allergen Reactions   Novocain [Procaine]     Passed  out   Limbrel [Flavocoxid]     Dizziness    Sertraline Anxiety    Outpatient Encounter Medications as of 06/19/2018  Medication Sig   acetaminophen (TYLENOL) 650 MG CR tablet Take 650 mg by mouth 2 (two) times daily.    alum & mag hydroxide-simeth (MAALOX/MYLANTA) 200-200-20 MG/5ML suspension Take 30 mLs by mouth every 4 (four) hours as needed for indigestion or heartburn.   Calcium Carbonate-Vitamin D (CALTRATE 600+D) 600-400 MG-UNIT per tablet Take 1 tablet by mouth daily.   donepezil (ARICEPT) 10 MG tablet Take 10 mg by mouth at bedtime.   famotidine (PEPCID) 20 MG tablet Take 20 mg by mouth at bedtime.   Hyoscyamine Sulfate SL (LEVSIN/SL) 0.125 MG SUBL Place under the tongue every 12 (twelve) hours as needed (for cramps).    LORazepam (ATIVAN) 0.5 MG tablet Take 1 tablet (0.5 mg total) by mouth 2 (two) times daily.   memantine (NAMENDA) 10 MG tablet Take 10 mg by mouth 2 (two) times daily.   metoprolol tartrate (LOPRESSOR) 25 MG tablet Take 12.5 mg by mouth 2 (two) times daily. Hold for SBP <110 or HR <60/min   mirtazapine (REMERON) 15 MG tablet Take 15 mg by mouth at bedtime.    Misc Natural Products (OSTEO BI-FLEX TRIPLE STRENGTH) TABS Take 1 tablet by mouth daily.   Multiple Vitamin (MULTIVITAMIN) tablet Take 1 tablet by mouth daily.   Nutritional Supplements (RESOURCE 2.0) LIQD Take 120 mLs by mouth 3 (three) times daily.   omeprazole (PRILOSEC) 20 MG capsule Take 20 mg by mouth daily.   ondansetron (ZOFRAN) 4 MG tablet Take 4 mg by mouth every 6 (six) hours as needed for nausea or vomiting.   polyethylene glycol (MIRALAX / GLYCOLAX) packet  Take 17 g by mouth daily.   potassium chloride (K-DUR,KLOR-CON) 10 MEQ tablet Take 10 mEq by mouth daily.   QUEtiapine (SEROQUEL) 25 MG tablet Take 25 mg by mouth 2 (two) times daily. At 9AM and 1PM   sennosides-docusate sodium (SENOKOT-S) 8.6-50 MG tablet Take 2 tablets by mouth daily.   torsemide (DEMADEX) 20 MG tablet Take 20 mg by mouth daily.   zinc oxide 20 % ointment Apply 1 application topically as needed for irritation. Apply to buttocks   No facility-administered encounter medications on file as of 06/19/2018.     Review of Systems  Unable to perform ROS: Dementia (additional information provided by facility Nurse )    Immunization History  Administered Date(s) Administered   Influenza Whole 01/16/2010   Influenza-Unspecified 02/03/2006, 01/09/2007, 01/05/2013, 12/22/2014, 12/28/2015, 12/26/2016, 12/29/2017   PPD Test 05/01/2004, 02/24/2014   Pneumococcal Polysaccharide-23 01/21/2006   Pneumococcal-Unspecified 12/19/1998   Td 03/18/1998   Tdap  09/18/2016   Zoster Recombinat (Shingrix) 02/11/2018, 04/20/2018   Pertinent  Health Maintenance Due  Topic Date Due   INFLUENZA VACCINE  10/17/2018   DEXA SCAN  Completed   PNA vac Low Risk Adult  Completed   Fall Risk  11/28/2017 10/28/2016 06/20/2015  Falls in the past year? No No No  Risk for fall due to : - - Impaired balance/gait    Vitals:   06/19/18 1043  BP: (!) 142/74  Pulse: 90  Resp: 18  Temp: (!) 96.9 F (36.1 C)  SpO2: 95%  Weight: 122 lb 4.8 oz (55.5 kg)  Height: 5\' 1"  (1.549 m)   Body mass index is 23.11 kg/m. Physical Exam Vitals signs and nursing note reviewed.  Constitutional:      General: She is not in acute distress.    Appearance: She is normal weight. She is not ill-appearing.  HENT:     Head: Normocephalic.     Right Ear: Tympanic membrane, ear canal and external ear normal. There is no impacted cerumen.     Left Ear: Tympanic membrane, ear canal and external ear normal. There  is no impacted cerumen.     Nose: Nose normal. No congestion or rhinorrhea.     Mouth/Throat:     Mouth: Mucous membranes are moist.     Pharynx: Oropharynx is clear. No oropharyngeal exudate or posterior oropharyngeal erythema.  Eyes:     General: No scleral icterus.       Right eye: No discharge.        Left eye: No discharge.     Conjunctiva/sclera: Conjunctivae normal.     Pupils: Pupils are equal, round, and reactive to light.  Neck:     Musculoskeletal: Normal range of motion. No neck rigidity or muscular tenderness.  Cardiovascular:     Rate and Rhythm: Normal rate and regular rhythm.     Pulses: Normal pulses.     Heart sounds: Murmur present. No friction rub. No gallop.   Pulmonary:     Effort: Pulmonary effort is normal. No respiratory distress.     Breath sounds: Normal breath sounds. No wheezing, rhonchi or rales.  Chest:     Chest wall: No tenderness.  Abdominal:     General: Bowel sounds are normal. There is no distension.     Palpations: Abdomen is soft. There is no mass.     Tenderness: There is no abdominal tenderness. There is no right CVA tenderness, left CVA tenderness, guarding or rebound.  Musculoskeletal:        General: No tenderness.     Right lower leg: Edema present.     Left lower leg: Edema present.     Comments: Self propel on wheelchair.bilateral lower extremities trace edema knee high ted hose in place.   Lymphadenopathy:     Cervical: No cervical adenopathy.  Skin:    General: Skin is warm.     Coloration: Skin is not pale.     Findings: No erythema or rash.     Comments: Bilateral lower extremities dry flaky skin.Skin tear healed.   Neurological:     Mental Status: She is alert. Mental status is at baseline.     Cranial Nerves: No cranial nerve deficit.     Sensory: No sensory deficit.     Motor: No weakness.     Coordination: Coordination normal.     Gait: Gait abnormal.  Psychiatric:        Mood and Affect: Mood normal.  Speech:  Speech normal.        Behavior: Behavior normal.        Thought Content: Thought content normal.        Cognition and Memory: Cognition is impaired. Memory is impaired.        Judgment: Judgment normal.    Labs reviewed: Recent Labs    09/08/17 09/25/17  NA 142 143   143  K 4.6 4.5   4.3  CL 104  --   CO2 31  --   BUN 37* 36*  CREATININE 1.0 1.0   1.00  CALCIUM 9.3 9.4   Recent Labs    09/08/17  AST 18  ALT 10  ALKPHOS 90  PROT 6.5  ALBUMIN 3.3   Recent Labs    09/02/17 09/08/17  WBC 7.4 9.6  HGB 14.1 13.2  HCT 42.4 40  MCV 92.6  --   PLT  --  263   Lab Results  Component Value Date   TSH 1.09 05/15/2017   TSH 1.09 05/15/2017   Lab Results  Component Value Date   HGBA1C 5.8 12/16/2016   Lab Results  Component Value Date   CHOL 174 12/16/2016   CHOL 174 12/16/2016   HDL 68 12/16/2016   HDL 68 12/16/2016   LDLCALC 80 12/16/2016   LDLCALC 80 12/16/2016   TRIG 159 12/16/2016    Significant Diagnostic Results in last 30 days:  No results found.  Assessment/Plan 1. Dry skin dermatitis Bilateral lower extremities skin dry and flaky.Apply Aquaphor ointment twice daily x 14 days.continue to monitor.   2. Essential hypertension B/p reviewed stable.continue on metoprolol 12.5 mg tablet twice daily and torsemide 20 mg tablet daily.on potassium 10 meq daily.  3. Slow transit constipation Current regimen effective.continue to encourage oral intake and hydration.   4. Gastroesophageal reflux disease without esophagitis Continue on omeprazole 20 mg capsule daily.  5. Alzheimer's dementia with behavioral disturbance, unspecified timing of dementia onset ([place]) No new behavioral issues.continue supportive care.continue seroquel 25 mg tablet twice daily,ativan 0.5 mg tablet twice daily,Namenda 10 mg tablet twice daily and Aricept 10 mg tablet daily.  Family/ staff Communication: Reviewed plan with patient and facility Nurse.    Labs/tests ordered: None

## 2018-07-07 ENCOUNTER — Non-Acute Institutional Stay (SKILLED_NURSING_FACILITY): Payer: Medicare Other | Admitting: Nurse Practitioner

## 2018-07-07 ENCOUNTER — Encounter: Payer: Self-pay | Admitting: Nurse Practitioner

## 2018-07-07 DIAGNOSIS — L97929 Non-pressure chronic ulcer of unspecified part of left lower leg with unspecified severity: Secondary | ICD-10-CM

## 2018-07-07 DIAGNOSIS — G309 Alzheimer's disease, unspecified: Secondary | ICD-10-CM

## 2018-07-07 DIAGNOSIS — I739 Peripheral vascular disease, unspecified: Secondary | ICD-10-CM

## 2018-07-07 DIAGNOSIS — R634 Abnormal weight loss: Secondary | ICD-10-CM | POA: Diagnosis not present

## 2018-07-07 DIAGNOSIS — I83029 Varicose veins of left lower extremity with ulcer of unspecified site: Secondary | ICD-10-CM | POA: Diagnosis not present

## 2018-07-07 DIAGNOSIS — F0281 Dementia in other diseases classified elsewhere with behavioral disturbance: Secondary | ICD-10-CM | POA: Diagnosis not present

## 2018-07-07 NOTE — Assessment & Plan Note (Signed)
Chronic swelling and scaly/dark redness appearance BLE. Encourage elevation during day. Continue Torsemide '20mg'$  qd. Update CMP/eGFR

## 2018-07-07 NOTE — Assessment & Plan Note (Addendum)
Dietitian to follow up. Update CBC/diff, CMP/eGFR, TSH.  Continue Mirtazapine 77m po qd.

## 2018-07-07 NOTE — Assessment & Plan Note (Addendum)
Continue SNF [facility] for safety and care assistance, w/c for mobility. Continue Memantine 10mg  bid, Donepezil 10mg  qd for memory.

## 2018-07-07 NOTE — Assessment & Plan Note (Addendum)
Mid of lateral left lower leg, a quarter sized, no peri wound cellulitis, small amount yellow drainage on dressing when removed. Will apply Silvadene cream bid x 4 weeks or until healed, cover the protective dressing. Observe. Update CBC/diff.

## 2018-07-07 NOTE — Progress Notes (Signed)
Location:  [facility] Room Number: 32 Place of Service:  SNF (31) Provider:  Marlana Latus  NP  Nancyann Cotterman X, NP  Patient Care Team: Chaniya Genter X, NP as PCP - General (Internal Medicine) Jettie Booze, MD as Consulting Physician (Cardiology) Marchia Bond, MD as Consulting Physician (Orthopedic Surgery) Kathrynn Ducking, MD as Consulting Physician (Neurology) Brand Males, MD as Consulting Physician (Pulmonary Disease)  Extended Emergency Contact Information Primary Emergency Contact: [place] of [place] Mobile Phone: [phone] Relation: Son Secondary Emergency Contact: Ricardo Jericho [place] of [place] Phone: [phone] Mobile Phone: [phone] Relation: Daughter  Code Status:  DNR Goals of care: Advanced Directive information Advanced Directives 06/19/2018  Does Patient Have a Medical Advance Directive? Yes  Type of Paramedic of [place];Living will;Out of facility DNR (pink MOST or yellow form)  Does patient want to make changes to medical advance directive? No - Patient declined  Copy of [place] in Chart? Yes - validated most recent copy scanned in chart (See row information)  Pre-existing out of facility DNR order (yellow form or pink MOST form) Yellow form placed in chart (order not valid for inpatient use);Pink MOST form placed in chart (order not valid for inpatient use)     Chief Complaint  Patient presents with   Acute Visit    C/o - LLE stasis ulcer     HPI:  Pt is a 85 y.o. female seen today for an acute visit for the patient has chronic venous stasis, scaly dark redness in BLE, a quarter sized ulcer at lateral LLE noted, no erythema or warmth peri wound, yellow drainage on dressing when it was removed. On Torsemide 15m qd. HPI was provided with assistance of staff, she takes Memantine 180mbid, Donepezil 1040md for memory. Staff reported the  patient's poor appetite, but denied abd pain, constipation, or vomiting.    Past Medical History:  Diagnosis Date   Alzheimer's disease ([facility]/08/2015   Anemia    iron deficient   Anemia    Anxiety    Chronic airway obstruction, not elsewhere classified    Chronic back pain    Chronic cystitis    Cystitis, chronic    Dr TanGaynelle ArabianDegenerative arthritis    Depression    Disturbance of skin sensation    Disturbance of skin sensation    Dyslipidemia    Dyspnea    Dr RamChase CallerEsophageal reflux    Essential and other specified forms of tremor 12/11/2012   Foot drop, bilateral 12/21/2013   Gait disorder    GERD (gastroesophageal reflux disease)    Hemorrhoids    Hereditary and idiopathic peripheral neuropathy 10/11/2015   Hernia    History of cerebrovascular disease    Moderate level small vessel disease   Hyperlipidemia    mild   Hypertension    Osteoarthritis    right knee injection per Dr HawTrudie ReedOsteoporosis    Other abnormal clinical finding    Pain in joint, upper arm    Peripheral vascular disease ([facility]  Rheumatoid arthritis ([facility]  Dr HawTrudie ReedTobacco use disorder    Tremor    and gait disorder--Dr WilJannifer FranklinUnspecified vitamin D deficiency    Past Surgical History:  Procedure Laterality Date   APPENDECTOMY     BACK SURGERY Bilateral    X2   Bladder resuspension procedure  CATARACT EXTRACTION Bilateral    FEMUR IM NAIL Right 02/22/2014   Procedure: INTRAMEDULLARY (IM) NAIL FEMORAL;  Surgeon: Johnny Bridge, MD;  Location: [facility];  Service: Orthopedics;  Laterality: Right;   INGUINAL HERNIA REPAIR Bilateral    ORIF ANKLE FRACTURE Left 09/19/2016   Procedure: OPEN REDUCTION INTERNAL FIXATION (ORIF) ANKLE FRACTURE;  Surgeon: Vickey Huger, MD;  Location: [facility];  Service: Orthopedics;  Laterality: Left;   TONSILLECTOMY      Allergies  Allergen Reactions   Novocain [Procaine]     Passed  out   Limbrel  [Flavocoxid]     Dizziness    Sertraline Anxiety    Outpatient Encounter Medications as of 07/07/2018  Medication Sig   acetaminophen (TYLENOL) 650 MG CR tablet Take 650 mg by mouth 2 (two) times daily.    alum & mag hydroxide-simeth (MAALOX/MYLANTA) 200-200-20 MG/5ML suspension Take 30 mLs by mouth every 4 (four) hours as needed for indigestion or heartburn.   Calcium Carbonate-Vitamin D (CALTRATE 600+D) 600-400 MG-UNIT per tablet Take 1 tablet by mouth daily.   donepezil (ARICEPT) 10 MG tablet Take 10 mg by mouth at bedtime.   famotidine (PEPCID) 20 MG tablet Take 20 mg by mouth at bedtime.   Hyoscyamine Sulfate SL (LEVSIN/SL) 0.125 MG SUBL Place under the tongue every 12 (twelve) hours as needed (for cramps).    LORazepam (ATIVAN) 0.5 MG tablet Take 1 tablet (0.5 mg total) by mouth 2 (two) times daily.   memantine (NAMENDA) 10 MG tablet Take 10 mg by mouth 2 (two) times daily.   metoprolol tartrate (LOPRESSOR) 25 MG tablet Take 12.5 mg by mouth 2 (two) times daily. Hold for SBP <110 or HR <60/min   mirtazapine (REMERON) 15 MG tablet Take 15 mg by mouth at bedtime.    Misc Natural Products (OSTEO BI-FLEX TRIPLE STRENGTH) TABS Take 1 tablet by mouth daily.   Multiple Vitamin (MULTIVITAMIN) tablet Take 1 tablet by mouth daily.   Nutritional Supplements (RESOURCE 2.0) LIQD Take 120 mLs by mouth 4 (four) times daily.    omeprazole (PRILOSEC) 20 MG capsule Take 20 mg by mouth daily.   ondansetron (ZOFRAN) 4 MG tablet Take 4 mg by mouth every 6 (six) hours as needed for nausea or vomiting.   polyethylene glycol (MIRALAX / GLYCOLAX) packet Take 17 g by mouth daily.   potassium chloride (K-DUR,KLOR-CON) 10 MEQ tablet Take 10 mEq by mouth daily.   QUEtiapine (SEROQUEL) 25 MG tablet Take 25 mg by mouth 2 (two) times daily. At 9AM and 1PM   sennosides-docusate sodium (SENOKOT-S) 8.6-50 MG tablet Take 2 tablets by mouth daily.   torsemide (DEMADEX) 20 MG tablet Take 20 mg by  mouth daily.   zinc oxide 20 % ointment Apply 1 application topically as needed for irritation. Apply to buttocks   No facility-administered encounter medications on file as of 07/07/2018.    ROS was provided with assistance of staff.  Review of Systems  Constitutional: Positive for appetite change, fatigue and unexpected weight change. Negative for activity change, chills and diaphoresis.       #4Ibs weight loss in the past 3-4 months.   HENT: Positive for hearing loss. Negative for congestion and voice change.   Eyes: Negative for visual disturbance.  Respiratory: Negative for cough, shortness of breath and wheezing.   Cardiovascular: Positive for leg swelling. Negative for chest pain and palpitations.  Gastrointestinal: Negative for abdominal distention, abdominal pain, constipation, diarrhea and vomiting.  Genitourinary: Negative for difficulty urinating, dysuria  and urgency.  Musculoskeletal: Positive for gait problem.  Neurological: Negative for dizziness, speech difficulty, weakness and headaches.       Dementia  Psychiatric/Behavioral: Negative for agitation, behavioral problems, hallucinations and sleep disturbance. The patient is not nervous/anxious.     Immunization History  Administered Date(s) Administered   Influenza Whole 01/16/2010   Influenza-Unspecified 02/03/2006, 01/09/2007, 01/05/2013, 12/22/2014, 12/28/2015, 12/26/2016, 12/29/2017   PPD Test 05/01/2004, 02/24/2014   Pneumococcal Polysaccharide-23 01/21/2006   Pneumococcal-Unspecified 12/19/1998   Td 03/18/1998   Tdap 09/18/2016   Zoster Recombinat (Shingrix) 02/11/2018, 04/20/2018   Pertinent  Health Maintenance Due  Topic Date Due   INFLUENZA VACCINE  10/17/2018   DEXA SCAN  Completed   PNA vac Low Risk Adult  Completed   Fall Risk  11/28/2017 10/28/2016 06/20/2015  Falls in the past year? No No No  Risk for fall due to : - - Impaired balance/gait   Functional Status Survey:    Vitals:    07/07/18 1237  BP: (!) 146/80  Pulse: 90  Resp: 20  Temp: 97.6 F (36.4 C)  SpO2: 93%  Weight: 122 lb 3.2 oz (55.4 kg)  Height: _0  (1.549 m)   Body mass index is 23.09 kg/m. Physical Exam Vitals signs and nursing note reviewed.  Constitutional:      Appearance: Normal appearance.  HENT:     Head: Normocephalic and atraumatic.     Nose: Nose normal.     Mouth/Throat:     Mouth: Mucous membranes are moist.  Eyes:     Extraocular Movements: Extraocular movements intact.     Pupils: Pupils are equal, round, and reactive to light.  Neck:     Musculoskeletal: Normal range of motion and neck supple.  Cardiovascular:     Rate and Rhythm: Normal rate and regular rhythm.     Heart sounds: Murmur present.  Pulmonary:     Breath sounds: No wheezing, rhonchi or rales.  Abdominal:     General: There is no distension.     Palpations: Abdomen is soft.     Tenderness: There is no abdominal tenderness. There is no guarding or rebound.  Musculoskeletal:     Right lower leg: Edema present.     Left lower leg: Edema present.     Comments: 1+ edema BLE. W/c for mobility.   Skin:    General: Skin is warm and dry.     Findings: Erythema present.     Comments: Scaly dark redness skin changes BLE. A quarter sized stasis ulcer mid lateral left lower leg, small yellow drainage on dressing when it was removed.   Neurological:     General: No focal deficit present.     Mental Status: She is alert. Mental status is at baseline.     Cranial Nerves: No cranial nerve deficit.     Motor: No weakness.     Coordination: Coordination normal.     Gait: Gait abnormal.     Comments: Oriented to self.   Psychiatric:        Mood and Affect: Mood normal.        Behavior: Behavior normal.     Labs reviewed: Recent Labs    09/08/17 09/25/17  NA 142 143   143  K 4.6 4.5   4.3  CL 104  --   CO2 31  --   BUN 37* 36*  CREATININE 1.0 1.0   1.00  CALCIUM 9.3 9.4   Recent Labs    09/08/17  AST 18   ALT 10  ALKPHOS 90  PROT 6.5  ALBUMIN 3.3   Recent Labs    09/02/17 09/08/17  WBC 7.4 9.6  HGB 14.1 13.2  HCT 42.4 40  MCV 92.6  --   PLT  --  263   Lab Results  Component Value Date   TSH 1.09 05/15/2017   TSH 1.09 05/15/2017   Lab Results  Component Value Date   HGBA1C 5.8 12/16/2016   Lab Results  Component Value Date   CHOL 174 12/16/2016   CHOL 174 12/16/2016   HDL 68 12/16/2016   HDL 68 12/16/2016   LDLCALC 80 12/16/2016   LDLCALC 80 12/16/2016   TRIG 159 12/16/2016    Significant Diagnostic Results in last 30 days:  No results found.  Assessment/Plan Stasis ulcer of left lower extremity (HCC) Mid of lateral left lower leg, a quarter sized, no peri wound cellulitis, small amount yellow drainage on dressing when removed. Will apply Silvadene cream bid x 4 weeks or until healed, cover the protective dressing. Observe. Update CBC/diff.   PVD (peripheral vascular disease) (HCC) Chronic swelling and scaly/dark redness appearance BLE. Encourage elevation during day. Continue Torsemide 40m qd. Update CMP/eGFR  Alzheimer's dementia with behavioral disturbance Continue SNF [facility] for safety and care assistance, w/c for mobility. Continue Memantine 123mbid, Donepezil 1010md for memory.   Weight loss Dietitian to follow up. Update CBC/diff, CMP/eGFR, TSH.  Continue Mirtazapine 23m17m qd.      Family/ staff Communication: plan of care reviewed with the patient and charge nurse.    Labs/tests ordered: CBC/diff, CMP/eGFR, TSH  Time spend 25 minutes.

## 2018-07-09 DIAGNOSIS — R609 Edema, unspecified: Secondary | ICD-10-CM | POA: Diagnosis not present

## 2018-07-09 DIAGNOSIS — I739 Peripheral vascular disease, unspecified: Secondary | ICD-10-CM | POA: Diagnosis not present

## 2018-07-09 DIAGNOSIS — I10 Essential (primary) hypertension: Secondary | ICD-10-CM | POA: Diagnosis not present

## 2018-07-09 LAB — BASIC METABOLIC PANEL
BUN: 26 — AB (ref 4–21)
Creatinine: 1.2 — AB (ref 0.5–1.1)
Glucose: 118
Potassium: 4.4 (ref 3.4–5.3)
Sodium: 144 (ref 137–147)

## 2018-07-09 LAB — CBC AND DIFFERENTIAL
HCT: 42 (ref 36–46)
Hemoglobin: 13.7 (ref 12.0–16.0)
Platelets: 319 (ref 150–399)

## 2018-07-09 LAB — HEPATIC FUNCTION PANEL
ALT: 10 (ref 7–35)
AST: 17 (ref 13–35)

## 2018-07-09 LAB — TSH: TSH: 1.49 (ref 0.41–5.90)

## 2018-07-10 ENCOUNTER — Encounter: Payer: Self-pay | Admitting: Nurse Practitioner

## 2018-07-13 ENCOUNTER — Other Ambulatory Visit: Payer: Self-pay | Admitting: *Deleted

## 2018-07-13 DIAGNOSIS — F411 Generalized anxiety disorder: Secondary | ICD-10-CM

## 2018-07-13 MED ORDER — LORAZEPAM 0.5 MG PO TABS: 0.5000 mg | ORAL_TABLET | Freq: Two times a day (BID) | ORAL | 0 refills | Status: AC

## 2018-07-23 ENCOUNTER — Non-Acute Institutional Stay (SKILLED_NURSING_FACILITY): Payer: Medicare Other | Admitting: Internal Medicine

## 2018-07-23 ENCOUNTER — Encounter: Payer: Self-pay | Admitting: Internal Medicine

## 2018-07-23 DIAGNOSIS — I83029 Varicose veins of left lower extremity with ulcer of unspecified site: Secondary | ICD-10-CM

## 2018-07-23 DIAGNOSIS — F0281 Dementia in other diseases classified elsewhere with behavioral disturbance: Secondary | ICD-10-CM | POA: Diagnosis not present

## 2018-07-23 DIAGNOSIS — I739 Peripheral vascular disease, unspecified: Secondary | ICD-10-CM

## 2018-07-23 DIAGNOSIS — R110 Nausea: Secondary | ICD-10-CM | POA: Diagnosis not present

## 2018-07-23 DIAGNOSIS — R609 Edema, unspecified: Secondary | ICD-10-CM | POA: Diagnosis not present

## 2018-07-23 DIAGNOSIS — G309 Alzheimer's disease, unspecified: Secondary | ICD-10-CM

## 2018-07-23 DIAGNOSIS — L97929 Non-pressure chronic ulcer of unspecified part of left lower leg with unspecified severity: Secondary | ICD-10-CM | POA: Diagnosis not present

## 2018-07-23 DIAGNOSIS — F339 Major depressive disorder, recurrent, unspecified: Secondary | ICD-10-CM

## 2018-07-23 DIAGNOSIS — K219 Gastro-esophageal reflux disease without esophagitis: Secondary | ICD-10-CM | POA: Diagnosis not present

## 2018-07-23 NOTE — Progress Notes (Signed)
Location:  [facility] Room Number: 32 Place of Service:  SNF (31) Provider:  L.MD   Virgie Dad, MD  Patient Care Team: Virgie Dad, MD as PCP - General (Internal Medicine) Jettie Booze, MD as Consulting Physician (Cardiology) Marchia Bond, MD as Consulting Physician (Orthopedic Surgery) Kathrynn Ducking, MD as Consulting Physician (Neurology) Brand Males, MD as Consulting Physician (Pulmonary Disease) Mast, Man X, NP as Nurse Practitioner (Internal Medicine)  Extended Emergency Contact Information Primary Emergency Contact: [place] of [place] Phone: [phone] Relation: Son Secondary Emergency Contact: Ricardo Jericho [place] of [place] Phone: [phone] Mobile Phone: [phone] Relation: Daughter  Code Status: DNR  Goals of care: Advanced Directive information Advanced Directives 07/23/2018  Does Patient Have a Medical Advance Directive? Yes  Type of Paramedic of [place];Out of facility DNR (pink MOST or yellow form);Living will  Does patient want to make changes to medical advance directive? No - Patient declined  Copy of [place] in Chart? Yes - validated most recent copy scanned in chart (See row information)  Pre-existing out of facility DNR order (yellow form or pink MOST form) Yellow form placed in chart (order not valid for inpatient use);Pink MOST form placed in chart (order not valid for inpatient use)     Chief Complaint  Patient presents with  . Medical Management of Chronic Issues    Routine visit     HPI:  Pt is a 85 y.o. female seen today for medical management of chronic diseases.    Patient has h/o PVD, LE Chronic Venous stasis, Chronic Nausea, Recent Failure to thrive , Cognitive impairment with Behavior Issues, h/o Left Ankle Fracture, Hypertension  Patient is doing good in facility. Nurses did not have  any acute issues for her. She has lost almost 5 lbs in past 3 months She  c/o Nausea and abdominal Pain. This is chronic complain for her. She is on Number of meds including Levisin, Prilosec,Zofran, Her appetite is fair. Bowels are moving good. Patient is mostly Wheelchair bound. No New Issues    Past Medical History:  Diagnosis Date  . Alzheimer's disease ([place]) 06/22/2015  . Anemia    iron deficient  . Anemia   . Anxiety   . Chronic airway obstruction, not elsewhere classified   . Chronic back pain   . Chronic cystitis   . Cystitis, chronic    Dr Gaynelle Arabian  . Degenerative arthritis   . Depression   . Disturbance of skin sensation   . Disturbance of skin sensation   . Dyslipidemia   . Dyspnea    Dr Chase Caller  . Esophageal reflux   . Essential and other specified forms of tremor 12/11/2012  . Foot drop, bilateral 12/21/2013  . Gait disorder   . GERD (gastroesophageal reflux disease)   . Hemorrhoids   . Hereditary and idiopathic peripheral neuropathy 10/11/2015  . Hernia   . History of cerebrovascular disease    Moderate level small vessel disease  . Hyperlipidemia    mild  . Hypertension   . Osteoarthritis    right knee injection per Dr Trudie Reed  . Osteoporosis   . Other abnormal clinical finding   . Pain in joint, upper arm   . Peripheral vascular disease ([place])   . Rheumatoid arthritis ([place])    Dr Trudie Reed  . Tobacco use disorder   . Tremor    and gait disorder--Dr Jannifer Franklin  . Unspecified vitamin  D deficiency    Past Surgical History:  Procedure Laterality Date  . APPENDECTOMY    . BACK SURGERY Bilateral    X2  . Bladder resuspension procedure    . CATARACT EXTRACTION Bilateral   . FEMUR IM NAIL Right 02/22/2014   Procedure: INTRAMEDULLARY (IM) NAIL FEMORAL;  Surgeon: Johnny Bridge, MD;  Location: [facility];  Service: Orthopedics;  Laterality: Right;  . INGUINAL HERNIA REPAIR Bilateral   . ORIF ANKLE FRACTURE Left 09/19/2016   Procedure: OPEN REDUCTION INTERNAL  FIXATION (ORIF) ANKLE FRACTURE;  Surgeon: Vickey Huger, MD;  Location: [facility];  Service: Orthopedics;  Laterality: Left;  . TONSILLECTOMY      Allergies  Allergen Reactions  . Novocain [Procaine]     Passed  out  . Limbrel [Flavocoxid]     Dizziness   . Sertraline Anxiety    Outpatient Encounter Medications as of 07/23/2018  Medication Sig  . acetaminophen (TYLENOL) 650 MG CR tablet Take 650 mg by mouth 2 (two) times daily.   Marland Kitchen alum & mag hydroxide-simeth (MAALOX/MYLANTA) 200-200-20 MG/5ML suspension Take 30 mLs by mouth every 4 (four) hours as needed for indigestion or heartburn.  . Calcium Carbonate-Vitamin D (CALTRATE 600+D) 600-400 MG-UNIT per tablet Take 1 tablet by mouth daily.  Marland Kitchen donepezil (ARICEPT) 10 MG tablet Take 10 mg by mouth at bedtime.  . famotidine (PEPCID) 20 MG tablet Take 20 mg by mouth at bedtime.  Marland Kitchen Hyoscyamine Sulfate SL (LEVSIN/SL) 0.125 MG SUBL Place under the tongue every 12 (twelve) hours as needed (for cramps).   . LORazepam (ATIVAN) 0.5 MG tablet Take 1 tablet (0.5 mg total) by mouth 2 (two) times daily.  . memantine (NAMENDA) 10 MG tablet Take 10 mg by mouth 2 (two) times daily.  . metoprolol tartrate (LOPRESSOR) 25 MG tablet Take 12.5 mg by mouth 2 (two) times daily. Hold for SBP <110 or HR <60/min  . mirtazapine (REMERON) 15 MG tablet Take 15 mg by mouth at bedtime.   . Misc Natural Products (OSTEO BI-FLEX TRIPLE STRENGTH) TABS Take 1 tablet by mouth daily.  . Multiple Vitamin (MULTIVITAMIN) tablet Take 1 tablet by mouth daily.  . Nutritional Supplements (RESOURCE 2.0) LIQD Take 120 mLs by mouth 4 (four) times daily.   Marland Kitchen omeprazole (PRILOSEC) 20 MG capsule Take 20 mg by mouth daily.  . ondansetron (ZOFRAN) 4 MG tablet Take 4 mg by mouth every 6 (six) hours as needed for nausea or vomiting.  . polyethylene glycol (MIRALAX / GLYCOLAX) packet Take 17 g by mouth daily.  . potassium chloride (K-DUR,KLOR-CON) 10 MEQ tablet Take 10 mEq by mouth daily.  .  QUEtiapine (SEROQUEL) 25 MG tablet Take 25 mg by mouth 2 (two) times daily. At 9AM and 1PM  . sennosides-docusate sodium (SENOKOT-S) 8.6-50 MG tablet Take 2 tablets by mouth daily.  . silver sulfADIAZINE (SILVADENE) 1 % cream Apply 1 application topically daily. Apply to left lateral leg open area x4 weeks cover with a non adherent dressing  . torsemide (DEMADEX) 20 MG tablet Take 20 mg by mouth daily.  . [DISCONTINUED] zinc oxide 20 % ointment Apply 1 application topically as needed for irritation. Apply to buttocks   No facility-administered encounter medications on file as of 07/23/2018.     Review of Systems  Constitutional: Negative.   HENT: Negative.   Respiratory: Negative.   Cardiovascular: Positive for leg swelling.  Gastrointestinal: Positive for nausea.  Genitourinary: Negative.   Musculoskeletal: Negative.   Neurological: Negative.   Psychiatric/Behavioral:  Negative.     Immunization History  Administered Date(s) Administered  . Influenza Whole 01/16/2010  . Influenza-Unspecified 02/03/2006, 01/09/2007, 01/05/2013, 12/22/2014, 12/28/2015, 12/26/2016, 12/29/2017  . PPD Test 05/01/2004, 02/24/2014  . Pneumococcal Polysaccharide-23 01/21/2006  . Pneumococcal-Unspecified 12/19/1998  . Td 03/18/1998  . Tdap 09/18/2016  . Zoster Recombinat (Shingrix) 02/11/2018, 04/20/2018   Pertinent  Health Maintenance Due  Topic Date Due  . INFLUENZA VACCINE  10/17/2018  . DEXA SCAN  Completed  . PNA vac Low Risk Adult  Completed   Fall Risk  11/28/2017 10/28/2016 06/20/2015  Falls in the past year? No No No  Risk for fall due to : - - Impaired balance/gait   Functional Status Survey:    Vitals:   07/23/18 0855  BP: 114/69  Pulse: 76  Resp: 18  Temp: (!) 97.1 F (36.2 C)  SpO2: 96%  Weight: 118 lb 14.4 oz (53.9 kg)  Height: 5\' 1"  (1.549 m)   Body mass index is 22.47 kg/m. Physical Exam Vitals signs reviewed.  Constitutional:      Appearance: Normal appearance.  HENT:      Head: Normocephalic.     Nose: Nose normal.     Mouth/Throat:     Mouth: Mucous membranes are moist.     Pharynx: Oropharynx is clear.  Eyes:     Pupils: Pupils are equal, round, and reactive to light.  Neck:     Musculoskeletal: Neck supple.  Cardiovascular:     Rate and Rhythm: Normal rate and regular rhythm.     Pulses: Normal pulses.     Heart sounds: Normal heart sounds.  Pulmonary:     Effort: Pulmonary effort is normal.     Breath sounds: Normal breath sounds.  Abdominal:     General: Abdomen is flat. Bowel sounds are normal.     Palpations: Abdomen is soft.  Musculoskeletal:     Comments: Bilateral Chronic venous Changes with Blisters and Mild Edema  She also Has 1 open Venous Ulcer. Per Nurses it has discharge from the ulcer  Skin:    General: Skin is warm.  Neurological:     General: No focal deficit present.     Mental Status: She is alert.  Psychiatric:        Mood and Affect: Mood normal.        Thought Content: Thought content normal.        Judgment: Judgment normal.     Labs reviewed: Recent Labs    09/08/17 09/25/17 07/09/18  NA 142 143  143 144  K 4.6 4.5  4.3 4.4  CL 104  --   --   CO2 31  --   --   BUN 37* 36* 26*  CREATININE 1.0 1.0  1.00 1.2*  CALCIUM 9.3 9.4  --    Recent Labs    09/08/17 07/09/18  AST 18 17  ALT 10 10  ALKPHOS 90  --   PROT 6.5  --   ALBUMIN 3.3  --    Recent Labs    09/02/17 09/08/17 07/09/18  WBC 7.4 9.6  --   HGB 14.1 13.2 13.7  HCT 42.4 40 42  MCV 92.6  --   --   PLT  --  263 319   Lab Results  Component Value Date   TSH 1.49 07/09/2018   Lab Results  Component Value Date   HGBA1C 5.8 12/16/2016   Lab Results  Component Value Date   CHOL 174 12/16/2016  CHOL 174 12/16/2016   HDL 68 12/16/2016   HDL 68 12/16/2016   LDLCALC 80 12/16/2016   LDLCALC 80 12/16/2016   TRIG 159 12/16/2016    Significant Diagnostic Results in last 30 days:  No results found.  Assessment/Plan PVD (peripheral  vascular disease)  Continue on Torsemide There is not much swelling and I don't think Ted hoses will help  Stasis ulcer of left lower extremity  Continue Silvadene  Alzheimer's dementia with behavioral disturbance,  On Namenda and Aricept Will discontinue her Aricept due to c/o Chronic Nausea Continue on Namenda Also On Seroquel Will consider GDR once she is stable off Aricept  Depression, recurrent  On Remeron and Ativan   Gastroesophageal reflux disease  On Prilosec  Chronic nausea Continue Levisin, Pepcid, Zofran PRN     Family/ staff Communication:   Labs/tests ordered:    Total time spent in this patient care encounter was  25_  minutes; greater than 50% of the visit spent counseling patient and staff, reviewing records , Labs and coordinating care for problems addressed at this encounter.

## 2018-07-24 ENCOUNTER — Non-Acute Institutional Stay (SKILLED_NURSING_FACILITY): Payer: Medicare Other | Admitting: Family

## 2018-07-24 ENCOUNTER — Encounter: Payer: Self-pay | Admitting: Family

## 2018-07-24 DIAGNOSIS — I739 Peripheral vascular disease, unspecified: Secondary | ICD-10-CM

## 2018-07-24 DIAGNOSIS — S99922A Unspecified injury of left foot, initial encounter: Secondary | ICD-10-CM | POA: Diagnosis not present

## 2018-07-24 NOTE — Progress Notes (Signed)
Location:  [facility] Room Number: 32 Place of Service:  SNF (31) Provider:  Kelby Fam, Mabelle Mungin  NP  Virgie Dad, MD  Patient Care Team: Virgie Dad, MD as PCP - General (Internal Medicine) Jettie Booze, MD as Consulting Physician (Cardiology) Marchia Bond, MD as Consulting Physician (Orthopedic Surgery) Kathrynn Ducking, MD as Consulting Physician (Neurology) Brand Males, MD as Consulting Physician (Pulmonary Disease) Mast, Man X, NP as Nurse Practitioner (Internal Medicine)  Extended Emergency Contact Information Primary Emergency Contact: Ysidro Evert [place] of [place] Phone: [phone] Relation: Son Secondary Emergency Contact: Ricardo Jericho States of [place] Phone: [phone] Mobile Phone: [phone] Relation: Daughter  Code Status:  DNR Goals of care: Advanced Directive information Advanced Directives 07/23/2018  Does Patient Have a Medical Advance Directive? Yes  Type of Paramedic of [place];Out of facility DNR (pink MOST or yellow form);Living will  Does patient want to make changes to medical advance directive? No - Patient declined  Copy of [place] in Chart? Yes - validated most recent copy scanned in chart (See row information)  Pre-existing out of facility DNR order (yellow form or pink MOST form) Yellow form placed in chart (order not valid for inpatient use);Pink MOST form placed in chart (order not valid for inpatient use)     Chief Complaint  Patient presents with  . Acute Visit    C/o - (L) great toe nail split    HPI:  Pt is a 85 y.o. female seen today for an acute visit for evaluation of left great toenail split and bleeding.she is seen in her room today per facility Nurse supervisor request.Nurse states CNA reported that patient's toenail was bleeding.No injuries to toenail.Patient states did not that the toenail was bleeding.she  has a significant medical history of Peripheral vascular disease and Alzheimer's dementia with behavioral disturbance among other conditions.No fever, chills or falls reported.      Past Medical History:  Diagnosis Date  . Alzheimer's disease ([place]) 06/22/2015  . Anemia    iron deficient  . Anemia   . Anxiety   . Chronic airway obstruction, not elsewhere classified   . Chronic back pain   . Chronic cystitis   . Cystitis, chronic    Dr Gaynelle Arabian  . Degenerative arthritis   . Depression   . Disturbance of skin sensation   . Disturbance of skin sensation   . Dyslipidemia   . Dyspnea    Dr Chase Caller  . Esophageal reflux   . Essential and other specified forms of tremor 12/11/2012  . Foot drop, bilateral 12/21/2013  . Gait disorder   . GERD (gastroesophageal reflux disease)   . Hemorrhoids   . Hereditary and idiopathic peripheral neuropathy 10/11/2015  . Hernia   . History of cerebrovascular disease    Moderate level small vessel disease  . Hyperlipidemia    mild  . Hypertension   . Osteoarthritis    right knee injection per Dr Trudie Reed  . Osteoporosis   . Other abnormal clinical finding   . Pain in joint, upper arm   . Peripheral vascular disease ([place])   . Rheumatoid arthritis ([place])    Dr Trudie Reed  . Tobacco use disorder   . Tremor    and gait disorder--Dr Jannifer Franklin  . Unspecified vitamin D deficiency    Past Surgical History:  Procedure Laterality Date  . APPENDECTOMY    . BACK SURGERY Bilateral    X2  .  Bladder resuspension procedure    . CATARACT EXTRACTION Bilateral   . FEMUR IM NAIL Right 02/22/2014   Procedure: INTRAMEDULLARY (IM) NAIL FEMORAL;  Surgeon: Johnny Bridge, MD;  Location: [facility];  Service: Orthopedics;  Laterality: Right;  . INGUINAL HERNIA REPAIR Bilateral   . ORIF ANKLE FRACTURE Left 09/19/2016   Procedure: OPEN REDUCTION INTERNAL FIXATION (ORIF) ANKLE FRACTURE;  Surgeon: Vickey Huger, MD;  Location: [facility];  Service: Orthopedics;  Laterality: Left;  .  TONSILLECTOMY      Allergies  Allergen Reactions  . Novocain [Procaine]     Passed  out  . Limbrel [Flavocoxid]     Dizziness   . Sertraline Anxiety    Outpatient Encounter Medications as of 07/24/2018  Medication Sig  . acetaminophen (TYLENOL) 650 MG CR tablet Take 650 mg by mouth 2 (two) times daily.   Marland Kitchen alum & mag hydroxide-simeth (MAALOX/MYLANTA) 200-200-20 MG/5ML suspension Take 30 mLs by mouth every 4 (four) hours as needed for indigestion or heartburn.  . Calcium Carbonate-Vitamin D (CALTRATE 600+D) 600-400 MG-UNIT per tablet Take 1 tablet by mouth daily.  . famotidine (PEPCID) 20 MG tablet Take 20 mg by mouth at bedtime.  Marland Kitchen Hyoscyamine Sulfate SL (LEVSIN/SL) 0.125 MG SUBL Place under the tongue every 12 (twelve) hours as needed (for cramps).   . LORazepam (ATIVAN) 0.5 MG tablet Take 1 tablet (0.5 mg total) by mouth 2 (two) times daily.  . memantine (NAMENDA) 10 MG tablet Take 10 mg by mouth 2 (two) times daily.  . metoprolol tartrate (LOPRESSOR) 25 MG tablet Take 12.5 mg by mouth 2 (two) times daily. Hold for SBP <110 or HR <60/min  . mirtazapine (REMERON) 15 MG tablet Take 15 mg by mouth at bedtime.   . Misc Natural Products (OSTEO BI-FLEX TRIPLE STRENGTH) TABS Take 1 tablet by mouth daily.  . Multiple Vitamin (MULTIVITAMIN) tablet Take 1 tablet by mouth daily.  . Nutritional Supplements (RESOURCE 2.0) LIQD Take 120 mLs by mouth 4 (four) times daily.   Marland Kitchen omeprazole (PRILOSEC) 20 MG capsule Take 20 mg by mouth daily.  . ondansetron (ZOFRAN) 4 MG tablet Take 4 mg by mouth every 6 (six) hours as needed for nausea or vomiting.  . polyethylene glycol (MIRALAX / GLYCOLAX) packet Take 17 g by mouth daily.  . potassium chloride (K-DUR,KLOR-CON) 10 MEQ tablet Take 10 mEq by mouth daily.  . QUEtiapine (SEROQUEL) 25 MG tablet Take 25 mg by mouth 2 (two) times daily. At 9AM and 1PM  . sennosides-docusate sodium (SENOKOT-S) 8.6-50 MG tablet Take 2 tablets by mouth daily.  . silver  sulfADIAZINE (SILVADENE) 1 % cream Apply 1 application topically daily. Apply to left lateral leg open area x4 weeks cover with a non adherent dressing  . torsemide (DEMADEX) 20 MG tablet Take 20 mg by mouth daily.  . [DISCONTINUED] donepezil (ARICEPT) 10 MG tablet Take 10 mg by mouth at bedtime.   No facility-administered encounter medications on file as of 07/24/2018.     Review of Systems  Unable to perform ROS: Dementia (additional information provided by facility Nurse )    Immunization History  Administered Date(s) Administered  . Influenza Whole 01/16/2010  . Influenza-Unspecified 02/03/2006, 01/09/2007, 01/05/2013, 12/22/2014, 12/28/2015, 12/26/2016, 12/29/2017  . PPD Test 05/01/2004, 02/24/2014  . Pneumococcal Polysaccharide-23 01/21/2006  . Pneumococcal-Unspecified 12/19/1998  . Td 03/18/1998  . Tdap 09/18/2016  . Zoster Recombinat (Shingrix) 02/11/2018, 04/20/2018   Pertinent  Health Maintenance Due  Topic Date Due  . INFLUENZA VACCINE  10/17/2018  . DEXA SCAN  Completed  . PNA vac Low Risk Adult  Completed   Fall Risk  11/28/2017 10/28/2016 06/20/2015  Falls in the past year? No No No  Risk for fall due to : - - Impaired balance/gait     Vitals:   07/24/18 1300  BP: 116/74  Pulse: 94  Resp: 18  Temp: (!) 97 F (36.1 C)  SpO2: 91%  Weight: 118 lb 14.4 oz (53.9 kg)  Height: 5\' 1"  (1.549 m)   Body mass index is 22.47 kg/m. Physical Exam Vitals signs and nursing note reviewed.  Constitutional:      General: She is not in acute distress.    Appearance: She is normal weight. She is not ill-appearing.  HENT:     Head: Normocephalic.  Cardiovascular:     Rate and Rhythm: Normal rate and regular rhythm.     Heart sounds: Normal heart sounds. No murmur. No friction rub. No gallop.   Pulmonary:     Effort: Pulmonary effort is normal. No respiratory distress.     Breath sounds: Normal breath sounds. No wheezing, rhonchi or rales.  Chest:     Chest wall: No  tenderness.  Abdominal:     General: Bowel sounds are normal. There is no distension.     Palpations: Abdomen is soft. There is no mass.     Tenderness: There is no abdominal tenderness. There is no right CVA tenderness, left CVA tenderness, guarding or rebound.  Musculoskeletal:        General: No swelling or tenderness.     Comments: Moves x 4 extremities.unsteady gait self propels on wheelchair.bilateral lower extremities trace edema.   Skin:    General: Skin is warm and dry.     Coloration: Skin is not pale.     Findings: No erythema or rash.     Comments: biaterall lower extremities bluish-purple cold to touch.left great toenail bright red blood with mid-toenail split.No signs of infections.    Neurological:     Mental Status: She is alert. Mental status is at baseline.     Cranial Nerves: No cranial nerve deficit.     Sensory: No sensory deficit.     Motor: No weakness.     Coordination: Coordination normal.     Gait: Gait abnormal.  Psychiatric:        Mood and Affect: Mood normal.        Behavior: Behavior normal.        Thought Content: Thought content normal.        Judgment: Judgment normal.    Labs reviewed: Recent Labs    09/08/17 09/25/17 07/09/18  NA 142 143  143 144  K 4.6 4.5  4.3 4.4  CL 104  --   --   CO2 31  --   --   BUN 37* 36* 26*  CREATININE 1.0 1.0  1.00 1.2*  CALCIUM 9.3 9.4  --    Recent Labs    09/08/17 07/09/18  AST 18 17  ALT 10 10  ALKPHOS 90  --   PROT 6.5  --   ALBUMIN 3.3  --    Recent Labs    09/02/17 09/08/17 07/09/18  WBC 7.4 9.6  --   HGB 14.1 13.2 13.7  HCT 42.4 40 42  MCV 92.6  --   --   PLT  --  263 319   Lab Results  Component Value Date   TSH 1.49 07/09/2018  Lab Results  Component Value Date   HGBA1C 5.8 12/16/2016   Lab Results  Component Value Date   CHOL 174 12/16/2016   CHOL 174 12/16/2016   HDL 68 12/16/2016   HDL 68 12/16/2016   LDLCALC 80 12/16/2016   LDLCALC 80 12/16/2016   TRIG 159  12/16/2016    Significant Diagnostic Results in last 30 days:  No results found.  Assessment/Plan  1.  Injury of great toe of left foot, initial encounter Left great toe nail split to middle of nail with bright red blood noted.Non-tender to touch moves toe without any difficulties. - cleanse toenail with saline,pat dry,apply triple antibiotic ointment and cover with band aid.change dressing daily.will consider referral to podiatrist to trim toenail if not healing once COVID-19 restrictions are over.   2. Peripheral Vascular Disease  Trace edema,bluish-purple in color and cool to touch.No ulceration noted.continue ted hose on in the morning and off at bedtime.Not a candidate for ASA or statin due her hx of and high risk for falls.continue to monitor.   Family/ staff Communication: Reviewed plan of care with patient and facility Nurse supervisor.   Labs/tests ordered: None

## 2018-08-12 ENCOUNTER — Non-Acute Institutional Stay (SKILLED_NURSING_FACILITY): Payer: Medicare Other | Admitting: Internal Medicine

## 2018-08-12 ENCOUNTER — Encounter: Payer: Self-pay | Admitting: Internal Medicine

## 2018-08-12 DIAGNOSIS — L97929 Non-pressure chronic ulcer of unspecified part of left lower leg with unspecified severity: Secondary | ICD-10-CM | POA: Diagnosis not present

## 2018-08-12 DIAGNOSIS — G309 Alzheimer's disease, unspecified: Secondary | ICD-10-CM | POA: Diagnosis not present

## 2018-08-12 DIAGNOSIS — I129 Hypertensive chronic kidney disease with stage 1 through stage 4 chronic kidney disease, or unspecified chronic kidney disease: Secondary | ICD-10-CM | POA: Diagnosis not present

## 2018-08-12 DIAGNOSIS — I83029 Varicose veins of left lower extremity with ulcer of unspecified site: Secondary | ICD-10-CM

## 2018-08-12 DIAGNOSIS — I739 Peripheral vascular disease, unspecified: Secondary | ICD-10-CM | POA: Diagnosis not present

## 2018-08-12 DIAGNOSIS — N183 Chronic kidney disease, stage 3 unspecified: Secondary | ICD-10-CM

## 2018-08-12 DIAGNOSIS — F0281 Dementia in other diseases classified elsewhere with behavioral disturbance: Secondary | ICD-10-CM | POA: Diagnosis not present

## 2018-08-12 NOTE — Progress Notes (Signed)
Location:  [facility] Room Number: 32/A Place of Service:  SNF (31) Provider:Gupta Meredith Staggers L,MD   Virgie Dad, MD  Patient Care Team: Virgie Dad, MD as PCP - General (Internal Medicine) Jettie Booze, MD as Consulting Physician (Cardiology) Marchia Bond, MD as Consulting Physician (Orthopedic Surgery) Kathrynn Ducking, MD as Consulting Physician (Neurology) Brand Males, MD as Consulting Physician (Pulmonary Disease) Mast, Man X, NP as Nurse Practitioner (Internal Medicine) Ngetich, Nelda Bucks, NP as Nurse Practitioner (Family Medicine)  Extended Emergency Contact Information Primary Emergency Contact: [place] of [place] Phone: [phone] Relation: Son Secondary Emergency Contact: Ricardo Jericho States of [place] Phone: [phone] Mobile Phone: [phone] Relation: Daughter  Code Status: DNR  Goals of care: Advanced Directive information Advanced Directives 08/12/2018  Does Patient Have a Medical Advance Directive? Yes  Type of Paramedic of [place];Living will;Out of facility DNR (pink MOST or yellow form)  Does patient want to make changes to medical advance directive? No - Patient declined  Copy of [place] in Chart? Yes - validated most recent copy scanned in chart (See row information)  Pre-existing out of facility DNR order (yellow form or pink MOST form) Yellow form placed in chart (order not valid for inpatient use);Pink MOST form placed in chart (order not valid for inpatient use)     Chief Complaint  Patient presents with  . Acute Visit    Discoloration of LE     HPI:  Pt is a 85 y.o. female seen today for an acute visit for    Past Medical History:  Diagnosis Date  . Alzheimer's disease ([place]) 06/22/2015  . Anemia    iron deficient  . Anemia   . Anxiety   . Chronic airway obstruction, not elsewhere classified   . Chronic  back pain   . Chronic cystitis   . Cystitis, chronic    Dr Gaynelle Arabian  . Degenerative arthritis   . Depression   . Disturbance of skin sensation   . Disturbance of skin sensation   . Dyslipidemia   . Dyspnea    Dr Chase Caller  . Esophageal reflux   . Essential and other specified forms of tremor 12/11/2012  . Foot drop, bilateral 12/21/2013  . Gait disorder   . GERD (gastroesophageal reflux disease)   . Hemorrhoids   . Hereditary and idiopathic peripheral neuropathy 10/11/2015  . Hernia   . History of cerebrovascular disease    Moderate level small vessel disease  . Hyperlipidemia    mild  . Hypertension   . Osteoarthritis    right knee injection per Dr Trudie Reed  . Osteoporosis   . Other abnormal clinical finding   . Pain in joint, upper arm   . Peripheral vascular disease ([place])   . Rheumatoid arthritis ([place])    Dr Trudie Reed  . Tobacco use disorder   . Tremor    and gait disorder--Dr Jannifer Franklin  . Unspecified vitamin D deficiency    Past Surgical History:  Procedure Laterality Date  . APPENDECTOMY    . BACK SURGERY Bilateral    X2  . Bladder resuspension procedure    . CATARACT EXTRACTION Bilateral   . FEMUR IM NAIL Right 02/22/2014   Procedure: INTRAMEDULLARY (IM) NAIL FEMORAL;  Surgeon: Johnny Bridge, MD;  Location: [facility];  Service: Orthopedics;  Laterality: Right;  . INGUINAL HERNIA REPAIR Bilateral   . ORIF ANKLE FRACTURE Left 09/19/2016   Procedure:  OPEN REDUCTION INTERNAL FIXATION (ORIF) ANKLE FRACTURE;  Surgeon: Vickey Huger, MD;  Location: [facility];  Service: Orthopedics;  Laterality: Left;  . TONSILLECTOMY      Allergies  Allergen Reactions  . Novocain [Procaine]     Passed  out  . Limbrel [Flavocoxid]     Dizziness   . Sertraline Anxiety    Outpatient Encounter Medications as of 08/12/2018  Medication Sig  . acetaminophen (TYLENOL) 650 MG CR tablet Take 650 mg by mouth 2 (two) times daily.   Marland Kitchen alum & mag hydroxide-simeth (MAALOX/MYLANTA) 200-200-20 MG/5ML  suspension Take 30 mLs by mouth every 4 (four) hours as needed for indigestion or heartburn.  . Calcium Carbonate-Vitamin D (CALTRATE 600+D) 600-400 MG-UNIT per tablet Take 1 tablet by mouth daily.  . famotidine (PEPCID) 20 MG tablet Take 20 mg by mouth at bedtime.  Marland Kitchen Hyoscyamine Sulfate SL (LEVSIN/SL) 0.125 MG SUBL Place under the tongue every 12 (twelve) hours as needed (for cramps).   . LORazepam (ATIVAN) 0.5 MG tablet Take 1 tablet (0.5 mg total) by mouth 2 (two) times daily.  . memantine (NAMENDA) 5 MG tablet Take 5 mg by mouth 2 (two) times daily.  . metoprolol tartrate (LOPRESSOR) 25 MG tablet Take 12.5 mg by mouth 2 (two) times daily. Hold for SBP <110 or HR <60/min  . mirtazapine (REMERON) 15 MG tablet Take 15 mg by mouth at bedtime.   . Misc Natural Products (OSTEO BI-FLEX TRIPLE STRENGTH) TABS Take 1 tablet by mouth daily.  . Multiple Vitamin (MULTIVITAMIN) tablet Take 1 tablet by mouth daily.  . Nutritional Supplements (RESOURCE 2.0) LIQD Take 120 mLs by mouth 4 (four) times daily.   Marland Kitchen omeprazole (PRILOSEC) 20 MG capsule Take 20 mg by mouth daily.  . ondansetron (ZOFRAN) 4 MG tablet Take 4 mg by mouth every 6 (six) hours as needed for nausea or vomiting.  . polyethylene glycol (MIRALAX / GLYCOLAX) packet Take 17 g by mouth daily.  . potassium chloride (K-DUR,KLOR-CON) 10 MEQ tablet Take 10 mEq by mouth daily.  . QUEtiapine (SEROQUEL) 25 MG tablet Take 25 mg by mouth 2 (two) times daily. At 9AM and 1PM  . sennosides-docusate sodium (SENOKOT-S) 8.6-50 MG tablet Take 2 tablets by mouth daily.  Marland Kitchen torsemide (DEMADEX) 20 MG tablet Take 20 mg by mouth daily.  . [DISCONTINUED] memantine (NAMENDA) 10 MG tablet Take 10 mg by mouth 2 (two) times daily.  . [DISCONTINUED] silver sulfADIAZINE (SILVADENE) 1 % cream Apply 1 application topically daily. Apply to left lateral leg open area x4 weeks cover with a non adherent dressing   No facility-administered encounter medications on file as of  08/12/2018.     Review of Systems  Immunization History  Administered Date(s) Administered  . Influenza Whole 01/16/2010  . Influenza-Unspecified 02/03/2006, 01/09/2007, 01/05/2013, 12/22/2014, 12/28/2015, 12/26/2016, 12/29/2017  . PPD Test 05/01/2004, 02/24/2014  . Pneumococcal Polysaccharide-23 01/21/2006  . Pneumococcal-Unspecified 12/19/1998  . Td 03/18/1998  . Tdap 09/18/2016  . Zoster Recombinat (Shingrix) 02/11/2018, 04/20/2018   Pertinent  Health Maintenance Due  Topic Date Due  . INFLUENZA VACCINE  10/17/2018  . DEXA SCAN  Completed  . PNA vac Low Risk Adult  Completed   Fall Risk  11/28/2017 10/28/2016 06/20/2015  Falls in the past year? No No No  Risk for fall due to : - - Impaired balance/gait   Functional Status Survey:    Vitals:   08/12/18 1005  BP: 118/72  Pulse: 76  Resp: 18  Temp: (!) 97.2  F (36.2 C)  SpO2: 94%  Weight: 114 lb 4.8 oz (51.8 kg)  Height: 5\' 1"  (1.549 m)   Body mass index is 21.6 kg/m. Physical Exam  Labs reviewed: Recent Labs    09/08/17 09/25/17 07/09/18  NA 142 143  143 144  K 4.6 4.5  4.3 4.4  CL 104  --   --   CO2 31  --   --   BUN 37* 36* 26*  CREATININE 1.0 1.0  1.00 1.2*  CALCIUM 9.3 9.4  --    Recent Labs    09/08/17 07/09/18  AST 18 17  ALT 10 10  ALKPHOS 90  --   PROT 6.5  --   ALBUMIN 3.3  --    Recent Labs    09/02/17 09/08/17 07/09/18  WBC 7.4 9.6  --   HGB 14.1 13.2 13.7  HCT 42.4 40 42  MCV 92.6  --   --   PLT  --  263 319   Lab Results  Component Value Date   TSH 1.49 07/09/2018   Lab Results  Component Value Date   HGBA1C 5.8 12/16/2016   Lab Results  Component Value Date   CHOL 174 12/16/2016   CHOL 174 12/16/2016   HDL 68 12/16/2016   HDL 68 12/16/2016   LDLCALC 80 12/16/2016   LDLCALC 80 12/16/2016   TRIG 159 12/16/2016    Significant Diagnostic Results in last 30 days:  No results found.  Assessment/Plan There are no diagnoses linked to this encounter.   Family/ staff  Communication:   Labs/tests ordered:

## 2018-08-12 NOTE — Progress Notes (Signed)
Location:  [facility] Room Number: 32/A Place of Service:  SNF [phone]) Provider:    Virgie Dad, MD  Patient Care Team: Virgie Dad, MD as PCP - General (Internal Medicine) Jettie Booze, MD as Consulting Physician (Cardiology) Marchia Bond, MD as Consulting Physician (Orthopedic Surgery) Kathrynn Ducking, MD as Consulting Physician (Neurology) Brand Males, MD as Consulting Physician (Pulmonary Disease) Mast, Man X, NP as Nurse Practitioner (Internal Medicine) Ngetich, Nelda Bucks, NP as Nurse Practitioner (Family Medicine)  Extended Emergency Contact Information Primary Emergency Contact: [place] of [place] Phone: [phone] Relation: Son Secondary Emergency Contact: Ricardo Jericho [place] of [place] Phone: [phone] Mobile Phone: [phone] Relation: Daughter  Code Status:   Goals of care: Advanced Directive information Advanced Directives 08/12/2018  Does Patient Have a Medical Advance Directive? Yes  Type of Paramedic of [place];Living will;Out of facility DNR (pink MOST or yellow form)  Does patient want to make changes to medical advance directive? No - Patient declined  Copy of [place] in Chart? Yes - validated most recent copy scanned in chart (See row information)  Pre-existing out of facility DNR order (yellow form or pink MOST form) Yellow form placed in chart (order not valid for inpatient use);Pink MOST form placed in chart (order not valid for inpatient use)     Chief Complaint  Patient presents with  . Acute Visit    Discoloration of LE     HPI:  Pt is a 85 y.o. female seen today for an acute visit for discoloration of LE  Patient has h/o PVD, LE Chronic Venous stasis with Ulcers, Chronic Nausea, Failure to thrive , Cognitive impairment with Behavior Issues, h/o Left Ankle Fracture, Hypertension. Nurses noticed this morning  when patient was sitting in her wheelchair that both of her LE were Blue and Cold. They got her up in the bed and since then her legs are better. But now they have bilateral Redness Especially the Right side. She also has Healing Ulcer in Left LE. Patient says she just Hurt . No fever or chills. Denied any Nausea or Abdominal Pain   Past Medical History:  Diagnosis Date  . Alzheimer's disease ([place]) 06/22/2015  . Anemia    iron deficient  . Anemia   . Anxiety   . Chronic airway obstruction, not elsewhere classified   . Chronic back pain   . Chronic cystitis   . Cystitis, chronic    Dr Gaynelle Arabian  . Degenerative arthritis   . Depression   . Disturbance of skin sensation   . Disturbance of skin sensation   . Dyslipidemia   . Dyspnea    Dr Chase Caller  . Esophageal reflux   . Essential and other specified forms of tremor 12/11/2012  . Foot drop, bilateral 12/21/2013  . Gait disorder   . GERD (gastroesophageal reflux disease)   . Hemorrhoids   . Hereditary and idiopathic peripheral neuropathy 10/11/2015  . Hernia   . History of cerebrovascular disease    Moderate level small vessel disease  . Hyperlipidemia    mild  . Hypertension   . Osteoarthritis    right knee injection per Dr Trudie Reed  . Osteoporosis   . Other abnormal clinical finding   . Pain in joint, upper arm   . Peripheral vascular disease ([place])   . Rheumatoid arthritis ([place])    Dr Trudie Reed  . Tobacco use disorder   . Tremor  and gait disorder--Dr Jannifer Franklin  . Unspecified vitamin D deficiency    Past Surgical History:  Procedure Laterality Date  . APPENDECTOMY    . BACK SURGERY Bilateral    X2  . Bladder resuspension procedure    . CATARACT EXTRACTION Bilateral   . FEMUR IM NAIL Right 02/22/2014   Procedure: INTRAMEDULLARY (IM) NAIL FEMORAL;  Surgeon: Johnny Bridge, MD;  Location: [facility];  Service: Orthopedics;  Laterality: Right;  . INGUINAL HERNIA REPAIR Bilateral   . ORIF ANKLE FRACTURE Left 09/19/2016    Procedure: OPEN REDUCTION INTERNAL FIXATION (ORIF) ANKLE FRACTURE;  Surgeon: Vickey Huger, MD;  Location: [facility];  Service: Orthopedics;  Laterality: Left;  . TONSILLECTOMY      Allergies  Allergen Reactions  . Novocain [Procaine]     Passed  out  . Limbrel [Flavocoxid]     Dizziness   . Sertraline Anxiety    Outpatient Encounter Medications as of 08/12/2018  Medication Sig  . acetaminophen (TYLENOL) 650 MG CR tablet Take 650 mg by mouth 2 (two) times daily.   Marland Kitchen alum & mag hydroxide-simeth (MAALOX/MYLANTA) 200-200-20 MG/5ML suspension Take 30 mLs by mouth every 4 (four) hours as needed for indigestion or heartburn.  . Calcium Carbonate-Vitamin D (CALTRATE 600+D) 600-400 MG-UNIT per tablet Take 1 tablet by mouth daily.  . famotidine (PEPCID) 20 MG tablet Take 20 mg by mouth at bedtime.  Marland Kitchen Hyoscyamine Sulfate SL (LEVSIN/SL) 0.125 MG SUBL Place under the tongue every 12 (twelve) hours as needed (for cramps).   . LORazepam (ATIVAN) 0.5 MG tablet Take 1 tablet (0.5 mg total) by mouth 2 (two) times daily.  . memantine (NAMENDA) 5 MG tablet Take 5 mg by mouth 2 (two) times daily.  . metoprolol tartrate (LOPRESSOR) 25 MG tablet Take 12.5 mg by mouth 2 (two) times daily. Hold for SBP <110 or HR <60/min  . mirtazapine (REMERON) 15 MG tablet Take 15 mg by mouth at bedtime.   . Misc Natural Products (OSTEO BI-FLEX TRIPLE STRENGTH) TABS Take 1 tablet by mouth daily.  . Multiple Vitamin (MULTIVITAMIN) tablet Take 1 tablet by mouth daily.  . Nutritional Supplements (RESOURCE 2.0) LIQD Take 120 mLs by mouth 4 (four) times daily.   Marland Kitchen omeprazole (PRILOSEC) 20 MG capsule Take 20 mg by mouth daily.  . ondansetron (ZOFRAN) 4 MG tablet Take 4 mg by mouth every 6 (six) hours as needed for nausea or vomiting.  . polyethylene glycol (MIRALAX / GLYCOLAX) packet Take 17 g by mouth daily.  . potassium chloride (K-DUR,KLOR-CON) 10 MEQ tablet Take 10 mEq by mouth daily.  . QUEtiapine (SEROQUEL) 25 MG tablet Take 25  mg by mouth 2 (two) times daily. At 9AM and 1PM  . sennosides-docusate sodium (SENOKOT-S) 8.6-50 MG tablet Take 2 tablets by mouth daily.  Marland Kitchen torsemide (DEMADEX) 20 MG tablet Take 20 mg by mouth daily.  . [DISCONTINUED] memantine (NAMENDA) 10 MG tablet Take 10 mg by mouth 2 (two) times daily.  . [DISCONTINUED] silver sulfADIAZINE (SILVADENE) 1 % cream Apply 1 application topically daily. Apply to left lateral leg open area x4 weeks cover with a non adherent dressing   No facility-administered encounter medications on file as of 08/12/2018.     Review of Systems  Constitutional: Negative.   HENT: Negative.   Respiratory: Negative.   Cardiovascular: Negative.   Gastrointestinal: Negative.   Genitourinary: Negative.   Musculoskeletal: Positive for gait problem and myalgias.  Skin: Positive for color change and wound.  Neurological: Positive for  weakness.  Psychiatric/Behavioral: Negative.     Immunization History  Administered Date(s) Administered  . Influenza Whole 01/16/2010  . Influenza-Unspecified 02/03/2006, 01/09/2007, 01/05/2013, 12/22/2014, 12/28/2015, 12/26/2016, 12/29/2017  . PPD Test 05/01/2004, 02/24/2014  . Pneumococcal Polysaccharide-23 01/21/2006  . Pneumococcal-Unspecified 12/19/1998  . Td 03/18/1998  . Tdap 09/18/2016  . Zoster Recombinat (Shingrix) 02/11/2018, 04/20/2018   Pertinent  Health Maintenance Due  Topic Date Due  . INFLUENZA VACCINE  10/17/2018  . DEXA SCAN  Completed  . PNA vac Low Risk Adult  Completed   Fall Risk  11/28/2017 10/28/2016 06/20/2015  Falls in the past year? No No No  Risk for fall due to : - - Impaired balance/gait   Functional Status Survey:    Vitals:   08/12/18 1005  BP: 118/72  Pulse: 76  Resp: 18  Temp: (!) 97.2 F (36.2 C)  SpO2: 94%  Weight: 114 lb 4.8 oz (51.8 kg)  Height: 5\' 1"  (1.549 m)   Body mass index is 21.6 kg/m. Physical Exam Vitals signs reviewed.  HENT:     Head: Normocephalic.     Nose: Nose normal.      Mouth/Throat:     Mouth: Mucous membranes are moist.     Pharynx: Oropharynx is clear.  Eyes:     Pupils: Pupils are equal, round, and reactive to light.  Neck:     Musculoskeletal: Neck supple.  Cardiovascular:     Rate and Rhythm: Normal rate.     Pulses: Normal pulses.  Pulmonary:     Effort: Pulmonary effort is normal. No respiratory distress.     Breath sounds: Normal breath sounds. No wheezing or rales.  Abdominal:     General: Abdomen is flat. Bowel sounds are normal. There is no distension.     Palpations: Abdomen is soft.     Tenderness: There is no abdominal tenderness.  Musculoskeletal:     Comments: Has Redness in Both LE. Right More then Left. Also Has Chronic Ulcers healing in Left Lower Extremities Her Legs are not Warm. She has good pulses on Both LE. She does have some tenderness.   Skin:    General: Skin is warm and dry.  Neurological:     General: No focal deficit present.     Mental Status: She is alert.  Psychiatric:        Mood and Affect: Mood normal.        Thought Content: Thought content normal.     Labs reviewed: Recent Labs    09/08/17 09/25/17 07/09/18  NA 142 143  143 144  K 4.6 4.5  4.3 4.4  CL 104  --   --   CO2 31  --   --   BUN 37* 36* 26*  CREATININE 1.0 1.0  1.00 1.2*  CALCIUM 9.3 9.4  --    Recent Labs    09/08/17 07/09/18  AST 18 17  ALT 10 10  ALKPHOS 90  --   PROT 6.5  --   ALBUMIN 3.3  --    Recent Labs    09/02/17 09/08/17 07/09/18  WBC 7.4 9.6  --   HGB 14.1 13.2 13.7  HCT 42.4 40 42  MCV 92.6  --   --   PLT  --  263 319   Lab Results  Component Value Date   TSH 1.49 07/09/2018   Lab Results  Component Value Date   HGBA1C 5.8 12/16/2016   Lab Results  Component Value Date  CHOL 174 12/16/2016   CHOL 174 12/16/2016   HDL 68 12/16/2016   HDL 68 12/16/2016   LDLCALC 80 12/16/2016   LDLCALC 80 12/16/2016   TRIG 159 12/16/2016    Significant Diagnostic Results in last 30 days:  No results  found.  Assessment/Plan PVD D/W the Nurses. Her redness looks more like Chronic Changes then Cellulitis But will keep close eye on her Leg Will check CBC for Leucocytosis Discontinue Ted hoses Continue Elevation of LE. Stasis Ulcer of LE It looks better Healing Discontinue Silver Hydrogel QD  Essential Hypertension with LE edema On Low Dose of Lopressor and Demadex  Alzheimer's dementia with behavioral disturbance,  Continue on Namenda Also On Seroquel Depression, recurrent  On Remeron and Ativan   Gastroesophageal reflux disease  On Prilosec  Chronic nausea Continue Levisin, Pepcid, Zofran PRN  Family/ staff Communication:   Labs/tests ordered:  CBC  Total time spent in this patient care encounter was  25_  minutes; greater than 50% of the visit spent counseling patient and staff, reviewing records , Labs and coordinating care for problems addressed at this encounter.

## 2018-08-13 DIAGNOSIS — Z4789 Encounter for other orthopedic aftercare: Secondary | ICD-10-CM | POA: Diagnosis not present

## 2018-08-13 DIAGNOSIS — Z79899 Other long term (current) drug therapy: Secondary | ICD-10-CM | POA: Diagnosis not present

## 2018-08-13 DIAGNOSIS — S82892S Other fracture of left lower leg, sequela: Secondary | ICD-10-CM | POA: Diagnosis not present

## 2018-08-13 DIAGNOSIS — I2789 Other specified pulmonary heart diseases: Secondary | ICD-10-CM | POA: Diagnosis not present

## 2018-08-13 LAB — CBC AND DIFFERENTIAL
HCT: 47 — AB (ref 36–46)
Hemoglobin: 14.6 (ref 12.0–16.0)
Platelets: 274 (ref 150–399)

## 2018-08-18 ENCOUNTER — Encounter: Payer: Self-pay | Admitting: Nurse Practitioner

## 2018-08-18 ENCOUNTER — Non-Acute Institutional Stay (SKILLED_NURSING_FACILITY): Payer: Medicare Other | Admitting: Nurse Practitioner

## 2018-08-18 DIAGNOSIS — R40242 Glasgow coma scale score 9-12, unspecified time: Secondary | ICD-10-CM | POA: Diagnosis not present

## 2018-08-18 DIAGNOSIS — N183 Chronic kidney disease, stage 3 unspecified: Secondary | ICD-10-CM

## 2018-08-18 DIAGNOSIS — I129 Hypertensive chronic kidney disease with stage 1 through stage 4 chronic kidney disease, or unspecified chronic kidney disease: Secondary | ICD-10-CM | POA: Diagnosis not present

## 2018-08-18 DIAGNOSIS — F0281 Dementia in other diseases classified elsewhere with behavioral disturbance: Secondary | ICD-10-CM | POA: Diagnosis not present

## 2018-08-18 DIAGNOSIS — G309 Alzheimer's disease, unspecified: Secondary | ICD-10-CM | POA: Diagnosis not present

## 2018-08-18 DIAGNOSIS — R4182 Altered mental status, unspecified: Secondary | ICD-10-CM | POA: Insufficient documentation

## 2018-08-18 NOTE — Assessment & Plan Note (Signed)
Blood pressure is controlled. Continue Metoprolol.  

## 2018-08-18 NOTE — Assessment & Plan Note (Signed)
Advanced dementia, continue SNF [facility] for safety and care assistance

## 2018-08-18 NOTE — Assessment & Plan Note (Signed)
Gl[place] 9 in setting of advanced dementia. If HPOA consents: dc all current meds except Metoprolol, will have prn Lorazepam 0.5mg  q4hr, Tylenol 650mg  SP q6h prn. Hospice service, comfort measures.

## 2018-08-18 NOTE — Progress Notes (Signed)
Location:   SNF [facility] Room Number: 32/A Place of Service:  SNF (31) Provider: Lennie Odor Clancey Welton NP  Virgie Dad, MD  Patient Care Team: Virgie Dad, MD as PCP - General (Internal Medicine) Jettie Booze, MD as Consulting Physician (Cardiology) Marchia Bond, MD as Consulting Physician (Orthopedic Surgery) Kathrynn Ducking, MD as Consulting Physician (Neurology) Brand Males, MD as Consulting Physician (Pulmonary Disease) Dim Meisinger X, NP as Nurse Practitioner (Internal Medicine) Ngetich, Nelda Bucks, NP as Nurse Practitioner (Family Medicine)  Extended Emergency Contact Information Primary Emergency Contact: [place] of [place] Phone: [phone] Relation: Son Secondary Emergency Contact: Ricardo Jericho [place] of [place] Phone: [phone] Mobile Phone: [phone] Relation: Daughter  Code Status:  DNR Goals of care: Advanced Directive information Advanced Directives 08/18/2018  Does Patient Have a Medical Advance Directive? Yes  Type of Paramedic of [place];Living will;Out of facility DNR (pink MOST or yellow form)  Does patient want to make changes to medical advance directive? No - Patient declined  Copy of [place] in Chart? Yes - validated most recent copy scanned in chart (See row information)  Pre-existing out of facility DNR order (yellow form or pink MOST form) Pink MOST form placed in chart (order not valid for inpatient use);Yellow form placed in chart (order not valid for inpatient use)     Chief Complaint  Patient presents with  . Acute Visit    Unable to oral intake, altered mental status     HPI:  Pt is a 85 y.o. female seen today for an acute visit for altered mental status-only responded to pain stimuli with grimace and mumbles, unable to take food or meds by mouth. Her blood pressure is control, on Metoprolol. Hx of advanced dementia with  behavioral disturbance, on Seroquel, Remeron, Namenda, Ativan.   Past Medical History:  Diagnosis Date  . Alzheimer's disease ([place]) 06/22/2015  . Anemia    iron deficient  . Anemia   . Anxiety   . Chronic airway obstruction, not elsewhere classified   . Chronic back pain   . Chronic cystitis   . Cystitis, chronic    Dr Gaynelle Arabian  . Degenerative arthritis   . Depression   . Disturbance of skin sensation   . Disturbance of skin sensation   . Dyslipidemia   . Dyspnea    Dr Chase Caller  . Esophageal reflux   . Essential and other specified forms of tremor 12/11/2012  . Foot drop, bilateral 12/21/2013  . Gait disorder   . GERD (gastroesophageal reflux disease)   . Hemorrhoids   . Hereditary and idiopathic peripheral neuropathy 10/11/2015  . Hernia   . History of cerebrovascular disease    Moderate level small vessel disease  . Hyperlipidemia    mild  . Hypertension   . Osteoarthritis    right knee injection per Dr Trudie Reed  . Osteoporosis   . Other abnormal clinical finding   . Pain in joint, upper arm   . Peripheral vascular disease ([place])   . Rheumatoid arthritis ([place])    Dr Trudie Reed  . Tobacco use disorder   . Tremor    and gait disorder--Dr Jannifer Franklin  . Unspecified vitamin D deficiency    Past Surgical History:  Procedure Laterality Date  . APPENDECTOMY    . BACK SURGERY Bilateral    X2  . Bladder resuspension procedure    . CATARACT EXTRACTION Bilateral   . FEMUR IM NAIL Right  02/22/2014   Procedure: INTRAMEDULLARY (IM) NAIL FEMORAL;  Surgeon: Johnny Bridge, MD;  Location: [facility];  Service: Orthopedics;  Laterality: Right;  . INGUINAL HERNIA REPAIR Bilateral   . ORIF ANKLE FRACTURE Left 09/19/2016   Procedure: OPEN REDUCTION INTERNAL FIXATION (ORIF) ANKLE FRACTURE;  Surgeon: Vickey Huger, MD;  Location: [facility];  Service: Orthopedics;  Laterality: Left;  . TONSILLECTOMY      Allergies  Allergen Reactions  . Novocain [Procaine]     Passed  out  . Limbrel [Flavocoxid]      Dizziness   . Sertraline Anxiety    Allergies as of 08/18/2018      Reactions   Novocain [procaine]    Passed  out   Limbrel [flavocoxid]    Dizziness   Sertraline Anxiety      Medication List       Accurate as of August 18, 2018 11:59 PM. If you have any questions, ask your nurse or doctor.        acetaminophen 650 MG CR tablet Commonly known as:  TYLENOL Take 650 mg by mouth 2 (two) times daily.   alum & mag hydroxide-simeth 200-200-20 MG/5ML suspension Commonly known as:  MAALOX/MYLANTA Take 30 mLs by mouth every 4 (four) hours as needed for indigestion or heartburn.   Caltrate 600+D 600-400 MG-UNIT tablet Generic drug:  Calcium Carbonate-Vitamin D Take 1 tablet by mouth daily.   famotidine 20 MG tablet Commonly known as:  PEPCID Take 20 mg by mouth at bedtime.   Levsin/SL 0.125 MG Subl Generic drug:  Hyoscyamine Sulfate SL Place under the tongue every 12 (twelve) hours as needed (for cramps).   LORazepam 0.5 MG tablet Commonly known as:  ATIVAN Take 1 tablet (0.5 mg total) by mouth 2 (two) times daily.   memantine 5 MG tablet Commonly known as:  NAMENDA Take 5 mg by mouth 2 (two) times daily.   metoprolol tartrate 25 MG tablet Commonly known as:  LOPRESSOR Take 12.5 mg by mouth 2 (two) times daily. Hold for SBP <110 or HR <60/min   mirtazapine 15 MG tablet Commonly known as:  REMERON Take 15 mg by mouth at bedtime.   multivitamin tablet Take 1 tablet by mouth daily.   omeprazole 20 MG capsule Commonly known as:  PRILOSEC Take 20 mg by mouth daily.   ondansetron 4 MG tablet Commonly known as:  ZOFRAN Take 4 mg by mouth every 6 (six) hours as needed for nausea or vomiting.   Osteo Bi-Flex Triple Strength Tabs Take 1 tablet by mouth daily.   polyethylene glycol 17 g packet Commonly known as:  MIRALAX / GLYCOLAX Take 17 g by mouth daily.   potassium chloride 10 MEQ tablet Commonly known as:  K-DUR Take 10 mEq by mouth daily.   QUEtiapine 25  MG tablet Commonly known as:  SEROQUEL Take 25 mg by mouth 2 (two) times daily. At 9AM and 1PM   Resource 2.0 Liqd Take 120 mLs by mouth 4 (four) times daily.   sennosides-docusate sodium 8.6-50 MG tablet Commonly known as:  SENOKOT-S Take 2 tablets by mouth daily.   torsemide 20 MG tablet Commonly known as:  DEMADEX Take 20 mg by mouth daily.      ROS was provided with assistance of staff Review of Systems  Constitutional: Positive for activity change and appetite change.  HENT: Positive for trouble swallowing.   Respiratory: Negative for cough, shortness of breath and wheezing.   Cardiovascular: Negative for leg swelling.  Gastrointestinal:  Negative for abdominal distention and abdominal pain.  Genitourinary: Negative for difficulty urinating and dysuria.  Neurological:       Dementia. Eye opening to pain, incomprehensible sounds, moves to localized pain, total [place] is 9/15, near comatose.     Immunization History  Administered Date(s) Administered  . Influenza Whole 01/16/2010  . Influenza-Unspecified 02/03/2006, 01/09/2007, 01/05/2013, 12/22/2014, 12/28/2015, 12/26/2016, 12/29/2017  . PPD Test 05/01/2004, 02/24/2014  . Pneumococcal Polysaccharide-23 01/21/2006  . Pneumococcal-Unspecified 12/19/1998  . Td 03/18/1998  . Tdap 09/18/2016  . Zoster Recombinat (Shingrix) 02/11/2018, 04/20/2018   Pertinent  Health Maintenance Due  Topic Date Due  . INFLUENZA VACCINE  10/17/2018  . DEXA SCAN  Completed  . PNA vac Low Risk Adult  Completed   Fall Risk  11/28/2017 10/28/2016 06/20/2015  Falls in the past year? No No No  Risk for fall due to : - - Impaired balance/gait   Functional Status Survey:    Vitals:   08/18/18 1622  BP: 104/70  Pulse: 95  Resp: 18  Temp: (!) 97.2 F (36.2 C)  SpO2: 92%  Weight: 105 lb (47.6 kg)  Height: 5\' 1"  (1.549 m)   Body mass index is 19.84 kg/m. Physical Exam Constitutional:      Comments: Only moves with pain stimuli  HENT:      Head: Normocephalic and atraumatic.     Nose: Nose normal.     Mouth/Throat:     Mouth: Mucous membranes are dry.  Eyes:     Comments:  Sluggish pupil reaction to light R+L  Neck:     Musculoskeletal: Normal range of motion and neck supple.  Cardiovascular:     Rate and Rhythm: Normal rate and regular rhythm.     Heart sounds: No murmur.  Pulmonary:     Effort: Pulmonary effort is normal.     Breath sounds: No wheezing, rhonchi or rales.  Abdominal:     General: There is no distension.     Palpations: Abdomen is soft.     Tenderness: There is no abdominal tenderness. There is no right CVA tenderness, left CVA tenderness or guarding.  Musculoskeletal:     Right lower leg: No edema.     Left lower leg: No edema.  Skin:    General: Skin is warm and dry.  Neurological:     General: No focal deficit present.     Cranial Nerves: No cranial nerve deficit.     Labs reviewed: Recent Labs    09/08/17 09/25/17 07/09/18  NA 142 143  143 144  K 4.6 4.5  4.3 4.4  CL 104  --   --   CO2 31  --   --   BUN 37* 36* 26*  CREATININE 1.0 1.0  1.00 1.2*  CALCIUM 9.3 9.4  --    Recent Labs    09/08/17 07/09/18  AST 18 17  ALT 10 10  ALKPHOS 90  --   PROT 6.5  --   ALBUMIN 3.3  --    Recent Labs    09/02/17 09/08/17 07/09/18 08/13/18  WBC 7.4 9.6  --   --   HGB 14.1 13.2 13.7 14.6  HCT 42.4 40 42 47*  MCV 92.6  --   --   --   PLT  --  263 319 274   Lab Results  Component Value Date   TSH 1.49 07/09/2018   Lab Results  Component Value Date   HGBA1C 5.8 12/16/2016   Lab Results  Component Value Date   CHOL 174 12/16/2016   CHOL 174 12/16/2016   HDL 68 12/16/2016   HDL 68 12/16/2016   LDLCALC 80 12/16/2016   LDLCALC 80 12/16/2016   TRIG 159 12/16/2016    Significant Diagnostic Results in last 30 days:  No results found.  Assessment/Plan Altered mental status Glasgow 9 in setting of advanced dementia. If HPOA consents: dc all current meds except Metoprolol,  will have prn Lorazepam 0.5mg  q4hr, Tylenol 650mg  SP q6h prn. Hospice service, comfort measures.   Alzheimer's dementia with behavioral disturbance Advanced dementia, continue SNF [facility] for safety and care assistance  Benign hypertension with chronic kidney disease, stage III ([place]) Blood pressure is controlled. Continue Metoprolol.     Family/ staff Communication: plan of care reviewed with the patient and charge nurse.   Labs/tests ordered: none  Time spend 25 minutes.

## 2018-08-19 DIAGNOSIS — Z681 Body mass index (BMI) 19 or less, adult: Secondary | ICD-10-CM | POA: Diagnosis not present

## 2018-08-19 DIAGNOSIS — Z8673 Personal history of transient ischemic attack (TIA), and cerebral infarction without residual deficits: Secondary | ICD-10-CM | POA: Diagnosis not present

## 2018-08-19 DIAGNOSIS — M21379 Foot drop, unspecified foot: Secondary | ICD-10-CM | POA: Diagnosis not present

## 2018-08-19 DIAGNOSIS — I129 Hypertensive chronic kidney disease with stage 1 through stage 4 chronic kidney disease, or unspecified chronic kidney disease: Secondary | ICD-10-CM | POA: Diagnosis not present

## 2018-08-19 DIAGNOSIS — M4800 Spinal stenosis, site unspecified: Secondary | ICD-10-CM | POA: Diagnosis not present

## 2018-08-19 DIAGNOSIS — K219 Gastro-esophageal reflux disease without esophagitis: Secondary | ICD-10-CM | POA: Diagnosis not present

## 2018-08-19 DIAGNOSIS — E46 Unspecified protein-calorie malnutrition: Secondary | ICD-10-CM | POA: Diagnosis not present

## 2018-08-19 DIAGNOSIS — Z7401 Bed confinement status: Secondary | ICD-10-CM | POA: Diagnosis not present

## 2018-08-19 DIAGNOSIS — E785 Hyperlipidemia, unspecified: Secondary | ICD-10-CM | POA: Diagnosis not present

## 2018-08-19 DIAGNOSIS — R630 Anorexia: Secondary | ICD-10-CM | POA: Diagnosis not present

## 2018-08-19 DIAGNOSIS — L97929 Non-pressure chronic ulcer of unspecified part of left lower leg with unspecified severity: Secondary | ICD-10-CM | POA: Diagnosis not present

## 2018-08-19 DIAGNOSIS — R159 Full incontinence of feces: Secondary | ICD-10-CM | POA: Diagnosis not present

## 2018-08-19 DIAGNOSIS — I83029 Varicose veins of left lower extremity with ulcer of unspecified site: Secondary | ICD-10-CM | POA: Diagnosis not present

## 2018-08-19 DIAGNOSIS — F0281 Dementia in other diseases classified elsewhere with behavioral disturbance: Secondary | ICD-10-CM | POA: Diagnosis not present

## 2018-08-19 DIAGNOSIS — Z741 Need for assistance with personal care: Secondary | ICD-10-CM | POA: Diagnosis not present

## 2018-08-19 DIAGNOSIS — N3020 Other chronic cystitis without hematuria: Secondary | ICD-10-CM | POA: Diagnosis not present

## 2018-08-19 DIAGNOSIS — G309 Alzheimer's disease, unspecified: Secondary | ICD-10-CM | POA: Diagnosis not present

## 2018-08-19 DIAGNOSIS — G250 Essential tremor: Secondary | ICD-10-CM | POA: Diagnosis not present

## 2018-08-19 DIAGNOSIS — R32 Unspecified urinary incontinence: Secondary | ICD-10-CM | POA: Diagnosis not present

## 2018-08-19 DIAGNOSIS — F418 Other specified anxiety disorders: Secondary | ICD-10-CM | POA: Diagnosis not present

## 2018-08-19 DIAGNOSIS — G629 Polyneuropathy, unspecified: Secondary | ICD-10-CM | POA: Diagnosis not present

## 2018-08-19 DIAGNOSIS — M069 Rheumatoid arthritis, unspecified: Secondary | ICD-10-CM | POA: Diagnosis not present

## 2018-08-19 DIAGNOSIS — N183 Chronic kidney disease, stage 3 (moderate): Secondary | ICD-10-CM | POA: Diagnosis not present

## 2018-08-19 DIAGNOSIS — M1990 Unspecified osteoarthritis, unspecified site: Secondary | ICD-10-CM | POA: Diagnosis not present

## 2018-08-20 DIAGNOSIS — F0281 Dementia in other diseases classified elsewhere with behavioral disturbance: Secondary | ICD-10-CM | POA: Diagnosis not present

## 2018-08-20 DIAGNOSIS — G309 Alzheimer's disease, unspecified: Secondary | ICD-10-CM | POA: Diagnosis not present

## 2018-08-20 DIAGNOSIS — I129 Hypertensive chronic kidney disease with stage 1 through stage 4 chronic kidney disease, or unspecified chronic kidney disease: Secondary | ICD-10-CM | POA: Diagnosis not present

## 2018-08-20 DIAGNOSIS — N183 Chronic kidney disease, stage 3 (moderate): Secondary | ICD-10-CM | POA: Diagnosis not present

## 2018-08-20 DIAGNOSIS — F418 Other specified anxiety disorders: Secondary | ICD-10-CM | POA: Diagnosis not present

## 2018-08-20 DIAGNOSIS — E46 Unspecified protein-calorie malnutrition: Secondary | ICD-10-CM | POA: Diagnosis not present

## 2018-08-21 ENCOUNTER — Telehealth: Payer: Self-pay | Admitting: *Deleted

## 2018-08-21 DIAGNOSIS — N183 Chronic kidney disease, stage 3 (moderate): Secondary | ICD-10-CM | POA: Diagnosis not present

## 2018-08-21 DIAGNOSIS — F0281 Dementia in other diseases classified elsewhere with behavioral disturbance: Secondary | ICD-10-CM | POA: Diagnosis not present

## 2018-08-21 DIAGNOSIS — E46 Unspecified protein-calorie malnutrition: Secondary | ICD-10-CM | POA: Diagnosis not present

## 2018-08-21 DIAGNOSIS — G309 Alzheimer's disease, unspecified: Secondary | ICD-10-CM | POA: Diagnosis not present

## 2018-08-21 DIAGNOSIS — F418 Other specified anxiety disorders: Secondary | ICD-10-CM | POA: Diagnosis not present

## 2018-08-21 DIAGNOSIS — I129 Hypertensive chronic kidney disease with stage 1 through stage 4 chronic kidney disease, or unspecified chronic kidney disease: Secondary | ICD-10-CM | POA: Diagnosis not present

## 2018-08-21 NOTE — Telephone Encounter (Signed)
Medication list updated.   Pended Rx and sent to [facility] for approval.

## 2018-08-21 NOTE — Telephone Encounter (Signed)
Received fax from [facility] regarding a Hard Script Refill for Roxanol 20mg /mL .25 ml every 2 hours as needed for pain/dyspena.   Medication is not in current medication list nor in last OV note. Please Advise.

## 2018-09-16 DEATH — deceased
